# Patient Record
Sex: Male | Born: 1945 | Race: White | Hispanic: No | Marital: Married | Smoking: Former smoker
Health system: Southern US, Community
[De-identification: ages and names within clinical notes are randomized; demographics above are authoritative.]

## PROBLEM LIST (undated history)

## (undated) DIAGNOSIS — K589 Irritable bowel syndrome without diarrhea: Secondary | ICD-10-CM

## (undated) DIAGNOSIS — D649 Anemia, unspecified: Secondary | ICD-10-CM

## (undated) DIAGNOSIS — M9902 Segmental and somatic dysfunction of thoracic region: Secondary | ICD-10-CM

## (undated) DIAGNOSIS — M545 Low back pain, unspecified: Secondary | ICD-10-CM

## (undated) DIAGNOSIS — IMO0001 Reserved for inherently not codable concepts without codable children: Secondary | ICD-10-CM

## (undated) DIAGNOSIS — K219 Gastro-esophageal reflux disease without esophagitis: Secondary | ICD-10-CM

## (undated) DIAGNOSIS — Z531 Procedure and treatment not carried out because of patient's decision for reasons of belief and group pressure: Secondary | ICD-10-CM

## (undated) DIAGNOSIS — C20 Malignant neoplasm of rectum: Secondary | ICD-10-CM

## (undated) DIAGNOSIS — M542 Cervicalgia: Secondary | ICD-10-CM

## (undated) DIAGNOSIS — E785 Hyperlipidemia, unspecified: Secondary | ICD-10-CM

## (undated) DIAGNOSIS — K8020 Calculus of gallbladder without cholecystitis without obstruction: Secondary | ICD-10-CM

## (undated) DIAGNOSIS — R319 Hematuria, unspecified: Secondary | ICD-10-CM

## (undated) DIAGNOSIS — E0590 Thyrotoxicosis, unspecified without thyrotoxic crisis or storm: Secondary | ICD-10-CM

## (undated) DIAGNOSIS — Z9289 Personal history of other medical treatment: Secondary | ICD-10-CM

## (undated) DIAGNOSIS — M9903 Segmental and somatic dysfunction of lumbar region: Secondary | ICD-10-CM

## (undated) DIAGNOSIS — S39013A Strain of muscle, fascia and tendon of pelvis, initial encounter: Secondary | ICD-10-CM

## (undated) DIAGNOSIS — M1990 Unspecified osteoarthritis, unspecified site: Secondary | ICD-10-CM

## (undated) DIAGNOSIS — M47816 Spondylosis without myelopathy or radiculopathy, lumbar region: Secondary | ICD-10-CM

## (undated) DIAGNOSIS — H3530 Unspecified macular degeneration: Secondary | ICD-10-CM

## (undated) DIAGNOSIS — T7840XA Allergy, unspecified, initial encounter: Secondary | ICD-10-CM

## (undated) DIAGNOSIS — F3289 Other specified depressive episodes: Secondary | ICD-10-CM

## (undated) DIAGNOSIS — M9901 Segmental and somatic dysfunction of cervical region: Secondary | ICD-10-CM

## (undated) DIAGNOSIS — T8859XA Other complications of anesthesia, initial encounter: Secondary | ICD-10-CM

## (undated) DIAGNOSIS — E162 Hypoglycemia, unspecified: Secondary | ICD-10-CM

## (undated) DIAGNOSIS — R972 Elevated prostate specific antigen [PSA]: Secondary | ICD-10-CM

## (undated) DIAGNOSIS — T4145XA Adverse effect of unspecified anesthetic, initial encounter: Secondary | ICD-10-CM

## (undated) DIAGNOSIS — I10 Essential (primary) hypertension: Secondary | ICD-10-CM

## (undated) DIAGNOSIS — K5790 Diverticulosis of intestine, part unspecified, without perforation or abscess without bleeding: Secondary | ICD-10-CM

## (undated) DIAGNOSIS — Z8669 Personal history of other diseases of the nervous system and sense organs: Secondary | ICD-10-CM

## (undated) DIAGNOSIS — F329 Major depressive disorder, single episode, unspecified: Secondary | ICD-10-CM

## (undated) DIAGNOSIS — K4090 Unilateral inguinal hernia, without obstruction or gangrene, not specified as recurrent: Secondary | ICD-10-CM

## (undated) DIAGNOSIS — M9904 Segmental and somatic dysfunction of sacral region: Secondary | ICD-10-CM

## (undated) DIAGNOSIS — A159 Respiratory tuberculosis unspecified: Secondary | ICD-10-CM

## (undated) DIAGNOSIS — N281 Cyst of kidney, acquired: Secondary | ICD-10-CM

## (undated) DIAGNOSIS — H269 Unspecified cataract: Secondary | ICD-10-CM

## (undated) DIAGNOSIS — F419 Anxiety disorder, unspecified: Secondary | ICD-10-CM

## (undated) DIAGNOSIS — N419 Inflammatory disease of prostate, unspecified: Secondary | ICD-10-CM

## (undated) DIAGNOSIS — E278 Other specified disorders of adrenal gland: Secondary | ICD-10-CM

## (undated) HISTORY — DX: Segmental and somatic dysfunction of cervical region: M99.01

## (undated) HISTORY — DX: Unilateral inguinal hernia, without obstruction or gangrene, not specified as recurrent: K40.90

## (undated) HISTORY — DX: Low back pain, unspecified: M54.50

## (undated) HISTORY — DX: Other specified depressive episodes: F32.89

## (undated) HISTORY — DX: Strain of muscle, fascia and tendon of pelvis, initial encounter: S39.013A

## (undated) HISTORY — DX: Segmental and somatic dysfunction of thoracic region: M99.02

## (undated) HISTORY — DX: Irritable bowel syndrome, unspecified: K58.9

## (undated) HISTORY — DX: Segmental and somatic dysfunction of lumbar region: M99.03

## (undated) HISTORY — DX: Elevated prostate specific antigen (PSA): R97.20

## (undated) HISTORY — DX: Respiratory tuberculosis unspecified: A15.9

## (undated) HISTORY — DX: Unspecified osteoarthritis, unspecified site: M19.90

## (undated) HISTORY — DX: Anxiety disorder, unspecified: F41.9

## (undated) HISTORY — DX: Major depressive disorder, single episode, unspecified: F32.9

## (undated) HISTORY — DX: Other specified disorders of adrenal gland: E27.8

## (undated) HISTORY — PX: CYSTOSCOPY: SUR368

## (undated) HISTORY — PX: CATARACT EXTRACTION, BILATERAL: SHX1313

## (undated) HISTORY — DX: Allergy, unspecified, initial encounter: T78.40XA

## (undated) HISTORY — DX: Personal history of other diseases of the nervous system and sense organs: Z86.69

## (undated) HISTORY — DX: Essential (primary) hypertension: I10

## (undated) HISTORY — DX: Hyperlipidemia, unspecified: E78.5

## (undated) HISTORY — DX: Inflammatory disease of prostate, unspecified: N41.9

## (undated) HISTORY — DX: Segmental and somatic dysfunction of sacral region: M99.04

## (undated) HISTORY — DX: Unspecified macular degeneration: H35.30

## (undated) HISTORY — DX: Hematuria, unspecified: R31.9

## (undated) HISTORY — DX: Spondylosis without myelopathy or radiculopathy, lumbar region: M47.816

## (undated) HISTORY — DX: Cyst of kidney, acquired: N28.1

## (undated) HISTORY — PX: TONSILLECTOMY: SHX5217

## (undated) HISTORY — DX: Calculus of gallbladder without cholecystitis without obstruction: K80.20

## (undated) HISTORY — DX: Diverticulosis of intestine, part unspecified, without perforation or abscess without bleeding: K57.90

## (undated) HISTORY — DX: Cervicalgia: M54.2

## (undated) HISTORY — DX: Unspecified cataract: H26.9

---
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
## 1992-07-15 HISTORY — PX: KNEE ARTHROSCOPY: SUR90

## 1999-06-05 ENCOUNTER — Encounter: Payer: Self-pay | Admitting: Internal Medicine

## 1999-06-05 ENCOUNTER — Ambulatory Visit (HOSPITAL_COMMUNITY): Admission: RE | Admit: 1999-06-05 | Discharge: 1999-06-05 | Payer: Self-pay | Admitting: Internal Medicine

## 2000-03-26 ENCOUNTER — Inpatient Hospital Stay (HOSPITAL_COMMUNITY): Admission: RE | Admit: 2000-03-26 | Discharge: 2000-03-29 | Payer: Self-pay | Admitting: Urology

## 2000-03-26 ENCOUNTER — Encounter (INDEPENDENT_AMBULATORY_CARE_PROVIDER_SITE_OTHER): Payer: Self-pay

## 2001-07-15 HISTORY — PX: OTHER SURGICAL HISTORY: SHX169

## 2004-08-20 ENCOUNTER — Ambulatory Visit: Payer: Self-pay | Admitting: Internal Medicine

## 2005-01-11 ENCOUNTER — Ambulatory Visit: Payer: Self-pay | Admitting: Internal Medicine

## 2005-01-17 ENCOUNTER — Ambulatory Visit: Payer: Self-pay | Admitting: Internal Medicine

## 2005-01-31 ENCOUNTER — Ambulatory Visit: Payer: Self-pay | Admitting: Internal Medicine

## 2006-07-02 ENCOUNTER — Ambulatory Visit: Payer: Self-pay | Admitting: Internal Medicine

## 2006-07-02 ENCOUNTER — Encounter (INDEPENDENT_AMBULATORY_CARE_PROVIDER_SITE_OTHER): Payer: Self-pay | Admitting: *Deleted

## 2006-07-02 LAB — CONVERTED CEMR LAB
Chol/HDL Ratio, serum: 5.5
Cholesterol: 287 mg/dL (ref 0–200)
HDL: 52.6 mg/dL (ref >39.0)
Hgb A1c MFr Bld: 5.3 % (ref 4.6–6.0)
LDL DIRECT: 231.9 mg/dL
PSA: 4.76 ng/mL
PSA: 4.76 ng/mL — ABNORMAL HIGH (ref 0.10–4.00)
Triglyceride fasting, serum: 75 mg/dL (ref 0–149)
VLDL: 15 mg/dL (ref 0–40)

## 2006-07-15 DIAGNOSIS — R972 Elevated prostate specific antigen [PSA]: Secondary | ICD-10-CM

## 2006-07-15 DIAGNOSIS — N419 Inflammatory disease of prostate, unspecified: Secondary | ICD-10-CM

## 2006-07-15 HISTORY — DX: Inflammatory disease of prostate, unspecified: N41.9

## 2006-07-15 HISTORY — DX: Elevated prostate specific antigen (PSA): R97.20

## 2006-07-16 ENCOUNTER — Ambulatory Visit: Payer: Self-pay

## 2006-07-31 ENCOUNTER — Ambulatory Visit: Payer: Self-pay | Admitting: Internal Medicine

## 2006-10-07 ENCOUNTER — Ambulatory Visit: Payer: Self-pay | Admitting: Internal Medicine

## 2006-10-07 LAB — CONVERTED CEMR LAB
ALT: 18 units/L (ref 0–40)
AST: 20 units/L (ref 0–37)
Cholesterol: 266 mg/dL (ref 0–200)
Direct LDL: 202.7 mg/dL
HDL: 49.8 mg/dL (ref >39.0)
Total CHOL/HDL Ratio: 5.3
Triglycerides: 89 mg/dL (ref 0–149)
VLDL: 18 mg/dL (ref 0–40)

## 2007-06-29 ENCOUNTER — Encounter (INDEPENDENT_AMBULATORY_CARE_PROVIDER_SITE_OTHER): Payer: Self-pay | Admitting: *Deleted

## 2007-06-29 DIAGNOSIS — M1990 Unspecified osteoarthritis, unspecified site: Secondary | ICD-10-CM | POA: Insufficient documentation

## 2007-06-29 DIAGNOSIS — Z87448 Personal history of other diseases of urinary system: Secondary | ICD-10-CM

## 2007-06-29 DIAGNOSIS — H3530 Unspecified macular degeneration: Secondary | ICD-10-CM | POA: Insufficient documentation

## 2007-06-29 DIAGNOSIS — E785 Hyperlipidemia, unspecified: Secondary | ICD-10-CM | POA: Insufficient documentation

## 2007-06-29 DIAGNOSIS — R972 Elevated prostate specific antigen [PSA]: Secondary | ICD-10-CM

## 2007-06-29 DIAGNOSIS — F329 Major depressive disorder, single episode, unspecified: Secondary | ICD-10-CM

## 2007-07-16 HISTORY — PX: TOTAL KNEE ARTHROPLASTY: SHX125

## 2007-10-07 ENCOUNTER — Telehealth (INDEPENDENT_AMBULATORY_CARE_PROVIDER_SITE_OTHER): Payer: Self-pay | Admitting: *Deleted

## 2007-10-15 ENCOUNTER — Ambulatory Visit: Payer: Self-pay | Admitting: Internal Medicine

## 2007-10-15 DIAGNOSIS — F411 Generalized anxiety disorder: Secondary | ICD-10-CM

## 2007-11-24 ENCOUNTER — Ambulatory Visit: Payer: Self-pay | Admitting: Internal Medicine

## 2007-11-24 DIAGNOSIS — J301 Allergic rhinitis due to pollen: Secondary | ICD-10-CM

## 2008-09-21 ENCOUNTER — Encounter: Payer: Self-pay | Admitting: Internal Medicine

## 2009-02-01 ENCOUNTER — Ambulatory Visit: Payer: Self-pay | Admitting: Internal Medicine

## 2009-05-08 ENCOUNTER — Ambulatory Visit: Payer: Self-pay | Admitting: Internal Medicine

## 2009-05-08 DIAGNOSIS — L0390 Cellulitis, unspecified: Secondary | ICD-10-CM

## 2009-05-08 DIAGNOSIS — L0291 Cutaneous abscess, unspecified: Secondary | ICD-10-CM

## 2009-08-17 ENCOUNTER — Ambulatory Visit: Payer: Self-pay | Admitting: Internal Medicine

## 2009-08-17 DIAGNOSIS — J069 Acute upper respiratory infection, unspecified: Secondary | ICD-10-CM | POA: Insufficient documentation

## 2009-08-17 DIAGNOSIS — I10 Essential (primary) hypertension: Secondary | ICD-10-CM | POA: Insufficient documentation

## 2009-09-15 ENCOUNTER — Telehealth: Payer: Self-pay | Admitting: Internal Medicine

## 2009-12-20 ENCOUNTER — Ambulatory Visit: Payer: Self-pay | Admitting: Internal Medicine

## 2009-12-20 DIAGNOSIS — H6120 Impacted cerumen, unspecified ear: Secondary | ICD-10-CM

## 2009-12-20 DIAGNOSIS — H906 Mixed conductive and sensorineural hearing loss, bilateral: Secondary | ICD-10-CM | POA: Insufficient documentation

## 2010-01-10 ENCOUNTER — Telehealth (INDEPENDENT_AMBULATORY_CARE_PROVIDER_SITE_OTHER): Payer: Self-pay | Admitting: *Deleted

## 2010-04-18 ENCOUNTER — Telehealth (INDEPENDENT_AMBULATORY_CARE_PROVIDER_SITE_OTHER): Payer: Self-pay | Admitting: *Deleted

## 2010-04-19 ENCOUNTER — Encounter (INDEPENDENT_AMBULATORY_CARE_PROVIDER_SITE_OTHER): Payer: Self-pay | Admitting: *Deleted

## 2010-04-20 ENCOUNTER — Telehealth (INDEPENDENT_AMBULATORY_CARE_PROVIDER_SITE_OTHER): Payer: Self-pay | Admitting: *Deleted

## 2010-07-15 HISTORY — PX: COLONOSCOPY: SHX174

## 2010-08-14 NOTE — Progress Notes (Signed)
Summary: Refill Request  Phone Note Refill Request Call back at [phone] Message from:  Pharmacy on April 20, 2010 10:34 AM  Refills Requested: Medication #1:  AMLODIPINE BESYLATE 5 MG TABS 1 once daily   Dosage confirmed as above?Dosage Confirmed   Brand Name Necessary? No   Supply Requested: 1 month   Last Refilled: 03/13/2010 [facility] [address] [facility] [place]  Next Appointment Scheduled: none Initial call taken by: Harold Barban,  April 20, 2010 10:35 AM    Prescriptions: AMLODIPINE BESYLATE 5 MG TABS (AMLODIPINE BESYLATE) 1 once daily  #30 x 5   Entered by:   Shonna Chock CMA   Authorized by:   Marga Melnick MD   Signed by:   Shonna Chock CMA on 04/20/2010   Method used:   Electronically to        [facility] [phone]* (retail)       [address] [place], [place]  [address]       Ph: [phone]       Fax: [phone]   RxID:   [id number]

## 2010-08-14 NOTE — Assessment & Plan Note (Signed)
Summary: ACUTE/SINUS/ALR  NS ALR   Vital Signs:  Patient profile:   65 year old male Weight:      204.6 pounds Temp:     98.4 degrees F oral Pulse rate:   76 / minute Resp:     16 per minute BP sitting:   140 / 78  (left arm) Cuff size:   large  Vitals Entered By: Shonna Chock (February 01, 2009 1:15 PM) CC: SEEN SAT AT URGENT CARE(OUT OF TOWN) DX WITH BRONCHITIS. PATIENT TOOK LAST ABX TODAY BUT NOT AT 100% Comments REVIEWED MED LIST, PATIENT AGREED DOSE AND INSTRUCTION CORRECT    CC:  SEEN SAT AT URGENT CARE(OUT OF TOWN) DX WITH BRONCHITIS. PATIENT TOOK LAST ABX TODAY BUT NOT AT 100%.  History of Present Illness: Dry cough & ST 01/24/2009 followed by chest & head  congestion. He was seen 7/17; Zithromax Rxed for sinusitis & bronchitis. Now improved but congestion persists.  Allergies: 1)  ! Zoloft 2)  ! Celexa  Review of Systems General:  Denies chills, fever, and sweats. ENT:  Denies earache, nasal congestion, and sinus pressure; No frontal headache, facial pain or purulence.[name] Resp:  Complains of sputum productive and wheezing; denies chest pain with inspiration, coughing up blood, and shortness of breath; Light green sputum; slight wheezing. No PMH of asthma ; non smoker.  Physical Exam  General:  in no acute distress; alert,appropriate and cooperative throughout examination Ears:  External ear exam shows no significant lesions or deformities.  Otoscopic examination reveals  wax bilaterally. Hearing is grossly normal bilaterally. Nose:  External nasal examination shows no deformity or inflammation. Nasal mucosa are pink and moist without lesions or exudates. Mouth:  Oral mucosa and oropharynx without lesions or exudates.  Teeth in good repair. mild  pharyngeal erythema.   Lungs:  Normal respiratory effort, chest expands symmetrically. Lungs are clear to auscultation, no crackles or wheezes. Cervical Nodes:  No lymphadenopathy noted Axillary Nodes:  No palpable  lymphadenopathy   Impression & Recommendations:  Problem # 1:  BRONCHITIS-ACUTE (ICD-466.0)  The following medications were removed from the medication list:    Tussionex Pennkinetic Er 8-10 Mg/72ml Lqcr (Chlorpheniramine-hydrocodone) .[name]... 1tsp at bedtime as needed for cough His updated medication list for this problem includes:    Levaquin 500 Mg Tabs (Levofloxacin) .[name]... 1 once daily    Promethazine-codeine 6.25-10 Mg/53ml Syrp (Promethazine-codeine) .[name]... 1 tsp q 6 hrs as needed cough  Complete Medication List: 1)  Omega Three  .... Daily 2)  Coq10  .... Daily 3)  B Vits  .... Daily 4)  Garlic  .... Daily 5)  Levaquin 500 Mg Tabs (Levofloxacin) .[name].. 1 once daily 6)  Promethazine-codeine 6.25-10 Mg/20ml Syrp (Promethazine-codeine) .[name].. 1 tsp q 6 hrs as needed cough  Patient Instructions: 1)  Neti pot once daily as needed for any head congestion. 2)  Drink as much fluid as you can tolerate for the next few days. Prescriptions: PROMETHAZINE-CODEINE 6.25-10 MG/5ML SYRP (PROMETHAZINE-CODEINE) 1 tsp q 6 hrs as needed cough  #120cc x 0   Entered and Authorized by:   Marga Melnick MD   Signed by:   Marga Melnick MD on 02/01/2009   Method used:   Print then Give to Patient   RxID:   [id number] LEVAQUIN 500 MG TABS (LEVOFLOXACIN) 1 once daily  #5 x 0   Entered and Authorized by:   Marga Melnick MD   Signed by:   Marga Melnick MD on 02/01/2009   Method used:  Print then Give to Patient   RxID:   [id number]

## 2010-08-14 NOTE — Progress Notes (Signed)
Summary: -Note for Jury Duty  Phone Note Call from Patient   Caller: Patient Reason for Call: Lab or Test Results Summary of Call: Pt requesting a letter for jury duty. Pt states that in the pass he was Dx with bipolar and does not feel that he would be able to make good decision based upon this DX. Pt also states that he has served on jury duty in the pass and has no problem with serving but does not feel that he is capable of serving now.............Marland KitchenFelecia Deloach CMA  April 18, 2010 3:18 PM    Follow-up for Phone Call        left message to call office per dr hopper is pt currently taking any med for dx of bipolar and who dx pt with this not on current problem list...............Marland KitchenFelecia Deloach CMA  April 18, 2010 4:46 PM   Additional Follow-up for Phone Call Additional follow up Details #1::        Patient called back and left message on voicemail stating he is sorry  that he  missed our call but would like a call back.   I called patient and he said it was a few years ago that  Dr.Hopper told him he was Bi-Polar, patient said he sat down and answered about 10 questions and thats when the diagonosis was made. Patient said he has a hard problem connecting the dots and he doesnt feel its a good idea for him to be on a jury. Patient was informed Dr.Hopper is out of the office and will respond on Monday, patient ok'd Additional Follow-up by: Shonna Chock CMA,  April 19, 2010 2:28 PM    Additional Follow-up for Phone Call Additional follow up Details #2::    To Whom It May Concern:  [name]  has a past history of anxiety & depression. Screening has suggested the possibility of a mood disorder. He does not feel he could serve effectively on a  jury. W.F. Alwyn Ren , M.D. Follow-up by: Marga Melnick MD,  April 19, 2010 4:03 PM  Additional Follow-up for Phone Call Additional follow up Details #3:: Details for Additional Follow-up Action Taken: I left message on patient's cell  informing him Dr.Hopper is out of the office but has approved letter and it will be avaliable Monday after 12pm, I need Dr.Hopper to sign when he returns on Monday./[facility] CMA  April 20, 2010 2:38 PM

## 2010-08-14 NOTE — Progress Notes (Signed)
Summary: BLOOD PRESSURE STILL HIGH  Phone Note Call from Patient Call back at CELL = [phone]   Caller: Patient Summary of Call: PATIENT STARTED TAKING AMLODIPINE BESYLATE AROUND 08/17/2009---SAYS HE HAS BEEN SEEING HIS CHIROPRACTOR EVERY WEEK AND HIS BLOOD PRESSURE IS ALWAYS HIGH---TODAY IT WAS 170/90  SINCE HE HAS HIS BLOOD PRESSURE TAKEN AT [facility], HE HAS NOT BEEN TAKING IT AT HOME  WHAT SHOULD HE DO NOW---COME IN FOR ANOTHER APPOPINTMENT? OR NEEDS DIFFERENT MEDICATION?     OR???  PLEASE CALL HIM ON HIS CELL AT [phone]    OK TO LEAVE MESSAGE IF HE DOES NOT ANSWER Initial call taken by: Jerolyn Shin,  September 15, 2009 10:01 AM  Follow-up for Phone Call        pt c/o elevated BP 170/90. pt denies any symptoms as of now. [facility]...................Marland KitchenFelecia Deloach CMA  September 15, 2009 10:27 AM   Additional Follow-up for Phone Call Additional follow up Details #1::        Add Benazepril 20 mg #30, RX2 to Amlodipine ; BP goal =< 140/90     Additional Follow-up for Phone Call Additional follow up Details #2::    pt aware, f/u OV need it BP is still not regulated..............Marland KitchenFelecia Deloach CMA  September 15, 2009 11:37 AM   New/Updated Medications: BENAZEPRIL HCL 20 MG TABS (BENAZEPRIL HCL) Take 1 tab once daily Prescriptions: BENAZEPRIL HCL 20 MG TABS (BENAZEPRIL HCL) Take 1 tab once daily  #30 x 2   Entered by:   Jeremy Johann CMA   Authorized by:   Marga Melnick MD   Signed by:   Jeremy Johann CMA on 09/15/2009   Method used:   Faxed to ...       [facility] [phone]* (retail)       [address] [place], [place]  [address]       Ph: [phone]       Fax: [phone]   RxID:   [id number]

## 2010-08-14 NOTE — Assessment & Plan Note (Signed)
Summary: "crup"//lh   Vital Signs:  Patient profile:   65 year old male Weight:      206.6 pounds Temp:     97.8 degrees F oral Pulse rate:   82 / minute Resp:     15 per minute BP sitting:   184 / 96  (right arm)  Vitals Entered By: Doristine Devoid (August 17, 2009 1:55 PM) CC: sinus congestion and cough x1 week- has taken mucinex w/ little relief , URI symptoms   CC:  sinus congestion and cough x1 week- has taken mucinex w/ little relief  and URI symptoms.  History of Present Illness:  URI Symptoms      This is a 65 year old man who presents with URI symptoms since 08/13/2009 as loose stool that am followed by ST.  The patient reports nasal congestion, purulent nasal discharge, sore throat, productive cough, and exposure  sick contacts, but denies earache.  The patient denies fever, stiff neck, dyspnea, wheezing, rash, vomiting, or  diarrhea.  The patient also reports itchy watery eyes, sneezing, and severe fatigue.  The patient denies headache and muscle aches.  The patient denies the following risk factors for Strep sinusitis: unilateral facial pain, unilateral nasal discharge, tooth pain, Strep exposure, and tender adenopathy.  Rx: Mucinex(denies Mucinex D).HTN @ Chiropracter's appt last month; no PMH of HTN. Recheck BP 160/95.  Allergies: 1)  ! Zoloft 2)  ! Celexa  Past History:  Past Medical History: DEGENERATIVE JOINT DISEASE (ICD-715.90) MACULAR DEGENERATION (ICD-362.50)? PROSTATITIS, HX OF (ICD-V13.09) ELEVATED PROSTATE SPECIFIC ANTIGEN (ICD-790.93), Dr Annabell Howells HYPERLIPIDEMIA (ICD-272.4) DEPRESSION (ICD-311)  Past Surgical History: Tonsillectomy Hydrocodectomy and prostatic nodulectomy, hematuria--neg. cystoscopy, scan  Review of Systems Eyes:  Denies blurring, double vision, and vision loss-both eyes. ENT:  Denies nosebleeds. CV:  Denies chest pain or discomfort, leg cramps with exertion, palpitations, swelling of feet, and swelling of hands. MS:  Complains of  joint pain; denies joint redness and joint swelling; Neck, hip & R shoulder pain treated 3X/week by Dr Mayford Knife. Neuro:  Denies headaches, numbness, and tingling.  Physical Exam  General:  in no acute distress; alert,appropriate and cooperative throughout examination Ears:  External ear exam shows no significant lesions or deformities.  Otoscopic examination reveals clear canals, tympanic membranes are intact bilaterally without bulging, retraction, inflammation or discharge. Hearing is grossly normal bilaterally. Nose:  External nasal examination shows no deformity or inflammation. Nasal mucosa are pink and moist without lesions or exudates. Mouth:  Oral mucosa and oropharynx without lesions or exudates.  Teeth in good repair. Lungs:  Normal respiratory effort, chest expands symmetrically. Lungs are clear to auscultation, no crackles or wheezes. Heart:  Normal rate and regular rhythm. S1 and S2 normal without gallop, murmur, click, rub . S4 Abdomen:  Bowel sounds positive,abdomen soft and non-tender without masses, organomegaly or hernias noted. No AAA or bruits Pulses:  R and L carotid,radial,dorsalis pedis and posterior tibial pulses are full and equal bilaterally Extremities:  No clubbing, cyanosis, edema. Cervical Nodes:  No lymphadenopathy noted Axillary Nodes:  No palpable lymphadenopathy   Impression & Recommendations:  Problem # 1:  BRONCHITIS-ACUTE (ICD-466.0)  The following medications were removed from the medication list:    Amoxicillin-pot Clavulanate 500-125 Mg Tabs (Amoxicillin-pot clavulanate) .[name]... 1 q 12 hrs with food His updated medication list for this problem includes:    Cefuroxime Axetil 500 Mg Tabs (Cefuroxime axetil) .[name]... 1 two times a day  Problem # 2:  URI (ICD-465.9)  Problem # 3:  ELEVATED BLOOD PRESSURE WITHOUT DIAGNOSIS OF HYPERTENSION (ICD-796.2)  His updated medication list for this problem includes:    Amlodipine Besylate 5 Mg Tabs (Amlodipine  besylate) .[name]... 1 once daily  Complete Medication List: 1)  Omega Three  .... Daily 2)  B Vits  .... Daily 3)  Garlic  .... Daily 4)  Vitamin C Plus 500 Mg Tabs (Bioflavonoid products) .[name].. 1 by mouth once daily 5)  Cefuroxime Axetil 500 Mg Tabs (Cefuroxime axetil) .[name].. 1 two times a day 6)  Amlodipine Besylate 5 Mg Tabs (Amlodipine besylate) .[name].. 1 once daily  Patient Instructions: 1)  Check your Blood Pressure regularly. Yor goal = AVERAGE < 135/85.Avoid stimulants ,especially decongestants. 2)  Drink as much fluid as you can tolerate for the next few days. Prescriptions: AMLODIPINE BESYLATE 5 MG TABS (AMLODIPINE BESYLATE) 1 once daily  #30 x 5   Entered and Authorized by:   Marga Melnick MD   Signed by:   Marga Melnick MD on 08/17/2009   Method used:   Faxed to ...       [facility] [phone]* (retail)       [address] [place], [place]  [address]       Ph: [phone]       Fax: [phone]   RxID:   [id number] CEFUROXIME AXETIL 500 MG TABS (CEFUROXIME AXETIL) 1 two times a day  #20 x 0   Entered and Authorized by:   Marga Melnick MD   Signed by:   Marga Melnick MD on 08/17/2009   Method used:   Faxed to ...       [facility] [phone]* (retail)       [address] [place], [place]  [address]       Ph: [phone]       Fax: [phone]   RxID:   [id number]

## 2010-08-14 NOTE — Letter (Signed)
Summary: Generic Letter  [facility]  [address] [place], [place] [address]   Phone: [phone]  Fax: [phone]      04/19/2010  Jaedon Wheeler [address], [place]  [address]    To Whom It May Concern:  Mr. Caio Devera  has a past history of anxiety & depression. Screening has suggested the possibility of a mood disorder. He does not feel he could serve effectively on a  jury.         Sincerely,   Chrae Malloy CMA W.F. Alwyn Ren , M.D.

## 2010-08-14 NOTE — Progress Notes (Signed)
Summary: Refill Request  Phone Note Refill Request Call back at [phone] Message from:  Pharmacy on January 10, 2010 3:10 PM  Refills Requested: Medication #1:  BENAZEPRIL HCL 20 MG TABS Take 1 tab once daily.   Dosage confirmed as above?Dosage Confirmed   Brand Name Necessary? No   Supply Requested: 1 month [facility] [address] [facility] [place]  Next Appointment Scheduled: none Initial call taken by: Harold Barban,  January 10, 2010 3:10 PM    Prescriptions: BENAZEPRIL HCL 20 MG TABS (BENAZEPRIL HCL) Take 1 tab once daily  #30 x 5   Entered by:   Shonna Chock   Authorized by:   Marga Melnick MD   Signed by:   Shonna Chock on 01/10/2010   Method used:   Electronically to        [facility] [phone]* (retail)       [address] [place], [place]  [address]       Ph: [phone]       Fax: [phone]   RxID:   [id number]

## 2010-08-14 NOTE — Assessment & Plan Note (Signed)
Summary: clogged ears//lch   Vital Signs:  Patient profile:   65 year old male Weight:      208 pounds Temp:     98.7 degrees F oral Pulse rate:   64 / minute Resp:     16 per minute BP sitting:   118 / 70  (left arm) Cuff size:   large  Vitals Entered By: Shonna Chock (December 20, 2009 10:44 AM) CC: Clogged Ears X 1.5 week Comments REVIEWED MED LIST, PATIENT AGREED DOSE AND INSTRUCTION CORRECT    CC:  Clogged Ears X 1.5 week.  History of Present Illness: He has chronic  hearing loss for > 30 years , it became worse  after use of Carbamide peroxide . No Audiology evaluation to date.  Allergies: 1)  ! Zoloft 2)  ! Celexa  Review of Systems General:  Denies chills, fever, and sweats. ENT:  Denies ear discharge, earache, nasal congestion, ringing in ears, and sinus pressure; No purulence.  Physical Exam  General:  in no acute distress; alert,appropriate and cooperative throughout examination Ears:  Impactions bilaterally, R > L; TMs normal after softener ,soaking & gavage Nose:  External nasal examination shows no deformity or inflammation. Nasal mucosa are pink and moist without lesions or exudates. Mouth:  Oral mucosa and oropharynx without lesions or exudates.  Teeth in good repair. 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 Daily 2)  B Vits  .... Daily 3)  Garlic  .... Daily 4)  Vitamin C Plus 500 Mg Tabs (Bioflavonoid products) .[name].. 1 by mouth once daily 5)  Amlodipine Besylate 5 Mg Tabs (Amlodipine besylate) .[name].. 1 once daily 6)  Benazepril Hcl 20 Mg Tabs (Benazepril hcl) .... Take 1 tab once daily  Patient Instructions: 1)  No Q Tips ; ear hygiene as discussed: mineral oil 3 drops at  bedtime with cotton ball; hydrogen peroxide into ear X 15 min in am ; then shower . Cleanse ear with thin washrag

## 2010-11-05 ENCOUNTER — Other Ambulatory Visit: Payer: Self-pay | Admitting: *Deleted

## 2010-11-05 MED ORDER — BENAZEPRIL HCL 20 MG PO TABS: 20.0000 mg | ORAL_TABLET | Freq: Every day | ORAL | Status: DC

## 2010-11-30 NOTE — Letter (Signed)
July 10, 2006    Amparo Bristol. Robin Searing, MD  [address]  [place], [place]  [address]   RE:  Nathan [name], Nathan [name]  MRN:  [id number]  /  DOB:  05-23-1946   Dear Dr. Robin Searing,   Thank you for referring Mr. Mayer Camel for preoperative clearance concerning  his left knee replacement which is scheduled for July 21, 2006.   Unfortunately, I do not feel I can clear him from a medical stand point  at this time, because of uncontrolled dyslipidemia with at least a 25%  risk of premature heart attack or stroke.  He has been noncompliant in  medical recommendations to treat this risk.   Unfortunately, he is obviously unable to exercise because of the pain in  the knee with walking.  This is an intermittent phenomenon, but he  states that he has pain 50-60% of days.  The Cortisone shot did provide  partial relief.   Other than the orthopedic issues with severe crepitus, there were no  significant findings on exam.  Specifically, the coronary pulmonary exam  was surprisingly negative.  His EKG showed nonspecific changes in I and  aVL with some loss of P voltage  in the lateral chest leads as well.  A  preoperative nuclear stress test is recommended and apparently that will  be scheduled through your office.   His repeat total cholesterol was 287 and LDL was 232.  Again this will  be associated with phenomenal risk as there has been no intervention.  I  had previously recommended Vytorin 10/40 but the prescription was not  refilled after the samples were employed.   His hemoglobin A1c indicates there is no presence of diabetes.   His PSA is 4.76 up from the value of 4.49 in July 2006. At that time I  had referred him to Dr. Bjorn Pippin, urologist for the elevated PSA which  has continued to climb.  I will encourage him to see Dr. Annabell Howells to  evaluate the elevated PSA.   He had a prostate nodule removed in 2003, which was benign.  He has also  had arthroscopy on the left knee in 1994,  tonsillectomy and  adenoidectomy remotely and hydrocelectomy in 2003 as well.  He has had  no perioperative complications.  He was also evaluated for hematuria,  with negative cystoscopy and scan findings.   He does have depression which he has been clinically responsive to  Wellbutrin SR 200 mg.  By history he has been intolerant or had  suboptimal results to two seratonin deficiency  therapies.   If the surgery is to be pursued then I would encourage a cardiology  preoperative evaluation either in [place] or here in [place] as  he prefers.    Sincerely,      Titus Dubin. Alwyn Ren, MD,FACP,FCCP  Electronically Signed    [facility]  DD: 07/10/2006  DT: 07/10/2006  Job #: [id number]

## 2010-11-30 NOTE — Op Note (Signed)
[place]  Patient:    Nathan [name], Nathan [name]                         MRN: [id number] Proc. Date: 03/26/00 Adm. Date:  [id number] Attending:  Evlyn Clines                           Operative Report  PREOPERATIVE DIAGNOSIS:  Left hydrocele and benign prostatic hypertrophy with retention.  POSTOPERATIVE DIAGNOSIS:  Left hydrocele and benign prostatic hypertrophy with retention.  PROCEDURE:  Left hydrocelectomy and transurethral resection of the prostate.  SURGEON:  Excell Seltzer. Annabell Howells, M.D.  ANESTHESIA:  General.  SPECIMEN:  Prostate tissue.  DRAINS:  A 22-French three-way Foley catheter and 1/4 inch Penrose drain.  COMPLICATIONS:  None.  INDICATIONS:  The patient is a 65 year old white male who has a history of BPH with outlet obstruction.  He had been on Flomax and despite doubling his dose, he had progressively increased difficulty urinating.  He came to my office earlier this week in retention with overflow voiding.  He also has a large symptomatic hydrocele.  After evaluation in the office, it was felt that TURP, and hydrocelectomy was the best option.  The patient was explained the risks and understood well that he is a Air traffic controller Witness, and we understand no blood can be given.  FINDINGS AND DESCRIPTION OF PROCEDURE:  The patient was taken to the operating room after receiving p.o. antibiotics.  The general anesthetic was induced. He was placed in the lithotomy position.  His scrotum was shaved.  The perineum and genitalia was prepped with Betadine solution.  He was draped in the usual sterile fashion.  An oblique left scrotal incision was made with the knife.  The dartos was incised with the Bovie.  The testicle within the hydrocele sac was delivered from the wound.  The hydrocele sac was opened and drained.  Some of the epithelium was stripped from the sac.  Some redundant tissue was excised from the sac and then the sac was imbricated  behind the testicle in a water bottle fashion with a running locked 3-0 chromic with great care being taken to obliterate dead space.  Once the reefing of the hydrocele sac had been performed, the wound was examined for hemostasis.  A few small bleeders were cauterized.  Once that was taken care of, a 1/4 inch Penrose drain was placed through a separate stab wound in the dependent portion of the scrotum and laid in along side the testicle and cord.  The scrotum was then closed in two layers using a running 3-0 chromic on the dartos muscle and interrupted vertical mattress 3-0 chromic on the skin.  The drain was secured with a 3-0 chromic stitch.  I then turned my attention to TURP.  The patients urethra was calibrated to 30-French with a slight _________ at the meatus and some bleeding.  A 28-French continuous flow resectoscope was inserted without difficulty and this was fitted into place using the handle at 24 loupe and a 12 degree lens. The patient had a very prominent middle lobe obstructing the bladder neck.  It was difficult to see the ureteral orifices due to the anatomy of this middle lobe.  The middle lobe was resected from bladder neck back to the verumontanum providing a channel.  The right lobe of the prostate was then resected from bladder neck to apex out to  the capsular fibers.  The left lobe was resected a similar fashion.  Some resection of the proximal anterior tissue was performed, but the apical anterior tissue was preserved.  The apical aspects of the lateral lobe were trimmed, but great care was taken to avoid injury to the sphincter mechanism.  Once the resection had been completed, the bladder was evacuated free of chips, and final hemostasis was achieved.  Of note, the patient did have fairly vascular prostate and there was a moderate amount of bleeding during the procedure, but at the completion of the procedure, inspection of the bladder revealed no retained  chips, intact ureteral orifices, and intact external sphincter, and no active bleeding.  The scope was removed.  Pressure on the bladder produced an excellent stream.  A 22-French three-way Foley catheter was then inserted with the aid of the catheter guide.  The balloon was filled with 30 cc of sterile fluid.  The catheter was placed on traction and hand irrigated until clear.  It was then placed to continuous irrigation and straight drainage.  The patients drapes were removed.  A dressing of 4 x 4s, fluff, Kerlix, and a scrotal support was applied to the scrotal wound.  The Foley was secured to _________ traction.  At this point, the patient was taken down from the lithotomy position, his anesthetic was reversed, and he was moved to the recovery room in stable condition, and there were no complications. DD:  03/26/00 TD:  03/28/00 Job: [id number]

## 2010-11-30 NOTE — Discharge Summary (Signed)
[place]  Patient:    Nathan [name], Nathan [name]                         MRN: [id number] Adm. Date:  [id number] Disc. Date: [id number] Attending:  Evlyn Clines                           Discharge Summary  BRIEF HISTORY:  Mr. Mayotte is a 65 year old white male with history of BPH and outlet obstruction and a symptomatic left hydrocele who is admitted for TURP and hydrocelectomy.  PAST MEDICAL HISTORY:  Pertinent for no allergies.  ADMISSION MEDICATIONS:  Flomax 0.4 mg q.h.s., Uro-Mag, and Ultram p.r.n.  For additional details of the History & Physical, see the health history assessment sheet and also as noted in the chart.  CLINICAL INFORMATION:  EKG: Normal sinus rhythm, normal EKG.  CBC and BMP within normal limits.  HOSPITAL COURSE: On the day of admission, the patient was taken to the operating room where he underwent a left hydrocelectomy and transurethral resection of prostate gland.  He was left with a 22-French through a Foley catheter and a scrotal Penrose drain.  He tolerated the procedure well without complications.  On the first postoperative day, his urine was clear.  His IV was decreased to [facility] for the PCA morphine pump.  His CBI was discontinued.  On the following day, his Foley was removed.  He was unable to void; Foley was replaced.  Drain was discontinued.  His PCA morphine pump and IV were discontinued.  On the third postoperative day, he was given a second voiding trial.  He was able to void and was felt ready for discharge home.  FINAL DIAGNOSIS:  Benign prostatic hypertrophy with outlet obstruction and a hydrocelectomy.  COMPLICATIONS:  His course was complicated by postoperative urinary retention.  DISCHARGE MEDICATIONS: 1. Tylox 1 to 2 p.o. q.4-6h. p.r.n. pain. 2. Bactrim DS 1 p.o. b.i.d.  FOLLOWUP:  He was instructed to follow up with me in two weeks.  DISPOSITION:  To home  CONDITION UPON DISCHARGE:  Improved,  prognosis good. DD:  04/24/00 TD:  04/26/00 Job: [id number]

## 2011-02-07 ENCOUNTER — Other Ambulatory Visit: Payer: Self-pay | Admitting: Internal Medicine

## 2011-02-07 MED ORDER — BENAZEPRIL HCL 20 MG PO TABS: 20.0000 mg | ORAL_TABLET | Freq: Every day | ORAL | Status: DC

## 2011-02-07 MED ORDER — AMLODIPINE BESYLATE 5 MG PO TABS: 5.0000 mg | ORAL_TABLET | Freq: Every day | ORAL | Status: DC

## 2011-02-07 NOTE — Telephone Encounter (Signed)
RX'S sent to pharmacy

## 2011-05-28 ENCOUNTER — Encounter: Payer: Self-pay | Admitting: Internal Medicine

## 2011-05-29 ENCOUNTER — Ambulatory Visit (INDEPENDENT_AMBULATORY_CARE_PROVIDER_SITE_OTHER): Payer: Self-pay | Admitting: Internal Medicine

## 2011-05-29 ENCOUNTER — Encounter: Payer: Self-pay | Admitting: Internal Medicine

## 2011-05-29 VITALS — BP 122/80 | HR 61 | Temp 98.4°F | Resp 12 | Ht 70.0 in | Wt 198.2 lb

## 2011-05-29 DIAGNOSIS — E785 Hyperlipidemia, unspecified: Secondary | ICD-10-CM

## 2011-05-29 DIAGNOSIS — R030 Elevated blood-pressure reading, without diagnosis of hypertension: Secondary | ICD-10-CM

## 2011-05-29 DIAGNOSIS — Z0000 Encounter for general adult medical examination without abnormal findings: Secondary | ICD-10-CM

## 2011-05-29 DIAGNOSIS — Z1211 Encounter for screening for malignant neoplasm of colon: Secondary | ICD-10-CM

## 2011-05-29 NOTE — Progress Notes (Signed)
Subjective:    Patient ID: JACOBB ALEN, male    DOB: 1945/12/25, 65 y.o.   MRN: [id number]  HPI Medicare Wellness Visit:  The following psychosocial & medical history were reviewed as required by Medicare.   Social history: caffeine: 2 cups / day of coffee , alcohol:  no ,  tobacco use : quit 1968  & exercise : see below.   Home & personal  safety / fall risk: no issues, activities of daily living: no limitations , seatbelt use : yes , and smoke alarm employment : yes .  Power of Attorney/Living Will status : in place  Vision ( as recorded per Nurse) & Hearing  evaluation :  Last Ophth exam 2008 (see PMH );hearing decreased to whisper L > R . Wall chart read @ 6 ft with lenses Orientation :oriented X3 , memory & recall :good,  math testing: good,and mood & affect : normal . Depression / anxiety: no issues Travel history : [place] this year , immunization status :multiple shots needed , transfusion history:  no, and preventive health surveillance ( colonoscopies, BMD , etc as per protocol/ SOC): no colonoscopy ; "I never bothered to do it". Standard of care reviewed. Dental care: seen every 12 mos . Chart reviewed &  Updated. Active issues reviewed & addressed.  Significantly he plans to move to  [place] of [place] permanently in mid-December       Review of Systems  HYPERTENSION: Disease Monitoring  Blood pressure range: 130/80 on average  Chest pain: no   Dyspnea: no   Claudication: no  Lightheadedness: no   Urinary frequency: no              Edema: no              Medication compliance: yes  Preventitive Healthcare:  Exercise: yes, stationary bike 3X / week < 30 min   Diet Pattern: heart healthy  Salt Restriction: no  He has a past medical history significant dyslipidemia. Based on advanced cholesterol testing his LDL goal would be less than 100. He has never taken statins, he states he wanted to "try a more natural approach"      Objective:   Physical Exam Gen.: Healthy  and well-nourished in appearance. Alert, appropriate and cooperative throughout exam. Head: Normocephalic without obvious abnormalities;  moustache  Eyes: No corneal or conjunctival inflammation noted. Pupils equal round reactive to light and accommodation. Fundal exam is benign without hemorrhages, exudate, papilledema. Extraocular motion intact. . Ears: External  ear exam reveals no significant lesions or deformities. Canals essentially  Clear; slight wax accumulation .TMs normal.  Nose: External nasal exam reveals no deformity or inflammation. Nasal mucosa are pink and moist. No lesions or exudates noted.  Mouth: Oral mucosa and oropharynx reveal no lesions or exudates. Teeth in good repair. Neck: No deformities, masses, or tenderness noted. Range of motion &. Thyroid normal. Lungs: Normal respiratory effort; chest expands symmetrically. Lungs are clear to auscultation without rales, wheezes, or increased work of breathing. Heart: Normal rate and rhythm. Normal S1 and S2. No gallop, click, or rub. S 4 w/o  murmur. Abdomen: Bowel sounds normal; abdomen soft and nontender. No masses, organomegaly or hernias noted. Genitalia/ DRE: F/U with Dr Annabell Howells recommended  Musculoskeletal/extremities: No deformity or scoliosis noted of  the thoracic or lumbar spine. No clubbing, cyanosis, edema, or deformity noted. Range of motion  normal .Tone & strength  normal.Joints normal. Nail health  good. Vascular: Carotid, radial artery, dorsalis pedis and  posterior tibial pulses are full and equal. No bruits present. Neurologic: Alert and oriented x3. Deep tendon reflexes symmetrical and normal.         Skin: Intact without suspicious lesions or rashes. Lymph: No cervical, axillary lymphadenopathy present. Psych: Mood and affect are normal. Normally interactive                                                                                          Assessment & Plan:  #1 Medicare Wellness Exam; criteria met ; data entered #2 Problem List reviewed ; Assessment/ Recommendations made   #3 urologic reevaluation is recommended as he has had an extensive urologic history.  #4 I've asked him to  consider a screening colonoscopy if  this can be arranged before he leaves for [place] in mid December. Plan: see Orders

## 2011-05-29 NOTE — Patient Instructions (Signed)
Preventive Health Care: Exercise at least 30-45 minutes a day,  3-4 days a week.  Eat a low-fat diet with lots of fruits and vegetables, up to 7-9 servings per day. Consume less than 40 grams of sugar per day from foods & drinks with High Fructose Corn Sugar as #1,2,3 or # 4 on label. Please  schedule fasting Labs : BMET,Lipids, hepatic panel, CBC & dif, TSH.  Please bring these instructions to that Lab appt.  

## 2011-05-31 ENCOUNTER — Encounter: Payer: Self-pay | Admitting: Internal Medicine

## 2011-06-04 ENCOUNTER — Other Ambulatory Visit: Payer: Self-pay | Admitting: Internal Medicine

## 2011-06-04 DIAGNOSIS — Z0000 Encounter for general adult medical examination without abnormal findings: Secondary | ICD-10-CM

## 2011-06-05 ENCOUNTER — Other Ambulatory Visit (INDEPENDENT_AMBULATORY_CARE_PROVIDER_SITE_OTHER): Payer: Medicare Other

## 2011-06-05 DIAGNOSIS — Z0000 Encounter for general adult medical examination without abnormal findings: Secondary | ICD-10-CM

## 2011-06-05 DIAGNOSIS — E785 Hyperlipidemia, unspecified: Secondary | ICD-10-CM

## 2011-06-05 LAB — CBC WITH DIFFERENTIAL/PLATELET
Basophils Relative: 0.5 % (ref 0.0–3.0)
Eosinophils Absolute: 0.1 10*3/uL (ref 0.0–0.7)
Lymphocytes Relative: 32.8 % (ref 12.0–46.0)
MCHC: 33.7 g/dL (ref 30.0–36.0)
MCV: 92.9 fl (ref 78.0–100.0)
Monocytes Absolute: 0.5 10*3/uL (ref 0.1–1.0)
Neutrophils Relative %: 57.8 % (ref 43.0–77.0)
Platelets: 279 10*3/uL (ref 150.0–400.0)
RBC: 4.91 Mil/uL (ref 4.22–5.81)
WBC: 7.5 10*3/uL (ref 4.5–10.5)

## 2011-06-05 LAB — BASIC METABOLIC PANEL
BUN: 13 mg/dL (ref 6–23)
Chloride: 105 mEq/L (ref 96–112)
Creatinine, Ser: 1.1 mg/dL (ref 0.4–1.5)
GFR: 74.42 mL/min (ref >60.00)

## 2011-06-05 LAB — HEPATIC FUNCTION PANEL
ALT: 23 U/L (ref 0–53)
AST: 21 U/L (ref 0–37)
Bilirubin, Direct: 0 mg/dL (ref 0.0–0.3)
Total Protein: 6.8 g/dL (ref 6.0–8.3)

## 2011-06-05 LAB — LIPID PANEL
HDL: 57.5 mg/dL (ref >39.00)
Triglycerides: 74 mg/dL (ref 0.0–149.0)

## 2011-06-05 LAB — LDL CHOLESTEROL, DIRECT: Direct LDL: 202.5 mg/dL

## 2011-06-05 NOTE — Progress Notes (Signed)
12  

## 2011-06-07 LAB — TSH: TSH: 1.74 u[IU]/mL (ref 0.35–5.50)

## 2011-06-18 ENCOUNTER — Ambulatory Visit (AMBULATORY_SURGERY_CENTER): Payer: Medicare Other | Admitting: *Deleted

## 2011-06-18 VITALS — Ht 70.0 in | Wt 200.0 lb

## 2011-06-18 DIAGNOSIS — Z1211 Encounter for screening for malignant neoplasm of colon: Secondary | ICD-10-CM

## 2011-06-18 MED ORDER — PEG-KCL-NACL-NASULF-NA ASC-C 100 G PO SOLR: ORAL | Status: DC

## 2011-06-21 ENCOUNTER — Other Ambulatory Visit: Payer: Self-pay | Admitting: Internal Medicine

## 2011-06-25 ENCOUNTER — Telehealth: Payer: Self-pay | Admitting: Internal Medicine

## 2011-06-25 NOTE — Telephone Encounter (Signed)
I have spoken to the pt- he will go ahead with the colonoscopy and we will then decide about further work up ( ??CT or sono)

## 2011-06-25 NOTE — Telephone Encounter (Signed)
Received a call from patient reporting that he has had a dull ache in his right side for the last 2 weeks. States he has had it before and it always went away but it is not going away this time. States one night last week, he had to sit up for about an hour because of the pain. He is due for a colonoscopy on 06/27/11 and thought he should let us know about this pain. He has never been seen at our office before and is due to move to [place] next week. States having the colonoscopy was his last thing to do prior to the move. He is going to call Dr. Alwyn Ren his PCP about this also. He wants to be sure he should start the prep and do the colonoscopy. Please, advise.

## 2011-06-26 ENCOUNTER — Ambulatory Visit: Payer: Medicare Other | Admitting: Internal Medicine

## 2011-06-27 ENCOUNTER — Ambulatory Visit (AMBULATORY_SURGERY_CENTER): Payer: Medicare Other | Admitting: Internal Medicine

## 2011-06-27 ENCOUNTER — Encounter: Payer: Self-pay | Admitting: Internal Medicine

## 2011-06-27 VITALS — BP 177/94 | HR 73 | Temp 97.5°F | Resp 20 | Ht 70.0 in | Wt 200.0 lb

## 2011-06-27 DIAGNOSIS — Z1211 Encounter for screening for malignant neoplasm of colon: Secondary | ICD-10-CM

## 2011-06-27 MED ORDER — SODIUM CHLORIDE 0.9 % IV SOLN: 500.0000 mL | INTRAVENOUS | Status: DC

## 2011-06-27 MED ORDER — PSYLLIUM 28 % PO PACK: 1.0000 | PACK | Freq: Two times a day (BID) | ORAL | Status: AC

## 2011-06-27 NOTE — Progress Notes (Signed)
Patient did not experience any of the following events: a burn prior to discharge; a fall within the facility; wrong site/side/patient/procedure/implant event; or a hospital transfer or hospital admission upon discharge from the facility. (G8907) Patient did not have preoperative order for IV antibiotic SSI prophylaxis. (G8918)  

## 2011-06-27 NOTE — Patient Instructions (Signed)
Discharge instructions given with verbal understanding. Handouts on diverticulosis and a high fiber diet given. Resume previous medications. 

## 2011-06-27 NOTE — Op Note (Signed)
[facility] [address] [place], [place]  [address]  COLONOSCOPY PROCEDURE REPORT  PATIENT:  Nathan [name], Nathan [name]  MR#:  [id number] BIRTHDATE:  1946/05/31, 65 yrs. old  GENDER:  male ENDOSCOPIST:  Hedwig Morton. Juanda Chance, MD REF. BY:  Marga Melnick, M.D. PROCEDURE DATE:  06/27/2011 PROCEDURE:  Colonoscopy 56213 ASA CLASS:  Class I INDICATIONS:  colorectal cancer screening, average risk MEDICATIONS:   These medications were titrated to patient response per physician's verbal order, Versed 10 mg, Fentanyl 100 mcg  DESCRIPTION OF PROCEDURE:   After the risks and benefits and of the procedure were explained, informed consent was obtained. Digital rectal exam was performed and revealed no rectal masses. The [id number] endoscope was introduced through the anus and advanced to the cecum, which was identified by both the appendix and ileocecal valve.  The quality of the prep was good, using MoviPrep.  The instrument was then slowly withdrawn as the colon was fully examined. <<PROCEDUREIMAGES>>  FINDINGS:  Severe diverticulosis was found in the sigmoid colon (see image1, image2, image3, image9, image10, and image4). marked narrowing of the sigmoid colon, deep diverticuli,  This was otherwise a normal examination of the colon (see image4, image5, image6, image7, image8, and image11).   Retroflexed views in the rectum revealed no abnormalities.    The scope was then withdrawn from the patient and the procedure completed.  COMPLICATIONS:  None ENDOSCOPIC IMPRESSION: 1) Severe diverticulosis in the sigmoid colon 2) Otherwise normal examination RECOMMENDATIONS: 1) High fiber diet. metamucil 1 tsp bid  REPEAT EXAM:  In 10 year(s) for.  ______________________________ Hedwig Morton. Juanda Chance, MD  CC:  n. eSIGNED:   Hedwig Morton. Zarra Geffert at 06/27/2011 10:17 AM  Neva Seat, [phone]

## 2011-06-28 ENCOUNTER — Telehealth: Payer: Self-pay | Admitting: *Deleted

## 2011-06-28 NOTE — Telephone Encounter (Signed)

## 2011-07-02 ENCOUNTER — Other Ambulatory Visit: Payer: Self-pay | Admitting: Internal Medicine

## 2012-03-17 ENCOUNTER — Telehealth: Payer: Self-pay | Admitting: Internal Medicine

## 2012-03-17 NOTE — Telephone Encounter (Signed)
Pt called stated he would be back in states 10.9.13-10.14.13 and would like a CPE- I ADVISED pt he is medicare and it must be 1-yr+1-day for them to pay for and his last was 11.14.2012, pt refused to make an appt for after that date and stated he really just wants blood drawn when he is in  Cb# [phone]

## 2012-03-17 NOTE — Telephone Encounter (Signed)
Labs can be drawn that correspond with medications or diagnosis ONLY. I spoke with patient and informed him of this, patient stated that he will wait, patient stated he may just establish with another doctor in the state that he is living in more than 1/2 the year. I informed patient that if he decides to do so we can provide him with a copy of last labs and last OV on 04-22-12 (when he is in town). Patient can also stop by and sign a Release of records for entire medical record to be forwarded if he finds another MD. Patient verbalized understanding and stated he will call back once he is in town. Patient was in [place] at the time of call.

## 2013-02-22 ENCOUNTER — Ambulatory Visit (INDEPENDENT_AMBULATORY_CARE_PROVIDER_SITE_OTHER): Payer: Medicare Other | Admitting: Internal Medicine

## 2013-02-22 ENCOUNTER — Encounter: Payer: Self-pay | Admitting: Internal Medicine

## 2013-02-22 VITALS — BP 148/90 | HR 65 | Temp 98.1°F | Resp 12 | Ht 69.08 in | Wt 201.0 lb

## 2013-02-22 DIAGNOSIS — I10 Essential (primary) hypertension: Secondary | ICD-10-CM

## 2013-02-22 DIAGNOSIS — E785 Hyperlipidemia, unspecified: Secondary | ICD-10-CM

## 2013-02-22 DIAGNOSIS — Z1331 Encounter for screening for depression: Secondary | ICD-10-CM

## 2013-02-22 DIAGNOSIS — Z0000 Encounter for general adult medical examination without abnormal findings: Secondary | ICD-10-CM

## 2013-02-22 LAB — BASIC METABOLIC PANEL
BUN: 13 mg/dL (ref 6–23)
Chloride: 103 mEq/L (ref 96–112)
GFR: 68.06 mL/min (ref >60.00)
Glucose, Bld: 81 mg/dL (ref 70–99)
Potassium: 3.6 mEq/L (ref 3.5–5.1)
Sodium: 138 mEq/L (ref 135–145)

## 2013-02-22 LAB — LIPID PANEL
Cholesterol: 281 mg/dL — ABNORMAL HIGH (ref 0–200)
Total CHOL/HDL Ratio: 5
VLDL: 23.4 mg/dL (ref 0.0–40.0)

## 2013-02-22 LAB — HEPATIC FUNCTION PANEL
ALT: 22 U/L (ref 0–53)
AST: 27 U/L (ref 0–37)
Albumin: 4 g/dL (ref 3.5–5.2)
Alkaline Phosphatase: 53 U/L (ref 39–117)

## 2013-02-22 LAB — LDL CHOLESTEROL, DIRECT: Direct LDL: 207.3 mg/dL

## 2013-02-22 MED ORDER — ZOSTER VACCINE LIVE 19400 UNT/0.65ML ~~LOC~~ SOLR: 0.6500 mL | Freq: Once | SUBCUTANEOUS | Status: DC

## 2013-02-22 MED ORDER — BENAZEPRIL HCL 20 MG PO TABS: 20.0000 mg | ORAL_TABLET | Freq: Every day | ORAL | Status: DC

## 2013-02-22 NOTE — Progress Notes (Signed)
Subjective:    Patient ID: Nathan [name], male    DOB: 21-May-1946, 67 y.o.   MRN: [id number]  HPI Medicare Wellness Visit:  Psychosocial & medical history were reviewed as required by Medicare (abuse,antisocial behavioral risks,firearm risk).  Social history: caffeine: 1 cup / day , alcohol: 7 wines / week  ,  tobacco use:  Quit 1970  Exercise :  See below Home & personal  safety / fall risk:no Limitations of activities of daily living:no Seatbelt  and smoke alarm use:yes Power of Attorney/Living Will status : in place Ophthalmology exam status :not current Hearing evaluation status:not current Orientation :oriented X 3 Memory & recall : good Math testing:good Active depression / anxiety: Foreign travel history :  [place] until 5/14 Immunization status for Shingles /Flu/ PNA/ tetanus : Shingles needed Transfusion history:  no Preventive health surveillance status of colonoscopy as per protocol/ ION:6295 Dental care: overdue  Chart reviewed &  Updated. Active issues reviewed & addressed.      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 Specifically he denies chest pain, palpitations, dyspnea, or claudication. Family history is negative for premature coronary disease. Advanced cholesterol testing reveals his LDL goal is less than 100. No statin to date.  Significant abdominal symptoms, memory deficit, or myalgias denied.     Objective:   Physical Exam Gen.:  well-nourished in appearance. Alert, appropriate and cooperative throughout exam.  Head: Normocephalic without obvious abnormalities; pattern alopecia . Moustache Eyes: No corneal or conjunctival inflammation noted.  Extraocular motion intact. Vision grossly normal with lenses Ears: External  ear exam reveals no significant lesions or deformities. Canals clear .TMs normal. Hearing is grossly decreased bilaterally. Nose: External nasal exam reveals no  deformity or inflammation. Nasal mucosa are pink and moist. No lesions or exudates noted.   Mouth: Oral mucosa and oropharynx reveal no lesions or exudates. Teeth in good repair. Neck: No deformities, masses, or tenderness noted. Range of motion slightly decreased. Thyroid normal. Lungs: Normal respiratory effort; chest expands symmetrically. Lungs are clear to auscultation without rales, wheezes, or increased work of breathing. Heart: Normal rate and rhythm. Normal S1 and S2. No gallop, click, or rub. S4 w/o murmur. Abdomen: Bowel sounds normal; abdomen soft and nontender. No masses, organomegaly or hernias noted. Genitalia: Genitalia normal except for epididymal granuloma on R; slight L testicular atrophy & small left varices. Prostate is normal without enlargement, asymmetry, nodularity, or induration.                                  Musculoskeletal/extremities: No deformity or scoliosis noted of  the thoracic or lumbar spine.  No clubbing, cyanosis, edema, or significant extremity  deformity noted. Range of motion normal .Tone & strength  Normal. Joints normal . Nail health good. Able to lie down & sit up w/o help. Negative SLR bilaterally Vascular: Carotid, radial artery, dorsalis pedis and  posterior tibial pulses are full and equal. No bruits present. Neurologic: Alert and oriented x3. Deep tendon reflexes symmetrical and normal.          Skin: Intact without suspicious lesions or rashes. Lymph: No cervical, axillary, or inguinal lymphadenopathy present. Psych: Mood and affect are normal. Normally interactive  Assessment & Plan:  #1 Medicare Wellness Exam; criteria met ; data entered #2 Problem List/Diagnoses reviewed #3 see BP ; monitoring recommended. Rxs written for ACE-I  if > goal Plan:  Assessments made/ Orders entered

## 2013-02-22 NOTE — Patient Instructions (Addendum)
Minimal Blood Pressure Goal= AVERAGE < 140/90;  Ideal is an AVERAGE < 135/85. This AVERAGE should be calculated from @ least 5-7 BP readings taken @ different times of day on different days of week. You should not respond to isolated BP readings , but rather the AVERAGE for that week .Please bring your  blood pressure cuff to office visits to verify that it is reliable.It  can also be checked against the blood pressure device at the pharmacy. Finger or wrist cuffs are not dependable; an arm cuff is.  Fill Rx if BP not @ goal If you activate the  My Chart system; lab & Xray results will be released directly  to you as soon as I review & address these through the computer. If you choose not to sign up for My Chart within 36 hours of labs being drawn; results will be reviewed & interpretation added before being copied & mailed, causing a delay in getting the results to you.If you do not receive that report within 7-10 days ,please call. Additionally you can use this system to gain direct  access to your records  if  out of town or @ an office of a  physician who is not in  the My Chart network.  This improves continuity of care & places you in control of your medical record.

## 2013-02-23 ENCOUNTER — Other Ambulatory Visit: Payer: Self-pay | Admitting: Internal Medicine

## 2013-02-23 DIAGNOSIS — E785 Hyperlipidemia, unspecified: Secondary | ICD-10-CM

## 2013-02-23 DIAGNOSIS — N529 Male erectile dysfunction, unspecified: Secondary | ICD-10-CM

## 2013-02-23 MED ORDER — ATORVASTATIN CALCIUM 20 MG PO TABS: 20.0000 mg | ORAL_TABLET | Freq: Every day | ORAL | Status: DC

## 2013-02-23 MED ORDER — SILDENAFIL CITRATE 100 MG PO TABS: 50.0000 mg | ORAL_TABLET | Freq: Every day | ORAL | Status: DC | PRN

## 2013-03-02 ENCOUNTER — Encounter: Payer: Self-pay | Admitting: Internal Medicine

## 2013-03-04 MED ORDER — AMLODIPINE BESYLATE 5 MG PO TABS: ORAL_TABLET | ORAL | Status: DC

## 2013-03-04 NOTE — Telephone Encounter (Signed)
New rx sent to the pharmacy by e-script.//AB/CMA 

## 2013-03-17 ENCOUNTER — Ambulatory Visit (INDEPENDENT_AMBULATORY_CARE_PROVIDER_SITE_OTHER): Payer: Medicare Other | Admitting: Family Medicine

## 2013-03-17 ENCOUNTER — Encounter: Payer: Self-pay | Admitting: Family Medicine

## 2013-03-17 ENCOUNTER — Ambulatory Visit (HOSPITAL_BASED_OUTPATIENT_CLINIC_OR_DEPARTMENT_OTHER)
Admission: RE | Admit: 2013-03-17 | Discharge: 2013-03-17 | Disposition: A | Discharge status: Home / Self Care | Payer: Medicare Other | Source: Ambulatory Visit | Attending: Family Medicine | Admitting: Family Medicine

## 2013-03-17 VITALS — BP 142/82 | HR 72 | Temp 98.7°F | Wt 199.6 lb

## 2013-03-17 DIAGNOSIS — N529 Male erectile dysfunction, unspecified: Secondary | ICD-10-CM

## 2013-03-17 DIAGNOSIS — R05 Cough: Secondary | ICD-10-CM | POA: Insufficient documentation

## 2013-03-17 DIAGNOSIS — R059 Cough, unspecified: Secondary | ICD-10-CM | POA: Insufficient documentation

## 2013-03-17 DIAGNOSIS — J209 Acute bronchitis, unspecified: Secondary | ICD-10-CM

## 2013-03-17 DIAGNOSIS — Z111 Encounter for screening for respiratory tuberculosis: Secondary | ICD-10-CM

## 2013-03-17 MED ORDER — AZITHROMYCIN 250 MG PO TABS: ORAL_TABLET | ORAL | Status: DC

## 2013-03-17 MED ORDER — TADALAFIL 5 MG PO TABS: ORAL_TABLET | ORAL | Status: DC

## 2013-03-17 MED ORDER — GUAIFENESIN-CODEINE 100-10 MG/5ML PO SYRP: ORAL_SOLUTION | ORAL | Status: DC

## 2013-03-17 NOTE — Progress Notes (Signed)
  Subjective:     Nathan [name] is a 67 y.o. male here for evaluation of a cough. Onset of symptoms was 7 days ago. Symptoms have been gradually worsening since that time. The cough is productive and is aggravated by reclining position. Associated symptoms include: sputum production and wheezing. Patient does not have a history of asthma. Patient does have a history of environmental allergens. Patient has traveled recently. Patient does have a history of smoking. Patient has not had a previous chest x-ray. Patient has not had a PPD done.  Pt just moved back from [place]. He was there 1 1/2 years and was getting recurrent cases of bronchitis.  The doctors in [place] kept giving him abx. He had been fine here until recently.  He moved back in May.  The following portions of the patient's history were reviewed and updated as appropriate: allergies, current medications, past family history, past medical history, past social history, past surgical history and problem list.  Review of Systems Pertinent items are noted in HPI.    Objective:    Oxygen saturation 97% on room air BP 142/82  Pulse 72  Temp(Src) 98.7 F (37.1 C) (Oral)  Wt 199 lb 9.6 oz (90.538 kg)  BMI 29.4 kg/m2  SpO2 97% General appearance: alert, cooperative, appears stated age and no distress Ears: normal TM's and external ear canals both ears Nose: Nares normal. Septum midline. Mucosa normal. No drainage or sinus tenderness. Throat: lips, mucosa, and tongue normal; teeth and gums normal Neck: no adenopathy, no carotid bruit, no JVD, supple, symmetrical, trachea midline and thyroid not enlarged, symmetric, no tenderness/mass/nodules Lungs: rhonchi posterior - bilateral and wheezes posterior - bilateral Heart: S1, S2 normal    Assessment:    Acute Bronchitis ------ recurrent    Plan:  PPD placed  Antibiotics per medication orders. Antitussives per medication orders. Avoid exposure to tobacco smoke and fumes. Call if  shortness of breath worsens, blood in sputum, change in character of cough, development of fever or chills, inability to maintain nutrition and hydration. Avoid exposure to tobacco smoke and fumes. Chest x-ray.

## 2013-03-17 NOTE — Patient Instructions (Addendum)

## 2013-03-18 ENCOUNTER — Telehealth: Payer: Self-pay | Admitting: *Deleted

## 2013-03-18 NOTE — Telephone Encounter (Signed)
Prior was requested 03/17/2013 for cialis.  A prior Berkley Harvey was started and now we are waiting on reply.  Ag cma

## 2013-03-18 NOTE — Telephone Encounter (Signed)
A prior auth was started for Nathan [name] 03/18/13 for cialis 5mg .  And it has been denied because this medication is excluded from patient insurance.  Ag cma

## 2013-03-19 ENCOUNTER — Telehealth: Payer: Self-pay

## 2013-03-19 LAB — TB SKIN TEST: TB Skin Test: NEGATIVE

## 2013-03-19 NOTE — Telephone Encounter (Signed)
Patient arrived for TB read. States that he is not feeling a lot better and only has one dose of the medication left. Advised patient that Z-pak can stay in the system post completion for 7-10 days. Advised to get plenty of rest and fluids. If not feeling improvement by the beginning of the week to let us know. Patient agrees with plan.

## 2013-03-22 ENCOUNTER — Telehealth: Payer: Self-pay | Admitting: Internal Medicine

## 2013-03-22 NOTE — Telephone Encounter (Signed)
Pt scheduled for 9/10. Advised he could call tomorrow morning and be scheduled on doc of the day appt.

## 2013-03-22 NOTE — Telephone Encounter (Signed)
Patient Information:  Caller Name: [name]  Phone: [phone]  Patient: Nathan [name], Nathan [name]  Gender: Male  DOB: Mar 28, 1946  Age: 67 Years  PCP: Marga Melnick  Office Follow Up:  Does the office need to follow up with this patient?: Yes  Instructions For The Office: Pt needs a work in appt for Bronchitis cough.  He is leaving on a trip 03/25/13.   Please call him to advise.   Symptoms  Reason For Call & Symptoms: PT calling that he was seen last week by Dr. Laury Axon 03/17/13 and DX with  recurrent acute Bronchitis and he was on an antibiotic, Zithromax,  but does not feel he is much better.  He is still coughing and very tired and he is coughing up phlegm with a green tint.  Afebrile/subjective  Reviewed Health History In EMR: Yes  Reviewed Medications In EMR: Yes  Reviewed Allergies In EMR: Yes  Reviewed Surgeries / Procedures: Yes  Date of Onset of Symptoms: 03/11/2013  Treatments Tried: Zithromax and Guiefessin  Treatments Tried Worked: No  Guideline(s) Used:  Cough  Disposition Per Guideline:   See Today or Tomorrow in Office  Reason For Disposition Reached:   Patient wants to be seen  Advice Given:  N/A  Patient Will Follow Care Advice:  YES

## 2013-03-23 ENCOUNTER — Ambulatory Visit (INDEPENDENT_AMBULATORY_CARE_PROVIDER_SITE_OTHER): Payer: Medicare Other | Admitting: Internal Medicine

## 2013-03-23 ENCOUNTER — Encounter: Payer: Self-pay | Admitting: Internal Medicine

## 2013-03-23 VITALS — BP 145/79 | HR 79 | Temp 98.6°F | Wt 199.0 lb

## 2013-03-23 DIAGNOSIS — J209 Acute bronchitis, unspecified: Secondary | ICD-10-CM

## 2013-03-23 MED ORDER — CEFUROXIME AXETIL 500 MG PO TABS: 500.0000 mg | ORAL_TABLET | Freq: Two times a day (BID) | ORAL | Status: DC

## 2013-03-23 NOTE — Progress Notes (Signed)
  Subjective:    Patient ID: Nathan [name], male    DOB: Jul 25, 1945, 67 y.o.   MRN: [id number]  HPI    Symptoms began 03/11/13 as a nonproductive cough. The cough has progressed and was subsequently productive of green mucus  as of 9 days ago. He's had some nocturnal wheezing.  Wheezes had associated significant malaise and fatigue  He's had some partial response ( 40-50 % improvement) to azithromycin.  He quit smoking in 1970. He has no history of asthma. He is on ACE inhibitor    Review of Systems  He denies extrinsic symptoms of itchy, watery eyes, or sneezing.  He also denies frontal headache, facial pain, nasal purulence, dental pain, sore throat, earache, otic discharge.  He has not had shortness of breath. He also denies fever chills or sweats     Objective:   Physical Exam  General appearance:good health ;well nourished; no acute distress or increased work of breathing is present.  No  lymphadenopathy about the head, neck, or axilla noted.   Eyes: No conjunctival inflammation or lid edema is present.   Ears:  External ear exam shows no significant lesions or deformities.  Otoscopic examination reveals minimal wax.Tympanic membranes are intact bilaterally without bulging, retraction, inflammation or discharge.  Nose:  External nasal examination shows no deformity or inflammation. Nasal mucosa are pink and moist without lesions or exudates. No septal dislocation or deviation.No obstruction to airflow.   Oral exam: Dental hygiene is good; lips and gums are healthy appearing.There is mild  oropharyngeal erythema .No exudate noted.   Neck:  No deformities,  masses, or tenderness noted.     Heart:  Normal rate and regular rhythm. S1 and S2 normal without gallop, murmur, click, rub or other extra sounds.   Lungs:Chest clear to auscultation; no wheezes, rhonchi,rales ,or rubs present.No increased work of breathing.    Extremities:  No cyanosis, edema, or clubbing  noted     Skin: Warm & dry         Assessment & Plan:  #1 acute bronchitis w/o bronchospasm  Plan: See orders and recommendations

## 2013-03-23 NOTE — Patient Instructions (Addendum)
Plain Mucinex for thick secretions ;force NON dairy fluids for next 48 hrs.

## 2013-03-24 ENCOUNTER — Ambulatory Visit: Payer: Medicare Other | Admitting: Internal Medicine

## 2013-03-31 ENCOUNTER — Encounter: Payer: Medicare Other | Admitting: Internal Medicine

## 2013-04-29 ENCOUNTER — Other Ambulatory Visit: Payer: Self-pay | Admitting: Internal Medicine

## 2013-04-29 DIAGNOSIS — I10 Essential (primary) hypertension: Secondary | ICD-10-CM

## 2013-04-29 NOTE — Telephone Encounter (Signed)
Spoke with the pt and informed him that he was given a rx for the Zoster vaccine and Benazepril 20mg  at his OV with Dr. Alwyn Ren on 02-22-13.  And his Amlodipine 5mg  has 2 refills on it.  Pt stated that he was out of town but when he gets home he will look for the rx.//AB/CMA

## 2013-05-17 ENCOUNTER — Ambulatory Visit (INDEPENDENT_AMBULATORY_CARE_PROVIDER_SITE_OTHER): Payer: Medicare Other | Admitting: Internal Medicine

## 2013-05-17 ENCOUNTER — Encounter: Payer: Self-pay | Admitting: Internal Medicine

## 2013-05-17 VITALS — BP 127/68 | HR 66 | Temp 98.4°F

## 2013-05-17 DIAGNOSIS — R05 Cough: Secondary | ICD-10-CM

## 2013-05-17 DIAGNOSIS — I10 Essential (primary) hypertension: Secondary | ICD-10-CM

## 2013-05-17 LAB — CBC WITH DIFFERENTIAL/PLATELET
Basophils Absolute: 0 10*3/uL (ref 0.0–0.1)
Basophils Relative: 0.3 % (ref 0.0–3.0)
Eosinophils Absolute: 0.1 10*3/uL (ref 0.0–0.7)
HCT: 42.1 % (ref 39.0–52.0)
Hemoglobin: 14.1 g/dL (ref 13.0–17.0)
Lymphs Abs: 2.1 10*3/uL (ref 0.7–4.0)
MCHC: 33.6 g/dL (ref 30.0–36.0)
MCV: 91.6 fl (ref 78.0–100.0)
Monocytes Absolute: 0.6 10*3/uL (ref 0.1–1.0)
Neutro Abs: 6.9 10*3/uL (ref 1.4–7.7)
RBC: 4.59 Mil/uL (ref 4.22–5.81)
RDW: 15.3 % — ABNORMAL HIGH (ref 11.5–14.6)

## 2013-05-17 MED ORDER — PREDNISONE 20 MG PO TABS: 20.0000 mg | ORAL_TABLET | Freq: Two times a day (BID) | ORAL | Status: DC

## 2013-05-17 MED ORDER — LOSARTAN POTASSIUM 100 MG PO TABS: 100.0000 mg | ORAL_TABLET | Freq: Every day | ORAL | Status: DC

## 2013-05-17 MED ORDER — AZITHROMYCIN 250 MG PO TABS: ORAL_TABLET | ORAL | Status: DC

## 2013-05-17 NOTE — Patient Instructions (Signed)
Your next office appointment will be determined based upon review of your pending labs &/ or x-rays & response to meds. Those instructions will be transmitted to you through My Chart  . Followup as needed for your this acute issue. Please report any significant change in your symptoms. Additional studies are necessary  to optimally evaluate your symptoms ; please complete these @  [facility] , [address]

## 2013-05-17 NOTE — Progress Notes (Signed)
  Subjective:    Patient ID: Nathan [name], male    DOB: 21-Dec-1945, 67 y.o.   MRN: [id number]  HPI    He continues to have intermittently productive cough since he was treated for bronchitis approximately 2 months ago. The sputum is described as clear. There is no associated hemoptysis. He also has had intermittent wheezing.  The cough is worse supine. Robitussin-DM has been of partial benefit.  Occupational/environmental exposures:no , retired Smoking:quit 1970 ACE inhibitor:yes Treatment/efficacy:Robitussin DM partially beneficial Past medical history/family history pulmonary disease:father had RADS & TB   Review of Systems  He denies fever, chills, or sweats. He has no frontal headache, facial pain, nasal purulence, or postnasal drainage. No GERD symptoms     Objective:   Physical Exam  General appearance:good health ;well nourished; no acute distress or increased work of breathing is present.  No  lymphadenopathy about the head, neck, or axilla noted.   Eyes: No conjunctival inflammation or lid edema is present. There is no scleral icterus.  Ears:  External ear exam shows no significant lesions or deformities.  Otoscopic examination reveals clear canals, tympanic membranes are intact bilaterally without bulging, retraction, inflammation or discharge.  Nose:  External nasal examination shows no deformity or inflammation. Nasal mucosa are pink and moist without lesions or exudates. No septal dislocation or deviation.No obstruction to airflow.   Oral exam: Dental hygiene is good; lips and gums are healthy appearing.There is no oropharyngeal erythema or exudate noted.   Neck:  No deformities,  masses, or tenderness noted.   Supple with full range of motion without pain.   Heart:  Normal rate and regular rhythm. S1 and S2 normal without gallop, murmur, click, rub or other extra sounds.   Lungs:Chest clear to auscultation; no wheezes, rhonchi,rales ,or rubs present.No increased  work of breathing.    Extremities:  No cyanosis, edema, or clubbing  noted    Skin: Warm & dry w/o jaundice or tenting.         Assessment & Plan:  #1 cough in context of ACE-I Rx  See Orders

## 2013-05-18 ENCOUNTER — Ambulatory Visit (HOSPITAL_BASED_OUTPATIENT_CLINIC_OR_DEPARTMENT_OTHER)
Admission: RE | Admit: 2013-05-18 | Discharge: 2013-05-18 | Disposition: A | Discharge status: Home / Self Care | Payer: Medicare Other | Source: Ambulatory Visit | Attending: Internal Medicine | Admitting: Internal Medicine

## 2013-05-18 DIAGNOSIS — R05 Cough: Secondary | ICD-10-CM | POA: Insufficient documentation

## 2013-05-18 DIAGNOSIS — R059 Cough, unspecified: Secondary | ICD-10-CM | POA: Insufficient documentation

## 2013-05-20 ENCOUNTER — Other Ambulatory Visit: Payer: Self-pay

## 2013-07-24 ENCOUNTER — Other Ambulatory Visit: Payer: Self-pay | Admitting: Internal Medicine

## 2013-07-26 NOTE — Telephone Encounter (Signed)
Amlodipine and Losartan refilled per protocol. JG//CMA

## 2014-03-03 ENCOUNTER — Ambulatory Visit (INDEPENDENT_AMBULATORY_CARE_PROVIDER_SITE_OTHER): Payer: Medicare Other | Admitting: Internal Medicine

## 2014-03-03 ENCOUNTER — Other Ambulatory Visit (INDEPENDENT_AMBULATORY_CARE_PROVIDER_SITE_OTHER): Payer: Medicare Other

## 2014-03-03 ENCOUNTER — Encounter: Payer: Self-pay | Admitting: Internal Medicine

## 2014-03-03 VITALS — BP 160/100 | HR 61 | Temp 98.2°F | Ht 70.5 in | Wt 191.0 lb

## 2014-03-03 DIAGNOSIS — R9389 Abnormal findings on diagnostic imaging of other specified body structures: Secondary | ICD-10-CM

## 2014-03-03 DIAGNOSIS — I10 Essential (primary) hypertension: Secondary | ICD-10-CM

## 2014-03-03 DIAGNOSIS — Z0000 Encounter for general adult medical examination without abnormal findings: Secondary | ICD-10-CM

## 2014-03-03 DIAGNOSIS — R3989 Other symptoms and signs involving the genitourinary system: Secondary | ICD-10-CM

## 2014-03-03 DIAGNOSIS — E785 Hyperlipidemia, unspecified: Secondary | ICD-10-CM

## 2014-03-03 DIAGNOSIS — F3289 Other specified depressive episodes: Secondary | ICD-10-CM

## 2014-03-03 DIAGNOSIS — F329 Major depressive disorder, single episode, unspecified: Secondary | ICD-10-CM

## 2014-03-03 LAB — BASIC METABOLIC PANEL
BUN: 12 mg/dL (ref 6–23)
CO2: 27 mEq/L (ref 19–32)
CREATININE: 1.1 mg/dL (ref 0.4–1.5)
Calcium: 9.2 mg/dL (ref 8.4–10.5)
Chloride: 105 mEq/L (ref 96–112)
GFR: 70.71 mL/min (ref >60.00)
Glucose, Bld: 85 mg/dL (ref 70–99)
Potassium: 3.5 mEq/L (ref 3.5–5.1)
Sodium: 140 mEq/L (ref 135–145)

## 2014-03-03 LAB — PSA: PSA: 5.77 ng/mL — ABNORMAL HIGH (ref 0.10–4.00)

## 2014-03-03 LAB — LIPID PANEL
CHOLESTEROL: 311 mg/dL — AB (ref 0–200)
HDL: 82.7 mg/dL (ref >39.00)
LDL CALC: 207 mg/dL — AB (ref 0–99)
NonHDL: 228.3
Total CHOL/HDL Ratio: 4
Triglycerides: 105 mg/dL (ref 0.0–149.0)
VLDL: 21 mg/dL (ref 0.0–40.0)

## 2014-03-03 LAB — HEPATIC FUNCTION PANEL
ALT: 19 U/L (ref 0–53)
AST: 21 U/L (ref 0–37)
Albumin: 4 g/dL (ref 3.5–5.2)
Alkaline Phosphatase: 59 U/L (ref 39–117)
BILIRUBIN TOTAL: 1.3 mg/dL — AB (ref 0.2–1.2)
Bilirubin, Direct: 0.2 mg/dL (ref 0.0–0.3)
Total Protein: 6.5 g/dL (ref 6.0–8.3)

## 2014-03-03 LAB — TSH: TSH: 1.14 u[IU]/mL (ref 0.35–4.50)

## 2014-03-03 NOTE — Progress Notes (Signed)
Pre visit review using our clinic review tool, if applicable. No additional management support is needed unless otherwise documented below in the visit note. 

## 2014-03-03 NOTE — Assessment & Plan Note (Signed)
Blood pressure goals reviewed. BMET 

## 2014-03-03 NOTE — Progress Notes (Signed)
Subjective:    Patient ID: Nathan [name], male    DOB: 05-10-1946, 68 y.o.   MRN: [id number]  HPI UHC/Medicare Wellness Visit: Psychosocial and medical history were reviewed as required by Medicare (history related to abuse, antisocial behavior , firearm risk). He has 2 cups of coffee a day; 1-2 glasses of wine per day; he quit smoking at age 86. Ophthalmologic exam is not up to date nor is hearing evaluation. Math testing was good as was recall. Colonoscopy is up-to-date. He sees his dentist annually. Last foreign travel was [place] in May of 2014. He's never had a blood transfusion. He denies any safety or fall risk. He has no limitations of activities daily living. He does use a seatbelt & smoke alarms. Health care power of attorney and living will in place. He admits to depression. He has tried multiple agents in the past without benefit. He has started a supplement, possibly tryptophan as of last night. He will verify his immunization status and schedule any needed shots.  Chart reviewed and updated. Active issues reviewed and addressed as documented below.  He stopped his blood pressure medicine 6 months ago and has not monitored his blood pressure despite having what he calls a state-of-the-art digital cuff.  He is on a heart healthy diet but does eat salt.  He's been biking 10 miles every other day and using machines at the gym 2 times per week without associated symptoms.  He stated he hoped  exercise and diet would control his blood pressure as well as cholesterol. LDL has been as high as over 200. To date he has declined a statin.  Review of Systems   Significant headaches, epistaxis, chest pain, palpitations, exertional dyspnea, claudication, paroxysmal nocturnal dyspnea, or edema absent.           Objective:   Physical Exam  Pertinent or positive findings include: Pattern alopecia is present He has decreased hearing bilaterally. There is wax in the canals but w/o  impactions. He has crepitus of the knees. The prostate is asymmetrically mildly enlarged with the left lobe greater than the right. The left lobe is firm Although he is oriented x3; his judgment is questionable based on his failure to monitor blood pressure after stopping his blood pressure medicines. Additionally he's had profoundly elevated LDLs and has declined to treat this.   Gen.: Healthy and well-nourished in appearance. Alert, appropriate and cooperative throughout exam. Appears younger than stated age  Head: Normocephalic without obvious abnormalities  Eyes: No corneal or conjunctival inflammation noted. Pupils equal round reactive to light and accommodation. Extraocular motion intact.  Ears: External  ear exam reveals no significant lesions or deformities. Nose: External nasal exam reveals no deformity or inflammation. Nasal mucosa are pink and moist. No lesions or exudates noted.   Mouth: Oral mucosa and oropharynx reveal no lesions or exudates. Teeth in good repair. Neck: No deformities, masses, or tenderness noted. Range of motion & Thyroid normal Lungs: Normal respiratory effort; chest expands symmetrically. Lungs are clear to auscultation without rales, wheezes, or increased work of breathing. Heart: Normal rate and rhythm. Normal S1 and S2. No gallop, click, or rub. No murmur. Abdomen: Bowel sounds normal; abdomen soft and nontender. No masses, organomegaly or hernias noted. Genitalia: Genitalia normal except for left varices. Musculoskeletal/extremities: No deformity or scoliosis noted of  the thoracic or lumbar spine. No clubbing, cyanosis, edema, or significant extremity  deformity noted. Range of motion normal .Tone & strength normal. Hand joints normal.  Fingernail health good. Able to lie down & sit up w/o help. Negative SLR bilaterally Vascular: Carotid, radial artery, dorsalis pedis and  posterior tibial pulses are full and equal. No bruits present. Neurologic: Alert and  oriented x3. Deep tendon reflexes symmetrical and normal.  Gait normal .     Skin: Intact without suspicious lesions or rashes. Lymph: No cervical, axillary, or inguinal lymphadenopathy present. Psych: Mood and affect are normal. Normally interactive                                                                                       Assessment & Plan:  See Current Assessment & Plan in Problem List under specific DiagnosisThe labs will be reviewed and risks and options assessed. Written recommendations will be provided by mail or directly through My Chart.Further evaluation or change in medical therapy will be directed by those results.

## 2014-03-03 NOTE — Patient Instructions (Signed)

## 2014-03-03 NOTE — Assessment & Plan Note (Signed)
Reassess after 6 weeks of supplement

## 2014-03-03 NOTE — Assessment & Plan Note (Signed)
Lipids, LFTs, TSH  

## 2014-03-07 ENCOUNTER — Other Ambulatory Visit: Payer: Self-pay | Admitting: Internal Medicine

## 2014-03-07 ENCOUNTER — Encounter: Payer: Self-pay | Admitting: Internal Medicine

## 2014-03-07 DIAGNOSIS — E785 Hyperlipidemia, unspecified: Secondary | ICD-10-CM

## 2014-03-07 MED ORDER — ATORVASTATIN CALCIUM 10 MG PO TABS: 10.0000 mg | ORAL_TABLET | Freq: Every day | ORAL | Status: DC

## 2014-04-25 ENCOUNTER — Telehealth: Payer: Self-pay

## 2014-04-25 NOTE — Telephone Encounter (Signed)
Attempted to notify patient. No answer. Voicemail not set up.

## 2014-04-25 NOTE — Telephone Encounter (Signed)
Message copied by Shelly Coss on Mon Apr 25, 2014  8:04 AM ------      Message from: Hendricks Limes      Created: Sun Apr 24, 2014  2:43 PM       He needs fasting labs before I can reorder Atorvastatin for 90 days with refills.Orders in ; labs need to be done while he is still on med. Hopp ------

## 2014-07-05 ENCOUNTER — Encounter (HOSPITAL_COMMUNITY): Payer: Self-pay | Admitting: Emergency Medicine

## 2014-07-05 ENCOUNTER — Emergency Department (HOSPITAL_COMMUNITY)
Admission: EM | Admit: 2014-07-05 | Discharge: 2014-07-05 | Disposition: A | Discharge status: Home / Self Care | Payer: Medicare Other | Attending: Emergency Medicine | Admitting: Emergency Medicine

## 2014-07-05 ENCOUNTER — Telehealth: Payer: Self-pay | Admitting: Internal Medicine

## 2014-07-05 DIAGNOSIS — Z79899 Other long term (current) drug therapy: Secondary | ICD-10-CM | POA: Diagnosis not present

## 2014-07-05 DIAGNOSIS — I10 Essential (primary) hypertension: Secondary | ICD-10-CM | POA: Diagnosis not present

## 2014-07-05 DIAGNOSIS — Z8669 Personal history of other diseases of the nervous system and sense organs: Secondary | ICD-10-CM | POA: Diagnosis not present

## 2014-07-05 DIAGNOSIS — Z87891 Personal history of nicotine dependence: Secondary | ICD-10-CM | POA: Diagnosis not present

## 2014-07-05 DIAGNOSIS — Z87448 Personal history of other diseases of urinary system: Secondary | ICD-10-CM | POA: Insufficient documentation

## 2014-07-05 DIAGNOSIS — F329 Major depressive disorder, single episode, unspecified: Secondary | ICD-10-CM | POA: Insufficient documentation

## 2014-07-05 DIAGNOSIS — E785 Hyperlipidemia, unspecified: Secondary | ICD-10-CM | POA: Diagnosis not present

## 2014-07-05 DIAGNOSIS — Z8739 Personal history of other diseases of the musculoskeletal system and connective tissue: Secondary | ICD-10-CM | POA: Insufficient documentation

## 2014-07-05 DIAGNOSIS — F32A Depression, unspecified: Secondary | ICD-10-CM

## 2014-07-05 NOTE — ED Provider Notes (Signed)
CSN: [id number]     Arrival date & time 07/05/14  1212 History  This chart was scribed for Glendell Docker, FNP, working with Debby Freiberg, MD by Marti Sleigh, ED Scribe. This patient was seen in room [facility]3/[facility]3 and the patient's care was started at 12:45 PM.     Chief Complaint  Patient presents with  . Depression    HPI  HPI Comments: Nathan [name] is a 68 y.o. male with a hx of HTN, HLD who presents to the Emergency Department complaining of depression for the last two years. Pt states he has been having family problems for the last two years. Pt states he believes this may be why he is feeling depressed, but states his family problems are unlikely to resolve. Pt states he is having trouble functioning, and does the bare minimum in all activities. Pt states he was on antidepressants (Wellbutrin, Effexor, and Zoloft) in 2003 without relief. Pt saw Dr. Linna Darner at that time. Dr hopper is the Pt's PCP. Pt states that his symptoms spontaneously resolved for four years, but have returned in the last two years. Pt denies sleep disturbance, recent medications changes, SI, HI, blurred vision, CP, or SOB. Pt states he takes melatonin for sleep. Pt exercises regularly.    Past Medical History  Diagnosis Date  . Osteoarthrosis, unspecified whether generalized or localized, unspecified site   . Macular degeneration (senile) of retina, unspecified     2 different opinions from 2 different Ophth  . Prostatitis 2008  . Elevated prostate specific antigen (PSA) 2008    Dr Jeffie Pollock  . Other and unspecified hyperlipidemia   . Depressive disorder, not elsewhere classified   . Allergy     hay fever  . Hypertension    Past Surgical History  Procedure Laterality Date  . Tonsillectomy    . Hydrocoelectomy  2003    Dr Jeffie Pollock  . Prostate nodule resection  2003  . Cystoscopy      for hematuria  . Total knee arthroplasty  2009  . Knee arthroscopy  1994  . Colonoscopy  2012    Dr Olevia Perches   Family History   Problem Relation Age of Onset  . Lung cancer Father     smoker  . Alcohol abuse Father   . Diabetes Other   . Heart disease Neg Hx   . Stroke Neg Hx    History  Substance Use Topics  . Smoking status: Former Smoker    Quit date: 07/15/1968  . Smokeless tobacco: Never Used     Comment: smoked [place], up to 1ppd  . Alcohol Use: 4.2 oz/week    7 Glasses of wine per week     Comment: wine with dinner     Review of Systems  Eyes: Negative for visual disturbance.  Respiratory: Negative for shortness of breath.   Cardiovascular: Negative for chest pain.  Psychiatric/Behavioral: Positive for dysphoric mood. Negative for suicidal ideas.    Allergies  Citalopram hydrobromide; Morphine and related; and Sertraline hcl  Home Medications   Prior to Admission medications   Medication Sig Start Date End Date Taking? Authorizing Provider  amLODipine (NORVASC) 5 MG tablet TAKE ONE TABLET BY MOUTH ONCE DAILY 07/24/13   Hendricks Limes, MD  atorvastatin (LIPITOR) 10 MG tablet Take 1 tablet (10 mg total) by mouth daily. 03/07/14   Hendricks Limes, MD  b complex vitamins tablet Take 1 tablet by mouth daily.      Historical Provider, MD  Bioflavonoid Products (VITAMIN C PLUS) 500 MG TABS Take 1 tablet by mouth daily.      Historical Provider, MD  fish oil-omega-3 fatty acids 1000 MG capsule Take 1 g by mouth daily.      Historical Provider, MD  GARLIC PO Take by mouth.      Historical Provider, MD  losartan (COZAAR) 100 MG tablet TAKE ONE TABLET BY MOUTH ONCE DAILY IN  PLACE  OF  BENAZEPRIL 07/24/13   Hendricks Limes, MD  Multiple Vitamins-Minerals (MULTIVITAMIN WITH MINERALS) tablet Take 1 tablet by mouth daily.      Historical Provider, MD  Probiotic Product (PROBIOTIC DAILY PO) Take by mouth daily.    Historical Provider, MD  Saw Palmetto 450 MG CAPS Take 1 capsule by mouth daily.      Historical Provider, MD  Vitamin Mixture (VITAMIN E COMPLETE PO) Take by mouth daily.    Historical  Provider, MD  zoster vaccine live, PF, (ZOSTAVAX) 63016 UNT/0.65ML injection Inject 19,400 Units into the skin once. 02/22/13   Hendricks Limes, MD   BP 169/77 mmHg  Pulse 79  Temp(Src) 98.2 F (36.8 C) (Oral)  Resp 16  SpO2 100% Physical Exam  Constitutional: He is oriented to person, place, and time. He appears well-developed and well-nourished.  HENT:  Head: Normocephalic and atraumatic.  Eyes: Pupils are equal, round, and reactive to light.  Neck: Neck supple.  Cardiovascular: Normal rate and regular rhythm.   Pulmonary/Chest: Effort normal and breath sounds normal. No respiratory distress.  Neurological: He is alert and oriented to person, place, and time.  Skin: Skin is warm and dry.  Psychiatric: He has a normal mood and affect. His behavior is normal. He expresses no homicidal and no suicidal ideation.  Nursing note and vitals reviewed.   ED Course  Procedures  DIAGNOSTIC STUDIES: Oxygen Saturation is 100% on RA, normal by my interpretation.    COORDINATION OF CARE: 12:51 PM Discussed treatment plan with pt at bedside and pt agreed to plan.  Labs Review Labs Reviewed - No data to display  Imaging Review No results found.   EKG Interpretation None      MDM   Final diagnoses:  Depression    1:05 PM Will discuss possible medications with tts as pt has tried effexor,zoloft and wellbutrin without relief I personally performed the services described in this documentation, which was scribed in my presence. The recorded information has been reviewed and is accurate. 3:01 PM Discussed with the pysch nurse practitioner recommendations and said they don't give antidepressants unless they are going to follow them because or risk of suicidal ideation after starting new medication. They discussed using vistaril short term but pt states that he is going to see Dr. Linna Darner tomorrow and he will just have him start him on something. Pt is very frustrated that we didn't start  him on any medications today   Glendell Docker, NP 07/05/14 1504  Debby Freiberg, MD 07/10/14 2352

## 2014-07-05 NOTE — Telephone Encounter (Signed)
Pt is requesting work in appt today or advice of what to do, he is very depressed. Pls advise [phone] cell#

## 2014-07-05 NOTE — BH Assessment (Signed)
Writer informed Kristein C. of the assessment.

## 2014-07-05 NOTE — BH Assessment (Addendum)
Tele Assessment Note   Nathan [name] is an 68 y.o. male who came into the ER which was suggested by his Primary Care Physician- Dr. Linna Darner due to his increasing depression. Pt was flat and depressed upon assessment and stated that he "does not have motivation or drive anymore" and states that he "is lethargic and never has any energy". He reports that this has been worsening but started around a year ago. He states that he just remarried his wife a year ago and that has been a big stressor because "things have not been going well". He denies SI, HI and A/V hallucinations currently and states that he has never had thoughts to hurt himself or anyone else in the past either. He states that he has had a depressive episode in the past in around 2003 and was treated with medications. He states that the medications were not really working but he was able to come out of the depression on his own after discontinuing medications. Denies history of abuse. He states that he just wants something to help him get "balanced". He states that his symptoms of depression are getting worse and he is not able to handle the stress of daily life the way he used to. 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 Pt does not endorse any indication of SI in the present or past. Recommendations are for pt to go to outpatient therapy and a psychiatrist for medication management- per Catalina Pizza NP.   Axis I: 296.32 Major Depressive Disorder, Recurrent, Moderate Axis II: Deferred Axis III:  Past Medical History  Diagnosis Date  . Osteoarthrosis, unspecified whether generalized or localized, unspecified site   . Macular degeneration (senile) of retina, unspecified     2 different opinions from 2 different Ophth  . Prostatitis 2008  . Elevated prostate specific antigen (PSA) 2008    Dr Jeffie Pollock  . Other and unspecified hyperlipidemia   .  Depressive disorder, not elsewhere classified   . Allergy     hay fever  . Hypertension    Axis IV: problems related to social environment and problems with primary support group Axis V: 51-60 moderate symptoms  Past Medical History:  Past Medical History  Diagnosis Date  . Osteoarthrosis, unspecified whether generalized or localized, unspecified site   . Macular degeneration (senile) of retina, unspecified     2 different opinions from 2 different Ophth  . Prostatitis 2008  . Elevated prostate specific antigen (PSA) 2008    Dr Jeffie Pollock  . Other and unspecified hyperlipidemia   . Depressive disorder, not elsewhere classified   . Allergy     hay fever  . Hypertension     Past Surgical History  Procedure Laterality Date  . Tonsillectomy    . Hydrocoelectomy  2003    Dr Jeffie Pollock  . Prostate nodule resection  2003  . Cystoscopy      for hematuria  . Total knee arthroplasty  2009  . Knee arthroscopy  1994  . Colonoscopy  2012    Dr Olevia Perches    Family History:  Family History  Problem Relation Age of Onset  . Lung cancer Father     smoker  . Alcohol abuse Father   . Diabetes Other   . Heart disease Neg Hx   . Stroke Neg Hx     Social History:  reports that he quit smoking about  46 years ago. He has never used smokeless tobacco. He reports that he drinks about 4.2 oz of alcohol per week. He reports that he does not use illicit drugs.  Additional Social History:  Alcohol / Drug Use History of alcohol / drug use?: No history of alcohol / drug abuse Longest period of sobriety (when/how long): Denies substance abuse history   CIWA: CIWA-Ar BP: 169/77 mmHg Pulse Rate: 79 COWS:    PATIENT STRENGTHS: (choose at least two) Average or above average intelligence Capable of independent living Communication skills Financial means General fund of knowledge  Allergies:  Allergies  Allergen Reactions  . Citalopram Hydrobromide     Just ineffective  . Morphine And Related  Other (See Comments)    Severe drop in blood pressure  . Sertraline Hcl     REACTION: headache    Home Medications:  (Not in a hospital admission)  OB/GYN Status:  No LMP for male patient.  General Assessment Data Location of Assessment: [facility] ACT Assessment: Yes Is this a Tele or Face-to-Face Assessment?: Tele Assessment Is this an Initial Assessment or a Re-assessment for this encounter?: Initial Assessment Living Arrangements: Spouse/significant other Can pt return to current living arrangement?: Yes Admission Status: Voluntary Is patient capable of signing voluntary admission?: Yes Transfer from: Home Referral Source: Self/Family/Friend     [place] Living Arrangements: Spouse/significant other Name of Psychiatrist:  (None PCP- Dr. Linna Darner) Name of Therapist:  (None)     Risk to self with the past 6 months Suicidal Ideation: No Suicidal Intent: No Is patient at risk for suicide?: No Suicidal Plan?: No Access to Means:  (unknown) What has been your use of drugs/alcohol within the last 12 months?:  (None) Previous Attempts/Gestures: No How many times?:  (0) Other Self Harm Risks:  (None) Triggers for Past Attempts: None known Intentional Self Injurious Behavior: None Family Suicide History: No Recent stressful life event(s): Conflict (Comment) (marital issues) Persecutory voices/beliefs?: No Depression: Yes Depression Symptoms: Despondent, Fatigue, Loss of interest in usual pleasures, Feeling worthless/self pity Substance abuse history and/or treatment for substance abuse?: No Suicide prevention information given to non-admitted patients: Not applicable  Risk to Others within the past 6 months Homicidal Ideation: No Thoughts of Harm to Others: No Current Homicidal Intent: No Current Homicidal Plan: No Access to Homicidal Means: No Identified Victim:  (No) History of harm to others?: No Assessment of Violence: None Noted Violent Behavior  Description:  (None) Does patient have access to weapons?:  (unknown) Criminal Charges Pending?: No Does patient have a court date: No  Psychosis Hallucinations: None noted Delusions: None noted  Mental Status Report Appear/Hygiene: Unremarkable Eye Contact: Good Motor Activity: Unremarkable Speech: Unremarkable Level of Consciousness: Alert Mood: Depressed Affect: Blunted, Depressed Anxiety Level: Minimal Thought Processes: Coherent Judgement: Unimpaired Orientation: Person, Place, Time Obsessive Compulsive Thoughts/Behaviors: Minimal  Cognitive Functioning Concentration: Normal Memory: Recent Intact, Remote Intact IQ: Above Average Insight: Fair Impulse Control: Fair Appetite: Good Weight Loss:  (0) Weight Gain:  (0) Sleep: Increased Total Hours of Sleep:  (6-7 with naps) Vegetative Symptoms: Staying in bed  ADLScreening [facility] Assessment Services) Patient's cognitive ability adequate to safely complete daily activities?: Yes Patient able to express need for assistance with ADLs?: Yes Independently performs ADLs?: Yes (appropriate for developmental age)  Prior Inpatient Therapy Prior Inpatient Therapy: No Prior Therapy Dates:  (None) Prior Therapy Facilty/Provider(s):  (None) Reason for Treatment:  (N/A)  Prior Outpatient Therapy Prior Outpatient Therapy: No Prior Therapy Dates:  (  N/A) Prior Therapy Facilty/Provider(s):  (N/A) Reason for Treatment:  (N/A)  ADL Screening (condition at time of admission) Patient's cognitive ability adequate to safely complete daily activities?: Yes Is the patient deaf or have difficulty hearing?: No Does the patient have difficulty seeing, even when wearing glasses/contacts?: No Does the patient have difficulty concentrating, remembering, or making decisions?: No Patient able to express need for assistance with ADLs?: Yes Does the patient have difficulty dressing or bathing?: No Independently performs ADLs?: Yes (appropriate  for developmental age) Weakness of Legs: None Weakness of Arms/Hands: None  Home Assistive Devices/Equipment Home Assistive Devices/Equipment: None    Abuse/Neglect Assessment (Assessment to be complete while patient is alone) Physical Abuse: Denies Verbal Abuse: Denies Sexual Abuse: Denies Exploitation of patient/patient's resources: Denies Self-Neglect: Denies Values / Beliefs Cultural Requests During Hospitalization: None Spiritual Requests During Hospitalization: None Consults Spiritual Care Consult Needed: No Social Work Consult Needed: No Regulatory affairs officer (For Healthcare) Does patient have an advance directive?: Yes Type of Advance Directive: Living will    Additional Information 1:1 In Past 12 Months?: No CIRT Risk: No Elopement Risk: No Does patient have medical clearance?: Yes     Disposition:  Disposition Initial Assessment Completed for this Encounter: Yes Disposition of Patient: Referred to Patient referred to: Outpatient clinic referral Per Catalina Pizza NP  Claxton Levitz 07/05/2014 1:53 PM   Bedelia Person, M.S., LPCA, [facility] Licensed Professional Counselor Associate  Triage Specialist  [facility]  Therapeutic Triage Services Phone: [phone] Fax: [phone]

## 2014-07-05 NOTE — ED Notes (Signed)
Pt did not want discharge vitals.  Ready to go.

## 2014-07-05 NOTE — ED Notes (Signed)
Pt presents with depressive sx.  Denies SI/HI.  Pt states that he tried to get into see his doctor but was unable so doctor sent him to ER.

## 2014-07-05 NOTE — Telephone Encounter (Signed)
Schedule full today(20 patients); appt 12/23 or [facility] for depression

## 2014-07-05 NOTE — Discharge Instructions (Signed)

## 2014-07-05 NOTE — Telephone Encounter (Signed)
Returned pt's call and told him what Dr Linna Darner suggested. He said he would call back if he wanted to set up appt for 12/23.

## 2014-07-05 NOTE — BHH Counselor (Signed)
Faxed out referral information to [facility]. Talked to pt to explain referral options.   Bedelia Person, M.S., LPCA, [facility] Licensed Professional Counselor Associate  Triage Specialist  [facility]  Therapeutic Triage Services Phone: [phone] Fax: [phone]

## 2014-07-06 ENCOUNTER — Encounter: Payer: Self-pay | Admitting: Internal Medicine

## 2014-07-06 ENCOUNTER — Ambulatory Visit (INDEPENDENT_AMBULATORY_CARE_PROVIDER_SITE_OTHER): Payer: Medicare Other | Admitting: Internal Medicine

## 2014-07-06 ENCOUNTER — Other Ambulatory Visit (INDEPENDENT_AMBULATORY_CARE_PROVIDER_SITE_OTHER): Payer: Medicare Other

## 2014-07-06 VITALS — BP 162/90 | HR 62 | Temp 98.3°F | Ht 71.0 in | Wt 199.0 lb

## 2014-07-06 DIAGNOSIS — R7989 Other specified abnormal findings of blood chemistry: Secondary | ICD-10-CM

## 2014-07-06 DIAGNOSIS — F32A Depression, unspecified: Secondary | ICD-10-CM

## 2014-07-06 DIAGNOSIS — F329 Major depressive disorder, single episode, unspecified: Secondary | ICD-10-CM

## 2014-07-06 DIAGNOSIS — F4321 Adjustment disorder with depressed mood: Secondary | ICD-10-CM

## 2014-07-06 DIAGNOSIS — R946 Abnormal results of thyroid function studies: Secondary | ICD-10-CM

## 2014-07-06 MED ORDER — AMITRIPTYLINE HCL 10 MG PO TABS
10.0000 mg | ORAL_TABLET | Freq: Every day | ORAL | Status: DC
Start: 2014-07-06 — End: 2014-08-05

## 2014-07-06 NOTE — Patient Instructions (Addendum)
The amitriptyline Rx is an extremity small dose; it can be titrated based on your response to this.  Minimal Blood Pressure Goal= AVERAGE < 140/90;  Ideal is an AVERAGE < 135/85. This AVERAGE should be calculated from @ least 5-7 BP readings taken @ different times of day on different days of week. You should not respond to isolated BP readings , but rather the AVERAGE for that week .Please bring your  blood pressure cuff to office visits to verify that it is reliable.It  can also be checked against the blood pressure device at the pharmacy. Finger or wrist cuffs are not dependable; an arm cuff is.

## 2014-07-06 NOTE — Progress Notes (Signed)
Pre visit review using our clinic review tool, if applicable. No additional management support is needed unless otherwise documented below in the visit note. 

## 2014-07-06 NOTE — Progress Notes (Signed)
   Subjective:    Patient ID: Nathan [name], male    DOB: Apr 23, 1946, 68 y.o.   MRN: [id number]  HPI  The emergency room and [place] notes 07/05/14 were reviewed.Psychiatric appointment to be scheduled in 2/16  He states that he's had intermittent depression for years and has tried to treat this with therapeutic life change. He does not feel that these interventions have been of benefit  He describes no ambition and frank anhedonia.  There is no suicidal ideation.  His father did abuse alcohol. He knows of no other family history of mental health issues  He has been intolerant to Zoloft and Celexa. Apparently he had suboptimal response to Wellbutrin &  Effexor also.  He does note mood swings but not profound "highs".  His TSH earlier this year was 5.77.  Review of Systems   He sleeps well and does not have anorexia.     Objective:   Physical Exam  He is open and communicative. Oriented 3. He exhibits an honest wish to address these issues which are significantly problematic. Grade 5/8-0 systolic murmur present.  Gen.:  well-nourished; in no acute distress Eyes: Extraocular motion intact; no lid lag or proptosis ,no nystagmus Neck: full ROM; no masses ; thyroid normal  Heart: Normal rhythm and rate without gallop or extra heart sounds Lungs: Chest clear to auscultation without rales,rales, wheezes Neuro:Deep tendon reflexes are equal and within normal limits; no tremor  Skin: Warm and dry without significant lesions or rashes; no onycholysis Lymphatic: no cervical or axillary LA Psych: No abnormal mood or affect clinically.       Assessment & Plan:  #1 depression #2 slightly elevated TSH; R/O hypothyroidism Plan: Elavil titration

## 2014-07-07 ENCOUNTER — Telehealth: Payer: Self-pay

## 2014-07-07 LAB — TSH: TSH: 1.93 u[IU]/mL (ref 0.35–4.50)

## 2014-07-07 LAB — T4, FREE: Free T4: 0.94 ng/dL (ref 0.60–1.60)

## 2014-07-07 NOTE — Telephone Encounter (Signed)
Received a fax from patient's insurance, [employer] stating Amitriptyline has been approved for 12 months through 07/08/2015. Pharmacy has been advised.

## 2014-07-07 NOTE — Telephone Encounter (Signed)
Received a fax from [facility] in [place]. Amitriptyline HCL 10 mg requires prior authorization. This has been completed via cover my meds. Waiting for insurance response.

## 2014-08-05 ENCOUNTER — Other Ambulatory Visit: Payer: Self-pay | Admitting: Internal Medicine

## 2014-08-08 NOTE — Telephone Encounter (Signed)
OK but OV before additional refills

## 2014-09-04 ENCOUNTER — Other Ambulatory Visit: Payer: Self-pay | Admitting: Internal Medicine

## 2014-09-05 NOTE — Telephone Encounter (Signed)
OK X1 

## 2014-09-23 ENCOUNTER — Telehealth: Payer: Self-pay

## 2014-09-23 NOTE — Telephone Encounter (Signed)
Pt has a voicemail box that has not been set up.  RE: vaccine for flu 2015/2016 season.

## 2014-10-01 ENCOUNTER — Other Ambulatory Visit: Payer: Self-pay | Admitting: Internal Medicine

## 2014-10-03 NOTE — Telephone Encounter (Signed)
OK X1 

## 2014-11-04 ENCOUNTER — Other Ambulatory Visit: Payer: Self-pay | Admitting: Internal Medicine

## 2014-11-22 DIAGNOSIS — M546 Pain in thoracic spine: Secondary | ICD-10-CM | POA: Diagnosis not present

## 2014-11-22 DIAGNOSIS — M9903 Segmental and somatic dysfunction of lumbar region: Secondary | ICD-10-CM | POA: Diagnosis not present

## 2014-11-22 DIAGNOSIS — M545 Low back pain: Secondary | ICD-10-CM | POA: Diagnosis not present

## 2014-11-22 DIAGNOSIS — M9902 Segmental and somatic dysfunction of thoracic region: Secondary | ICD-10-CM | POA: Diagnosis not present

## 2014-11-25 DIAGNOSIS — M9903 Segmental and somatic dysfunction of lumbar region: Secondary | ICD-10-CM | POA: Diagnosis not present

## 2014-11-28 DIAGNOSIS — M9903 Segmental and somatic dysfunction of lumbar region: Secondary | ICD-10-CM | POA: Diagnosis not present

## 2014-11-30 DIAGNOSIS — M9903 Segmental and somatic dysfunction of lumbar region: Secondary | ICD-10-CM | POA: Diagnosis not present

## 2014-12-13 DIAGNOSIS — M9903 Segmental and somatic dysfunction of lumbar region: Secondary | ICD-10-CM | POA: Diagnosis not present

## 2014-12-16 DIAGNOSIS — M9903 Segmental and somatic dysfunction of lumbar region: Secondary | ICD-10-CM | POA: Diagnosis not present

## 2014-12-19 DIAGNOSIS — M9903 Segmental and somatic dysfunction of lumbar region: Secondary | ICD-10-CM | POA: Diagnosis not present

## 2014-12-23 DIAGNOSIS — M9903 Segmental and somatic dysfunction of lumbar region: Secondary | ICD-10-CM | POA: Diagnosis not present

## 2014-12-30 DIAGNOSIS — M9903 Segmental and somatic dysfunction of lumbar region: Secondary | ICD-10-CM | POA: Diagnosis not present

## 2015-01-09 ENCOUNTER — Other Ambulatory Visit: Payer: Self-pay

## 2015-01-17 ENCOUNTER — Ambulatory Visit (INDEPENDENT_AMBULATORY_CARE_PROVIDER_SITE_OTHER): Payer: Medicare Other | Admitting: Internal Medicine

## 2015-01-17 ENCOUNTER — Encounter: Payer: Self-pay | Admitting: Internal Medicine

## 2015-01-17 VITALS — BP 158/90 | HR 80 | Temp 98.7°F | Resp 18 | Wt 198.0 lb

## 2015-01-17 DIAGNOSIS — K5730 Diverticulosis of large intestine without perforation or abscess without bleeding: Secondary | ICD-10-CM

## 2015-01-17 DIAGNOSIS — R935 Abnormal findings on diagnostic imaging of other abdominal regions, including retroperitoneum: Secondary | ICD-10-CM

## 2015-01-17 NOTE — Progress Notes (Signed)
   Subjective:    Patient ID: Nathan [name], male    DOB: [birth year], 69 y.o.   MRN: [id number]  HPI His Chiropractor, Dr. Jimmye Norman performed films of the spine in late April. There is a questionable "spot" in the intestine. In mid June the x-ray was repeated and the abnormality persisted.  He has no active GI or genitourinary symptoms.  It was his understanding that colonoscopy in December 2012 had revealed no findings. Actually he was found to have severe diverticulosis at that study.   Review of Systems Unexplained weight loss, abdominal pain, significant dyspepsia, dysphagia, melena, rectal bleeding, or persistently small caliber stools are denied. Dysuria, pyuria, hematuria, frequency, nocturia or polyuria are denied.     Objective:   Physical Exam  General appearance is one of good health and nourishment w/o distress.  Eyes: No conjunctival inflammation or scleral icterus is present.  Oral exam:Uvula not visualized. Dental hygiene is good; lips and gums are healthy appearing.There is no oropharyngeal erythema or exudate noted.   Heart:  Normal rate and regular rhythm. S1 and S2 normal without gallop, murmur, click, rub or other extra sounds     Lungs:Chest clear to auscultation; no wheezes, rhonchi,rales ,or rubs present.No increased work of breathing.   Abdomen: bowel sounds normal, soft and non-tender without masses, organomegaly or hernias noted.  No guarding or rebound . No tenderness over the flanks to percussion  Musculoskeletal: Able to lie flat and sit up without help. Negative straight leg raising bilaterally. Gait normal  Skin:Warm & dry.  Intact without suspicious lesions or rashes ; no jaundice or tenting  Lymphatic: No lymphadenopathy is noted about the head, neck, axilla, or inguinal areas.              Assessment & Plan:  #1 abnormal finding of abdomen on spine x-ray performed by Dr. Jimmye Norman, Chiropractry  #2 severe diverticulosis at colonoscopy  2012; asymptomatic  Plan: See lab orders. We may need actual Xray written report

## 2015-01-17 NOTE — Progress Notes (Signed)
Pre visit review using our clinic review tool, if applicable. No additional management support is needed unless otherwise documented below in the visit note. 

## 2015-01-17 NOTE — Patient Instructions (Signed)
  Your next office appointment will be determined based upon review of your pending labs  and  xrays  Those written interpretation of the lab results and instructions will be transmitted to you by mail for your records.  Critical results will be called.   Followup as needed for any active or acute issue. Please report any significant change in your symptoms. 

## 2015-01-19 ENCOUNTER — Other Ambulatory Visit (INDEPENDENT_AMBULATORY_CARE_PROVIDER_SITE_OTHER): Payer: Medicare Other

## 2015-01-19 DIAGNOSIS — R935 Abnormal findings on diagnostic imaging of other abdominal regions, including retroperitoneum: Secondary | ICD-10-CM

## 2015-01-19 DIAGNOSIS — K5730 Diverticulosis of large intestine without perforation or abscess without bleeding: Secondary | ICD-10-CM | POA: Diagnosis not present

## 2015-01-19 LAB — BASIC METABOLIC PANEL
BUN: 17 mg/dL (ref 6–23)
CALCIUM: 9 mg/dL (ref 8.4–10.5)
CO2: 27 meq/L (ref 19–32)
Chloride: 103 mEq/L (ref 96–112)
Creatinine, Ser: 1.21 mg/dL (ref 0.40–1.50)
GFR: 63.18 mL/min (ref >60.00)
Glucose, Bld: 120 mg/dL — ABNORMAL HIGH (ref 70–99)
Potassium: 4 mEq/L (ref 3.5–5.1)
SODIUM: 139 meq/L (ref 135–145)

## 2015-01-19 LAB — CBC WITH DIFFERENTIAL/PLATELET
Basophils Absolute: 0 10*3/uL (ref 0.0–0.1)
Basophils Relative: 0.4 % (ref 0.0–3.0)
EOS ABS: 0.1 10*3/uL (ref 0.0–0.7)
Eosinophils Relative: 1.6 % (ref 0.0–5.0)
HCT: 45.7 % (ref 39.0–52.0)
Hemoglobin: 15.4 g/dL (ref 13.0–17.0)
LYMPHS PCT: 26.2 % (ref 12.0–46.0)
Lymphs Abs: 1.9 10*3/uL (ref 0.7–4.0)
MCHC: 33.6 g/dL (ref 30.0–36.0)
MCV: 89.2 fl (ref 78.0–100.0)
MONO ABS: 0.7 10*3/uL (ref 0.1–1.0)
Monocytes Relative: 9.9 % (ref 3.0–12.0)
NEUTROS PCT: 61.9 % (ref 43.0–77.0)
Neutro Abs: 4.5 10*3/uL (ref 1.4–7.7)
Platelets: 240 10*3/uL (ref 150.0–400.0)
RBC: 5.13 Mil/uL (ref 4.22–5.81)
RDW: 14.7 % (ref 11.5–15.5)
WBC: 7.2 10*3/uL (ref 4.0–10.5)

## 2015-01-19 LAB — HEPATIC FUNCTION PANEL
ALBUMIN: 3.8 g/dL (ref 3.5–5.2)
ALK PHOS: 54 U/L (ref 39–117)
ALT: 15 U/L (ref 0–53)
AST: 18 U/L (ref 0–37)
BILIRUBIN TOTAL: 0.6 mg/dL (ref 0.2–1.2)
Bilirubin, Direct: 0.1 mg/dL (ref 0.0–0.3)
TOTAL PROTEIN: 6.5 g/dL (ref 6.0–8.3)

## 2015-01-20 ENCOUNTER — Other Ambulatory Visit (INDEPENDENT_AMBULATORY_CARE_PROVIDER_SITE_OTHER): Payer: Medicare Other

## 2015-01-20 DIAGNOSIS — M9902 Segmental and somatic dysfunction of thoracic region: Secondary | ICD-10-CM | POA: Diagnosis not present

## 2015-01-20 DIAGNOSIS — M545 Low back pain: Secondary | ICD-10-CM | POA: Diagnosis not present

## 2015-01-20 DIAGNOSIS — M546 Pain in thoracic spine: Secondary | ICD-10-CM | POA: Diagnosis not present

## 2015-01-20 DIAGNOSIS — R739 Hyperglycemia, unspecified: Secondary | ICD-10-CM

## 2015-01-20 DIAGNOSIS — M9903 Segmental and somatic dysfunction of lumbar region: Secondary | ICD-10-CM | POA: Diagnosis not present

## 2015-01-20 LAB — HEMOGLOBIN A1C: HEMOGLOBIN A1C: 5.4 % (ref 4.6–6.5)

## 2015-01-26 ENCOUNTER — Encounter: Payer: Self-pay | Admitting: Internal Medicine

## 2015-02-03 ENCOUNTER — Encounter: Payer: Self-pay | Admitting: Internal Medicine

## 2015-02-06 ENCOUNTER — Ambulatory Visit (INDEPENDENT_AMBULATORY_CARE_PROVIDER_SITE_OTHER): Payer: Medicare Other | Admitting: Internal Medicine

## 2015-02-06 ENCOUNTER — Encounter: Payer: Self-pay | Admitting: Internal Medicine

## 2015-02-06 ENCOUNTER — Other Ambulatory Visit (INDEPENDENT_AMBULATORY_CARE_PROVIDER_SITE_OTHER): Payer: Self-pay

## 2015-02-06 VITALS — BP 182/68 | HR 71 | Temp 98.2°F | Resp 16 | Wt 198.0 lb

## 2015-02-06 DIAGNOSIS — R1011 Right upper quadrant pain: Secondary | ICD-10-CM

## 2015-02-06 DIAGNOSIS — Z23 Encounter for immunization: Secondary | ICD-10-CM | POA: Diagnosis not present

## 2015-02-06 DIAGNOSIS — I10 Essential (primary) hypertension: Secondary | ICD-10-CM

## 2015-02-06 LAB — CBC WITH DIFFERENTIAL/PLATELET
Basophils Absolute: 0 10*3/uL (ref 0.0–0.1)
Basophils Relative: 0.5 % (ref 0.0–3.0)
Eosinophils Absolute: 0.1 10*3/uL (ref 0.0–0.7)
Eosinophils Relative: 1.7 % (ref 0.0–5.0)
HCT: 45.8 % (ref 39.0–52.0)
Hemoglobin: 15.4 g/dL (ref 13.0–17.0)
Lymphocytes Relative: 28.9 % (ref 12.0–46.0)
Lymphs Abs: 2.3 10*3/uL (ref 0.7–4.0)
MCHC: 33.7 g/dL (ref 30.0–36.0)
MCV: 88.9 fl (ref 78.0–100.0)
MONOS PCT: 7 % (ref 3.0–12.0)
Monocytes Absolute: 0.6 10*3/uL (ref 0.1–1.0)
NEUTROS ABS: 4.9 10*3/uL (ref 1.4–7.7)
Neutrophils Relative %: 61.9 % (ref 43.0–77.0)
PLATELETS: 273 10*3/uL (ref 150.0–400.0)
RBC: 5.15 Mil/uL (ref 4.22–5.81)
RDW: 14.4 % (ref 11.5–15.5)
WBC: 7.9 10*3/uL (ref 4.0–10.5)

## 2015-02-06 LAB — BASIC METABOLIC PANEL
BUN: 15 mg/dL (ref 6–23)
CALCIUM: 9.2 mg/dL (ref 8.4–10.5)
CO2: 29 mEq/L (ref 19–32)
CREATININE: 1.15 mg/dL (ref 0.40–1.50)
Chloride: 105 mEq/L (ref 96–112)
GFR: 66.99 mL/min (ref >60.00)
Glucose, Bld: 89 mg/dL (ref 70–99)
POTASSIUM: 3.9 meq/L (ref 3.5–5.1)
Sodium: 142 mEq/L (ref 135–145)

## 2015-02-06 LAB — URINALYSIS
BILIRUBIN URINE: NEGATIVE
Hgb urine dipstick: NEGATIVE
Ketones, ur: NEGATIVE
Leukocytes, UA: NEGATIVE
Nitrite: NEGATIVE
Total Protein, Urine: NEGATIVE
UROBILINOGEN UA: 0.2 (ref 0.0–1.0)
Urine Glucose: NEGATIVE
pH: 7 (ref 5.0–8.0)

## 2015-02-06 LAB — HEPATIC FUNCTION PANEL
ALT: 14 U/L (ref 0–53)
AST: 17 U/L (ref 0–37)
Albumin: 3.9 g/dL (ref 3.5–5.2)
Alkaline Phosphatase: 60 U/L (ref 39–117)
BILIRUBIN TOTAL: 0.5 mg/dL (ref 0.2–1.2)
Bilirubin, Direct: 0.1 mg/dL (ref 0.0–0.3)
Total Protein: 6.3 g/dL (ref 6.0–8.3)

## 2015-02-06 MED ORDER — AMLODIPINE BESYLATE 5 MG PO TABS: 5.0000 mg | ORAL_TABLET | Freq: Every day | ORAL | Status: DC

## 2015-02-06 NOTE — Progress Notes (Signed)
   Subjective:    Patient ID: Nathan [name], male    DOB: January 01, 1946, 69 y.o.   MRN: [id number]  HPI  When last seen he was not having GI symptoms. Last Tuesday 01/31/15 he had pain which lasted all day in the right upper quadrant up to level IX. He was unable to drive because of the pain. As of 7/20 it had improved. He now has a residual dull discomfort in this area.  He has no other GI or GU symptoms.  He questions constipation ;but he does have a bowel movement every day.  His previous right flank/hip pain improved with chiropractory intervention. Chiropractic films had suggested some possible GI process. I been unable to play the CD here in the office or on my home computer.   He "received bad news " today so he "thought (his) blood pressure would be up".He has been OFF CCB for "months". He has a blood pressure cuff at home but is not checking his blood pressure. He denies any cardiovascular symptoms .  Review of Systems  Unexplained weight loss, abdominal pain, significant dyspepsia, dysphagia, melena, rectal bleeding, or persistently small caliber stools are denied.   Dysuria, pyuria, hematuria, frequency, nocturia or polyuria are denied.   Chest pain, palpitations, tachycardia, exertional dyspnea, paroxysmal nocturnal dyspnea, claudication or edema are absent.      Objective:   Physical Exam  Pertinent or positive findings include: Alopecia is present. He has intermittent grade 1/2 systolic murmur. There is an irregular epidermoid cyst over the right back. He has crepitus of the knees.  General appearance :adequately nourished; in no distress.  Eyes: No conjunctival inflammation or scleral icterus is present.  Oral exam:  Lips and gums are healthy appearing.There is no oropharyngeal erythema or exudate noted. Dental hygiene is good.  Heart:  Normal rate and regular rhythm. S1 and S2 normal without gallop, click, rub or other extra sounds    Lungs:Chest clear to  auscultation; no wheezes, rhonchi,rales ,or rubs present.No increased work of breathing.   Abdomen: bowel sounds normal, soft and non-tender without masses, organomegaly or hernias noted.  No guarding or rebound. No flank tenderness to percussion.  Vascular : all pulses equal ; no bruits present.  Skin:Warm & dry.  Intact without suspicious lesions or rashes ; no tenting or jaundice   Lymphatic: No lymphadenopathy is noted about the head, neck, axilla.   Neuro: Strength, tone & DTRs normal.        Assessment & Plan:  #1RUQ abdominal pain #2 hypertension See orders

## 2015-02-06 NOTE — Progress Notes (Signed)
Pre visit review using our clinic review tool, if applicable. No additional management support is needed unless otherwise documented below in the visit note. 

## 2015-02-06 NOTE — Patient Instructions (Signed)

## 2015-02-07 LAB — URINE CULTURE
Colony Count: NO GROWTH
ORGANISM ID, BACTERIA: NO GROWTH

## 2015-02-13 ENCOUNTER — Ambulatory Visit
Admission: RE | Admit: 2015-02-13 | Discharge: 2015-02-13 | Disposition: A | Discharge status: Home / Self Care | Payer: Medicare Other | Source: Ambulatory Visit | Attending: Internal Medicine | Admitting: Internal Medicine

## 2015-02-13 ENCOUNTER — Other Ambulatory Visit: Payer: Self-pay | Admitting: Internal Medicine

## 2015-02-13 ENCOUNTER — Encounter: Payer: Self-pay | Admitting: Internal Medicine

## 2015-02-13 DIAGNOSIS — K8020 Calculus of gallbladder without cholecystitis without obstruction: Secondary | ICD-10-CM | POA: Diagnosis not present

## 2015-02-13 DIAGNOSIS — R1011 Right upper quadrant pain: Secondary | ICD-10-CM

## 2015-02-14 ENCOUNTER — Encounter: Payer: Self-pay | Admitting: Internal Medicine

## 2015-02-17 DIAGNOSIS — M545 Low back pain: Secondary | ICD-10-CM | POA: Diagnosis not present

## 2015-02-17 DIAGNOSIS — M546 Pain in thoracic spine: Secondary | ICD-10-CM | POA: Diagnosis not present

## 2015-02-17 DIAGNOSIS — M9903 Segmental and somatic dysfunction of lumbar region: Secondary | ICD-10-CM | POA: Diagnosis not present

## 2015-02-17 DIAGNOSIS — M9902 Segmental and somatic dysfunction of thoracic region: Secondary | ICD-10-CM | POA: Diagnosis not present

## 2015-02-24 DIAGNOSIS — M9902 Segmental and somatic dysfunction of thoracic region: Secondary | ICD-10-CM | POA: Diagnosis not present

## 2015-02-24 DIAGNOSIS — M545 Low back pain: Secondary | ICD-10-CM | POA: Diagnosis not present

## 2015-02-24 DIAGNOSIS — M546 Pain in thoracic spine: Secondary | ICD-10-CM | POA: Diagnosis not present

## 2015-02-24 DIAGNOSIS — M9903 Segmental and somatic dysfunction of lumbar region: Secondary | ICD-10-CM | POA: Diagnosis not present

## 2015-02-27 ENCOUNTER — Encounter: Payer: Self-pay | Admitting: Internal Medicine

## 2015-02-28 ENCOUNTER — Other Ambulatory Visit: Payer: Self-pay | Admitting: Internal Medicine

## 2015-02-28 DIAGNOSIS — R937 Abnormal findings on diagnostic imaging of other parts of musculoskeletal system: Secondary | ICD-10-CM

## 2015-03-02 ENCOUNTER — Other Ambulatory Visit: Payer: Self-pay | Admitting: Surgery

## 2015-03-02 DIAGNOSIS — R1011 Right upper quadrant pain: Secondary | ICD-10-CM | POA: Diagnosis not present

## 2015-03-02 DIAGNOSIS — R109 Unspecified abdominal pain: Secondary | ICD-10-CM | POA: Diagnosis not present

## 2015-03-10 DIAGNOSIS — M545 Low back pain: Secondary | ICD-10-CM | POA: Diagnosis not present

## 2015-03-10 DIAGNOSIS — M546 Pain in thoracic spine: Secondary | ICD-10-CM | POA: Diagnosis not present

## 2015-03-10 DIAGNOSIS — M9903 Segmental and somatic dysfunction of lumbar region: Secondary | ICD-10-CM | POA: Diagnosis not present

## 2015-03-10 DIAGNOSIS — M9902 Segmental and somatic dysfunction of thoracic region: Secondary | ICD-10-CM | POA: Diagnosis not present

## 2015-03-22 ENCOUNTER — Ambulatory Visit (INDEPENDENT_AMBULATORY_CARE_PROVIDER_SITE_OTHER)
Admission: RE | Admit: 2015-03-22 | Discharge: 2015-03-22 | Disposition: A | Discharge status: Home / Self Care | Payer: Medicare Other | Source: Ambulatory Visit | Attending: Internal Medicine | Admitting: Internal Medicine

## 2015-03-22 DIAGNOSIS — R937 Abnormal findings on diagnostic imaging of other parts of musculoskeletal system: Secondary | ICD-10-CM

## 2015-03-22 DIAGNOSIS — M47816 Spondylosis without myelopathy or radiculopathy, lumbar region: Secondary | ICD-10-CM | POA: Diagnosis not present

## 2015-04-28 ENCOUNTER — Encounter: Payer: Self-pay | Admitting: Internal Medicine

## 2015-04-28 ENCOUNTER — Other Ambulatory Visit: Payer: Self-pay | Admitting: Internal Medicine

## 2015-04-28 DIAGNOSIS — R109 Unspecified abdominal pain: Principal | ICD-10-CM

## 2015-04-28 DIAGNOSIS — R935 Abnormal findings on diagnostic imaging of other abdominal regions, including retroperitoneum: Secondary | ICD-10-CM

## 2015-04-28 DIAGNOSIS — K5730 Diverticulosis of large intestine without perforation or abscess without bleeding: Secondary | ICD-10-CM

## 2015-04-28 DIAGNOSIS — K8020 Calculus of gallbladder without cholecystitis without obstruction: Secondary | ICD-10-CM

## 2015-04-28 DIAGNOSIS — G8929 Other chronic pain: Secondary | ICD-10-CM

## 2015-05-16 ENCOUNTER — Encounter: Payer: Self-pay | Admitting: Physician Assistant

## 2015-05-18 ENCOUNTER — Other Ambulatory Visit: Payer: Self-pay | Admitting: Emergency Medicine

## 2015-05-18 DIAGNOSIS — I10 Essential (primary) hypertension: Secondary | ICD-10-CM

## 2015-05-18 MED ORDER — AMLODIPINE BESYLATE 5 MG PO TABS: 5.0000 mg | ORAL_TABLET | Freq: Every day | ORAL | Status: DC

## 2015-05-31 DIAGNOSIS — H25041 Posterior subcapsular polar age-related cataract, right eye: Secondary | ICD-10-CM | POA: Diagnosis not present

## 2015-05-31 DIAGNOSIS — H25011 Cortical age-related cataract, right eye: Secondary | ICD-10-CM | POA: Diagnosis not present

## 2015-05-31 DIAGNOSIS — H2511 Age-related nuclear cataract, right eye: Secondary | ICD-10-CM | POA: Diagnosis not present

## 2015-05-31 DIAGNOSIS — H2512 Age-related nuclear cataract, left eye: Secondary | ICD-10-CM | POA: Diagnosis not present

## 2015-06-06 DIAGNOSIS — M1711 Unilateral primary osteoarthritis, right knee: Secondary | ICD-10-CM | POA: Diagnosis not present

## 2015-06-06 DIAGNOSIS — M25561 Pain in right knee: Secondary | ICD-10-CM | POA: Diagnosis not present

## 2015-06-16 ENCOUNTER — Encounter: Payer: Self-pay | Admitting: Physician Assistant

## 2015-06-16 ENCOUNTER — Ambulatory Visit (INDEPENDENT_AMBULATORY_CARE_PROVIDER_SITE_OTHER): Payer: Medicare Other | Admitting: Physician Assistant

## 2015-06-16 VITALS — BP 146/88 | HR 64 | Ht 70.0 in | Wt 196.0 lb

## 2015-06-16 DIAGNOSIS — K5730 Diverticulosis of large intestine without perforation or abscess without bleeding: Secondary | ICD-10-CM

## 2015-06-16 DIAGNOSIS — R140 Abdominal distension (gaseous): Secondary | ICD-10-CM | POA: Diagnosis not present

## 2015-06-16 DIAGNOSIS — K5900 Constipation, unspecified: Secondary | ICD-10-CM

## 2015-06-16 DIAGNOSIS — K589 Irritable bowel syndrome without diarrhea: Secondary | ICD-10-CM | POA: Diagnosis not present

## 2015-06-16 MED ORDER — HYOSCYAMINE SULFATE 0.125 MG SL SUBL: 0.1250 mg | SUBLINGUAL_TABLET | Freq: Four times a day (QID) | SUBLINGUAL | Status: DC | PRN

## 2015-06-16 NOTE — Patient Instructions (Addendum)
We have given you information on a high fiber diet to read and review.  Please start Benefiber in 6 oz of water twice a day.  FiberOne cereal.  P FRUITS  Peaches  Plums  Pears  Prunes  Pineapple  Pear juice  Your physician has requested that you go to the basement for the following lab work before leaving today: H.Pylori stool  We have sent the following medications to your pharmacy for you to pick up at your convenience: Levsin SL 1 by mouth every 6 hours as needed   Please call the office to make a 6 week follow up. [phone]

## 2015-06-16 NOTE — Progress Notes (Signed)
Agree with assessment and plan. He should follow up if pain persists, may consider cross sectional imaging in this setting.

## 2015-06-16 NOTE — Progress Notes (Signed)
Patient ID: [name], male   DOB: [birth year], 69 y.o.   MRN: [id number]    HPI:  Nathan [name] is a 69 y.o.   male  referred by Hendricks Limes, MD for evaluation of abdominal pain. [name] was previously known to Dr. Olevia Perches. He had a colonoscopy on 06/27/2011 that revealed severe diverticulosis in the sigmoid colon but was otherwise a normal exam.  [name] reports that he has been having intermittent right-sided abdominal pain since June. His pain seems to be more noticeable in the morning when he gets up and it dissipates if he can pass gas or move his bowels. His pain is not associated with nausea or vomiting. He has no associated fever, chills, or night sweats. Occasionally he will have pain across the low back as well. He has no urinary symptoms. He was seen by his primary care provider and had an ultrasound of the gallbladder that revealed cholelithiasis with no cholecystitis. Bile ducts were normal. Labs were normal. He was evaluated by Dr. Johney Maine of [facility] surgery and was told his discomfort was not biliary in nature. He has no associated heartburn, belching, burping, dysphagia, or early satiety.  He does report that his bowel movements are not as regular as they used to be. He used to have a bowel movement every day but for the past 6 months he skips one or 2 days between bowel movements. When he does go he has to strain and passes a hard bowel movement and shortly thereafter will have a regular formed bowel movement. Week he felt so constipated that he had to use a suppository. This week he increased fiber and water in his diet and states he is going to the bathroom much better and has not had any pain since he is moving his bowels more regularly. He has not had any abnormal weight loss. He does report that he had an intestinal infection in [place] several years ago. He questions if he may have H. pylori.   Past Medical History  Diagnosis Date  . Osteoarthrosis, unspecified whether  generalized or localized, unspecified site   . Macular degeneration (senile) of retina, unspecified     2 different opinions from 2 different Ophth  . Prostatitis 2008  . Elevated prostate specific antigen (PSA) 2008    Dr Jeffie Pollock  . Other and unspecified hyperlipidemia   . Depressive disorder, not elsewhere classified   . Allergy     hay fever  . Hypertension     Past Surgical History  Procedure Laterality Date  . Tonsillectomy    . Hydrocoelectomy  2003    Dr Jeffie Pollock  . Prostate nodule resection  2003  . Cystoscopy      for hematuria  . Total knee arthroplasty  2009  . Knee arthroscopy  1994  . Colonoscopy  2012    Dr Olevia Perches   Family History  Problem Relation Age of Onset  . Lung cancer Father     smoker  . Alcohol abuse Father   . Diabetes Other   . Heart disease Neg Hx   . Stroke Neg Hx    Social History  Substance Use Topics  . Smoking status: Former Smoker    Quit date: 07/15/1968  . Smokeless tobacco: Never Used     Comment: smoked [place], up to 1ppd  . Alcohol Use: 4.2 oz/week    7 Glasses of wine per week     Comment: wine with dinner  Current Outpatient Prescriptions  Medication Sig Dispense Refill  . amitriptyline (ELAVIL) 10 MG tablet TAKE 1 TABLET BY MOUTH AT BEDTIME 30 tablet 5  . hyoscyamine (LEVSIN/SL) 0.125 MG SL tablet Place 1 tablet (0.125 mg total) under the tongue every 6 (six) hours as needed. 60 tablet 1   No current facility-administered medications for this visit.   Allergies  Allergen Reactions  . Citalopram Hydrobromide     Just ineffective  . Morphine And Related Other (See Comments)    Severe drop in blood pressure  . Sertraline Hcl     REACTION: headache     Review of Systems: Gen: Denies any fever, chills, sweats, anorexia, fatigue, weakness, malaise, weight loss, and sleep disorder CV: Denies chest pain, angina, palpitations, syncope, orthopnea, PND, peripheral edema, and claudication. Resp: Denies dyspnea at rest,  dyspnea with exercise, cough, sputum, wheezing, coughing up blood, and pleurisy. GI: Denies vomiting blood, jaundice, and fecal incontinence.   Denies dysphagia or odynophagia. Admits to right sided abdominal pain. GU : Denies urinary burning, blood in urine, urinary frequency, urinary hesitancy, nocturnal urination, and urinary incontinence. MS: Denies joint pain, limitation of movement, and swelling, stiffness, low back pain, extremity pain. Denies muscle weakness, cramps, atrophy.  Derm: Denies rash, itching, dry skin, hives, moles, warts, or unhealing ulcers.  Psych: Denies depression, anxiety, memory loss, suicidal ideation, hallucinations, paranoia, and confusion. Heme: Denies bruising, bleeding, and enlarged lymph nodes. Neuro:  Denies any headaches, dizziness, paresthesias. Endo:  Denies any problems with DM, thyroid, adrenal function  Studies: US Abdomen Complete     Study Result     CLINICAL DATA: Right upper quadrant pain  EXAM: ULTRASOUND ABDOMEN COMPLETE  COMPARISON: CT scan 02/21/2005  FINDINGS: Gallbladder: Mobile gallstone within gallbladder measures 7 mm. No thickening of gallbladder wall. No sonographic Murphy's sign.  Common bile duct: Diameter: 3.5 mm in diameter within normal limits.  Liver: No focal lesion identified. Within normal limits in parenchymal echogenicity.  IVC: No abnormality visualized.  Pancreas: Obscured by bowel gas.  Spleen: Size and appearance within normal limits. Measures 6.6 cm in length.  Right Kidney: Length: 11 cm. Echogenicity within normal limits. No mass or hydronephrosis visualized.  Left Kidney: Length: 11.5 cm. Echogenicity within normal limits. No mass or hydronephrosis visualized.  Abdominal aorta: No aneurysm visualized. Measures up to 2.6 cm in diameter.  Other findings: None.  IMPRESSION: 1. Mobile gallstone within gallbladder measures 7 mm. No thickening of gallbladder wall. No sonographic  Murphy's sign. 2. Normal CBD. 3. No hydronephrosis. 4. No aortic aneurysm.   Electronically Signed  By: Lahoma Crocker M.D.  On: 02/13/2015 08:37     LAB RESULTS: Hepatic function panel 02/06/2015 total bili 0.5, direct bili 0.1, alkaline phosphatase 60, AST 17, ALT 14.  Prior Endoscopies:   See history of present illness  Physical Exam: BP 146/88 mmHg  Pulse 64  Ht 5\' 10"  (1.778 m)  Wt 196 lb (88.905 kg)  BMI 28.12 kg/m2 Constitutional: Pleasant,well-developed, Caucasian male in no acute distress. HEENT: Normocephalic and atraumatic. Conjunctivae are normal. No scleral icterus. Neck supple. No JVD Cardiovascular: Normal rate, regular rhythm.  Pulmonary/chest: Effort normal and breath sounds normal. No wheezing, rales or rhonchi. Abdominal: Soft, nondistended, nontender. Bowel sounds active throughout. There are no masses palpable. No hepatomegaly. Extremities: no edema Lymphadenopathy: No cervical adenopathy noted. Neurological: Alert and oriented to person place and time. Skin: Skin is warm and dry. No rashes noted. Psychiatric: Normal mood and affect. Behavior is normal.  ASSESSMENT AND  PLAN: 69 year old male with a history of diverticular disease presenting with a 6 month history of right-sided abdominal pain. Pain seems to be alleviated with passage of gas or defecation. This may be functional in nature and due to some colon spasm. It seems to have been worse since he was constipated. He has been advised to adhere to a high-fiber diet. He will add "P fruits" to his diet. He will use Benefiber 1 heaping tablespoon in 6 ounces of water daily. He will also be given a trial of Levsin sublingual 0.125 mg one sublingually every 6 hours when necessary spasm. An H. pylori stool antigen will be obtained. He will follow up in 6 weeks to assess response, sooner if needed. If he has had no relief of his pain he may be considered for a HIDA scan, though his pain seems to be more  functional in nature.    Kaoir Loree, Deloris Ping 06/16/2015, 1:07 PM  CC: Hendricks Limes, MD

## 2015-06-19 ENCOUNTER — Other Ambulatory Visit: Payer: Medicare Other

## 2015-06-19 DIAGNOSIS — K589 Irritable bowel syndrome without diarrhea: Secondary | ICD-10-CM | POA: Diagnosis not present

## 2015-06-19 DIAGNOSIS — R140 Abdominal distension (gaseous): Secondary | ICD-10-CM | POA: Diagnosis not present

## 2015-06-19 DIAGNOSIS — K5730 Diverticulosis of large intestine without perforation or abscess without bleeding: Secondary | ICD-10-CM

## 2015-06-19 DIAGNOSIS — K5900 Constipation, unspecified: Secondary | ICD-10-CM | POA: Diagnosis not present

## 2015-06-20 DIAGNOSIS — H2511 Age-related nuclear cataract, right eye: Secondary | ICD-10-CM | POA: Diagnosis not present

## 2015-06-20 DIAGNOSIS — H25011 Cortical age-related cataract, right eye: Secondary | ICD-10-CM | POA: Diagnosis not present

## 2015-06-20 LAB — HELICOBACTER PYLORI  SPECIAL ANTIGEN: H. PYLORI Antigen: NOT DETECTED

## 2015-06-22 ENCOUNTER — Encounter: Payer: Self-pay | Admitting: *Deleted

## 2015-06-27 DIAGNOSIS — H2512 Age-related nuclear cataract, left eye: Secondary | ICD-10-CM | POA: Diagnosis not present

## 2015-06-27 DIAGNOSIS — H25012 Cortical age-related cataract, left eye: Secondary | ICD-10-CM | POA: Diagnosis not present

## 2015-08-24 ENCOUNTER — Encounter: Payer: Self-pay | Admitting: Family Medicine

## 2015-08-24 ENCOUNTER — Ambulatory Visit (INDEPENDENT_AMBULATORY_CARE_PROVIDER_SITE_OTHER): Payer: Medicare Other | Admitting: Family Medicine

## 2015-08-24 VITALS — BP 138/80 | HR 70 | Temp 98.3°F | Resp 20 | Ht 70.0 in | Wt 196.5 lb

## 2015-08-24 DIAGNOSIS — F329 Major depressive disorder, single episode, unspecified: Secondary | ICD-10-CM

## 2015-08-24 DIAGNOSIS — Z7189 Other specified counseling: Secondary | ICD-10-CM | POA: Diagnosis not present

## 2015-08-24 DIAGNOSIS — Z7689 Persons encountering health services in other specified circumstances: Secondary | ICD-10-CM

## 2015-08-24 DIAGNOSIS — F4321 Adjustment disorder with depressed mood: Secondary | ICD-10-CM | POA: Diagnosis not present

## 2015-08-24 DIAGNOSIS — I10 Essential (primary) hypertension: Secondary | ICD-10-CM

## 2015-08-24 DIAGNOSIS — Z974 Presence of external hearing-aid: Secondary | ICD-10-CM | POA: Diagnosis not present

## 2015-08-24 DIAGNOSIS — F411 Generalized anxiety disorder: Secondary | ICD-10-CM

## 2015-08-24 DIAGNOSIS — R1011 Right upper quadrant pain: Secondary | ICD-10-CM

## 2015-08-24 DIAGNOSIS — G8929 Other chronic pain: Secondary | ICD-10-CM

## 2015-08-24 DIAGNOSIS — R1010 Upper abdominal pain, unspecified: Secondary | ICD-10-CM

## 2015-08-24 MED ORDER — AMLODIPINE BESYLATE 5 MG PO TABS: ORAL_TABLET | ORAL | Status: DC

## 2015-08-24 NOTE — Progress Notes (Signed)
Patient ID: Nathan [name], male   DOB: Jan 02, 1946, 70 y.o.   MRN: [id number]      Patient ID: Nathan [name], male  DOB: Sep 01, 1945, 70 y.o.   MRN: [id number]  Subjective:  Nathan [name] is a 70 y.o. male present for establishment care with new provider, within the [facility] system with complaints. All past medical history, surgical history, allergies, family history, immunizations, medications and social history were updated in the electronic medical record today. All recent labs, ED visits and hospitalizations within the last year were reviewed.  RUQ pain: Pt states he is still having right upper quadrant discomfort. This is chronic in nature and has occurs since July 2016. He feels that his symptoms are worse in the morning upon first awakening, and started to subside has a upright for some time. This has been ongoing for greater than 6 months. He has been evaluated by prior PCP, surgery and GI. His colonoscopy was completed in 2012 by Dr. Olevia Perches, with noted diverticulosis, otherwise normal. Reviewed all patient's lab work, per PCP notes, and GI notes. It appears patient did have a gallstone on imaging. He was seen by [facility] gastroenterology in December 2016. It appears patient has been tried on IBS medications by them, which he doesn't have currently on his active list. He was supposed to follow-up with them in 6 weeks to discuss in the were considering a HIDA scan at that point. H. Pylori stool antigen negative at that time. Patient states he did not realize he was supposed to follow-up, he has continued to have unchanged right upper quadrant discomfort and does want further investigation.  Depression:  He recalls the first time he and his prior PCP addressed his depression was 2004 he was tried on many different times of medicine. He states he never felt any of them were effective more than a couple weeks. He would feel initial benefit, and then over the next couple weeks of using he would taper back  off because he says they were working. He states he does not like to take medications. He does not believe pharmacological assistance should be used for any disease process unless holistic approaches do not prove beneficial. Per EMR record, it appears Elavil  was the last considered medication, he states he doesn't recall starting that medication. Patient's pH Q 14, with very difficult to carry out his daily work/home environment. Generalized anxiety assessment 11, with somewhat difficult to carry out his daily work/home life environment. Patient states he is making 2017 his year of health. He reports he is exercising more frequently, watching his diet more closely how wants to have a complete physical and become more healthy. He states he wants to know if he has cancer and wants to have his stomach issues resolved because he is going to travel outside the country again and wants to be perfectly healthy when he does. Patient states he has never seeked counseling for his depression/anxiety. He states he feels although does present is not taking over his life. He declines any pharmacological advice/initiation of treatment today. He has had citalopram and Zoloft listed his allergy list.  Hypertension: Patient has a history of hypertension, reports compliance with amlodipine 5 mg daily. Patient has a history of hyperlipidemia, not on a statin medication. Does not appear to take a daily aspirin. Last GFR greater than 60 July 2016. Heart disease in his father.  Health maintenance:  Colonoscopy: 2012, no polyps, diverticula noted. 10 year follow up. Immunizations: tdap 2014,  Declines Flu vac. PNA vaccinations indicated >65 Infectious disease screening: Hep C indicated PSA: elevated in the past 5.77 ??? Follow up? Eye exam: 07/2015   Past Medical History  Diagnosis Date  . Osteoarthrosis, unspecified whether generalized or localized, unspecified site   . Macular degeneration (senile) of retina, unspecified      2 different opinions from 2 different Ophth  . Prostatitis 2008  . Elevated prostate specific antigen (PSA) 2008    Dr Jeffie Pollock  . Other and unspecified hyperlipidemia   . Depressive disorder, not elsewhere classified   . Allergy     hay fever  . Hypertension   . Anxiety   . Cataract   . Tuberculosis     had exposure as child tests positive on skin test.  . Diverticulosis   . Hx of migraines    Allergies  Allergen Reactions  . Citalopram Hydrobromide     Just ineffective  . Morphine And Related Other (See Comments)    Severe drop in blood pressure  . Sertraline Hcl     REACTION: headache   Past Surgical History  Procedure Laterality Date  . Tonsillectomy    . Hydrocoelectomy  2003    Dr Jeffie Pollock  . Prostate nodule resection  2003  . Cystoscopy      for hematuria  . Total knee arthroplasty  2009  . Knee arthroscopy  1994  . Colonoscopy  2012    Dr Olevia Perches  . Cataract extraction, bilateral     Family History  Problem Relation Age of Onset  . Lung cancer Father     smoker  . Alcohol abuse Father   . Tuberculosis Father   . Heart disease Father   . Early death Father   . Diabetes Other   . Stroke Neg Hx   . Heart disease Mother    Social History   Social History  . Marital Status: Divorced    Spouse Name: N/A  . Number of Children: N/A  . Years of Education: N/A   Occupational History  . Not on file.   Social History Main Topics  . Smoking status: Former Smoker    Quit date: 07/15/1968  . Smokeless tobacco: Never Used     Comment: smoked [place], up to 1ppd  . Alcohol Use: No  . Drug Use: No  . Sexual Activity: Yes    Birth Control/ Protection: None   Other Topics Concern  . Not on file   Social History Narrative    ROS: Negative, with the exception of above mentioned in HPI  Objective: BP 138/80 mmHg  Pulse 70  Temp(Src) 98.3 F (36.8 C)  Resp 20  Ht 5\' 10"  (1.778 m)  Wt 196 lb 8 oz (89.132 kg)  BMI 28.19 kg/m2  SpO2 95% Gen:  Afebrile. No acute distress. Nontoxic in appearance, well-developed, well-nourished, talkative, Caucasian male. HOH. HENT: AT. . MMM, no oral lesions, no Cough on exam, no hoarseness on exam. Eyes:Pupils Equal Round Reactive to light, Extraocular movements intact,  Conjunctiva without redness, discharge or icterus. Neck/lymp/endocrine: Supple, no lymphadenopathy CV: RRR, no edema, +2/4 P posterior tibialis pulses.  Chest: CTAB, no wheeze, rhonchi or crackles.  Abd: Soft. Round. NTND. BS present.  Skin: No rashes, purpura or petechiae. Warm and well-perfused. Skin intact. Neuro/Msk:Normal gait. PERLA. EOMi. Alert. Oriented x3.   Psych: Odd affect, normal dress and demeanor. Normal speech. Normal judgment.  Assessment/plan: DAGIM TOUSIGNANT is a 70 y.o. male present for establishment of  care with complaints of right upper quadrant pain and depression.  Reactive depression (situational) vs Major depressive d/o/anxiety - patient declines pharmacological assistance with his depression. Depression screening 14 and anxiety screening 11 completed today. - Discussed psychological/counseling/cognitive behavioral therapy with patient today. He states he will think about this. If he decides we can place referral without appointment. - Patient to continue exercising, he seems to have great benefit from increasing his exercise. - AVS on depression provided to patient today.  Essential hypertension, benign - Stable - Continue amlodipine 5 mg. Refills provided for 6 months. Continue exercise greater than 150 minutes week, low-salt diet, dietary modifications. - Follow-up every 6 months.  Right upper quadrant pain/chronic: - encouraged to follow-up with GI - They are currently in the middle of a work up on his pain, and he was recommended to follow-up with him, which he is overdue.   Pt needs/wants to schedule CPE/ medicare wellness. Will need to review all records in order to see when this is  indicated.   Howard Pouch, DO [place] than 45 minutes was spent with patient face to face, greater than 50% of that time was spent face-to-face with patient counseling and coordinating care discussing anxiety/depression and treatment options.

## 2015-08-24 NOTE — Patient Instructions (Signed)
It was a pleasure meeting you today. We will investigate in your chart the timing of your last well ness complete physical and call you to schedule Please investigate Cognitive Behavioral Therapy and if you are interested we can refer you.  Major Depressive Disorder Major depressive disorder is a mental illness. It also may be called clinical depression or unipolar depression. Major depressive disorder usually causes feelings of sadness, hopelessness, or helplessness. Some people with this disorder do not feel particularly sad but lose interest in doing things they used to enjoy (anhedonia). Major depressive disorder also can cause physical symptoms. It can interfere with work, school, relationships, and other normal everyday activities. The disorder varies in severity but is longer lasting and more serious than the sadness we all feel from time to time in our lives. Major depressive disorder often is triggered by stressful life events or major life changes. Examples of these triggers include divorce, loss of your job or home, a move, and the death of a family member or close friend. Sometimes this disorder occurs for no obvious reason at all. People who have family members with major depressive disorder or bipolar disorder are at higher risk for developing this disorder, with or without life stressors. Major depressive disorder can occur at any age. It may occur just once in your life (single episode major depressive disorder). It may occur multiple times (recurrent major depressive disorder). SYMPTOMS People with major depressive disorder have either anhedonia or depressed mood on nearly a daily basis for at least 2 weeks or longer. Symptoms of depressed mood include:  Feelings of sadness (blue or down in the dumps) or emptiness.  Feelings of hopelessness or helplessness.  Tearfulness or episodes of crying (may be observed by others).  Irritability (children and adolescents). In addition to  depressed mood or anhedonia or both, people with this disorder have at least four of the following symptoms:  Difficulty sleeping or sleeping too much.   Significant change (increase or decrease) in appetite or weight.   Lack of energy or motivation.  Feelings of guilt and worthlessness.   Difficulty concentrating, remembering, or making decisions.  Unusually slow movement (psychomotor retardation) or restlessness (as observed by others).   Recurrent wishes for death, recurrent thoughts of self-harm (suicide), or a suicide attempt. People with major depressive disorder commonly have persistent negative thoughts about themselves, other people, and the world. People with severe major depressive disorder may experiencedistorted beliefs or perceptions about the world (psychotic delusions). They also may see or hear things that are not real (psychotic hallucinations). DIAGNOSIS Major depressive disorder is diagnosed through an assessment by your health care provider. Your health care provider will ask aboutaspects of your daily life, such as mood,sleep, and appetite, to see if you have the diagnostic symptoms of major depressive disorder. Your health care provider may ask about your medical history and use of alcohol or drugs, including prescription medicines. Your health care provider also may do a physical exam and blood work. This is because certain medical conditions and the use of certain substances can cause major depressive disorder-like symptoms (secondary depression). Your health care provider also may refer you to a mental health specialist for further evaluation and treatment. TREATMENT It is important to recognize the symptoms of major depressive disorder and seek treatment. The following treatments can be prescribed for this disorder:   Medicine. Antidepressant medicines usually are prescribed. Antidepressant medicines are thought to correct chemical imbalances in the brain that  are commonly associated with  major depressive disorder. Other types of medicine may be added if the symptoms do not respond to antidepressant medicines alone or if psychotic delusions or hallucinations occur.  Talk therapy. Talk therapy can be helpful in treating major depressive disorder by providing support, education, and guidance. Certain types of talk therapy also can help with negative thinking (cognitive behavioral therapy) and with relationship issues that trigger this disorder (interpersonal therapy). A mental health specialist can help determine which treatment is best for you. Most people with major depressive disorder do well with a combination of medicine and talk therapy. Treatments involving electrical stimulation of the brain can be used in situations with extremely severe symptoms or when medicine and talk therapy do not work over time. These treatments include electroconvulsive therapy, transcranial magnetic stimulation, and vagal nerve stimulation.   This information is not intended to replace advice given to you by your health care provider. Make sure you discuss any questions you have with your health care provider.   Document Released: 10/26/2012 Document Revised: 07/22/2014 Document Reviewed: 10/26/2012 Elsevier Interactive Patient Education [employer].

## 2015-10-12 DIAGNOSIS — M1711 Unilateral primary osteoarthritis, right knee: Secondary | ICD-10-CM | POA: Diagnosis not present

## 2015-12-07 ENCOUNTER — Encounter: Payer: Self-pay | Admitting: Gastroenterology

## 2015-12-07 ENCOUNTER — Other Ambulatory Visit (INDEPENDENT_AMBULATORY_CARE_PROVIDER_SITE_OTHER): Payer: Medicare Other

## 2015-12-07 ENCOUNTER — Ambulatory Visit (INDEPENDENT_AMBULATORY_CARE_PROVIDER_SITE_OTHER): Payer: Medicare Other | Admitting: Gastroenterology

## 2015-12-07 VITALS — BP 150/80 | HR 62 | Ht 70.0 in | Wt 198.0 lb

## 2015-12-07 DIAGNOSIS — R5383 Other fatigue: Secondary | ICD-10-CM | POA: Diagnosis not present

## 2015-12-07 DIAGNOSIS — R238 Other skin changes: Secondary | ICD-10-CM

## 2015-12-07 DIAGNOSIS — R233 Spontaneous ecchymoses: Secondary | ICD-10-CM

## 2015-12-07 DIAGNOSIS — R1011 Right upper quadrant pain: Secondary | ICD-10-CM

## 2015-12-07 LAB — COMPREHENSIVE METABOLIC PANEL
ALBUMIN: 4.3 g/dL (ref 3.5–5.2)
ALK PHOS: 57 U/L (ref 39–117)
ALT: 17 U/L (ref 0–53)
AST: 18 U/L (ref 0–37)
BILIRUBIN TOTAL: 0.7 mg/dL (ref 0.2–1.2)
BUN: 18 mg/dL (ref 6–23)
CALCIUM: 9.5 mg/dL (ref 8.4–10.5)
CO2: 30 mEq/L (ref 19–32)
Chloride: 105 mEq/L (ref 96–112)
Creatinine, Ser: 1.11 mg/dL (ref 0.40–1.50)
GFR: 69.62 mL/min (ref >60.00)
Glucose, Bld: 87 mg/dL (ref 70–99)
Potassium: 4.2 mEq/L (ref 3.5–5.1)
Sodium: 140 mEq/L (ref 135–145)
Total Protein: 6.8 g/dL (ref 6.0–8.3)

## 2015-12-07 LAB — APTT: APTT: 31.2 s (ref 23.4–32.7)

## 2015-12-07 LAB — PROTIME-INR
INR: 1.1 ratio — AB (ref 0.8–1.0)
Prothrombin Time: 11 s (ref 9.6–13.1)

## 2015-12-07 LAB — CBC WITH DIFFERENTIAL/PLATELET
Basophils Absolute: 0 10*3/uL (ref 0.0–0.1)
Basophils Relative: 0.3 % (ref 0.0–3.0)
EOS PCT: 1.2 % (ref 0.0–5.0)
Eosinophils Absolute: 0.1 10*3/uL (ref 0.0–0.7)
HEMATOCRIT: 46 % (ref 39.0–52.0)
HEMOGLOBIN: 15.6 g/dL (ref 13.0–17.0)
LYMPHS ABS: 3 10*3/uL (ref 0.7–4.0)
Lymphocytes Relative: 31.3 % (ref 12.0–46.0)
MCHC: 33.9 g/dL (ref 30.0–36.0)
MCV: 88.6 fl (ref 78.0–100.0)
Monocytes Absolute: 0.7 10*3/uL (ref 0.1–1.0)
Monocytes Relative: 7.8 % (ref 3.0–12.0)
Neutro Abs: 5.6 10*3/uL (ref 1.4–7.7)
Neutrophils Relative %: 59.4 % (ref 43.0–77.0)
Platelets: 271 10*3/uL (ref 150.0–400.0)
RBC: 5.2 Mil/uL (ref 4.22–5.81)
RDW: 15.1 % (ref 11.5–15.5)
WBC: 9.5 10*3/uL (ref 4.0–10.5)

## 2015-12-07 NOTE — Patient Instructions (Addendum)
Your physician has requested that you go to the basement for the lab work before leaving today.    You have been scheduled for a CT scan of the abdomen and pelvis at [facility] ([address]---this is in the same building as [facility]).   You are scheduled on 12/14/15 at 9:30AM. You should arrive 15 minutes prior to your appointment time for registration. Please follow the written instructions below on the day of your exam:  WARNING: IF YOU ARE ALLERGIC TO IODINE/X-RAY DYE, PLEASE NOTIFY RADIOLOGY IMMEDIATELY AT [phone]! YOU WILL BE GIVEN A 13 HOUR PREMEDICATION PREP.  1) Do not eat or drink anything after 5:30AM (4 hours prior to your test) 2) You have been given 2 bottles of oral contrast to drink. The solution may taste  better if refrigerated, but do NOT add ice or any other liquid to this solution. Shake  well before drinking.    Drink 1 bottle of contrast @ 7:30AM (2 hours prior to your exam)  Drink 1 bottle of contrast @ 8:30AM (1 hour prior to your exam)  You may take any medications as prescribed with a small amount of water except for the following: Metformin, Glucophage, Glucovance, Avandamet, Riomet, Fortamet, Actoplus Met, Janumet, Glumetza or Metaglip. The above medications must be held the day of the exam AND 48 hours after the exam.  The purpose of you drinking the oral contrast is to aid in the visualization of your intestinal tract. The contrast solution may cause some diarrhea. Before your exam is started, you will be given a small amount of fluid to drink. Depending on your individual set of symptoms, you may also receive an intravenous injection of x-ray contrast/dye. Plan on being at [facility] for 30 minutes or longer, depending on the type of exam you are having performed.  This test typically takes 30-45 minutes to complete.   If you have any questions regarding your exam or if you need to reschedule, you may call the CT  department at [phone] between the hours of 8:00 am and 5:00 pm, Monday-Friday.  _______________________________________________________________________    Today your blood pressure was elevated. Please follow up with your Primary Care Provider for blood pressure management.   I appreciate the opportunity to care for you.   

## 2015-12-07 NOTE — Progress Notes (Signed)
HPI :  70 y/o male, here for a follow up visit and to establish care with me. PMH history as outlined below. He has been seen previously for abdominal pain.   He has been having intermittent abdominal pain for the past year or so. Pain located in the RUQ at present time, and sometimes radiates to low back.  He reports the pain usually will bother him at night when he sleeps, and wakes him up on most nights. He sleeps on both sides, typically bothers him at 3-4 AM and bothers him until he gets up. Once up and moving around the pain goes away. During the day it does not bother him at all. He denies any postprandial component. No nausea or vomiting. He reports it routinely wakes him up at night. He sleeps on both sides, unclear if changing positions makes it better. He does not think it is tender to the touch. No weight loss. Bowel habits stable, high fiber diet. He has a daily BM. He thinks sometimes having a BM can relieve it when he wakes up but he the pain generally goes away every AM once he wakes, and he has a BM every AM. No FH of colon cancer. No FH of pancreatic or liver cancer. He has been told he had gallstones in the past based on [place]. He was seen by Dr. Johney Maine of general surgery, who thought it was less likely to be the cause of his pain. He was given a trial of Levsin but has not yet tried it. He tested negative for H pylori. No prior upper endoscopy.   He incidentally notes easy bruising lately without any clear precipitants. He also endorses increased fatigue and low energy.   Colonoscopy 06/2011 - severe sigmoid diverticulosis, otherwise normal   Past Medical History  Diagnosis Date  . Osteoarthrosis, unspecified whether generalized or localized, unspecified site   . Macular degeneration (senile) of retina, unspecified     2 different opinions from 2 different Ophth  . Prostatitis 2008  . Elevated prostate specific antigen (PSA) 2008    Dr Jeffie Pollock  . Other and unspecified  hyperlipidemia   . Depressive disorder, not elsewhere classified   . Allergy     hay fever  . Hypertension   . Anxiety   . Cataract   . Diverticulosis   . Hx of migraines   . Tuberculosis     had exposure as child tests positive on skin test.  . IBS (irritable bowel syndrome)      Past Surgical History  Procedure Laterality Date  . Tonsillectomy    . Hydrocoelectomy  2003    Dr Jeffie Pollock  . Prostate nodule resection  2003  . Cystoscopy      for hematuria  . Total knee arthroplasty  2009  . Knee arthroscopy  1994  . Colonoscopy  2012    Dr Olevia Perches  . Cataract extraction, bilateral     Family History  Problem Relation Age of Onset  . Lung cancer Father     smoker  . Alcohol abuse Father   . Tuberculosis Father   . Heart disease Father   . Early death Father   . Diabetes Other   . Stroke Neg Hx   . Heart disease Mother    Social History  Substance Use Topics  . Smoking status: Former Smoker    Quit date: 07/15/1968  . Smokeless tobacco: Never Used     Comment: smoked [place], up to  1ppd  . Alcohol Use: No   Current Outpatient Prescriptions  Medication Sig Dispense Refill  . amLODipine (NORVASC) 5 MG tablet TAKE 1 TABLET (5 MG TOTAL) BY MOUTH DAILY. (Patient not taking: Reported on 12/07/2015) 90 tablet 1   No current facility-administered medications for this visit.   Allergies  Allergen Reactions  . Citalopram Hydrobromide     Just ineffective  . Morphine And Related Other (See Comments)    Severe drop in blood pressure  . Sertraline Hcl     REACTION: headache     Review of Systems: All systems reviewed and negative except where noted in HPI.   Lab Results  Component Value Date   ALT 17 12/07/2015   AST 18 12/07/2015   ALKPHOS 57 12/07/2015   BILITOT 0.7 12/07/2015    Lab Results  Component Value Date   CREATININE 1.11 12/07/2015   BUN 18 12/07/2015   NA 140 12/07/2015   K 4.2 12/07/2015   CL 105 12/07/2015   CO2 30 12/07/2015    Lab  Results  Component Value Date   WBC 9.5 12/07/2015   HGB 15.6 12/07/2015   HCT 46.0 12/07/2015   MCV 88.6 12/07/2015   PLT 271.0 12/07/2015     Physical Exam: BP 166/90 mmHg  Pulse 62  Ht 5\' 10"  (1.778 m)  Wt 198 lb (89.812 kg)  BMI 28.41 kg/m2 Constitutional: Pleasant,well-developed, male in no acute distress. HEENT: Normocephalic and atraumatic. Conjunctivae are normal. No scleral icterus. Neck supple.  Cardiovascular: Normal rate, regular rhythm.  Pulmonary/chest: Effort normal and breath sounds normal. No wheezing, rales or rhonchi. Abdominal: Soft, nondistended, nontender. Negative carnett. Bowel sounds active throughout. There are no masses palpable. No hepatomegaly. Extremities: no edema Lymphadenopathy: No cervical adenopathy noted. Neurological: Alert and oriented to person place and time. Skin: Skin is warm and dry. Ecchymosis noted on arms.  Psychiatric: Normal mood and affect. Behavior is normal.   ASSESSMENT AND PLAN: 70 y/o male presenting to address issues as outlined below:  RUQ pain - > 1 year, ongoing, nocturnal symptoms, not much pain during the day. Gallstones noted on [place] but his pain is not at all biliary colic. I think unlikely related to gallstones, and I don't think it's IBS. No prandial component, I think the yield of EGD is low. H pylori negative. Perhaps this is musculoskeletal but I could not elicit it on exam. I sent basic CBC and LFTs today which are normal. Given his nocturnal symptoms recommend cross sectional imaging to ensure normal pancreas and no intra-abdominal process. If it's normal we can consider HIDA given gallstones are noted and the location of his pain, but his symptoms do not sound biliary colic. He agreed with the plan  Cholelithiasis - as above, symptoms not c/w biliary colic, will await imaging  Easy bruising / low energy - noted on ROS. Will check coags, platelets are normal. No anemia. If this persists he should follow up with  primary care. Will await CT results.   Lanier Cellar, MD [facility] Pager [phone]

## 2015-12-14 ENCOUNTER — Ambulatory Visit (INDEPENDENT_AMBULATORY_CARE_PROVIDER_SITE_OTHER)
Admission: RE | Admit: 2015-12-14 | Discharge: 2015-12-14 | Disposition: A | Discharge status: Home / Self Care | Payer: Medicare Other | Source: Ambulatory Visit | Attending: Gastroenterology | Admitting: Gastroenterology

## 2015-12-14 ENCOUNTER — Other Ambulatory Visit: Payer: Self-pay | Admitting: *Deleted

## 2015-12-14 DIAGNOSIS — K5730 Diverticulosis of large intestine without perforation or abscess without bleeding: Secondary | ICD-10-CM | POA: Diagnosis not present

## 2015-12-14 DIAGNOSIS — R9389 Abnormal findings on diagnostic imaging of other specified body structures: Secondary | ICD-10-CM

## 2015-12-14 DIAGNOSIS — K4090 Unilateral inguinal hernia, without obstruction or gangrene, not specified as recurrent: Secondary | ICD-10-CM

## 2015-12-14 DIAGNOSIS — R1011 Right upper quadrant pain: Secondary | ICD-10-CM

## 2015-12-14 HISTORY — DX: Unilateral inguinal hernia, without obstruction or gangrene, not specified as recurrent: K40.90

## 2015-12-14 MED ORDER — IOPAMIDOL (ISOVUE-300) INJECTION 61%
100.0000 mL | Freq: Once | INTRAVENOUS | Status: AC | PRN
  Administered 2015-12-14: 100 mL via INTRAVENOUS

## 2015-12-15 ENCOUNTER — Other Ambulatory Visit (INDEPENDENT_AMBULATORY_CARE_PROVIDER_SITE_OTHER): Payer: Medicare Other

## 2015-12-15 DIAGNOSIS — R938 Abnormal findings on diagnostic imaging of other specified body structures: Secondary | ICD-10-CM | POA: Diagnosis not present

## 2015-12-15 DIAGNOSIS — R9389 Abnormal findings on diagnostic imaging of other specified body structures: Secondary | ICD-10-CM

## 2015-12-15 LAB — PSA: PSA: 4.61 ng/mL — ABNORMAL HIGH (ref 0.10–4.00)

## 2015-12-18 ENCOUNTER — Encounter: Payer: Self-pay | Admitting: Family Medicine

## 2015-12-18 ENCOUNTER — Ambulatory Visit (INDEPENDENT_AMBULATORY_CARE_PROVIDER_SITE_OTHER): Payer: Medicare Other | Admitting: Family Medicine

## 2015-12-18 VITALS — BP 162/78 | HR 74 | Temp 98.1°F | Resp 20 | Ht 70.0 in | Wt 196.8 lb

## 2015-12-18 DIAGNOSIS — E279 Disorder of adrenal gland, unspecified: Secondary | ICD-10-CM

## 2015-12-18 DIAGNOSIS — M47816 Spondylosis without myelopathy or radiculopathy, lumbar region: Secondary | ICD-10-CM

## 2015-12-18 DIAGNOSIS — E278 Other specified disorders of adrenal gland: Secondary | ICD-10-CM | POA: Insufficient documentation

## 2015-12-18 DIAGNOSIS — K5730 Diverticulosis of large intestine without perforation or abscess without bleeding: Secondary | ICD-10-CM | POA: Diagnosis not present

## 2015-12-18 DIAGNOSIS — I700 Atherosclerosis of aorta: Secondary | ICD-10-CM | POA: Insufficient documentation

## 2015-12-18 DIAGNOSIS — M898X5 Other specified disorders of bone, thigh: Secondary | ICD-10-CM

## 2015-12-18 DIAGNOSIS — N400 Enlarged prostate without lower urinary tract symptoms: Secondary | ICD-10-CM

## 2015-12-18 DIAGNOSIS — M899 Disorder of bone, unspecified: Secondary | ICD-10-CM

## 2015-12-18 DIAGNOSIS — K4090 Unilateral inguinal hernia, without obstruction or gangrene, not specified as recurrent: Secondary | ICD-10-CM

## 2015-12-18 DIAGNOSIS — I10 Essential (primary) hypertension: Secondary | ICD-10-CM

## 2015-12-18 DIAGNOSIS — N281 Cyst of kidney, acquired: Secondary | ICD-10-CM

## 2015-12-18 DIAGNOSIS — M4806 Spinal stenosis, lumbar region: Secondary | ICD-10-CM

## 2015-12-18 DIAGNOSIS — M48061 Spinal stenosis, lumbar region without neurogenic claudication: Secondary | ICD-10-CM

## 2015-12-18 DIAGNOSIS — R1011 Right upper quadrant pain: Secondary | ICD-10-CM

## 2015-12-18 DIAGNOSIS — G8929 Other chronic pain: Secondary | ICD-10-CM

## 2015-12-18 DIAGNOSIS — R1010 Upper abdominal pain, unspecified: Secondary | ICD-10-CM

## 2015-12-18 MED ORDER — AMLODIPINE BESYLATE 5 MG PO TABS: ORAL_TABLET | ORAL | Status: DC

## 2015-12-18 NOTE — Patient Instructions (Signed)
<  140/90 goal for Blood pressure. Restart amlodipine 5 mg and if BP not at goal followup in 1 week.  Low salt. Exercise > 150 minutes week. We will refer you to endocrine (adrenal adenoma) and Urology ( prostate enlargement and bone lesion) for the findings on CT.  We will watch your back and if any increase in symptoms will get MRI and send you to neurology for opinion.

## 2015-12-18 NOTE — Progress Notes (Signed)
Patient ID: Nathan [name], male   DOB: 1946/06/10, 70 y.o.   MRN: [id number]    Nathan [name] , 1946/05/16, 70 y.o., male MRN: [id number]  CC: Review CT results (ordered by GI) Subjective:   Hypertension: patient admits to not taking his blood pressure medication. He is prescribed amlodipine 5 mg daily. He states he noticed his BP was lower with taking the medication, but not much so he stopped taking it a few months ago. He denies headache, edema, chest pain or shortness of breath. He has not been exercising or watching his diet. He has being a  "BEAMER" therapy, that has helped with circulation.    Enlarged prostate: On CT completed for RUQ abdominal pain, an enlarged prostate and enlarging sclerotic bone lesion (left hip) 0.4--> 1.4 cm was noted. Patient states he has very mild hesitancy in urination in the middle of night (x1), but otherwise no changes in pattern or discomfort. He had seen Dr. Jeffie Pollock in the past (2001) for a prostate nodule biopsy, that was benign. His PSA this week 4.61, prior readings 5.77 and 4.76. Patient denies unintentional weight loss, bone pain, noght sweats or family hx of prostate cancer.   Adrenal mass: On CT completed for RUQ pain by GI, pt was found to have an right  adrenal mass, mildly enlarging from prior studies (1.7 --> 2.0 cm) with 46% washout. Pt was unaware of prior findings. CT also noted enlarging left trochanter bone lesion 0.4--> 1.4 cm. He denies bone pain, weight loss, night sweats. He has had increase anxiety, abdominal pain and decreased energy.   Low back Pain: On CT completed for RUQ pain by GI, Patient had significant findings for lower lumbar disease. Pt reports mild low back discomfort (right >left) and has seen a chiropractor for treatment over the last year.he denies any radiation of pain to lower extremities, bladder or bowel changes. He endorses mild to moderate daily discomfort in his lower back, than is worse in the morning and after sitting.  His discomfort improves with movement. He does not need or take daily medication for pain. He denies numbness or tingling in his lower extremities.    12/14/2015   CT  abdomen and pelvis with contrast   FINDINGS: Lower chest:  Unremarkable Hepatobiliary: Possible small gallstone on image 25/2. Pancreas: Unremarkable Spleen: Unremarkable Adrenals/Urinary Tract: 1.4 by 2.0 cm medial limb right adrenal mass, previously 1.0 by 1.7 cm, relative washout 46%, compatible with adenoma. Fluid density 1 cm lesion of the right kidney lower pole compatible with cyst, previously 5 mm in diameter. Stomach/Bowel: Sigmoid colon diverticulosis. Scattered descending colon diverticula. Appendix normal. No active diverticulitis identified. Vascular/Lymphatic: Aortoiliac atherosclerotic vascular disease. No pathologic adenopathy identified. Reproductive: Prominent prostate gland, 4.7 by 5.9 by 6.4 cm (volume = 92.3 cc). Other: No supplemental non-categorized findings. Musculoskeletal: 1.4 cm sclerotic lesion posteriorly in the left greater trochanter, previously 0.4 cm. No cortical destruction or extraosseous component. Mild degenerative findings in the sacroiliac joints. Multilevel subluxations with degenerative grade 1 retrolisthesis at L1- 2, L2- 3, and L3-4 ; and grade 1 anterolisthesis at L4-5. Spondylosis and degenerative disc disease in addition to the subluxations contribute to significant impingement at the L2-3, L3-4, and L4-5 levels, and potentially mild foraminal stenosis at L5-S1 as well. No pars defects. Multilevel Schmorl's nodes. A small umbilical hernia contains adipose tissue. Bilateral small direct inguinal hernias contain adipose tissue, larger on the left than the right.   IMPRESSION: 1. There is potentially a small gallstone  on image 25/2. No gallbladder wall thickening or additional significant hepatobiliary abnormality identified. 2. Small right adrenal adenoma. 3. Enlarged prostate gland, volume 92 cc. There  is a sclerotic lesion posteriorly in the left greater trochanter which is probably an enlarging bone island but which is significantly enlarged compared to 2006. I find this much less likely to be a solitary osseous metastatic lesion from prostate cancer, correlate with PSA level in determining need for further workup of the prostate gland. 4. Multilevel impingement in the lumbar spine due to spondylosis, degenerative subluxations, and degenerative disc disease. 5.  Aortoiliac atherosclerotic vascular disease. 6. Sigmoid colon and descending colon diverticulosis, without active diverticulitis identified. 7. Small direct bilateral inguinal hernias containing adipose tissue. Small umbilical hernia contains adipose tissue. Electronically Signed   By: Van Clines M.D.   On: 12/14/2015 09:58     Allergies  Allergen Reactions  . Citalopram Hydrobromide     Just ineffective  . Morphine And Related Other (See Comments)    Severe drop in blood pressure  . Sertraline Hcl     REACTION: headache   Social History  Substance Use Topics  . Smoking status: Former Smoker    Quit date: 07/15/1968  . Smokeless tobacco: Never Used     Comment: smoked [place], up to 1ppd  . Alcohol Use: No   Past Medical History  Diagnosis Date  . Osteoarthrosis, unspecified whether generalized or localized, unspecified site   . Macular degeneration (senile) of retina, unspecified     2 different opinions from 2 different Ophth  . Prostatitis 2008  . Elevated prostate specific antigen (PSA) 2008    Dr Jeffie Pollock  . Other and unspecified hyperlipidemia   . Depressive disorder, not elsewhere classified   . Allergy     hay fever  . Hypertension   . Anxiety   . Cataract   . Diverticulosis   . Hx of migraines   . Tuberculosis     had exposure as child tests positive on skin test.  . IBS (irritable bowel syndrome)    Past Surgical History  Procedure Laterality Date  . Tonsillectomy    . Hydrocoelectomy  2003      Dr Jeffie Pollock  . Prostate nodule resection  2003  . Cystoscopy      for hematuria  . Total knee arthroplasty  2009  . Knee arthroscopy  1994  . Colonoscopy  2012    Dr Olevia Perches  . Cataract extraction, bilateral     Family History  Problem Relation Age of Onset  . Lung cancer Father     smoker  . Alcohol abuse Father   . Tuberculosis Father   . Heart disease Father   . Early death Father   . Diabetes Other   . Stroke Neg Hx   . Heart disease Mother      Medication List    Notice  As of 12/18/2015 11:19 AM   You have not been prescribed any medications.     ROS: Negative, with the exception of above mentioned in HPI  Objective:  BP 162/78 mmHg  Pulse 74  Temp(Src) 98.1 F (36.7 C)  Resp 20  Ht 5\' 10"  (1.778 m)  Wt 196 lb 12 oz (89.245 kg)  BMI 28.23 kg/m2  SpO2 97% Body mass index is 28.23 kg/(m^2). Gen: Afebrile. No acute distress. Nontoxic in appearance, well developed, well nourished male. Pleasant.  HENT: AT. [place]. MMM, no oral lesions.  Eyes:Pupils Equal Round Reactive to  light, Extraocular movements intact,  Conjunctiva without redness, discharge or icterus. Neck/lymp/endocrine: Supple, No lymphadenopathy. CV: RRR, No edema Chest: CTAB, no wheeze or crackles.  Abd: Soft.  NTND. BS present Skin: No rashes, purpura or petechiae.  Neuro: Normal gait. PERLA. EOMi. Alert. Oriented x3 DTRs equal bilaterally. Psych: Normal affect, dress and demeanor. Normal speech. Normal thought content and judgment.  Assessment/Plan: Nathan [name] is a 70 y.o. male present for acute OV for  1. Enlarged prostate - Prostate has been enlarged, no frank symptoms. However PSA mildly above normal, prior h/o nodule bx, and enlarging sclerotic bone lesion. - Discussed with pt obtaining urology opinion considering bone lesion with PSA history. He is amenable to referral today.  - Ambulatory referral to Urology  2. Lytic bone lesion of left femur - Possibly benign finding, however it is  enlarging. Discussed sometimes this is an incidental finding, but should rule out other causes with the other findings he had on his CT.  - Ambulatory referral to Urology - Ambulatory referral to Endocrinology  3. Adrenal mass ([place]) - Discussed with pt this meets the criteria for a benign adrenal adenoma by size and wash out is just barely abnormal. However, with bone lesion enlarging, undiagnosed abd pain will send for endocrine evaluation.  - Ambulatory referral to Endocrinology  4. Diverticulosis of colon without hemorrhage - discussed finding on report and location.  - not symptomatic.   5. Abdominal pain, chronic, right upper quadrant - Still no answer for his RUQ daily abd pain. GI following.   6. Cyst of kidney, acquired, right - incidental find, appears cystic.   7. 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 Pain is mild and more arthritic in nature, not needing daily medications. Pt to make an appt if his pain changes or becomes more frequent. Would then proceed to MRI and send to Neurology. Pt agreeable with plan.   8. Direct inguinal hernia - Incidental findings, asymptomatic.  - Continue to monitor.  9. HTN/aortoiliac atherosclerotic disease - Education on effects of uncontrolled BP, low salt, exercise, diet, and goal BP.  -  restart 5 mg Amlodipine. And follow up in 1-2 weeks, if above goal  <140/90. - would increase amlodipine if needed, no edema on exam today.   Greater than 40 minutes was spent with patient, greater than 50% of that time was spent face-to-face with patient counseling and coordinating care.  electronically signed by:  Howard Pouch, DO  [place]

## 2015-12-25 ENCOUNTER — Encounter: Payer: Self-pay | Admitting: Family Medicine

## 2015-12-25 NOTE — Progress Notes (Signed)
Patient ID: Nathan [name], male   DOB: Nov 02, 1945, 70 y.o.   MRN: [id number] Documentation: Pt last appt to discuss abnormal findings on CT. He was agreeable to urology and endocrinology during OV to follow up on findings. Referral had been placed for patient. Upon calling to schedule referral he declined both.  Electronically Signed by: Howard Pouch, DO [place] [facility] [place]

## 2016-01-01 ENCOUNTER — Ambulatory Visit (INDEPENDENT_AMBULATORY_CARE_PROVIDER_SITE_OTHER): Payer: Medicare Other | Admitting: Family Medicine

## 2016-01-01 ENCOUNTER — Encounter: Payer: Self-pay | Admitting: Family Medicine

## 2016-01-01 VITALS — BP 159/82 | HR 69 | Temp 98.1°F | Resp 20 | Wt 193.5 lb

## 2016-01-01 DIAGNOSIS — I10 Essential (primary) hypertension: Secondary | ICD-10-CM

## 2016-01-01 DIAGNOSIS — T148XXA Other injury of unspecified body region, initial encounter: Secondary | ICD-10-CM

## 2016-01-01 DIAGNOSIS — E278 Other specified disorders of adrenal gland: Secondary | ICD-10-CM

## 2016-01-01 DIAGNOSIS — E279 Disorder of adrenal gland, unspecified: Secondary | ICD-10-CM | POA: Diagnosis not present

## 2016-01-01 DIAGNOSIS — T148 Other injury of unspecified body region: Secondary | ICD-10-CM

## 2016-01-01 NOTE — Progress Notes (Signed)
Patient ID: Nathan [name], male   DOB: 25-Aug-1945, 70 y.o.   MRN: [id number]    Nathan [name] , Jan 31, 1946, 70 y.o., male MRN: [id number]  CC: HTN Subjective:   Hypertension: patient presents for f/u on hypertension. He has restarted the amlodipine 5 mg and has only seen a mild change/improvement in his BP. He denies headache, edema, chest pain or shortness of breath. He has not been exercising or watching his diet. He does complain of easy bruising, but this has been going on for a few years. Last PCP felt it was from aging/thin skin. Pt is on tumeric supplementation and takes NSAIDS a few times a week PRN after golf games.   Allergies  Allergen Reactions  . Citalopram Hydrobromide     Just ineffective  . Morphine And Related Other (See Comments)    Severe drop in blood pressure  . Sertraline Hcl     REACTION: headache   Social History  Substance Use Topics  . Smoking status: Former Smoker    Quit date: 07/15/1968  . Smokeless tobacco: Never Used     Comment: smoked [place], up to 1ppd  . Alcohol Use: No   Past Medical History  Diagnosis Date  . Osteoarthrosis, unspecified whether generalized or localized, unspecified site   . Macular degeneration (senile) of retina, unspecified     2 different opinions from 2 different Ophth  . Prostatitis 2008  . Elevated prostate specific antigen (PSA) 2008    Dr Jeffie Pollock  . Other and unspecified hyperlipidemia   . Depressive disorder, not elsewhere classified   . Allergy     hay fever  . Hypertension   . Anxiety   . Cataract   . Diverticulosis   . Hx of migraines   . Tuberculosis     had exposure as child tests positive on skin test.  . IBS (irritable bowel syndrome)    Past Surgical History  Procedure Laterality Date  . Tonsillectomy    . Hydrocoelectomy  2003    Dr Jeffie Pollock  . Prostate nodule resection  2003  . Cystoscopy      for hematuria  . Total knee arthroplasty  2009  . Knee arthroscopy  1994  . Colonoscopy  2012   Dr Olevia Perches  . Cataract extraction, bilateral     Family History  Problem Relation Age of Onset  . Lung cancer Father     smoker  . Alcohol abuse Father   . Tuberculosis Father   . Heart disease Father   . Early death Father   . Diabetes Other   . Stroke Neg Hx   . Heart disease Mother      Medication List       This list is accurate as of: 01/01/16  2:46 PM.  Always use your most recent med list.               amLODipine 5 MG tablet  Commonly known as:  NORVASC  TAKE 1 TABLET (5 MG TOTAL) BY MOUTH DAILY.       ROS: Negative, with the exception of above mentioned in HPI  Objective:  BP 159/82 mmHg  Pulse 69  Temp(Src) 98.1 F (36.7 C)  Resp 20  Wt 193 lb 8 oz (87.771 kg)  SpO2 97% Body mass index is 27.76 kg/(m^2). Gen: Afebrile. No acute distress. Nontoxic in appearance, well developed, well nourished male. Pleasant.  HENT: AT. . MMM, no oral lesions.  Eyes:Pupils Equal Round Reactive  to light, Extraocular movements intact,  Conjunctiva without redness, discharge or icterus. CV: RRR, No edema Chest: CTAB, no wheeze or crackles.  Abd: Soft.  NTND. BS present Skin: No rashes, purpura or petechiae. Bruising right hand x2.  Neuro: Normal gait. PERLA. EOMi. Alert. Oriented x3 DTRs equal bilaterally. Psych: Normal affect, dress and demeanor. Normal speech. Normal thought content and judgment.  Assessment/Plan: Nathan [name] is a 70 y.o. male present for acute OV for  HTN/aortoiliac atherosclerotic disease - Education on effects of uncontrolled BP, low salt, exercise, diet, and goal BP.  -  Increase to  10 mg Amlodipine. - call in Friday, to disucss readings,  And follow up in 1-2 weeks, if above goal  <140/90. He will likely need lotensin 20 mg. He quit all meds bc did not see an improvement. He does have adrenal mass, referral to endocrine. Suspect noncompliance for cause of uncontrolled BP, but can not rule out without time since he is new to this  office.  Adrenal mass [facility]) - Ambulatory referral to Endocrinology - placed referral again per pt request  Bruising Stop tumeric. Labs over the last two years reassuring. If bruising does not resolve will consider further evaluation if he desires.   F/U 3 mos  electronically signed by:  Howard Pouch, DO  [place]

## 2016-01-01 NOTE — Patient Instructions (Addendum)
Increase amlodipine to 10 mg daily, call the Office Friday with recordings.  If above 140/90 routinely, I will want to see you and add another BP medication. I have also placed the endocrine referral again for you.

## 2016-01-03 DIAGNOSIS — T148XXA Other injury of unspecified body region, initial encounter: Secondary | ICD-10-CM | POA: Insufficient documentation

## 2016-01-03 MED ORDER — AMLODIPINE BESYLATE 10 MG PO TABS: ORAL_TABLET | ORAL | Status: DC

## 2016-01-09 DIAGNOSIS — M1711 Unilateral primary osteoarthritis, right knee: Secondary | ICD-10-CM | POA: Diagnosis not present

## 2016-01-22 ENCOUNTER — Other Ambulatory Visit (HOSPITAL_COMMUNITY): Payer: Self-pay

## 2016-01-22 DIAGNOSIS — M1711 Unilateral primary osteoarthritis, right knee: Secondary | ICD-10-CM | POA: Diagnosis not present

## 2016-02-02 ENCOUNTER — Inpatient Hospital Stay (HOSPITAL_COMMUNITY): Admission: RE | Admit: 2016-02-02 | Payer: Medicare Other | Source: Ambulatory Visit | Admitting: Orthopaedic Surgery

## 2016-02-02 ENCOUNTER — Encounter (HOSPITAL_COMMUNITY): Admission: RE | Payer: Self-pay | Source: Ambulatory Visit

## 2016-02-02 SURGERY — ARTHROPLASTY, KNEE, TOTAL
Anesthesia: Spinal | Site: Knee | Laterality: Right

## 2016-03-04 ENCOUNTER — Ambulatory Visit: Payer: Self-pay | Admitting: Endocrinology

## 2016-03-25 ENCOUNTER — Telehealth: Payer: Self-pay

## 2016-03-25 NOTE — Telephone Encounter (Signed)
Spoke with patient regarding [place], declined scheduling at this time.

## 2016-05-06 ENCOUNTER — Ambulatory Visit (INDEPENDENT_AMBULATORY_CARE_PROVIDER_SITE_OTHER): Payer: Medicare Other | Admitting: Orthopaedic Surgery

## 2016-05-06 DIAGNOSIS — M1711 Unilateral primary osteoarthritis, right knee: Secondary | ICD-10-CM | POA: Insufficient documentation

## 2016-05-06 MED ORDER — BUPIVACAINE HCL 0.5 % IJ SOLN
2.0000 mL | INTRAMUSCULAR | Status: AC | PRN
  Administered 2016-05-06: 2 mL via INTRA_ARTICULAR

## 2016-05-06 MED ORDER — BUPIVACAINE HCL 0.5 % IJ SOLN
2.0000 mL | Freq: Once | INTRAMUSCULAR | Status: AC
  Administered 2016-05-06: 2 mL

## 2016-05-06 MED ORDER — METHYLPREDNISOLONE ACETATE 40 MG/ML IJ SUSP
40.0000 mg | INTRAMUSCULAR | Status: AC | PRN
  Administered 2016-05-06: 40 mg via INTRA_ARTICULAR

## 2016-05-06 MED ORDER — LIDOCAINE HCL 1 % IJ SOLN
2.0000 mL | Freq: Once | INTRAMUSCULAR | Status: AC
Start: 2016-05-06 — End: 2016-05-06
  Administered 2016-05-06: 2 mL

## 2016-05-06 MED ORDER — LIDOCAINE HCL 1 % IJ SOLN
2.0000 mL | INTRAMUSCULAR | Status: AC | PRN
  Administered 2016-05-06: 2 mL

## 2016-05-06 MED ORDER — METHYLPREDNISOLONE ACETATE 40 MG/ML IJ SUSP
1.2000 mg | Freq: Once | INTRAMUSCULAR | Status: AC
  Administered 2016-05-06: 1.2 mg via INTRA_ARTICULAR

## 2016-05-06 NOTE — Progress Notes (Deleted)
   Procedure Note  Patient: Nathan [name]             Date of Birth: Jan 25, 1946           MRN: [id number]             Visit Date: 05/06/2016  Procedures: Visit Diagnoses: Unilateral primary osteoarthritis, right knee  Large Joint Inj Date/Time: 05/06/2016 10:02 AM Performed by: Leandrew Koyanagi Authorized by: Leandrew Koyanagi   Consent Given by:  Patient Timeout: prior to procedure the correct patient, procedure, and site was verified   Indications:  Pain Location:  Knee Site:  R knee Prep: patient was prepped and draped in usual sterile fashion   Needle Size:  22 G Approach:  Anterolateral Ultrasound Guidance: No   Fluoroscopic Guidance: No

## 2016-05-06 NOTE — Progress Notes (Signed)
Office Visit Note   Patient: Nathan [name]           Date of Birth: 1945/08/09           MRN: [id number] Visit Date: 05/06/2016              Requested by: Ma Hillock, DO [address] [place], [place] [address] PCP: Howard Pouch, DO   Assessment & Plan: Visit Diagnoses:  1. Unilateral primary osteoarthritis, right knee     Plan:  - right knee injection given - patient will call back to schedule TKA after [place] trip  Procedure note right knee injection verbal consent was obtained to inject knee joint  Timeout was completed to confirm the site of injection  The medications used were 40 mg of Depo-Medrol and 2 cc 1% lidocaine and 2 cc 0.5% bupivicaine  Anesthesia was provided by ethyl chloride and the skin was prepped with alcohol and betadine.  After cleaning the skin with alcohol a 22-gauge needle was used to inject the knee joint. There were no complications. A sterile bandage was applied.     Follow-Up Instructions: Return if symptoms worsen or fail to improve.   Orders:  Orders Placed This Encounter  Procedures  . Large Joint Injection/Arthrocentesis   Meds ordered this encounter  Medications  . lidocaine (XYLOCAINE) 1 % (with pres) injection 2 mL  . bupivacaine (MARCAINE) 0.5 % (with pres) injection 2 mL  . methylPREDNISolone acetate (DEPO-MEDROL) injection 1.2 mg      Procedures: Large Joint Inj Date/Time: 05/06/2016 12:11 PM Performed by: Leandrew Koyanagi Authorized by: Leandrew Koyanagi   Consent Given by:  Patient Timeout: prior to procedure the correct patient, procedure, and site was verified   Indications:  Pain Location:  Knee Site:  R knee Prep: patient was prepped and draped in usual sterile fashion   Needle Size:  22 G Approach:  Anterolateral Ultrasound Guidance: No   Fluoroscopic Guidance: No   Medications:  2 mL lidocaine 1 %; 2 mL bupivacaine 0.5 %; 40 mg methylPREDNISolone acetate 40 MG/ML      Clinical Data: No additional  findings.   Subjective: Chief Complaint  Patient presents with  . Right Knee - Follow-up    Comes in today for cortisone injection before his trip to [place].  He wants to do TKA in a few months.  synvisc helped partially.  Electromagnetic laser therapy is helping.      Review of Systems   Objective: Vital Signs: There were no vitals taken for this visit.  Physical Exam  Musculoskeletal:       Right knee: He exhibits no effusion.    Right Knee Exam   Other  Swelling: none Other tests: no effusion present  Comments:  Exam stable      Specialty Comments:  No specialty comments available.  Imaging: No results found.   PMFS History: Patient Active Problem List   Diagnosis Date Noted  . Unilateral primary osteoarthritis, right knee 05/06/2016  . Bruising 01/03/2016  . Enlarged prostate 12/18/2015  . lesion of left femur 12/18/2015  . Adrenal mass ([place]) 12/18/2015  . Cyst of kidney, acquired, right 12/18/2015  . Spondylosis of lumbar region without myelopathy or radiculopathy 12/18/2015  . Lumbar stenosis 12/18/2015  . Direct inguinal hernia 12/18/2015  . Atherosclerosis of aorta ([place]) 12/18/2015  . Hearing aid worn 08/24/2015  . Essential hypertension, benign 08/24/2015  . Abdominal pain, chronic, right upper quadrant 08/24/2015  . Gall  stone 02/13/2015  . Diverticulosis of colon without hemorrhage 01/17/2015  . Abnormal abdominal x-ray 01/17/2015  . Anxiety state 10/15/2007  . HYPERLIPIDEMIA 06/29/2007  . Reactive depression (situational) 06/29/2007  . MACULAR DEGENERATION 06/29/2007   Past Medical History:  Diagnosis Date  . Allergy    hay fever  . Anxiety   . Cataract   . Depressive disorder, not elsewhere classified   . Diverticulosis   . Elevated prostate specific antigen (PSA) 2008   Dr Jeffie Pollock  . Hx of migraines   . Hypertension   . IBS (irritable bowel syndrome)   . Macular degeneration (senile) of retina, unspecified    2 different  opinions from 2 different Ophth  . Osteoarthrosis, unspecified whether generalized or localized, unspecified site   . Other and unspecified hyperlipidemia   . Prostatitis 2008  . 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    Family History  Problem Relation Age of Onset  . Lung cancer Father     smoker  . Alcohol abuse Father   . Tuberculosis Father   . Heart disease Father   . Early death Father   . Diabetes Other   . Stroke Neg Hx   . Heart disease Mother     Past Surgical History:  Procedure Laterality Date  . CATARACT EXTRACTION, BILATERAL    . COLONOSCOPY  2012   Dr Olevia Perches  . CYSTOSCOPY     for hematuria  . hydrocoelectomy  2003   Dr Jeffie Pollock  . KNEE ARTHROSCOPY  1994  . prostate nodule resection  2003  . TONSILLECTOMY    . TOTAL KNEE ARTHROPLASTY  2009   Social History   Occupational History  . Not on file.   Social History Main Topics  . Smoking status: Former Smoker    Quit date: 07/15/1968  . Smokeless tobacco: Never Used     Comment: smoked [place], up to 1ppd  . Alcohol use No  . Drug use: No  . Sexual activity: Yes    Birth control/ protection: None

## 2016-06-05 ENCOUNTER — Telehealth (INDEPENDENT_AMBULATORY_CARE_PROVIDER_SITE_OTHER): Payer: Self-pay | Admitting: Orthopaedic Surgery

## 2016-06-06 DIAGNOSIS — R5381 Other malaise: Secondary | ICD-10-CM | POA: Diagnosis not present

## 2016-06-06 DIAGNOSIS — R509 Fever, unspecified: Secondary | ICD-10-CM | POA: Diagnosis not present

## 2016-06-10 ENCOUNTER — Ambulatory Visit (INDEPENDENT_AMBULATORY_CARE_PROVIDER_SITE_OTHER): Payer: Medicare Other | Admitting: Family Medicine

## 2016-06-10 ENCOUNTER — Encounter: Payer: Self-pay | Admitting: Family Medicine

## 2016-06-10 VITALS — BP 158/88 | HR 68 | Temp 98.8°F | Resp 20 | Wt 191.2 lb

## 2016-06-10 DIAGNOSIS — J0100 Acute maxillary sinusitis, unspecified: Secondary | ICD-10-CM

## 2016-06-10 DIAGNOSIS — R6889 Other general symptoms and signs: Secondary | ICD-10-CM

## 2016-06-10 DIAGNOSIS — I10 Essential (primary) hypertension: Secondary | ICD-10-CM | POA: Diagnosis not present

## 2016-06-10 LAB — POCT INFLUENZA A/B
INFLUENZA B, POC: NEGATIVE
Influenza A, POC: NEGATIVE

## 2016-06-10 MED ORDER — DOXYCYCLINE HYCLATE 100 MG PO TABS: 100.0000 mg | ORAL_TABLET | Freq: Two times a day (BID) | ORAL | 0 refills | Status: DC

## 2016-06-10 NOTE — Progress Notes (Signed)
Nathan [name] , 02-Oct-1945, 70 y.o., male MRN: [id number] Patient Care Team    Relationship Specialty Notifications Start End  Ma Hillock, DO PCP - General Family Medicine  12/07/15     CC: Body aches Subjective: Pt presents for an acute OV with complaints of body aches and nasal congestion of 6 days duration.  Associated symptoms include body aches, fever, congestion and productive cough. He reports a low grade fever at the start. He is better now than he was last week, but he is not feeling better. Tolerating PO, but decreased appetite. Pt has tried aleve to ease their symptoms.  Pt declined his flu shot this year. Pt traveled home from [place] last week and symptoms started after returning Tuesday night and he started feeling ill late Wednesday afternoon. His wife is also ill with similar symptoms.    Allergies  Allergen Reactions  . Citalopram Hydrobromide     Just ineffective  . Morphine And Related Other (See Comments)    Severe drop in blood pressure  . Sertraline Hcl     REACTION: headache   Social History  Substance Use Topics  . Smoking status: Former Smoker    Quit date: 07/15/1968  . Smokeless tobacco: Never Used     Comment: smoked [place], up to 1ppd  . Alcohol use No   Past Medical History:  Diagnosis Date  . Allergy    hay fever  . Anxiety   . Cataract   . Depressive disorder, not elsewhere classified   . Diverticulosis   . Elevated prostate specific antigen (PSA) 2008   Dr Jeffie Pollock  . Hx of migraines   . Hypertension   . IBS (irritable bowel syndrome)   . Macular degeneration (senile) of retina, unspecified    2 different opinions from 2 different Ophth  . Osteoarthrosis, unspecified whether generalized or localized, unspecified site   . Other and unspecified hyperlipidemia   . Prostatitis 2008  . 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   Past Surgical History:  Procedure Laterality Date  . CATARACT EXTRACTION,  BILATERAL    . COLONOSCOPY  2012   Dr Olevia Perches  . CYSTOSCOPY     for hematuria  . hydrocoelectomy  2003   Dr Jeffie Pollock  . KNEE ARTHROSCOPY  1994  . prostate nodule resection  2003  . TONSILLECTOMY    . TOTAL KNEE ARTHROPLASTY  2009   Family History  Problem Relation Age of Onset  . Lung cancer Father     smoker  . Alcohol abuse Father   . Tuberculosis Father   . Heart disease Father   . Early death Father   . Diabetes Other   . Heart disease Mother   . Stroke Neg Hx      Medication List       Accurate as of 06/10/16  8:53 AM. Always use your most recent med list.          amLODipine 10 MG tablet Commonly known as:  NORVASC TAKE 1 TABLET (5 MG TOTAL) BY MOUTH DAILY.       No results found for this or any previous visit (from the past 24 hour(s)). No results found.   ROS: Negative, with the exception of above mentioned in HPI   Objective:  BP (!) 158/88 (BP Location: Right Arm, Patient Position: Sitting, Cuff Size: Normal)   Pulse 68   Temp 98.8 F (37.1 C)  Resp 20   Wt 191 lb 4 oz (86.8 kg)   SpO2 97%   BMI 27.44 kg/m  Body mass index is 27.44 kg/m. Gen: Afebrile. No acute distress. Nontoxic in appearance, well developed, well nourished. Caucasian male.  HENT: AT. [place]. Bilateral TM visualized WNL. MMM, no oral lesions. Bilateral nares with erythema, tick green drainage. Throat with mild erythema, no exudates. Mild cough and hoarseness on exam. Eyes:Pupils Equal Round Reactive to light, Extraocular movements intact,  Conjunctiva without redness, discharge or icterus. Neck/lymp/endocrine: Supple,mild ant cervical  lymphadenopathy CV: RRR, no edema Chest: CTAB, no wheeze or crackles. Good air movement, normal resp effort.  Abd: Soft. NTND. BS present. no Masses palpated.  Skin: no rashes, purpura or petechiae.  Neuro:  Normal gait. PERLA. EOMi. Alert. Oriented x3   Results for orders placed or performed in visit on 06/10/16 (from the past 24 hour(s))  POCT  Influenza A/B     Status: Normal   Collection Time: 06/10/16  9:05 AM  Result Value Ref Range   Influenza A, POC Negative Negative   Influenza B, POC Negative Negative    Assessment/Plan: [name] is a 70 y.o. male present for acute OV for  Flu-like symptoms - POCT Influenza A/B--> negative Acute maxillary sinusitis, recurrence not specified Doxycyline every 12 hours for 10 days.  Plain mucinex, nasal saline Rest and hydrate.  F/U 1-2 weeks if not improving.   Essential hypertension, benign Pt encouraged to restart his BP medication. He stopped on his own and never tapered to amlodipine 10 mg.  F/U 3 months after restarting, as long as in normal parameters.    electronically signed by:  Howard Pouch, DO  [place]

## 2016-06-10 NOTE — Patient Instructions (Addendum)
Doxycyline every 12 hours for 10 days.  Plain mucinex, nasal saline Rest and hydrate.  Follow up 1-2 weeks if not improving.     Sinusitis, Adult Sinusitis is soreness and inflammation of your sinuses. Sinuses are hollow spaces in the bones around your face. Your sinuses are located:  Around your eyes.  In the middle of your forehead.  Behind your nose.  In your cheekbones. Your sinuses and nasal passages are lined with a stringy fluid (mucus). Mucus normally drains out of your sinuses. When your nasal tissues become inflamed or swollen, the mucus can become trapped or blocked so air cannot flow through your sinuses. This allows bacteria, viruses, and funguses to grow, which leads to infection. Sinusitis can develop quickly and last for 7?10 days (acute) or for more than 12 weeks (chronic). Sinusitis often develops after a cold. What are the causes? This condition is caused by anything that creates swelling in the sinuses or stops mucus from draining, including:  Allergies.  Asthma.  Bacterial or viral infection.  Abnormally shaped bones between the nasal passages.  Nasal growths that contain mucus (nasal polyps).  Narrow sinus openings.  Pollutants, such as chemicals or irritants in the air.  A foreign object stuck in the nose.  A fungal infection. This is rare. What increases the risk? The following factors may make you more likely to develop this condition:  Having allergies or asthma.  Having had a recent cold or respiratory tract infection.  Having structural deformities or blockages in your nose or sinuses.  Having a weak immune system.  Doing a lot of swimming or diving.  Overusing nasal sprays.  Smoking. What are the signs or symptoms? The main symptoms of this condition are pain and a feeling of pressure around the affected sinuses. Other symptoms include:  Upper toothache.  Earache.  Headache.  Bad breath.  Decreased sense of smell and  taste.  A cough that may get worse at night.  Fatigue.  Fever.  Thick drainage from your nose. The drainage is often green and it may contain pus (purulent).  Stuffy nose or congestion.  Postnasal drip. This is when extra mucus collects in the throat or back of the nose.  Swelling and warmth over the affected sinuses.  Sore throat.  Sensitivity to light. How is this diagnosed? This condition is diagnosed based on symptoms, a medical history, and a physical exam. To find out if your condition is acute or chronic, your health care provider may:  Look in your nose for signs of nasal polyps.  Tap over the affected sinus to check for signs of infection.  View the inside of your sinuses using an imaging device that has a light attached (endoscope). If your health care provider suspects that you have chronic sinusitis, you may also:  Be tested for allergies.  Have a sample of mucus taken from your nose (nasal culture) and checked for bacteria.  Have a mucus sample examined to see if your sinusitis is related to an allergy. If your sinusitis does not respond to treatment and it lasts longer than 8 weeks, you may have an MRI or CT scan to check your sinuses. These scans also help to determine how severe your infection is. In rare cases, a bone biopsy may be done to rule out more serious types of fungal sinus disease. How is this treated? Treatment for sinusitis depends on the cause and whether your condition is chronic or acute. If a virus is causing your  sinusitis, your symptoms will go away on their own within 10 days. You may be given medicines to relieve your symptoms, including:  Topical nasal decongestants. They shrink swollen nasal passages and let mucus drain from your sinuses.  Antihistamines. These drugs block inflammation that is triggered by allergies. This can help to ease swelling in your nose and sinuses.  Topical nasal corticosteroids. These are nasal sprays that  ease inflammation and swelling in your nose and sinuses.  Nasal saline washes. These rinses can help to get rid of thick mucus in your nose. If your condition is caused by bacteria, you will be given an antibiotic medicine. If your condition is caused by a fungus, you will be given an antifungal medicine. Surgery may be needed to correct underlying conditions, such as narrow nasal passages. Surgery may also be needed to remove polyps. Follow these instructions at home: Medicines  Take, use, or apply over-the-counter and prescription medicines only as told by your health care provider. These may include nasal sprays.  If you were prescribed an antibiotic medicine, take it as told by your health care provider. Do not stop taking the antibiotic even if you start to feel better. Hydrate and Humidify  Drink enough water to keep your urine clear or pale yellow. Staying hydrated will help to thin your mucus.  Use a cool mist humidifier to keep the humidity level in your home above 50%.  Inhale steam for 10-15 minutes, 3-4 times a day or as told by your health care provider. You can do this in the bathroom while a hot shower is running.  Limit your exposure to cool or dry air. Rest  Rest as much as possible.  Sleep with your head raised (elevated).  Make sure to get enough sleep each night. General instructions  Apply a warm, moist washcloth to your face 3-4 times a day or as told by your health care provider. This will help with discomfort.  Wash your hands often with soap and water to reduce your exposure to viruses and other germs. If soap and water are not available, use hand sanitizer.  Do not smoke. Avoid being around people who are smoking (secondhand smoke).  Keep all follow-up visits as told by your health care provider. This is important. Contact a health care provider if:  You have a fever.  Your symptoms get worse.  Your symptoms do not improve within 10 days. Get help  right away if:  You have a severe headache.  You have persistent vomiting.  You have pain or swelling around your face or eyes.  You have vision problems.  You develop confusion.  Your neck is stiff.  You have trouble breathing. This information is not intended to replace advice given to you by your health care provider. Make sure you discuss any questions you have with your health care provider. Document Released: 07/01/2005 Document Revised: 02/25/2016 Document Reviewed: 04/26/2015 Elsevier Interactive Patient Education  2017 [employer].

## 2016-06-11 ENCOUNTER — Ambulatory Visit: Payer: Self-pay | Admitting: Family Medicine

## 2016-06-11 NOTE — Telephone Encounter (Signed)
[name] scheduled with patient.

## 2016-06-14 ENCOUNTER — Other Ambulatory Visit (INDEPENDENT_AMBULATORY_CARE_PROVIDER_SITE_OTHER): Payer: Self-pay | Admitting: Orthopaedic Surgery

## 2016-06-14 DIAGNOSIS — M1711 Unilateral primary osteoarthritis, right knee: Secondary | ICD-10-CM

## 2016-06-18 NOTE — Pre-Procedure Instructions (Signed)
Nathan [name]  06/18/2016      [id number] - [place], [place] - [address] [place] [address] Phone: [phone] Fax: [phone]    Your procedure is scheduled on Thurs, Dec 14 @ 12:15 PM  Report to [facility] Admitting at 10:15 AM  Call this number if you have problems the morning of surgery:  [phone]   Remember:  Do not eat food or drink liquids after midnight.  Take these medicines the morning of surgery with A SIP OF WATER Amlodipine(Norvasc) and [place] Nasal Spray(if needed)             Stop taking your Naproxen along with any Vitamins or Herbal Medications a week prior to surgery. No Goody's,BC's,Aspirin,Ibuprofen,Motrin,Advil,or Fish Oil.   Do not wear jewelry.  Do not wear lotions, powders,colognes, or deoderant.             Men may shave face and neck.  Do not bring valuables to the hospital.  [facility] is not responsible for any belongings or valuables.  Contacts, dentures or bridgework may not be worn into surgery.  Leave your suitcase in the car.  After surgery it may be brought to your room.  For patients admitted to the hospital, discharge time will be determined by your treatment team.  Patients discharged the day of surgery will not be allowed to drive home.    [facility]  Before surgery, you can play an important role.  Because skin is not sterile, your skin needs to be as free of germs as possible.  You can reduce the number of germs on you skin by washing with CHG (chlorahexidine gluconate) soap before surgery.  CHG is an antiseptic cleaner which kills germs and bonds with the skin to continue killing germs even after washing.  Please DO NOT use if you have an allergy to CHG or antibacterial soaps.  If your skin becomes reddened/irritated stop using the CHG and inform your nurse when you arrive at Short Stay.  Do not shave (including legs  and underarms) for at least 48 hours prior to the first CHG shower.  You may shave your face.  Please follow these instructions carefully:   1.  Shower with CHG Soap the night before surgery and the                                morning of Surgery.  2.  If you choose to wash your hair, wash your hair first as usual with your       normal shampoo.  3.  After you shampoo, rinse your hair and body thoroughly to remove the                      Shampoo.  4.  Use CHG as you would any other liquid soap.  You can apply chg directly       to the skin and wash gently with scrungie or a clean washcloth.  5.  Apply the CHG Soap to your body ONLY FROM THE NECK DOWN.        Do not use on open wounds or open sores.  Avoid contact with your eyes,       ears, mouth and genitals (private parts).  Wash genitals (private parts)  with your normal soap.  6.  Wash thoroughly, paying special attention to the area where your surgery        will be performed.  7.  Thoroughly rinse your body with warm water from the neck down.  8.  DO NOT shower/wash with your normal soap after using and rinsing off       the CHG Soap.  9.  Pat yourself dry with a clean towel.            10.  Wear clean pajamas.            11.  Place clean sheets on your bed the night of your first shower and do not        sleep with pets.  Day of Surgery  Do not apply any lotions/deoderants the morning of surgery.  Please wear clean clothes to the hospital/surgery center.    Please read over the following fact sheets that you were given. Pain Booklet, Coughing and Deep Breathing, MRSA Information and Surgical Site Infection Prevention

## 2016-06-19 ENCOUNTER — Other Ambulatory Visit: Payer: Self-pay

## 2016-06-19 ENCOUNTER — Encounter (HOSPITAL_COMMUNITY)
Admission: RE | Admit: 2016-06-19 | Discharge: 2016-06-19 | Disposition: A | Discharge status: Home / Self Care | Payer: Medicare Other | Source: Ambulatory Visit | Attending: Orthopaedic Surgery | Admitting: Orthopaedic Surgery

## 2016-06-19 ENCOUNTER — Encounter (HOSPITAL_COMMUNITY): Payer: Self-pay

## 2016-06-19 DIAGNOSIS — R9431 Abnormal electrocardiogram [ECG] [EKG]: Secondary | ICD-10-CM | POA: Insufficient documentation

## 2016-06-19 DIAGNOSIS — M1711 Unilateral primary osteoarthritis, right knee: Secondary | ICD-10-CM | POA: Diagnosis not present

## 2016-06-19 DIAGNOSIS — Z01812 Encounter for preprocedural laboratory examination: Secondary | ICD-10-CM | POA: Diagnosis not present

## 2016-06-19 HISTORY — DX: Hypoglycemia, unspecified: E16.2

## 2016-06-19 HISTORY — DX: Adverse effect of unspecified anesthetic, initial encounter: T41.45XA

## 2016-06-19 HISTORY — DX: Other complications of anesthesia, initial encounter: T88.59XA

## 2016-06-19 HISTORY — DX: Reserved for inherently not codable concepts without codable children: IMO0001

## 2016-06-19 HISTORY — DX: Personal history of other medical treatment: Z92.89

## 2016-06-19 HISTORY — DX: Procedure and treatment not carried out because of patient's decision for reasons of belief and group pressure: Z53.1

## 2016-06-19 LAB — URINALYSIS, ROUTINE W REFLEX MICROSCOPIC
Bilirubin Urine: NEGATIVE
Glucose, UA: NEGATIVE mg/dL
Hgb urine dipstick: NEGATIVE
Ketones, ur: NEGATIVE mg/dL
LEUKOCYTES UA: NEGATIVE
NITRITE: NEGATIVE
PH: 7 (ref 5.0–8.0)
Protein, ur: NEGATIVE mg/dL
SPECIFIC GRAVITY, URINE: 1.005 (ref 1.005–1.030)

## 2016-06-19 LAB — NO BLOOD PRODUCTS

## 2016-06-19 LAB — CBC WITH DIFFERENTIAL/PLATELET
BASOS ABS: 0 10*3/uL (ref 0.0–0.1)
BASOS PCT: 0 %
Eosinophils Absolute: 0.1 10*3/uL (ref 0.0–0.7)
Eosinophils Relative: 1 %
HEMATOCRIT: 43.7 % (ref 39.0–52.0)
HEMOGLOBIN: 14.8 g/dL (ref 13.0–17.0)
Lymphocytes Relative: 28 %
Lymphs Abs: 2.8 10*3/uL (ref 0.7–4.0)
MCH: 29.5 pg (ref 26.0–34.0)
MCHC: 33.9 g/dL (ref 30.0–36.0)
MCV: 87.2 fL (ref 78.0–100.0)
MONO ABS: 0.7 10*3/uL (ref 0.1–1.0)
Monocytes Relative: 7 %
NEUTROS ABS: 6.4 10*3/uL (ref 1.7–7.7)
NEUTROS PCT: 64 %
Platelets: 414 10*3/uL — ABNORMAL HIGH (ref 150–400)
RBC: 5.01 MIL/uL (ref 4.22–5.81)
RDW: 14.2 % (ref 11.5–15.5)
WBC: 10 10*3/uL (ref 4.0–10.5)

## 2016-06-19 LAB — COMPREHENSIVE METABOLIC PANEL
ALBUMIN: 3.4 g/dL — AB (ref 3.5–5.0)
ALT: 19 U/L (ref 17–63)
AST: 20 U/L (ref 15–41)
Alkaline Phosphatase: 64 U/L (ref 38–126)
Anion gap: 8 (ref 5–15)
BILIRUBIN TOTAL: 0.6 mg/dL (ref 0.3–1.2)
BUN: 13 mg/dL (ref 6–20)
CO2: 28 mmol/L (ref 22–32)
Calcium: 9.1 mg/dL (ref 8.9–10.3)
Chloride: 104 mmol/L (ref 101–111)
Creatinine, Ser: 1.03 mg/dL (ref 0.61–1.24)
GFR calc Af Amer: >60 mL/min (ref >60)
GFR calc non Af Amer: >60 mL/min (ref >60)
GLUCOSE: 99 mg/dL (ref 65–99)
POTASSIUM: 3.6 mmol/L (ref 3.5–5.1)
Sodium: 140 mmol/L (ref 135–145)
TOTAL PROTEIN: 6.6 g/dL (ref 6.5–8.1)

## 2016-06-19 LAB — C-REACTIVE PROTEIN: CRP: 1 mg/dL — ABNORMAL HIGH (ref <1.0)

## 2016-06-19 LAB — SEDIMENTATION RATE: SED RATE: 17 mm/h — AB (ref 0–16)

## 2016-06-19 LAB — PROTIME-INR
INR: 1.05
Prothrombin Time: 13.7 seconds (ref 11.4–15.2)

## 2016-06-19 LAB — SURGICAL PCR SCREEN
MRSA, PCR: NEGATIVE
Staphylococcus aureus: NEGATIVE

## 2016-06-19 LAB — APTT: APTT: 29 s (ref 24–36)

## 2016-06-19 NOTE — Progress Notes (Signed)
Pt. Has document to refuse blood transfusion.  Pt. Signed refusal & it is faxed to blood bank & to Dr. Phoebe Sharps attention as well. Pt. Is followed by Dr. Shelba Flake at [facility] at [facility] for PCP. Pt. Reports that he had a stress test > 10 yrs. Ago & he was told after the results were furnished of being negative that he should reduce intake of  coffee. He reports that as soon as he decreased daily coffee his "flutters" that he had been feeling resolved. Pt. Reports that just 2 weeks ago started with a resp. Flu after a trip return from [place]. Pt. Was seen on Thanksgiving day at [facility] & a 2V CXR was done & he was told that it was wnl. Flu check proved negative as well.  Still feeling bad on the days following & had cough, fever etc., he was seen by PCP & started on Doxycycline.  Pt. Denies all chest, resp. distress today, has two days left of Doxycycline.  Requested via fax to send [place] the CXR from [place].  Pt. Reports significant hypoglycemia history, please see note in history relative to his concerns.  Pt. counselled to eat ^ protein leading 48 hrs.  up to surgery & to concentrate on less carbs & to keep well hydrated.

## 2016-06-20 NOTE — Progress Notes (Signed)
Anesthesia Chart Review:  Pt is a 70 year old male scheduled for R total knee arthroplasty on 06/27/2016 with Frankey Shown, MD.   Pt refuses all blood products  - PCP is Howard Pouch, DO  PMH includes:  HTN, hyperlipidemia. Former smoker. BMI 27  Medications include: amlodipine  Preoperative labs reviewed.   EKG 06/19/16: NSR. Possible Inferior infarct, age undetermined. No significant change since 03/26/00.  If no changes, I anticipate pt can proceed with surgery as scheduled.   Willeen Cass, FNP-BC [facility] Short Stay Surgical Center/Anesthesiology Phone: [phone] 06/20/2016 2:17 PM

## 2016-06-25 ENCOUNTER — Other Ambulatory Visit (HOSPITAL_COMMUNITY): Payer: Self-pay

## 2016-06-26 MED ORDER — SODIUM CHLORIDE 0.9 % IV SOLN
1000.0000 mg | INTRAVENOUS | Status: AC
  Administered 2016-06-27: 1000 mg via INTRAVENOUS
  Filled 2016-06-26: qty 10

## 2016-06-26 MED ORDER — BUPIVACAINE LIPOSOME 1.3 % IJ SUSP
20.0000 mL | INTRAMUSCULAR | Status: AC
  Administered 2016-06-27: 20 mL
  Filled 2016-06-26: qty 20

## 2016-06-27 ENCOUNTER — Encounter (HOSPITAL_COMMUNITY): Payer: Self-pay | Admitting: *Deleted

## 2016-06-27 ENCOUNTER — Encounter (HOSPITAL_COMMUNITY)
Admission: RE | Disposition: A | Discharge status: Home Health | Payer: Self-pay | Source: Ambulatory Visit | Attending: Orthopaedic Surgery

## 2016-06-27 ENCOUNTER — Inpatient Hospital Stay (HOSPITAL_COMMUNITY): Payer: Medicare Other | Admitting: Emergency Medicine

## 2016-06-27 ENCOUNTER — Inpatient Hospital Stay (HOSPITAL_COMMUNITY): Payer: Medicare Other

## 2016-06-27 ENCOUNTER — Inpatient Hospital Stay (HOSPITAL_COMMUNITY)
Admission: RE | Admit: 2016-06-27 | Discharge: 2016-06-29 | DRG: 470 | Disposition: A | Discharge status: Home Health | Payer: Medicare Other | Source: Ambulatory Visit | Attending: Orthopaedic Surgery | Admitting: Orthopaedic Surgery

## 2016-06-27 ENCOUNTER — Inpatient Hospital Stay (HOSPITAL_COMMUNITY): Payer: Medicare Other | Admitting: Anesthesiology

## 2016-06-27 DIAGNOSIS — M21379 Foot drop, unspecified foot: Secondary | ICD-10-CM | POA: Diagnosis present

## 2016-06-27 DIAGNOSIS — Z79899 Other long term (current) drug therapy: Secondary | ICD-10-CM

## 2016-06-27 DIAGNOSIS — R262 Difficulty in walking, not elsewhere classified: Secondary | ICD-10-CM

## 2016-06-27 DIAGNOSIS — G8918 Other acute postprocedural pain: Secondary | ICD-10-CM | POA: Diagnosis not present

## 2016-06-27 DIAGNOSIS — N358 Other urethral stricture: Secondary | ICD-10-CM | POA: Diagnosis not present

## 2016-06-27 DIAGNOSIS — Z96651 Presence of right artificial knee joint: Secondary | ICD-10-CM | POA: Diagnosis not present

## 2016-06-27 DIAGNOSIS — R338 Other retention of urine: Secondary | ICD-10-CM | POA: Diagnosis not present

## 2016-06-27 DIAGNOSIS — Z466 Encounter for fitting and adjustment of urinary device: Secondary | ICD-10-CM | POA: Diagnosis not present

## 2016-06-27 DIAGNOSIS — Z888 Allergy status to other drugs, medicaments and biological substances status: Secondary | ICD-10-CM

## 2016-06-27 DIAGNOSIS — I10 Essential (primary) hypertension: Secondary | ICD-10-CM | POA: Diagnosis present

## 2016-06-27 DIAGNOSIS — M25661 Stiffness of right knee, not elsewhere classified: Secondary | ICD-10-CM

## 2016-06-27 DIAGNOSIS — Z885 Allergy status to narcotic agent status: Secondary | ICD-10-CM

## 2016-06-27 DIAGNOSIS — Z791 Long term (current) use of non-steroidal anti-inflammatories (NSAID): Secondary | ICD-10-CM | POA: Diagnosis not present

## 2016-06-27 DIAGNOSIS — M1711 Unilateral primary osteoarthritis, right knee: Principal | ICD-10-CM | POA: Diagnosis present

## 2016-06-27 DIAGNOSIS — N359 Urethral stricture, unspecified: Secondary | ICD-10-CM | POA: Diagnosis not present

## 2016-06-27 DIAGNOSIS — Z471 Aftercare following joint replacement surgery: Secondary | ICD-10-CM | POA: Diagnosis not present

## 2016-06-27 DIAGNOSIS — Z9079 Acquired absence of other genital organ(s): Secondary | ICD-10-CM

## 2016-06-27 DIAGNOSIS — R7611 Nonspecific reaction to tuberculin skin test without active tuberculosis: Secondary | ICD-10-CM | POA: Diagnosis present

## 2016-06-27 DIAGNOSIS — D62 Acute posthemorrhagic anemia: Secondary | ICD-10-CM | POA: Diagnosis not present

## 2016-06-27 DIAGNOSIS — Z87891 Personal history of nicotine dependence: Secondary | ICD-10-CM | POA: Diagnosis not present

## 2016-06-27 DIAGNOSIS — N401 Enlarged prostate with lower urinary tract symptoms: Secondary | ICD-10-CM | POA: Diagnosis not present

## 2016-06-27 DIAGNOSIS — M25561 Pain in right knee: Secondary | ICD-10-CM

## 2016-06-27 DIAGNOSIS — Z96659 Presence of unspecified artificial knee joint: Secondary | ICD-10-CM | POA: Diagnosis not present

## 2016-06-27 HISTORY — PX: TOTAL KNEE ARTHROPLASTY: SHX125

## 2016-06-27 LAB — GLUCOSE, CAPILLARY
GLUCOSE-CAPILLARY: 91 mg/dL (ref 65–99)
Glucose-Capillary: 89 mg/dL (ref 65–99)
Glucose-Capillary: 79 mg/dL (ref 65–99)

## 2016-06-27 SURGERY — ARTHROPLASTY, KNEE, TOTAL
Anesthesia: Spinal | Site: Knee | Laterality: Right

## 2016-06-27 MED ORDER — DEXTROSE 5 % IV SOLN
500.0000 mg | Freq: Four times a day (QID) | INTRAVENOUS | Status: DC | PRN
  Filled 2016-06-27: qty 5

## 2016-06-27 MED ORDER — FENTANYL CITRATE (PF) 100 MCG/2ML IJ SOLN
INTRAMUSCULAR | Status: AC
  Administered 2016-06-27: 50 ug via INTRAVENOUS
  Filled 2016-06-27: qty 2

## 2016-06-27 MED ORDER — TRANEXAMIC ACID 1000 MG/10ML IV SOLN
INTRAVENOUS | Status: DC | PRN
  Administered 2016-06-27: 2000 mg via TOPICAL

## 2016-06-27 MED ORDER — OXYCODONE HCL ER 10 MG PO T12A
10.0000 mg | EXTENDED_RELEASE_TABLET | Freq: Two times a day (BID) | ORAL | Status: DC
  Administered 2016-06-27 – 2016-06-29 (×4): 10 mg via ORAL
  Filled 2016-06-27 (×4): qty 1

## 2016-06-27 MED ORDER — PROMETHAZINE HCL 25 MG PO TABS: 25.0000 mg | ORAL_TABLET | Freq: Four times a day (QID) | ORAL | 1 refills | Status: DC | PRN

## 2016-06-27 MED ORDER — DIPHENHYDRAMINE HCL 12.5 MG/5ML PO ELIX: 25.0000 mg | ORAL_SOLUTION | ORAL | Status: DC | PRN

## 2016-06-27 MED ORDER — CEFAZOLIN SODIUM-DEXTROSE 2-4 GM/100ML-% IV SOLN
2.0000 g | INTRAVENOUS | Status: AC
  Administered 2016-06-27: 2 g via INTRAVENOUS
  Filled 2016-06-27: qty 100

## 2016-06-27 MED ORDER — POVIDONE-IODINE 10 % EX SOLN
CUTANEOUS | Status: DC | PRN
  Administered 2016-06-27: 1 via TOPICAL

## 2016-06-27 MED ORDER — ACETAMINOPHEN 500 MG PO TABS
1000.0000 mg | ORAL_TABLET | Freq: Four times a day (QID) | ORAL | Status: AC
  Administered 2016-06-27 – 2016-06-28 (×4): 1000 mg via ORAL
  Filled 2016-06-27 (×4): qty 2

## 2016-06-27 MED ORDER — OXYCODONE HCL 5 MG PO TABS
5.0000 mg | ORAL_TABLET | ORAL | Status: DC | PRN
  Administered 2016-06-27 (×2): 10 mg via ORAL
  Administered 2016-06-28 (×4): 15 mg via ORAL
  Administered 2016-06-29: 10 mg via ORAL
  Administered 2016-06-29: 15 mg via ORAL
  Filled 2016-06-27 (×3): qty 2
  Filled 2016-06-27 (×4): qty 3
  Filled 2016-06-27: qty 1

## 2016-06-27 MED ORDER — METOCLOPRAMIDE HCL 5 MG PO TABS: 5.0000 mg | ORAL_TABLET | Freq: Three times a day (TID) | ORAL | Status: DC | PRN

## 2016-06-27 MED ORDER — OXYCODONE HCL 5 MG PO TABS
ORAL_TABLET | ORAL | Status: AC
  Administered 2016-06-27: 10 mg via ORAL
  Filled 2016-06-27: qty 2

## 2016-06-27 MED ORDER — PHENOL 1.4 % MT LIQD: 1.0000 | OROMUCOSAL | Status: DC | PRN

## 2016-06-27 MED ORDER — 0.9 % SODIUM CHLORIDE (POUR BTL) OPTIME
TOPICAL | Status: DC | PRN
  Administered 2016-06-27: 1000 mL

## 2016-06-27 MED ORDER — MAGNESIUM CITRATE PO SOLN: 1.0000 | Freq: Once | ORAL | Status: DC | PRN

## 2016-06-27 MED ORDER — POLYETHYLENE GLYCOL 3350 17 G PO PACK: 17.0000 g | PACK | Freq: Every day | ORAL | Status: DC | PRN

## 2016-06-27 MED ORDER — ROPIVACAINE HCL 7.5 MG/ML IJ SOLN
INTRAMUSCULAR | Status: DC | PRN
  Administered 2016-06-27: 20 mL via PERINEURAL

## 2016-06-27 MED ORDER — FENTANYL CITRATE (PF) 100 MCG/2ML IJ SOLN
INTRAMUSCULAR | Status: DC | PRN
  Administered 2016-06-27 (×2): 50 ug via INTRAVENOUS

## 2016-06-27 MED ORDER — KETOROLAC TROMETHAMINE 15 MG/ML IJ SOLN
15.0000 mg | Freq: Four times a day (QID) | INTRAMUSCULAR | Status: DC
  Administered 2016-06-28 – 2016-06-29 (×6): 15 mg via INTRAVENOUS
  Filled 2016-06-27 (×7): qty 1

## 2016-06-27 MED ORDER — ACETAMINOPHEN 325 MG PO TABS: 650.0000 mg | ORAL_TABLET | Freq: Four times a day (QID) | ORAL | Status: DC | PRN

## 2016-06-27 MED ORDER — PROPOFOL 10 MG/ML IV BOLUS
INTRAVENOUS | Status: AC
  Filled 2016-06-27: qty 40

## 2016-06-27 MED ORDER — MIDAZOLAM HCL 2 MG/2ML IJ SOLN
INTRAMUSCULAR | Status: AC
  Administered 2016-06-27: 2 mg
  Filled 2016-06-27: qty 2

## 2016-06-27 MED ORDER — METHOCARBAMOL 500 MG PO TABS
ORAL_TABLET | ORAL | Status: AC
  Administered 2016-06-27: 500 mg via ORAL
  Filled 2016-06-27: qty 1

## 2016-06-27 MED ORDER — CEFAZOLIN SODIUM-DEXTROSE 2-4 GM/100ML-% IV SOLN
2.0000 g | Freq: Four times a day (QID) | INTRAVENOUS | Status: AC
  Administered 2016-06-27 – 2016-06-28 (×2): 2 g via INTRAVENOUS
  Filled 2016-06-27 (×2): qty 100

## 2016-06-27 MED ORDER — FENTANYL CITRATE (PF) 100 MCG/2ML IJ SOLN
INTRAMUSCULAR | Status: AC
  Filled 2016-06-27: qty 2

## 2016-06-27 MED ORDER — LACTATED RINGERS IV SOLN
INTRAVENOUS | Status: DC
  Administered 2016-06-27 (×3): via INTRAVENOUS

## 2016-06-27 MED ORDER — SULFAMETHOXAZOLE-TRIMETHOPRIM 800-160 MG PO TABS
1.0000 | ORAL_TABLET | Freq: Two times a day (BID) | ORAL | Status: DC
  Administered 2016-06-27 – 2016-06-29 (×4): 1 via ORAL
  Filled 2016-06-27 (×4): qty 1

## 2016-06-27 MED ORDER — OXYCODONE HCL ER 10 MG PO T12A: 10.0000 mg | EXTENDED_RELEASE_TABLET | Freq: Two times a day (BID) | ORAL | 0 refills | Status: DC

## 2016-06-27 MED ORDER — SODIUM CHLORIDE 0.9 % IV SOLN: INTRAVENOUS | Status: DC

## 2016-06-27 MED ORDER — ASPIRIN EC 325 MG PO TBEC: 325.0000 mg | DELAYED_RELEASE_TABLET | Freq: Two times a day (BID) | ORAL | 0 refills | Status: DC

## 2016-06-27 MED ORDER — SODIUM CHLORIDE 0.9 % IJ SOLN
INTRAMUSCULAR | Status: DC | PRN
  Administered 2016-06-27: 40 mL

## 2016-06-27 MED ORDER — FENTANYL CITRATE (PF) 100 MCG/2ML IJ SOLN
25.0000 ug | INTRAMUSCULAR | Status: DC | PRN
Start: 2016-06-27 — End: 2016-06-27
  Administered 2016-06-27 (×2): 50 ug via INTRAVENOUS

## 2016-06-27 MED ORDER — MIDAZOLAM HCL 2 MG/2ML IJ SOLN
INTRAMUSCULAR | Status: AC
  Filled 2016-06-27: qty 2

## 2016-06-27 MED ORDER — ONDANSETRON HCL 4 MG PO TABS: 4.0000 mg | ORAL_TABLET | Freq: Three times a day (TID) | ORAL | 0 refills | Status: DC | PRN

## 2016-06-27 MED ORDER — SORBITOL 70 % SOLN: 30.0000 mL | Freq: Every day | Status: DC | PRN

## 2016-06-27 MED ORDER — PROPOFOL 500 MG/50ML IV EMUL
INTRAVENOUS | Status: DC | PRN
  Administered 2016-06-27: 50 ug/kg/min via INTRAVENOUS

## 2016-06-27 MED ORDER — SODIUM CHLORIDE 0.9 % IR SOLN
Status: DC | PRN
  Administered 2016-06-27: 3000 mL
  Administered 2016-06-27: 1000 mL

## 2016-06-27 MED ORDER — ALUM & MAG HYDROXIDE-SIMETH 200-200-20 MG/5ML PO SUSP: 30.0000 mL | ORAL | Status: DC | PRN

## 2016-06-27 MED ORDER — TRANEXAMIC ACID 1000 MG/10ML IV SOLN
2000.0000 mg | INTRAVENOUS | Status: DC
  Filled 2016-06-27: qty 20

## 2016-06-27 MED ORDER — CHLORHEXIDINE GLUCONATE 4 % EX LIQD: 60.0000 mL | Freq: Once | CUTANEOUS | Status: DC

## 2016-06-27 MED ORDER — ONDANSETRON HCL 4 MG/2ML IJ SOLN
4.0000 mg | Freq: Four times a day (QID) | INTRAMUSCULAR | Status: DC | PRN
  Administered 2016-06-27: 4 mg via INTRAVENOUS
  Filled 2016-06-27: qty 2

## 2016-06-27 MED ORDER — METOCLOPRAMIDE HCL 5 MG/ML IJ SOLN: 5.0000 mg | Freq: Three times a day (TID) | INTRAMUSCULAR | Status: DC | PRN

## 2016-06-27 MED ORDER — AMLODIPINE BESYLATE 10 MG PO TABS
10.0000 mg | ORAL_TABLET | Freq: Every day | ORAL | Status: DC
  Administered 2016-06-28 – 2016-06-29 (×2): 10 mg via ORAL
  Filled 2016-06-27 (×2): qty 1

## 2016-06-27 MED ORDER — PROPOFOL 10 MG/ML IV BOLUS
INTRAVENOUS | Status: DC | PRN
  Administered 2016-06-27: 10 mg via INTRAVENOUS

## 2016-06-27 MED ORDER — FENTANYL CITRATE (PF) 100 MCG/2ML IJ SOLN
INTRAMUSCULAR | Status: AC
  Administered 2016-06-27: 100 ug
  Filled 2016-06-27: qty 2

## 2016-06-27 MED ORDER — FENTANYL CITRATE (PF) 100 MCG/2ML IJ SOLN
100.0000 ug | Freq: Once | INTRAMUSCULAR | Status: DC
  Filled 2016-06-27: qty 2

## 2016-06-27 MED ORDER — ACETAMINOPHEN 650 MG RE SUPP: 650.0000 mg | Freq: Four times a day (QID) | RECTAL | Status: DC | PRN

## 2016-06-27 MED ORDER — SODIUM CHLORIDE 0.9 % IV SOLN
1000.0000 mg | Freq: Once | INTRAVENOUS | Status: AC
  Administered 2016-06-27: 1000 mg via INTRAVENOUS
  Filled 2016-06-27: qty 10

## 2016-06-27 MED ORDER — SENNOSIDES-DOCUSATE SODIUM 8.6-50 MG PO TABS: 1.0000 | ORAL_TABLET | Freq: Every evening | ORAL | 1 refills | Status: DC | PRN

## 2016-06-27 MED ORDER — DEXAMETHASONE SODIUM PHOSPHATE 10 MG/ML IJ SOLN
10.0000 mg | Freq: Once | INTRAMUSCULAR | Status: AC
Start: 2016-06-28 — End: 2016-06-28
  Administered 2016-06-28: 10 mg via INTRAVENOUS
  Filled 2016-06-27: qty 1

## 2016-06-27 MED ORDER — METHOCARBAMOL 500 MG PO TABS
500.0000 mg | ORAL_TABLET | Freq: Four times a day (QID) | ORAL | Status: DC | PRN
  Administered 2016-06-27 – 2016-06-28 (×2): 500 mg via ORAL
  Filled 2016-06-27: qty 1

## 2016-06-27 MED ORDER — MENTHOL 3 MG MT LOZG: 1.0000 | LOZENGE | OROMUCOSAL | Status: DC | PRN

## 2016-06-27 MED ORDER — FENTANYL CITRATE (PF) 100 MCG/2ML IJ SOLN
INTRAMUSCULAR | Status: AC
  Filled 2016-06-27: qty 4

## 2016-06-27 MED ORDER — ONDANSETRON HCL 4 MG PO TABS: 4.0000 mg | ORAL_TABLET | Freq: Four times a day (QID) | ORAL | Status: DC | PRN

## 2016-06-27 MED ORDER — MIDAZOLAM HCL 2 MG/2ML IJ SOLN
2.0000 mg | Freq: Once | INTRAMUSCULAR | Status: DC
  Filled 2016-06-27: qty 2

## 2016-06-27 MED ORDER — OXYCODONE HCL 5 MG PO TABS
5.0000 mg | ORAL_TABLET | ORAL | 0 refills | Status: DC | PRN
Start: 2016-06-27 — End: 2016-10-09

## 2016-06-27 MED ORDER — BUPIVACAINE IN DEXTROSE 0.75-8.25 % IT SOLN
INTRATHECAL | Status: DC | PRN
  Administered 2016-06-27: 15 mg via INTRATHECAL

## 2016-06-27 MED ORDER — METHOCARBAMOL 750 MG PO TABS: 750.0000 mg | ORAL_TABLET | Freq: Two times a day (BID) | ORAL | 0 refills | Status: DC | PRN

## 2016-06-27 MED ORDER — ASPIRIN EC 325 MG PO TBEC
325.0000 mg | DELAYED_RELEASE_TABLET | Freq: Two times a day (BID) | ORAL | Status: DC
  Administered 2016-06-28 – 2016-06-29 (×3): 325 mg via ORAL
  Filled 2016-06-27 (×3): qty 1

## 2016-06-27 SURGICAL SUPPLY — 77 items
ALCOHOL ISOPROPYL (RUBBING) (MISCELLANEOUS) ×3 IMPLANT
APL SKNCLS STERI-STRIP NONHPOA (GAUZE/BANDAGES/DRESSINGS) ×1
BAG DECANTER FOR FLEXI CONT (MISCELLANEOUS) ×5 IMPLANT
BANDAGE ACE 6X5 VEL STRL LF (GAUZE/BANDAGES/DRESSINGS) ×6 IMPLANT
BANDAGE ELASTIC 6 VELCRO ST LF (GAUZE/BANDAGES/DRESSINGS) ×4 IMPLANT
BANDAGE ESMARK 6X9 LF (GAUZE/BANDAGES/DRESSINGS) ×1 IMPLANT
BENZOIN TINCTURE PRP APPL 2/3 (GAUZE/BANDAGES/DRESSINGS) ×3 IMPLANT
BLADE SAW SGTL 13.0X1.19X90.0M (BLADE) ×3 IMPLANT
BNDG CMPR 9X6 STRL LF SNTH (GAUZE/BANDAGES/DRESSINGS) ×1
BNDG CMPR MED 15X6 ELC VLCR LF (GAUZE/BANDAGES/DRESSINGS) ×1
BNDG ELASTIC 6X15 VLCR STRL LF (GAUZE/BANDAGES/DRESSINGS) ×2 IMPLANT
BNDG ESMARK 6X9 LF (GAUZE/BANDAGES/DRESSINGS) ×3
BONE CEMENT PALACOS R-G (Orthopedic Implant) ×6 IMPLANT
BOWL SMART MIX CTS (DISPOSABLE) ×3 IMPLANT
CAP KNEE TOTAL 3 ×3 IMPLANT
CEMENT BONE PALACOS R-G (Orthopedic Implant) ×2 IMPLANT
CLOSURE STERI-STRIP 1/2X4 (GAUZE/BANDAGES/DRESSINGS) ×2
CLSR STERI-STRIP ANTIMIC 1/2X4 (GAUZE/BANDAGES/DRESSINGS) ×4 IMPLANT
COVER SURGICAL LIGHT HANDLE (MISCELLANEOUS) ×3 IMPLANT
CUFF TOURNIQUET SINGLE 34IN LL (TOURNIQUET CUFF) ×3 IMPLANT
CUFF TOURNIQUET SINGLE 44IN (TOURNIQUET CUFF) IMPLANT
DRAPE HALF SHEET 40X57 (DRAPES) ×3 IMPLANT
DRAPE INCISE IOBAN 66X45 STRL (DRAPES) IMPLANT
DRAPE ORTHO SPLIT 77X108 STRL (DRAPES) ×6
DRAPE PROXIMA HALF (DRAPES) ×3 IMPLANT
DRAPE SURG 17X11 SM STRL (DRAPES) ×6 IMPLANT
DRAPE SURG ORHT 6 SPLT 77X108 (DRAPES) ×2 IMPLANT
DRSG AQUACEL AG ADV 3.5X14 (GAUZE/BANDAGES/DRESSINGS) ×3 IMPLANT
DRSG PAD ABDOMINAL 8X10 ST (GAUZE/BANDAGES/DRESSINGS) ×4 IMPLANT
DURAPREP 26ML APPLICATOR (WOUND CARE) ×15 IMPLANT
ELECT CAUTERY BLADE 6.4 (BLADE) ×3 IMPLANT
ELECT REM PT RETURN 9FT ADLT (ELECTROSURGICAL) ×3
ELECTRODE REM PT RTRN 9FT ADLT (ELECTROSURGICAL) ×1 IMPLANT
GLOVE SKINSENSE NS SZ7.5 (GLOVE) ×2
GLOVE SKINSENSE STRL SZ7.5 (GLOVE) ×1 IMPLANT
GLOVE SURG SYN 7.5  E (GLOVE) ×8
GLOVE SURG SYN 7.5 E (GLOVE) ×4 IMPLANT
GLOVE SURG SYN 7.5 PF PI (GLOVE) ×4 IMPLANT
GOWN STRL REIN XL XLG (GOWN DISPOSABLE) ×3 IMPLANT
GOWN STRL REUS W/ TWL LRG LVL3 (GOWN DISPOSABLE) ×1 IMPLANT
GOWN STRL REUS W/TWL LRG LVL3 (GOWN DISPOSABLE) ×3
HANDPIECE INTERPULSE COAX TIP (DISPOSABLE) ×3
HOOD PEEL AWAY FLYTE STAYCOOL (MISCELLANEOUS) ×6 IMPLANT
KIT BASIN OR (CUSTOM PROCEDURE TRAY) ×3 IMPLANT
KIT ROOM TURNOVER OR (KITS) ×3 IMPLANT
MANIFOLD NEPTUNE II (INSTRUMENTS) ×3 IMPLANT
NDL SPNL 18GX3.5 QUINCKE PK (NEEDLE) ×1 IMPLANT
NEEDLE SPNL 18GX3.5 QUINCKE PK (NEEDLE) ×3 IMPLANT
NS IRRIG 1000ML POUR BTL (IV SOLUTION) ×3 IMPLANT
PACK TOTAL JOINT (CUSTOM PROCEDURE TRAY) ×3 IMPLANT
PAD ARMBOARD 7.5X6 YLW CONV (MISCELLANEOUS) ×6 IMPLANT
PAD CAST 4YDX4 CTTN HI CHSV (CAST SUPPLIES) IMPLANT
PADDING CAST COTTON 4X4 STRL (CAST SUPPLIES) ×3
PADDING CAST COTTON 6X4 STRL (CAST SUPPLIES) ×2 IMPLANT
PEN SKIN MARKING BROAD (MISCELLANEOUS) ×5 IMPLANT
SAW OSC TIP CART 19.5X105X1.3 (SAW) ×3 IMPLANT
SEALER BIPOLAR AQUA 6.0 (INSTRUMENTS) ×3 IMPLANT
SET HNDPC FAN SPRY TIP SCT (DISPOSABLE) ×1 IMPLANT
SPONGE GAUZE 4X4 12PLY STER LF (GAUZE/BANDAGES/DRESSINGS) ×2 IMPLANT
STAPLER VISISTAT 35W (STAPLE) IMPLANT
SUCTION FRAZIER HANDLE 10FR (MISCELLANEOUS) ×2
SUCTION TUBE FRAZIER 10FR DISP (MISCELLANEOUS) ×1 IMPLANT
SUT ETHILON 2 0 FS 18 (SUTURE) IMPLANT
SUT MNCRL AB 4-0 PS2 18 (SUTURE) IMPLANT
SUT VIC AB 0 CT1 27 (SUTURE) ×6
SUT VIC AB 0 CT1 27XBRD ANBCTR (SUTURE) ×2 IMPLANT
SUT VIC AB 1 CTX 27 (SUTURE) ×9 IMPLANT
SUT VIC AB 1 CTX 36 (SUTURE) ×3
SUT VIC AB 1 CTX36XBRD ANBCTR (SUTURE) IMPLANT
SUT VIC AB 2-0 CT1 27 (SUTURE) ×9
SUT VIC AB 2-0 CT1 TAPERPNT 27 (SUTURE) ×3 IMPLANT
SYR 20CC LL (SYRINGE) ×3 IMPLANT
SYR 50ML LL SCALE MARK (SYRINGE) ×3 IMPLANT
TOWEL OR 17X24 6PK STRL BLUE (TOWEL DISPOSABLE) ×3 IMPLANT
TOWEL OR 17X26 10 PK STRL BLUE (TOWEL DISPOSABLE) ×3 IMPLANT
TRAY CATH 16FR W/PLASTIC CATH (SET/KITS/TRAYS/PACK) ×3 IMPLANT
WRAP KNEE MAXI GEL POST OP (GAUZE/BANDAGES/DRESSINGS) ×3 IMPLANT

## 2016-06-27 NOTE — Anesthesia Procedure Notes (Signed)
Spinal  Patient location during procedure: OR Start time: 06/27/2016 1:54 PM End time: 06/27/2016 1:59 PM Staffing Anesthesiologist: Roderic Palau Performed: anesthesiologist  Preanesthetic Checklist Completed: patient identified, surgical consent, pre-op evaluation, timeout performed, IV checked, risks and benefits discussed and monitors and equipment checked Spinal Block Patient position: sitting Prep: DuraPrep Patient monitoring: cardiac monitor, continuous pulse ox and blood pressure Approach: midline Location: L3-4 Injection technique: single-shot Needle Needle type: Pencan  Needle gauge: 24 G Needle length: 9 cm Assessment Sensory level: T8 Additional Notes Functioning IV was confirmed and monitors were applied. Sterile prep and drape, including hand hygiene and sterile gloves were used. The patient was positioned and the spine was prepped. The skin was anesthetized with lidocaine.  Free flow of clear CSF was obtained prior to injecting local anesthetic into the CSF.  The spinal needle aspirated freely following injection.  The needle was carefully withdrawn.  The patient tolerated the procedure well.

## 2016-06-27 NOTE — Anesthesia Procedure Notes (Addendum)
Anesthesia Regional Block:  Adductor canal block  Pre-Anesthetic Checklist: ,, timeout performed, Correct Patient, Correct Site, Correct Laterality, Correct Procedure, Correct Position, site marked, Risks and benefits discussed, pre-op evaluation,  At surgeon's request and post-op pain management  Laterality: Right  Prep: Maximum Sterile Barrier Precautions used, chloraprep       Needles:  Injection technique: Single-shot  Needle Type: Echogenic Stimulator Needle     Needle Length: 9cm 9 cm Needle Gauge: 21 and 21 G    Additional Needles:  Procedures: ultrasound guided (picture in chart) Adductor canal block Narrative:  Start time: 06/27/2016 11:25 AM End time: 06/27/2016 11:35 AM Injection made incrementally with aspirations every 5 mL. Anesthesiologist: Roderic Palau  Additional Notes: 2% Lidocaine skin wheel.

## 2016-06-27 NOTE — Discharge Instructions (Signed)

## 2016-06-27 NOTE — Progress Notes (Signed)
Coude cath placement unsuccessful; Dr. Erlinda Hong at bedside to call urologist; B/P 124/64. HR sinus brady in the low 50's; CBG 91; Urologist en route. Will continue to monitor.

## 2016-06-27 NOTE — Consult Note (Signed)
Consult: Difficult Foley, urinary retention Requested by: Dr. Erlinda Hong  History of Present Illness: 70 year old white [name] status post right knee replacement today. Foley could not be placed in the operating room for in the PACU by coude team. Urology consult requested. Patient underwent spinal anesthesia. He's become increasingly uncomfortable with urge to void and suprapubic pain and a distended bladder on exam.  He was seen by Dr. Jeffie Pollock in our office. He has a history of TURP in 2001 and a cystoscopy in 2006 showed some stricturing at the membranous urethra and regrowth within the prostatic urethra. I reviewed the patient's hospital and office charts.   Past Medical History:  Diagnosis Date  . Allergy    hay fever  . Anxiety   . Cataract   . Complication of anesthesia    hypoglycemia, low blood pressure, pt. reports it dropped to zero- post op MORPHINE , then taken ICU due to either the morphine or hypoglycemia . In addition post op from cataract surgery- 07/2015, experienced an episode of hypoglycemia    . Depressive disorder, not elsewhere classified   . Diverticulosis   . Elevated prostate specific antigen (PSA) 2008   Dr Jeffie Pollock  . H/O cardiovascular stress test >10 yrs.   told that its wnl. Told to drink less coffee  . Hx of migraines   . Hypertension   . Hypoglycemia   . IBS (irritable bowel syndrome)   . Macular degeneration (senile) of retina, unspecified    2 different opinions from 2 different Ophth  . Osteoarthrosis, unspecified whether generalized or localized, unspecified site   . Other and unspecified hyperlipidemia   . Prostatitis 2008  . Refusal of blood transfusions as patient is Jehovah's Witness   . 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   Past Surgical History:  Procedure Laterality Date  . CATARACT EXTRACTION, BILATERAL    . COLONOSCOPY  2012   Dr Olevia Perches  . CYSTOSCOPY     for hematuria  . hydrocoelectomy  2003   Dr Jeffie Pollock  . KNEE  ARTHROSCOPY Left 1994  . prostate nodule resection  2003  . TONSILLECTOMY    . TOTAL KNEE ARTHROPLASTY  2009    Home Medications:  Prescriptions Prior to Admission  Medication Sig Dispense Refill Last Dose  . amLODipine (NORVASC) 10 MG tablet TAKE 1 TABLET (5 MG TOTAL) BY MOUTH DAILY. (Patient taking differently: Take 10 mg by mouth daily. TAKE 1 TABLET (5 MG TOTAL) BY MOUTH DAILY.) 90 tablet 2 06/26/2016 at Unknown time  . b complex vitamins tablet Take 1 tablet by mouth every other day.   06/19/16  . doxycycline (VIBRA-TABS) 100 MG tablet Take 1 tablet (100 mg total) by mouth 2 (two) times daily. 20 tablet 0 Past Week at Unknown time  . IODINE, KELP, PO Take 1 tablet by mouth daily.   06/19/16  . Misc Natural Products (NF FORMULAS TESTOSTERONE) CAPS Take 1 tablet by mouth daily.   06/19/16  . naproxen sodium (ANAPROX) 220 MG tablet Take 220 mg by mouth 2 (two) times daily as needed.   06/19/16  . OVER THE COUNTER MEDICATION Take 1 tablet by mouth daily. [place]  Immune System Supplement   06/19/16  . sodium chloride (OCEAN) 0.65 % SOLN nasal spray Place 1 spray into both nostrils 2 (two) times daily as needed for congestion.   06/19/16  . vitamin C (ASCORBIC ACID) 500 MG tablet Take 500 mg by mouth 3 (three) times  daily.   06/19/16  . VITAMIN K PO Take 1 tablet by mouth daily.   06/19/16   Allergies:  Allergies  Allergen Reactions  . Morphine And Related Other (See Comments)    Severe drop in blood pressure  . Citalopram Hydrobromide Other (See Comments)    PATIENT PREFERENCE "Just ineffective"  . Sertraline Hcl Other (See Comments)    REACTION: headache    Family History  Problem Relation Age of Onset  . Lung cancer Father     smoker  . Alcohol abuse Father   . Tuberculosis Father   . Heart disease Father   . Early death Father   . Diabetes Other   . Heart disease Mother   . Stroke Neg Hx    Social History:  reports that he quit smoking about 47 years ago. He has never used  smokeless tobacco. He reports that he drinks about 1.2 - 1.8 oz of alcohol per week . He reports that he does not use drugs.  ROS: A complete review of systems was performed.  All systems are negative except for pertinent findings as noted. ROS   Physical Exam:  Vital signs in last 24 hours: Temp:  [97.3 F (36.3 C)-98.4 F (36.9 C)] 97.3 F (36.3 C) (12/14 1625) Pulse Rate:  [38-61] 52 (12/14 1840) Resp:  [10-21] 12 (12/14 1840) BP: (85-158)/(48-89) 118/65 (12/14 1840) SpO2:  [97 %-100 %] 99 % (12/14 1840) Weight:  [87 kg (191 lb 12.8 oz)] 87 kg (191 lb 12.8 oz) (12/14 1036) General:  Alert and oriented, No acute distress HEENT: Normocephalic, atraumatic Neck: No JVD or lymphadenopathy Cardiovascular: Regular rate and rhythm Lungs: Regular rate and effort Abdomen: distended SP area which turned soft after foley placement and drainage of ~1200 ml clear urine.  Extremities: left scd, right leg wrapped  Neurologic: Grossly intact  Procedure: The penis was prepped in the routine fashion. A 16 French coud catheter was advanced and there was some resistance at the membranous/prostatic urethra. With some pressure the catheter began to work its way up through the prostate normally and into the bladder. Clear urine drained, the balloon was filled and seated at the bladder neck. About 1200 mL drained.  Laboratory Data:  Results for orders placed or performed during the hospital encounter of 06/27/16 (from the past 24 hour(s))  Glucose, capillary     Status: None   Collection Time: 06/27/16 10:38 AM  Result Value Ref Range   Glucose-Capillary 89 65 - 99 mg/dL  Glucose, capillary     Status: None   Collection Time: 06/27/16  1:15 PM  Result Value Ref Range   Glucose-Capillary 79 65 - 99 mg/dL  Glucose, capillary     Status: None   Collection Time: 06/27/16  6:12 PM  Result Value Ref Range   Glucose-Capillary 91 65 - 99 mg/dL   Recent Results (from the past 240 hour(s))  Surgical  pcr screen     Status: None   Collection Time: 06/19/16 11:18 AM  Result Value Ref Range Status   MRSA, PCR NEGATIVE NEGATIVE Final   Staphylococcus aureus NEGATIVE NEGATIVE Final    Comment:        The Xpert SA Assay (FDA approved for NASAL specimens in patients over 32 years of age), is one component of a comprehensive surveillance program.  Test performance has been validated by [facility] for patients greater than or equal to 42 year old. It is not intended to diagnose infection nor to guide  or monitor treatment.    Creatinine: No results for input(s): CREATININE in the last 168 hours.  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   Nastassia Bazaldua 06/27/2016, 7:05 PM

## 2016-06-27 NOTE — Progress Notes (Signed)
Patient C/o increasing bladder discomfort and overall "not feeling well"; Patient's appearance pale and dusky' HR high 30's-low 40's; BP 85/48; Patient placed in trendelenburg; IV fluid rate increased; Dr Erlinda Hong in unit and Dr Conrad [place] called to bedside; Coude nurse at bedside to attempt catheter placement.

## 2016-06-27 NOTE — Anesthesia Preprocedure Evaluation (Addendum)
Anesthesia Evaluation  Patient identified by MRN, date of birth, ID band Patient awake    Reviewed: Allergy & Precautions, H&P , NPO status , Patient's Chart, lab work & pertinent test results  Airway Mallampati: I  TM Distance: >3 FB Neck ROM: Full    Dental no notable dental hx. (+) Teeth Intact, Dental Advisory Given   Pulmonary neg pulmonary ROS, former smoker,    Pulmonary exam normal breath sounds clear to auscultation       Cardiovascular hypertension, Pt. on medications  Rhythm:Regular Rate:Normal     Neuro/Psych Anxiety Depression negative neurological ROS     GI/Hepatic negative GI ROS, Neg liver ROS,   Endo/Other  negative endocrine ROS  Renal/GU negative Renal ROS  negative genitourinary   Musculoskeletal  (+) Arthritis , Osteoarthritis,    Abdominal   Peds  Hematology  (+) JEHOVAH'S WITNESS  Anesthesia Other Findings   Reproductive/Obstetrics negative OB ROS                           Anesthesia Physical Anesthesia Plan  ASA: II  Anesthesia Plan: Spinal   Post-op Pain Management:  Regional for Post-op pain   Induction: Intravenous  Airway Management Planned: Simple Face Mask  Additional Equipment:   Intra-op Plan:   Post-operative Plan:   Informed Consent: I have reviewed the patients History and Physical, chart, labs and discussed the procedure including the risks, benefits and alternatives for the proposed anesthesia with the patient or authorized representative who has indicated his/her understanding and acceptance.   Dental advisory given  Plan Discussed with: CRNA  Anesthesia Plan Comments:        Anesthesia Quick Evaluation

## 2016-06-27 NOTE — Transfer of Care (Signed)
Immediate Anesthesia Transfer of Care Note  Patient: [name]  Procedure(s) Performed: Procedure(s): TOTAL KNEE ARTHROPLASTY (Right)  Patient Location: PACU  Anesthesia Type:Spinal  Level of Consciousness: awake, alert  and oriented  Airway & Oxygen Therapy: Patient Spontanous Breathing and Patient connected to nasal cannula oxygen  Post-op Assessment: Report given to RN and Post -op Vital signs reviewed and stable  Post vital signs: Reviewed and stable  Last Vitals:  Vitals:   06/27/16 1036  BP: (!) 144/73  Pulse: 60  Resp: 20  Temp: 36.9 C    Last Pain:  Vitals:   06/27/16 1036  TempSrc: Oral         Complications: No apparent anesthesia complications

## 2016-06-27 NOTE — Progress Notes (Signed)
Orthopedic Tech Progress Note Patient Details:  Nathan [name] [birth year] [id number]  CPM Right Knee CPM Right Knee: On Right Knee Flexion (Degrees): 90 Right Knee Extension (Degrees): 0 Additional Comments: applied CPM 0-90 as orderd.  Provided Bone foam zero degree as ordered.   Kristopher Oppenheim 06/27/2016, 4:59 PM

## 2016-06-27 NOTE — H&P (Signed)
PREOPERATIVE H&P  Chief Complaint: Right Knee Degenerative Joint Disease  HPI: Nathan [name] is a 70 y.o. male who presents for surgical treatment of Right Knee Degenerative Joint Disease.  He denies any changes in medical history.  Past Medical History:  Diagnosis Date  . Allergy    hay fever  . Anxiety   . Cataract   . Complication of anesthesia    hypoglycemia, low blood pressure, pt. reports it dropped to zero- post op MORPHINE , then taken ICU due to either the morphine or hypoglycemia . In addition post op from cataract surgery- 07/2015, experienced an episode of hypoglycemia    . Depressive disorder, not elsewhere classified   . Diverticulosis   . Elevated prostate specific antigen (PSA) 2008   Dr Jeffie Pollock  . H/O cardiovascular stress test >10 yrs.   told that its wnl. Told to drink less coffee  . Hx of migraines   . Hypertension   . Hypoglycemia   . IBS (irritable bowel syndrome)   . Macular degeneration (senile) of retina, unspecified    2 different opinions from 2 different Ophth  . Osteoarthrosis, unspecified whether generalized or localized, unspecified site   . Other and unspecified hyperlipidemia   . Prostatitis 2008  . Refusal of blood transfusions as patient is Jehovah's Witness   . 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   Past Surgical History:  Procedure Laterality Date  . CATARACT EXTRACTION, BILATERAL    . COLONOSCOPY  2012   Dr Olevia Perches  . CYSTOSCOPY     for hematuria  . hydrocoelectomy  2003   Dr Jeffie Pollock  . KNEE ARTHROSCOPY Left 1994  . prostate nodule resection  2003  . TONSILLECTOMY    . TOTAL KNEE ARTHROPLASTY  2009   Social History   Social History  . Marital status: Married    Spouse name: N/A  . Number of children: N/A  . Years of education: N/A   Social History Main Topics  . Smoking status: Former Smoker    Quit date: 07/15/1968  . Smokeless tobacco: Never Used     Comment: smoked [place], up to  1ppd  . Alcohol use 1.2 - 1.8 oz/week    2 - 3 Shots of liquor per week  . Drug use: No  . Sexual activity: Yes    Birth control/ protection: None   Other Topics Concern  . Not on file   Social History Narrative   Married. Wife's name is [name]. Full time employed, Pensions consultant.   Drinks caffeinated beverages. Take a multivitamin. Wears his seatbelt.   Exercises at least 3 times a week. Is not a strict vegetarian, but only eats meat 1-2 times a week   Wears a hearing aid. Does not wear dentures or use an assistive walking device.   There is a smoke detector in his home.   He feels safe in his relationships.   Family History  Problem Relation Age of Onset  . Lung cancer Father     smoker  . Alcohol abuse Father   . Tuberculosis Father   . Heart disease Father   . Early death Father   . Diabetes Other   . Heart disease Mother   . Stroke Neg Hx    Allergies  Allergen Reactions  . Morphine And Related Other (See Comments)    Severe drop in blood pressure  . Citalopram Hydrobromide Other (See Comments)  PATIENT PREFERENCE "Just ineffective"  . Sertraline Hcl Other (See Comments)    REACTION: headache   Prior to Admission medications   Medication Sig Start Date End Date Taking? Authorizing Provider  amLODipine (NORVASC) 10 MG tablet TAKE 1 TABLET (5 MG TOTAL) BY MOUTH DAILY. Patient taking differently: Take 10 mg by mouth daily. TAKE 1 TABLET (5 MG TOTAL) BY MOUTH DAILY. 01/03/16  Yes Renee A Kuneff, DO  b complex vitamins tablet Take 1 tablet by mouth every other day.   Yes Historical Provider, MD  doxycycline (VIBRA-TABS) 100 MG tablet Take 1 tablet (100 mg total) by mouth 2 (two) times daily. 06/10/16  Yes Renee A Kuneff, DO  IODINE, KELP, PO Take 1 tablet by mouth daily.   Yes Historical Provider, MD  Misc Natural Products (NF FORMULAS TESTOSTERONE) CAPS Take 1 tablet by mouth daily.   Yes Historical Provider, MD  naproxen sodium (ANAPROX) 220 MG tablet  Take 220 mg by mouth 2 (two) times daily as needed.   Yes Historical Provider, MD  OVER THE COUNTER MEDICATION Take 1 tablet by mouth daily. [place]  Immune System Supplement   Yes Historical Provider, MD  sodium chloride (OCEAN) 0.65 % SOLN nasal spray Place 1 spray into both nostrils 2 (two) times daily as needed for congestion.   Yes Historical Provider, MD  vitamin C (ASCORBIC ACID) 500 MG tablet Take 500 mg by mouth 3 (three) times daily.   Yes Historical Provider, MD  VITAMIN K PO Take 1 tablet by mouth daily.   Yes Historical Provider, MD     Positive ROS: All other systems have been reviewed and were otherwise negative with the exception of those mentioned in the HPI and as above.  Physical Exam: General: Alert, no acute distress Cardiovascular: No pedal edema Respiratory: No cyanosis, no use of accessory musculature GI: abdomen soft Skin: No lesions in the area of chief complaint Neurologic: Sensation intact distally Psychiatric: Patient is competent for consent with normal mood and affect Lymphatic: no lymphedema  MUSCULOSKELETAL: exam stable  Assessment: Right Knee Degenerative Joint Disease  Plan: Plan for Procedure(s): TOTAL KNEE ARTHROPLASTY  The risks benefits and alternatives were discussed with the patient including but not limited to the risks of nonoperative treatment, versus surgical intervention including infection, bleeding, nerve injury,  blood clots, cardiopulmonary complications, morbidity, mortality, among others, and they were willing to proceed.   Eduard Roux, MD   06/27/2016 10:04 AM

## 2016-06-27 NOTE — Op Note (Signed)
Total Knee Arthroplasty Procedure Note Nathan [name] [id number] 06/27/2016   Preoperative diagnosis: Right knee osteoarthritis  Postoperative diagnosis:same  Operative procedure: Right total knee arthroplasty. CPT [phone]  Surgeon: N. Eduard Roux, MD  Assistants: Laure Kidney, RNFA  Anesthesia: Spinal, regional  Tourniquet time: less than 90 mins  Implants used: [employer] Journey II BCS Femur: 6 Tibia: 7 Patella: 35 mm, 7.5 Polyethylene: 9 mm  Indication: Nathan [name] is a 70 y.o. year old male with a history of knee pain. Having failed conservative management, the patient elected to proceed with a total knee arthroplasty.  We have reviewed the risk and benefits of the surgery and they elected to proceed after voicing understanding.  Procedure:  After informed consent was obtained and understanding of the risk were voiced including but not limited to bleeding, infection, damage to surrounding structures including nerves and vessels, blood clots, leg length inequality and the failure to achieve desired results, the operative extremity was marked with verbal confirmation of the patient in the holding area.   The patient was then brought to the operating room and transported to the operating room table in the supine position.  A tourniquet was applied to the operative extremity around the upper thigh. The operative limb was then prepped and draped in the usual sterile fashion and preoperative antibiotics were administered.  A time out was performed prior to the start of surgery confirming the correct extremity, preoperative antibiotic administration, as well as team members, implants and instruments available for the case. Correct surgical site was also confirmed with preoperative radiographs. The limb was then elevated for exsanguination and the tourniquet was inflated. A midline incision was made and a standard medial parapatellar approach was performed.  The patella was  prepared and sized to a 35 mm.  A cover was placed on the patella for protection from retractors.  We then turned our attention to the femur. Posterior cruciate ligament was sacrificed. Start site was drilled in the femur and the intramedullary distal femoral cutting guide was placed, set at 5 degrees valgus, taking 9 mm of distal resection. The distal cut was made. Osteophytes were then removed. Next, the proximal tibial cutting guide was placed with appropriate slope, varus/valgus alignment and depth of resection. The proximal tibial cut was made. Gap blocks were then used to assess the extension gap and alignment, and appropriate soft tissue releases were performed. Attention was turned back to the femur, which was sized using the sizing guide to a size 6. Appropriate rotation of the femoral component was determined using epicondylar axis, Whiteside's line, and assessing the flexion gap under ligament tension. The appropriate size 4-in-1 cutting block was placed and cuts were made. Posterior femoral osteophytes and uncapped bone were then removed with the curved osteotome. The tibia was sized for a size 7 component. The femoral box-cutting guide was placed and prepared for a PS femoral component. Trial components were placed, and stability was checked in full extension, mid-flexion, and deep flexion. Proper tibial rotation was determined and marked.  The patella tracked well without a lateral release. Trial components were then removed and tibial preparation performed. A posterior capsular injection comprising of 20 cc of 1.3% exparel and 40 cc of normal saline was performed for postoperative pain control. The bony surfaces were irrigated with a pulse lavage and then dried. Bone cement was vacuum mixed on the back table, and the final components sized above were cemented into place. After cement had finished curing, excess  cement was removed. The stability of the construct was re-evaluated throughout a range  of motion and found to be acceptable. The trial liner was removed, the knee was copiously irrigated, and the knee was re-evaluated for any excess bone debris. The real polyethylene liner, 9 mm thick, was inserted and checked to ensure the locking mechanism had engaged appropriately. The tourniquet was deflated and hemostasis was achieved. The wound was irrigated with dilute betadine in normal saline, and then again with normal saline. A drain was not placed. Capsular closure was performed with a #1 vicryl, subcutaneous fat closed with a 2.0 vicryl suture, then subcutaneous tissue closed with interrupted 2.0 vicryl suture. The skin was then closed with a 3.0 monocryl. A sterile dressing was applied.   The patient was awakened in the operating room and taken to recovery in stable condition. All sponge, needle, and instrument counts were correct at the end of the case.  Position: supine  Complications: none.  Time Out: performed   Drains/Packing: none  Estimated blood loss: minimal  Returned to Recovery Room: in good condition.   Antibiotics: yes   Mechanical VTE (DVT) Prophylaxis: sequential compression devices, TED thigh-high  Chemical VTE (DVT) Prophylaxis: aspirin  Fluid Replacement  Crystalloid: see anesthesia record Blood: none  FFP: none   Specimens Removed: 1 to pathology   Sponge and Instrument Count Correct? yes   PACU: portable radiograph - knee AP and Lateral   Admission: inpatient status  Plan/RTC: Return in 2 weeks for wound check.   Weight Bearing/Load Lower Extremity: full   N. Eduard Roux, MD [place] 3:52 PM

## 2016-06-28 ENCOUNTER — Encounter (HOSPITAL_COMMUNITY): Payer: Self-pay | Admitting: Orthopaedic Surgery

## 2016-06-28 LAB — BASIC METABOLIC PANEL
Anion gap: 5 (ref 5–15)
BUN: 13 mg/dL (ref 6–20)
CHLORIDE: 103 mmol/L (ref 101–111)
CO2: 25 mmol/L (ref 22–32)
Calcium: 8 mg/dL — ABNORMAL LOW (ref 8.9–10.3)
Creatinine, Ser: 0.96 mg/dL (ref 0.61–1.24)
GFR calc Af Amer: >60 mL/min (ref >60)
GFR calc non Af Amer: >60 mL/min (ref >60)
GLUCOSE: 126 mg/dL — AB (ref 65–99)
POTASSIUM: 3.9 mmol/L (ref 3.5–5.1)
Sodium: 133 mmol/L — ABNORMAL LOW (ref 135–145)

## 2016-06-28 LAB — CBC
HEMATOCRIT: 33.7 % — AB (ref 39.0–52.0)
HEMOGLOBIN: 11.6 g/dL — AB (ref 13.0–17.0)
MCH: 30.1 pg (ref 26.0–34.0)
MCHC: 34.4 g/dL (ref 30.0–36.0)
MCV: 87.5 fL (ref 78.0–100.0)
PLATELETS: 239 10*3/uL (ref 150–400)
RBC: 3.85 MIL/uL — ABNORMAL LOW (ref 4.22–5.81)
RDW: 14.7 % (ref 11.5–15.5)
WBC: 10.1 10*3/uL (ref 4.0–10.5)

## 2016-06-28 MED ORDER — MORPHINE SULFATE (PF) 2 MG/ML IV SOLN: 1.0000 mg | INTRAVENOUS | Status: DC | PRN

## 2016-06-28 NOTE — Progress Notes (Signed)
Physical Therapy Treatment Patient Details Name: Nathan [name] MRN: [id number] DOB: Mar 21, 1946 Today's Date: 06/28/2016    History of Present Illness Pt is a 70 y.o. male s/p R total knee arthroplasty. He has a past medical history significant for allergy (hay fever), anxiety, cataract, complication of anesthesia, depressive disorder, diverticulosis, migraines, hypertension, hypoglycemia, irritable bowel syndrome, macular degeneration (senile) of retina, osteoarthritis, hyperlipidemia, prostatitis, and tuberculosis. Surgical history includes L total knee arthroplasty.    PT Comments    Pt is POD # 1 and this is his second session.  He continues to progress his mobility and gait distance, but unfortunately does not have any increased return in his right ankle DF strength.  MD updated and headed into room to check on pt now.  Pt continues to progress well enough to d/c home with wife's assist and HHPT f/u at discharge.    Follow Up Recommendations  Home health PT;Supervision for mobility/OOB     Equipment Recommendations  Rolling walker with 5" wheels    Recommendations for Other Services   NA     Precautions / Restrictions Precautions Precautions: Knee Precaution Booklet Issued: Yes (comment) Precaution Comments: knee exercise handout given to pt.   Restrictions RLE Weight Bearing: Weight bearing as tolerated    Mobility  Bed Mobility               General bed mobility comments: Pt OOB in recliner chair  Transfers Overall transfer level: Needs assistance Equipment used: Rolling walker (2 wheeled) Transfers: Sit to/from Stand Sit to Stand: Supervision         General transfer comment: supervision for safety.   Ambulation/Gait Ambulation/Gait assistance: Min guard Ambulation Distance (Feet): 130 Feet Assistive device: Rolling walker (2 wheeled) Gait Pattern/deviations: Step-through pattern;Decreased weight shift to right;Steppage;Decreased dorsiflexion -  right Gait velocity: decreased Gait velocity interpretation: Below normal speed for age/gender General Gait Details: Continued steppage gait pattern due to lack of DF strenght.  Despite this, pt reports feeling more confident in his leg this PM.         Balance Overall balance assessment: Needs assistance Sitting-balance support: No upper extremity supported Sitting balance-Leahy Scale: Good     Standing balance support: Bilateral upper extremity supported;No upper extremity supported;Single extremity supported Standing balance-Leahy Scale: Good                      Cognition Arousal/Alertness: Awake/alert Behavior During Therapy: WFL for tasks assessed/performed Overall Cognitive Status: Within Functional Limits for tasks assessed                      Exercises Total Joint Exercises Long Arc Quad: AROM;Right;10 reps Knee Flexion: AROM;AAROM;Right;10 reps;Seated Goniometric ROM: 0-105    General Comments General comments (skin integrity, edema, etc.): Pt still with 0/5 DF strength, 5/5 PF strength and intact sensation to LT in bil legs.  MD updated.       Pertinent Vitals/Pain Pain Assessment: Faces Faces Pain Scale: Hurts a little bit Pain Location: right knee Pain Descriptors / Indicators: Aching;Burning;Grimacing Pain Intervention(s): Limited activity within patient's tolerance;Monitored during session;Repositioned;Ice applied           PT Goals (current goals can now be found in the care plan section) Acute Rehab PT Goals Patient Stated Goal: go home Progress towards PT goals: Progressing toward goals    Frequency    7X/week      PT Plan Current plan remains appropriate  End of Session   Activity Tolerance: Patient tolerated treatment well Patient left: in chair;with call bell/phone within reach;with family/visitor present     Time: [id number] PT Time Calculation (min) (ACUTE ONLY): 40 min  Charges:  $Gait Training: 8-22  mins $Therapeutic Exercise: 23-37 mins                      Mansur Patti B. Qasim Diveley, PT, DPT [phone]   06/28/2016, 5:12 PM

## 2016-06-28 NOTE — Care Management Note (Signed)
Case Management Note  Patient Details  Name: Nathan [name] MRN: [id number] Date of Birth: 06/23/1946  Subjective/Objective:  70 yr old gentleman s/p right total knee arthroplasty.                  Action/Plan: Case manager spoke with patient and wife concerning Home health and DME needs. Patient was preoperatively setup with [facility], no changes. CM will order rolling walker and 3in1. Patient will have family support at discharge.    Expected Discharge Date:   06/29/16               Expected Discharge Plan:  [place]  In-House Referral:  NA  Discharge planning Services  CM Consult  Post Acute Care Choice:  Home Health, Durable Medical Equipment Choice offered to:  Patient  DME Arranged:  3-N-1, Walker rolling DME Agency:  [place]:  PT [place]:  [facility] (formerly [facility])  Status of Service:  Completed, signed off  If discussed at [employer], dates discussed:    Additional Comments:  Ninfa Meeker, RN 06/28/2016, 2:58 PM

## 2016-06-28 NOTE — Evaluation (Signed)
Occupational Therapy Evaluation Patient Details Name: Nathan [name] MRN: [id number] DOB: 01/03/1946 Today's Date: 06/28/2016    History of Present Illness Pt is a 69 y.o. male s/p R total knee arthroplasty. He has a past medical history significant for allergy (hay fever), anxiety, cataract, complication of anesthesia, depressive disorder, diverticulosis, migraines, hypertension, hypoglycemia, irritable bowel syndrome, macular degeneration (senile) of retina, osteoarthritis, hyperlipidemia, prostatitis, and tuberculosis. Surgical history includes L total knee arthroplasty.   Clinical Impression   PTA, pt was independent with ADL and functional mobility. Pt currently requires min guard assist for functional mobility and LB ADL. Pt lives with wife who will be able to provide 24 hour assistance post-acute D/C. Pt would benefit from continued OT services to improve independence with ADL and functional mobility to ensure a safe D/C home. No OT follow-up recommended post-acute D/C. Recommend 3-in-1 B[facility] for DME needs. OT will continue to follow acutely.    Follow Up Recommendations  No OT follow up;Supervision/Assistance - 24 hour    Equipment Recommendations  3 in 1 bedside commode    Recommendations for Other Services       Precautions / Restrictions Precautions Precautions: Knee Precaution Comments: reviewed knee precautions Restrictions Weight Bearing Restrictions: Yes RLE Weight Bearing: Weight bearing as tolerated      Mobility Bed Mobility Overal bed mobility: Needs Assistance Bed Mobility: Supine to Sit     Supine to sit: Supervision     General bed mobility comments: Supervision for safety.  Transfers Overall transfer level: Needs assistance Equipment used: Rolling walker (2 wheeled) Transfers: Sit to/from Stand Sit to Stand: Min guard         General transfer comment: Min guard for safety.    Balance Overall balance assessment: Needs  assistance Sitting-balance support: No upper extremity supported;Feet supported Sitting balance-Leahy Scale: Good     Standing balance support: Bilateral upper extremity supported;During functional activity Standing balance-Leahy Scale: Poor                              ADL Overall ADL's : Needs assistance/impaired     Grooming: Set up;Sitting   Upper Body Bathing: Set up;Sitting   Lower Body Bathing: Min guard;Sit to/from stand   Upper Body Dressing : Set up;Sitting   Lower Body Dressing: Min guard;Sit to/from stand   Toilet Transfer: Min guard;Ambulation;RW   Toileting- Water quality scientist and Hygiene: Min guard;Sit to/from stand       Functional mobility during ADLs: Min guard;Rolling walker General ADL Comments: Pt educated on ADL techniques post-operatively and safe use of DME.     Vision Vision Assessment?: No apparent visual deficits   Perception     Praxis      Pertinent Vitals/Pain Pain Assessment: Faces Faces Pain Scale: Hurts little more Pain Location: R knee Pain Descriptors / Indicators: Aching;Grimacing;Operative site guarding Pain Intervention(s): Limited activity within patient's tolerance;Monitored during session;Repositioned;Ice applied     Hand Dominance Right   Extremity/Trunk Assessment Upper Extremity Assessment Upper Extremity Assessment: Overall WFL for tasks assessed   Lower Extremity Assessment Lower Extremity Assessment: RLE deficits/detail RLE Deficits / Details: Decreased strength and ROM as expected post-operatively.       Communication Communication Communication: No difficulties   Cognition Arousal/Alertness: Awake/alert Behavior During Therapy: WFL for tasks assessed/performed Overall Cognitive Status: Within Functional Limits for tasks assessed  General Comments       Exercises       Shoulder Instructions      Home Living Family/patient expects to be discharged to::  Private residence Living Arrangements: Spouse/significant other Available Help at Discharge: Family;Available 24 hours/day Type of Home: House Home Access: Stairs to enter [employer] of Steps: 12-15 Entrance Stairs-Rails: Right Home Layout: Two level;Able to live on main level with bedroom/bathroom Alternate Level Stairs-Number of Steps: flight   Bathroom Shower/Tub: Tub/shower unit Shower/tub characteristics: Architectural technologist: Standard Bathroom Accessibility: Yes How Accessible: Accessible via walker Home Equipment: Cane - single point          Prior Functioning/Environment Level of Independence: Independent        Comments: Works from home mostly        OT Problem List: Decreased strength;Decreased range of motion;Decreased activity tolerance;Impaired balance (sitting and/or standing);Decreased safety awareness;Decreased knowledge of use of DME or AE;Decreased knowledge of precautions;Pain   OT Treatment/Interventions: Self-care/ADL training;Therapeutic exercise;Energy conservation;Therapeutic activities;Balance training;Patient/family education    OT Goals(Current goals can be found in the care plan section) Acute Rehab OT Goals Patient Stated Goal: go home OT Goal Formulation: With patient Time For Goal Achievement: 07/12/16 Potential to Achieve Goals: Good ADL Goals Pt Will Perform Lower Body Bathing: with modified independence;sit to/from stand Pt Will Perform Lower Body Dressing: with modified independence;sit to/from stand Pt Will Transfer to Toilet: with modified independence;ambulating;bedside commode Pt Will Perform Toileting - Clothing Manipulation and hygiene: sit to/from stand;with modified independence Pt Will Perform Tub/Shower Transfer: with supervision;3 in 1;ambulating;rolling walker;Tub transfer  OT Frequency: Min 2X/week   Barriers to D/C:            Co-evaluation              End of Session Equipment Utilized During  Treatment: Gait belt;Rolling walker CPM Right Knee CPM Right Knee: Off  Activity Tolerance: Patient tolerated treatment well Patient left: in chair;with call bell/phone within reach   Time: 1043-1110 OT Time Calculation (min): 27 min Charges:  OT General Charges $OT Visit: 1 Procedure OT Evaluation $OT Eval Moderate Complexity: 1 Procedure OT Treatments $Self Care/Home Management : 8-22 mins  Norman Herrlich, OTR/L [phone] 06/28/2016, 11:59 AM

## 2016-06-28 NOTE — Consult Note (Signed)
[facility] CM Primary Care Navigator  06/28/2016  Nathan [name] 10/17/1945 [id number]  Met with patient and wife Nathan [name]) at the bedside to identify possible discharge needs. Patient reports increased, worsening pain to right knee that limits walking as well as going up and down the stairs which had led to this admission/surgery.  Patient endorses Dr. Howard Pouch with [facility] [place] as the primary care provider.    Patient shared using [facility] in [place] to obtain medications without any problem.   Patient manages medications at home straight out of the bottle containers.   Wife will be providing transportation to his doctors' appointments.  Patient's wife will be his primary caregiver at home as stated.   Discharge plan is home with home health services per PT recommendation.  Patient and wife voiced understanding to call primary care provider's office once he gets home, for a post discharge follow-up appointment within a week or sooner if needs arise. Patient letter provided as a reminder.  Both patient and wife denied any other needs or concerns at this time.  For additional questions please contact:  Edwena Felty A. Bryn Saline, BSN, RN-BC [facility] PRIMARY CARE Navigator Cell: [phone]

## 2016-06-28 NOTE — Evaluation (Signed)
Physical Therapy Evaluation Patient Details Name: Nathan [name] MRN: [id number] DOB: [birth year] Today's Date: 06/28/2016   History of Present Illness  Pt is a 70 y.o. male s/p R total knee arthroplasty. He has a past medical history significant for allergy (hay fever), anxiety, cataract, complication of anesthesia, depressive disorder, diverticulosis, migraines, hypertension, hypoglycemia, irritable bowel syndrome, macular degeneration (senile) of retina, osteoarthritis, hyperlipidemia, prostatitis, and tuberculosis. Surgical history includes L total knee arthroplasty.  Clinical Impression  Pt is POD #1 and is moving well, min guard assist for gait into the hallway.  He does have some significant R foot drop which I have notified both the RN and Dr. Erlinda Hong about.  Pt will likely progress well enough to d/c home with family and HHPT at discharge.   PT to follow acutely for deficits listed below.       Follow Up Recommendations Home health PT;Supervision for mobility/OOB    Equipment Recommendations  Rolling walker with 5" wheels    Recommendations for Other Services   NA    Precautions / Restrictions Precautions Precautions: Knee Precaution Comments: reviewed knee precautions, however, after learning of R foot drop Dr. Erlinda Hong contacted and actually wanted me to put a little bend behind his right knee for now.  Restrictions Weight Bearing Restrictions: Yes RLE Weight Bearing: Weight bearing as tolerated      Mobility  Bed Mobility Overal bed mobility: Needs Assistance Bed Mobility: Supine to Sit     Supine to sit: Supervision     General bed mobility comments: Pt OOB in recliner chair  Transfers Overall transfer level: Needs assistance Equipment used: Rolling walker (2 wheeled) Transfers: Sit to/from Stand Sit to Stand: Supervision         General transfer comment: Supervision for safety, correct hand placement and safe transitions as taught earlier by  OT  Ambulation/Gait Ambulation/Gait assistance: Min guard Ambulation Distance (Feet): 85 Feet Assistive device: Rolling walker (2 wheeled) Gait Pattern/deviations: Step-through pattern;Decreased weight shift to right;Decreased dorsiflexion - right;Steppage Gait velocity: decreased Gait velocity interpretation: Below normal speed for age/gender General Gait Details: Pt needs min guard assist for safety and has a steppage gait pattern to clear right foot due to no DF strength.  No buckling noted at right knee and safe use of RW with minimal cues.          Balance Overall balance assessment: Needs assistance Sitting-balance support: No upper extremity supported;Feet supported Sitting balance-Leahy Scale: Good     Standing balance support: Bilateral upper extremity supported;During functional activity Standing balance-Leahy Scale: Poor                               Pertinent Vitals/Pain Pain Assessment: Faces Faces Pain Scale: Hurts little more Pain Location: R knee Pain Descriptors / Indicators: Aching;Grimacing;Operative site guarding Pain Intervention(s): Limited activity within patient's tolerance;Monitored during session;Repositioned;Ice applied    Home Living Family/patient expects to be discharged to:: Private residence Living Arrangements: Spouse/significant other Available Help at Discharge: Family;Available 24 hours/day Type of Home: House Home Access: Stairs to enter Entrance Stairs-Rails: Right Entrance Stairs-Number of Steps: 12-15 Home Layout: Two level;Able to live on main level with bedroom/bathroom Home Equipment: Kasandra Knudsen - single point      Prior Function Level of Independence: Independent         Comments: Works from home mostly     [place]: Right    Extremity/Trunk Assessment  Upper Extremity Assessment Upper Extremity Assessment: Defer to OT evaluation    Lower Extremity Assessment Lower Extremity  Assessment: RLE deficits/detail RLE Deficits / Details: Pt with significant DF weakness (0/4) that is new post operatively without matching LE decreased sensation. PF strength is at least 3+/5.  Pt does have a hyper extension moment at his knee and was in the bone foam for extension this AM.  MD notified and asked PT to make sure pt was not currently in bone foam and to flex his knee with a pillow a bit to rest the nerves on the posterior aspect of his knee.  knee and hip strength are normal for post op state and are 2+/5 at knee and 3-/5 for hip flexion.  RLE Sensation:  (appears intact to LT as tested through sock and ace wrap)    Cervical / Trunk Assessment Cervical / Trunk Assessment: Normal  Communication   Communication: No difficulties  Cognition Arousal/Alertness: Awake/alert Behavior During Therapy: WFL for tasks assessed/performed Overall Cognitive Status: Within Functional Limits for tasks assessed                         Exercises Total Joint Exercises Ankle Circles/Pumps: AROM;AAROM;Left;Right;20 reps;Other (comment) (with blanket to help assist right foot) Quad Sets: AROM;Left;10 reps Towel Squeeze: AROM;Both;10 reps Heel Slides: AAROM;Right;10 reps;Other (comment) (with blanket to help with increased ROM)   Assessment/Plan    PT Assessment Patient needs continued PT services  PT Problem List Decreased strength;Decreased range of motion;Decreased activity tolerance;Decreased balance;Decreased mobility;Decreased knowledge of use of DME;Decreased knowledge of precautions;Pain          PT Treatment Interventions DME instruction;Gait training;Stair training;Functional mobility training;Therapeutic activities;Therapeutic exercise;Balance training;Patient/family education;Manual techniques;Modalities    PT Goals (Current goals can be found in the Care Plan section)  Acute Rehab PT Goals Patient Stated Goal: go home PT Goal Formulation: With patient Time For Goal  Achievement: 07/05/16 Potential to Achieve Goals: Good    Frequency 7X/week           End of Session   Activity Tolerance: Patient limited by pain;Patient limited by fatigue Patient left: in chair;with call bell/phone within reach Nurse Communication: Mobility status;Other (comment) (R DF weakness that is abnormal, contacted MD)         Time: [id number] PT Time Calculation (min) (ACUTE ONLY): 28 min   Charges:   PT Evaluation $PT Eval Moderate Complexity: 1 Procedure PT Treatments $Gait Training: 8-22 mins        Dariella Gillihan B. Rohin Krejci, PT, DPT [phone]   06/28/2016, 12:39 PM

## 2016-06-28 NOTE — Anesthesia Postprocedure Evaluation (Signed)
Anesthesia Post Note  Patient: Nathan [name]  Procedure(s) Performed: Procedure(s) (LRB): TOTAL KNEE ARTHROPLASTY (Right)  Patient location during evaluation: PACU Anesthesia Type: Spinal Level of consciousness: oriented and awake and alert Pain management: pain level controlled Vital Signs Assessment: post-procedure vital signs reviewed and stable Respiratory status: spontaneous breathing, respiratory function stable and patient connected to nasal cannula oxygen Cardiovascular status: blood pressure returned to baseline and stable Postop Assessment: no headache and no backache Anesthetic complications: no    Last Vitals:  Vitals:   06/27/16 2359 06/28/16 0439  BP: 127/64 114/60  Pulse: (!) 56 (!) 54  Resp: 16 16  Temp: 36.4 C 36.4 C    Last Pain:  Vitals:   06/28/16 0845  TempSrc:   PainSc: [place]

## 2016-06-28 NOTE — Progress Notes (Signed)
Patient has new foot drop since approximately 9 am this morning.  Dressings have been removed and the knee flexed to 35 degrees.  D/c zero degree foam at this time.  Will continue to monitor.

## 2016-06-29 NOTE — Progress Notes (Signed)
Physical Therapy Treatment Patient Details Name: YASIIN JOLLIFF MRN: [id number] DOB: 04/23/1946 Today's Date: 07-25-16    History of Present Illness      PT Comments    Goofd progress made. Able to increase gait distance. Able to go up and down steps with minimal cuing. Patient continues to have foot drop. He would benefit from further home health therapy. He will be fit for an AFO before discharge.   Follow Up Recommendations   HH therapy      Equipment Recommendations       Recommendations for Other Services       Precautions / Restrictions      Mobility  Bed Mobility                  Transfers                    Ambulation/Gait                 Stairs            Wheelchair Mobility    Modified Rankin (Stroke Patients Only)       Balance                                    Cognition                            Exercises      General Comments        Pertinent Vitals/Pain      Home Living                      Prior Function            PT Goals (current goals can now be found in the care plan section)      Frequency           PT Plan      Co-evaluation             End of Session           Time:  -     Charges:                       G Codes:      [place] [facility] 07-25-16, 4:26 PM

## 2016-06-29 NOTE — Progress Notes (Signed)
Patient discharged to home with belongings, IVs removed. Prescriptions and AVS given. Foley education given, Teach back performed, patient stable at time of discharge.

## 2016-06-29 NOTE — Plan of Care (Signed)
Physical Therapy Treatment Patient Details Name: Nathan [name] MRN: [id number] DOB: [birth year] Today's Date: 06/29/2016    History of Present Illness Pt is a 70 y.o. male s/p R total knee arthroplasty. He has a past medical history significant for allergy (hay fever), anxiety, cataract, complication of anesthesia, depressive disorder, diverticulosis, migraines, hypertension, hypoglycemia, irritable bowel syndrome, macular degeneration (senile) of retina, osteoarthritis, hyperlipidemia, prostatitis, and tuberculosis. Surgical history includes L total knee arthroplasty.    PT Comments    Patient is making good progress. He demonstrated a good understanding of the use of the AFO. He was able to complete stairs earlier in the day. He has no need for further skilled acute therapy. He would benefit from fur ter home health PT.   Follow Up Recommendations  Home health PT;Supervision for mobility/OOB     Equipment Recommendations  Rolling walker with 5" wheels    Recommendations for Other Services       Precautions / Restrictions Precautions Precautions: Knee Restrictions Weight Bearing Restrictions: Yes RLE Weight Bearing: Weight bearing as tolerated    Mobility  Bed Mobility                  Transfers Overall transfer level: Needs assistance Equipment used: Rolling walker (2 wheeled) Transfers: Sit to/from Stand;Stand Pivot Transfers Sit to Stand: Supervision Stand pivot transfers: Supervision          Ambulation/Gait Ambulation/Gait assistance: Supervision Ambulation Distance (Feet): 50 Feet Assistive device: Rolling walker (2 wheeled) Gait Pattern/deviations: Step-to pattern Gait velocity: decreased   General Gait Details: Continued steppage gait pattern due to lack of DF strenght.  Patient continues to have limited dorsi flexion.      Stairs     Stair Management: Two rails   General stair comments: Patient used his AFO but had no sneaker. His pain is  under control with gait. He reported a good understanding of don/doffing the AFO.    Wheelchair Mobility    Modified Rankin (Stroke Patients Only)       Balance Overall balance assessment: Modified Independent Sitting-balance support: No upper extremity supported Sitting balance-Leahy Scale: Good     Standing balance support: Bilateral upper extremity supported Standing balance-Leahy Scale: Poor                      Cognition Arousal/Alertness: Awake/alert Behavior During Therapy: WFL for tasks assessed/performed Overall Cognitive Status: Within Functional Limits for tasks assessed                      Exercises      General Comments        Pertinent Vitals/Pain Pain Assessment: 0-10 Pain Score: 5  Pain Location: R knee Pain Descriptors / Indicators: Aching Pain Intervention(s): Monitored during session;Premedicated before session;Repositioned;RN gave pain meds during session;Relaxation;Utilized relaxation techniques    Home Living                      Prior Function            PT Goals (current goals can now be found in the care plan section) Acute Rehab PT Goals PT Goal Formulation: With patient Time For Goal Achievement: 07/05/16 Potential to Achieve Goals: Good    Frequency    7X/week      PT Plan Current plan remains appropriate    Co-evaluation             End of Session Equipment Utilized During  Treatment: Gait belt         Time: 1400-1420 PT Time Calculation (min) (ACUTE ONLY): 20 min  Charges:  $Gait Training: 8-22 mins                    G Codes:      Carney Living PT DPT  06/29/2016, 4:32 PM

## 2016-06-29 NOTE — Progress Notes (Signed)
   Subjective:  Patient reports pain as mild.    Objective:   VITALS:   Vitals:   06/28/16 0439 06/28/16 1500 06/28/16 2110 06/29/16 0600  BP: 114/60 129/61 127/62 134/63  Pulse: (!) 54 70 60 65  Resp: 16 17  17   Temp: 97.6 F (36.4 C) 97.7 F (36.5 C) 98 F (36.7 C) 98.6 F (37 C)  TempSrc: Oral Oral Oral Oral  SpO2: 97% 98% 94% 97%  Weight:      Height:        Neurologically intact Sensation intact distally Intact pulses distally Incision: dressing C/D/I and no drainage No cellulitis present Compartment soft  Able to dorsiflex his toes and weakly dorsiflex his ankle which is improved from profound foot drop.   Lab Results  Component Value Date   WBC 10.1 06/28/2016   HGB 11.6 (L) 06/28/2016   HCT 33.7 (L) 06/28/2016   MCV 87.5 06/28/2016   PLT 239 06/28/2016     Assessment/Plan:  2 Days Post-Op   - Expected postop acute blood loss anemia - will monitor for symptoms - Up with PT/OT - DVT ppx - SCDs, ambulation, aspirin - WBAT operative extremity - Pain control - [employer] to deliver AFO today - Discharge planning - home today  Nathan [name] 06/29/2016, 10:54 AM [phone]

## 2016-06-29 NOTE — Progress Notes (Signed)
Occupational Therapy Treatment Patient Details Name: Nathan [name] MRN: [id number] DOB: 10/16/1945 Today's Date: 06/29/2016    History of present illness Pt is a 70 y.o. male s/p R total knee arthroplasty. He has a past medical history significant for allergy (hay fever), anxiety, cataract, complication of anesthesia, depressive disorder, diverticulosis, migraines, hypertension, hypoglycemia, irritable bowel syndrome, macular degeneration (senile) of retina, osteoarthritis, hyperlipidemia, prostatitis, and tuberculosis. Surgical history includes L total knee arthroplasty.   OT comments  Pt. Making excellent gains with skilled OT.  Able to complete bed mobility mod I, and demonstrate safe tub transfer min guard A.  Reviewed uses of 3n1. No other acute OT needs.  Clear for d/c from OT.  Will alert OTR/L to sign off.   Follow Up Recommendations  No OT follow up;Supervision/Assistance - 24 hour    Equipment Recommendations  3 in 1 bedside commode    Recommendations for Other Services      Precautions / Restrictions Precautions Precautions: Knee Restrictions RLE Weight Bearing: Weight bearing as tolerated       Mobility Bed Mobility Overal bed mobility: Modified Independent             General bed mobility comments: HOB flat, no rails, exits from L side at home. (able to come into long sitting and manage b les oob  Transfers Overall transfer level: Needs assistance Equipment used: Rolling walker (2 wheeled) Transfers: Sit to/from Stand;Stand Pivot Transfers Sit to Stand: Supervision Stand pivot transfers: Supervision            Balance                                   ADL Overall ADL's : Needs assistance/impaired     Grooming: Standing;Supervision/safety Grooming Details (indicate cue type and reason): simulated while in the b.room                 Toilet Transfer: Supervision/safety;RW;Ambulation;BSC;Regular Toilet Toilet Transfer Details  (indicate cue type and reason): educated on how to place 3n1 over the commode Toileting- Clothing Manipulation and Hygiene: Supervision/safety;Sit to/from stand   Tub/ Shower Transfer: Tub transfer;Min guard;Rolling walker;3 in 1;Cueing for sequencing   Functional mobility during ADLs: Supervision/safety;Rolling walker General ADL Comments: pt. able to return demo of tub transfer with R faucet and will sit on 3n1 during bathing.  able to clear tub ledge in/out of tub with no lob or knee buckling with RLE      Vision                     Perception     Praxis      Cognition   Behavior During Therapy: WFL for tasks assessed/performed Overall Cognitive Status: Within Functional Limits for tasks assessed                       Extremity/Trunk Assessment               Exercises     Shoulder Instructions       General Comments      Pertinent Vitals/ Pain       Pain Assessment: 0-10 Pain Score: 6  Pain Location: R knee Pain Descriptors / Indicators: Aching Pain Intervention(s): Monitored during session;Premedicated before session  Home Living                                            Prior Functioning/Environment              Frequency  Min 2X/week        Progress Toward Goals  OT Goals(current goals can now be found in the care plan section)  Progress towards OT goals: Goals met/education completed, patient discharged from OT     Plan Discharge plan remains appropriate    Co-evaluation                 End of Session Equipment Utilized During Treatment: Gait belt;Rolling walker   Activity Tolerance Patient tolerated treatment well   Patient Left in chair;with call bell/phone within reach   Nurse Communication          Time: 0807-0829 OT Time Calculation (min): 22 min  Charges: OT General Charges $OT Visit: 1 Procedure OT Treatments $Self Care/Home Management : 8-22 mins  Morris, Jennifer  Lorraine, COTA/L 06/29/2016, 8:36 AM    

## 2016-06-30 DIAGNOSIS — I10 Essential (primary) hypertension: Secondary | ICD-10-CM | POA: Diagnosis not present

## 2016-06-30 DIAGNOSIS — G43909 Migraine, unspecified, not intractable, without status migrainosus: Secondary | ICD-10-CM | POA: Diagnosis not present

## 2016-06-30 DIAGNOSIS — Z471 Aftercare following joint replacement surgery: Secondary | ICD-10-CM | POA: Diagnosis not present

## 2016-06-30 DIAGNOSIS — M1991 Primary osteoarthritis, unspecified site: Secondary | ICD-10-CM | POA: Diagnosis not present

## 2016-06-30 DIAGNOSIS — E784 Other hyperlipidemia: Secondary | ICD-10-CM | POA: Diagnosis not present

## 2016-06-30 DIAGNOSIS — K5790 Diverticulosis of intestine, part unspecified, without perforation or abscess without bleeding: Secondary | ICD-10-CM | POA: Diagnosis not present

## 2016-06-30 DIAGNOSIS — M47816 Spondylosis without myelopathy or radiculopathy, lumbar region: Secondary | ICD-10-CM | POA: Diagnosis not present

## 2016-06-30 DIAGNOSIS — K589 Irritable bowel syndrome without diarrhea: Secondary | ICD-10-CM | POA: Diagnosis not present

## 2016-06-30 DIAGNOSIS — H3530 Unspecified macular degeneration: Secondary | ICD-10-CM | POA: Diagnosis not present

## 2016-06-30 DIAGNOSIS — M48061 Spinal stenosis, lumbar region without neurogenic claudication: Secondary | ICD-10-CM | POA: Diagnosis not present

## 2016-06-30 DIAGNOSIS — Z96651 Presence of right artificial knee joint: Secondary | ICD-10-CM | POA: Diagnosis not present

## 2016-07-01 NOTE — Discharge Summary (Signed)
Physician Discharge Summary      Patient ID: Nathan [name] MRN: [id number] DOB/AGE: Oct 23, 1945 70 y.o.  Admit date: 06/27/2016 Discharge date: 07/01/2016  Admission Diagnoses:  <principal problem not specified>  Discharge Diagnoses:  Active Problems:   Total knee replacement status   Past Medical History:  Diagnosis Date  . Allergy    hay fever  . Anxiety   . Cataract   . Complication of anesthesia    hypoglycemia, low blood pressure, pt. reports it dropped to zero- post op Nathan [name] , then taken ICU due to either the Nathan [name] or hypoglycemia . In addition post op from cataract surgery- 07/2015, experienced an episode of hypoglycemia    . Depressive disorder, not elsewhere classified   . Diverticulosis   . Elevated prostate specific antigen (PSA) 2008   Dr Jeffie Pollock  . H/O cardiovascular stress test >10 yrs.   told that its wnl. Told to drink less coffee  . Hx of migraines   . Hypertension   . Hypoglycemia   . IBS (irritable bowel syndrome)   . Macular degeneration (senile) of retina, unspecified    2 different opinions from 2 different Ophth  . Osteoarthrosis, unspecified whether generalized or localized, unspecified site   . Other and unspecified hyperlipidemia   . Prostatitis 2008  . Refusal of blood transfusions as patient is Jehovah's Witness   . 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    Surgeries: Procedure(s): TOTAL KNEE ARTHROPLASTY on 06/27/2016   Consultants (if any): Treatment Team:  Irine Seal, MD  Discharged Condition: Improved  Hospital Course: Nathan [name] is an 70 y.o. male who was admitted 06/27/2016 with a diagnosis of <principal problem not specified> and went to the operating room on 06/27/2016 and underwent the above named procedures.    He was given perioperative antibiotics:  Anti-infectives    Start     Dose/Rate Route Frequency Ordered Stop   06/27/16 2200  sulfamethoxazole-trimethoprim (BACTRIM DS,SEPTRA DS)  800-160 MG per tablet 1 tablet  Status:  Discontinued     1 tablet Oral Every 12 hours 06/27/16 2040 06/29/16 1808   06/27/16 2030  ceFAZolin (ANCEF) IVPB 2g/100 mL premix     2 g 200 mL/hr over 30 Minutes Intravenous Every 6 hours 06/27/16 2012 06/28/16 0458   06/27/16 1027  ceFAZolin (ANCEF) IVPB 2g/100 mL premix     2 g 200 mL/hr over 30 Minutes Intravenous On call to O.R. 06/27/16 1027 06/27/16 1407    .  He was given sequential compression devices, early ambulation, and aspirin for DVT prophylaxis.  He benefited maximally from the hospital stay.  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  He did have partial recovery of the deep peroneal nerve after a night of knee flexion.  An AFO was made for him.    Recent vital signs:  Vitals:   06/29/16 0600 06/29/16 1300  BP: 134/63 (!) 131/52  Pulse: 65 69  Resp: 17 16  Temp: 98.6 F (37 C) 98.2 F (36.8 C)    Recent laboratory studies:  Lab Results  Component Value Date   HGB 11.6 (L) 06/28/2016   HGB 14.8 06/19/2016   HGB 15.6 12/07/2015   Lab Results  Component Value Date   WBC 10.1 06/28/2016   PLT 239 06/28/2016   Lab Results  Component Value Date   INR 1.05 06/19/2016   Lab Results  Component Value Date  NA 133 (L) 06/28/2016   K 3.9 06/28/2016   CL 103 06/28/2016   CO2 25 06/28/2016   BUN 13 06/28/2016   CREATININE 0.96 06/28/2016   GLUCOSE 126 (H) 06/28/2016    Discharge Medications:   Allergies as of 06/29/2016      Reactions   Nathan [name] And Related Other (See Comments)   Severe drop in blood pressure   Citalopram Hydrobromide Other (See Comments)   PATIENT PREFERENCE "Just ineffective"   Sertraline Hcl Other (See Comments)   REACTION: headache      Medication List    TAKE these medications   amLODipine 10 MG tablet Commonly known as:  NORVASC TAKE 1 TABLET (5 MG TOTAL) BY MOUTH DAILY. What changed:  how much to take  how to take  this  when to take this  additional instructions   aspirin EC 325 MG tablet Take 1 tablet (325 mg total) by mouth 2 (two) times daily.   b complex vitamins tablet Take 1 tablet by mouth every other day.   doxycycline 100 MG tablet Commonly known as:  VIBRA-TABS Take 1 tablet (100 mg total) by mouth 2 (two) times daily.   IODINE (KELP) PO Take 1 tablet by mouth daily.   methocarbamol 750 MG tablet Commonly known as:  ROBAXIN Take 1 tablet (750 mg total) by mouth 2 (two) times daily as needed for muscle spasms.   naproxen sodium 220 MG tablet Commonly known as:  ANAPROX Take 220 mg by mouth 2 (two) times daily as needed.   NF FORMULAS TESTOSTERONE Caps Take 1 tablet by mouth daily.   ondansetron 4 MG tablet Commonly known as:  ZOFRAN Take 1-2 tablets (4-8 mg total) by mouth every 8 (eight) hours as needed for nausea or vomiting.   OVER THE COUNTER MEDICATION Take 1 tablet by mouth daily. [place]  Immune System Supplement   oxyCODONE 5 MG immediate release tablet Commonly known as:  Oxy IR/ROXICODONE Take 1-3 tablets (5-15 mg total) by mouth every 4 (four) hours as needed.   oxyCODONE 10 mg 12 hr tablet Commonly known as:  OXYCONTIN Take 1 tablet (10 mg total) by mouth every 12 (twelve) hours.   promethazine 25 MG tablet Commonly known as:  PHENERGAN Take 1 tablet (25 mg total) by mouth every 6 (six) hours as needed for nausea.   senna-docusate 8.6-50 MG tablet Commonly known as:  SENOKOT S Take 1 tablet by mouth at bedtime as needed.   sodium chloride 0.65 % Soln nasal spray Commonly known as:  OCEAN Place 1 spray into both nostrils 2 (two) times daily as needed for congestion.   vitamin C 500 MG tablet Commonly known as:  ASCORBIC ACID Take 500 mg by mouth 3 (three) times daily.   VITAMIN K PO Take 1 tablet by mouth daily.       Diagnostic Studies: Dg Knee Right Port  Result Date: 06/27/2016 CLINICAL DATA:  Post total RIGHT knee replacement EXAM:  PORTABLE RIGHT KNEE - 1-2 VIEW COMPARISON:  Portable exam 1720 hours without priors for comparison FINDINGS: Osseous demineralization. Components of a RIGHT knee prosthesis are identified. No acute fracture, dislocation, or bone destruction. No periprosthetic lucency. Scattered soft tissue gas and soft tissue swelling related to preceding surgery. IMPRESSION: RIGHT knee prosthesis without acute complication. Electronically Signed   By: Lavonia Dana M.D.   On: 06/27/2016 17:49    Disposition: 01-Home or Self Care  Discharge Instructions    Call MD / Call 911  Complete by:  As directed    If you experience chest pain or shortness of breath, CALL 911 and be transported to the hospital emergency room.  If you develope a fever above 101.5 F, pus (white drainage) or increased drainage or redness at the wound, or calf pain, call your surgeon's office.   Constipation Prevention    Complete by:  As directed    Drink plenty of fluids.  Prune juice may be helpful.  You may use a stool softener, such as Colace (over the counter) 100 mg twice a day.  Use MiraLax (over the counter) for constipation as needed.   Driving restrictions    Complete by:  As directed    No driving while taking narcotic pain meds.   Increase activity slowly as tolerated    Complete by:  As directed       Follow-up Information    Eduard Roux, MD Follow up in 2 week(s).   Specialty:  Orthopedic Surgery Why:  For suture removal, For wound re-check Contact information: [place] [address] [phone]        Festus Aloe, MD Follow up on 06/27/2016.   Specialty:  Urology Contact information: [place] [address] [phone]        [facility] Follow up.   Specialty:  [place] Why:  Someone from [facility] will contact you to arrange start date and time for therapy.  Contact information: [address] [place]  [address] [phone]            Signed: Eduard Roux 07/01/2016, 8:53 AM

## 2016-07-02 DIAGNOSIS — M1991 Primary osteoarthritis, unspecified site: Secondary | ICD-10-CM | POA: Diagnosis not present

## 2016-07-02 DIAGNOSIS — Z96651 Presence of right artificial knee joint: Secondary | ICD-10-CM | POA: Diagnosis not present

## 2016-07-02 DIAGNOSIS — M48061 Spinal stenosis, lumbar region without neurogenic claudication: Secondary | ICD-10-CM | POA: Diagnosis not present

## 2016-07-02 DIAGNOSIS — Z471 Aftercare following joint replacement surgery: Secondary | ICD-10-CM | POA: Diagnosis not present

## 2016-07-02 DIAGNOSIS — H3530 Unspecified macular degeneration: Secondary | ICD-10-CM | POA: Diagnosis not present

## 2016-07-02 DIAGNOSIS — G43909 Migraine, unspecified, not intractable, without status migrainosus: Secondary | ICD-10-CM | POA: Diagnosis not present

## 2016-07-02 DIAGNOSIS — K5790 Diverticulosis of intestine, part unspecified, without perforation or abscess without bleeding: Secondary | ICD-10-CM | POA: Diagnosis not present

## 2016-07-02 DIAGNOSIS — M47816 Spondylosis without myelopathy or radiculopathy, lumbar region: Secondary | ICD-10-CM | POA: Diagnosis not present

## 2016-07-02 DIAGNOSIS — R338 Other retention of urine: Secondary | ICD-10-CM | POA: Diagnosis not present

## 2016-07-02 DIAGNOSIS — E784 Other hyperlipidemia: Secondary | ICD-10-CM | POA: Diagnosis not present

## 2016-07-02 DIAGNOSIS — I10 Essential (primary) hypertension: Secondary | ICD-10-CM | POA: Diagnosis not present

## 2016-07-02 DIAGNOSIS — K589 Irritable bowel syndrome without diarrhea: Secondary | ICD-10-CM | POA: Diagnosis not present

## 2016-07-04 DIAGNOSIS — M1991 Primary osteoarthritis, unspecified site: Secondary | ICD-10-CM | POA: Diagnosis not present

## 2016-07-04 DIAGNOSIS — Z471 Aftercare following joint replacement surgery: Secondary | ICD-10-CM | POA: Diagnosis not present

## 2016-07-04 DIAGNOSIS — H3530 Unspecified macular degeneration: Secondary | ICD-10-CM | POA: Diagnosis not present

## 2016-07-04 DIAGNOSIS — M47816 Spondylosis without myelopathy or radiculopathy, lumbar region: Secondary | ICD-10-CM | POA: Diagnosis not present

## 2016-07-04 DIAGNOSIS — M48061 Spinal stenosis, lumbar region without neurogenic claudication: Secondary | ICD-10-CM | POA: Diagnosis not present

## 2016-07-04 DIAGNOSIS — E784 Other hyperlipidemia: Secondary | ICD-10-CM | POA: Diagnosis not present

## 2016-07-04 DIAGNOSIS — G43909 Migraine, unspecified, not intractable, without status migrainosus: Secondary | ICD-10-CM | POA: Diagnosis not present

## 2016-07-04 DIAGNOSIS — I10 Essential (primary) hypertension: Secondary | ICD-10-CM | POA: Diagnosis not present

## 2016-07-04 DIAGNOSIS — K589 Irritable bowel syndrome without diarrhea: Secondary | ICD-10-CM | POA: Diagnosis not present

## 2016-07-04 DIAGNOSIS — K5790 Diverticulosis of intestine, part unspecified, without perforation or abscess without bleeding: Secondary | ICD-10-CM | POA: Diagnosis not present

## 2016-07-04 DIAGNOSIS — Z96651 Presence of right artificial knee joint: Secondary | ICD-10-CM | POA: Diagnosis not present

## 2016-07-09 DIAGNOSIS — M47816 Spondylosis without myelopathy or radiculopathy, lumbar region: Secondary | ICD-10-CM | POA: Diagnosis not present

## 2016-07-09 DIAGNOSIS — G43909 Migraine, unspecified, not intractable, without status migrainosus: Secondary | ICD-10-CM | POA: Diagnosis not present

## 2016-07-09 DIAGNOSIS — E784 Other hyperlipidemia: Secondary | ICD-10-CM | POA: Diagnosis not present

## 2016-07-09 DIAGNOSIS — Z96651 Presence of right artificial knee joint: Secondary | ICD-10-CM | POA: Diagnosis not present

## 2016-07-09 DIAGNOSIS — M48061 Spinal stenosis, lumbar region without neurogenic claudication: Secondary | ICD-10-CM | POA: Diagnosis not present

## 2016-07-09 DIAGNOSIS — K5790 Diverticulosis of intestine, part unspecified, without perforation or abscess without bleeding: Secondary | ICD-10-CM | POA: Diagnosis not present

## 2016-07-09 DIAGNOSIS — H3530 Unspecified macular degeneration: Secondary | ICD-10-CM | POA: Diagnosis not present

## 2016-07-09 DIAGNOSIS — K589 Irritable bowel syndrome without diarrhea: Secondary | ICD-10-CM | POA: Diagnosis not present

## 2016-07-09 DIAGNOSIS — Z471 Aftercare following joint replacement surgery: Secondary | ICD-10-CM | POA: Diagnosis not present

## 2016-07-09 DIAGNOSIS — M1991 Primary osteoarthritis, unspecified site: Secondary | ICD-10-CM | POA: Diagnosis not present

## 2016-07-09 DIAGNOSIS — I10 Essential (primary) hypertension: Secondary | ICD-10-CM | POA: Diagnosis not present

## 2016-07-10 DIAGNOSIS — K589 Irritable bowel syndrome without diarrhea: Secondary | ICD-10-CM | POA: Diagnosis not present

## 2016-07-10 DIAGNOSIS — E784 Other hyperlipidemia: Secondary | ICD-10-CM | POA: Diagnosis not present

## 2016-07-10 DIAGNOSIS — K5790 Diverticulosis of intestine, part unspecified, without perforation or abscess without bleeding: Secondary | ICD-10-CM | POA: Diagnosis not present

## 2016-07-10 DIAGNOSIS — M47816 Spondylosis without myelopathy or radiculopathy, lumbar region: Secondary | ICD-10-CM | POA: Diagnosis not present

## 2016-07-10 DIAGNOSIS — Z471 Aftercare following joint replacement surgery: Secondary | ICD-10-CM | POA: Diagnosis not present

## 2016-07-10 DIAGNOSIS — Z96651 Presence of right artificial knee joint: Secondary | ICD-10-CM | POA: Diagnosis not present

## 2016-07-10 DIAGNOSIS — M1991 Primary osteoarthritis, unspecified site: Secondary | ICD-10-CM | POA: Diagnosis not present

## 2016-07-10 DIAGNOSIS — H3530 Unspecified macular degeneration: Secondary | ICD-10-CM | POA: Diagnosis not present

## 2016-07-10 DIAGNOSIS — I10 Essential (primary) hypertension: Secondary | ICD-10-CM | POA: Diagnosis not present

## 2016-07-10 DIAGNOSIS — G43909 Migraine, unspecified, not intractable, without status migrainosus: Secondary | ICD-10-CM | POA: Diagnosis not present

## 2016-07-10 DIAGNOSIS — M48061 Spinal stenosis, lumbar region without neurogenic claudication: Secondary | ICD-10-CM | POA: Diagnosis not present

## 2016-07-11 ENCOUNTER — Ambulatory Visit (INDEPENDENT_AMBULATORY_CARE_PROVIDER_SITE_OTHER): Payer: Medicare Other | Admitting: Orthopaedic Surgery

## 2016-07-11 ENCOUNTER — Encounter (INDEPENDENT_AMBULATORY_CARE_PROVIDER_SITE_OTHER): Payer: Self-pay | Admitting: Orthopaedic Surgery

## 2016-07-11 DIAGNOSIS — M1711 Unilateral primary osteoarthritis, right knee: Secondary | ICD-10-CM

## 2016-07-11 MED ORDER — TRAZODONE HCL 50 MG PO TABS: 50.0000 mg | ORAL_TABLET | Freq: Every evening | ORAL | 0 refills | Status: DC | PRN

## 2016-07-11 MED ORDER — TRAMADOL HCL 50 MG PO TABS: 50.0000 mg | ORAL_TABLET | Freq: Four times a day (QID) | ORAL | 0 refills | Status: DC | PRN

## 2016-07-11 NOTE — Progress Notes (Signed)
The patient is 2 weeks status post right total knee replacement. He is overall doing well. He has more pain at night. He is doing home health physical therapy has 3 more sessions. The incision is clean dry and intact with expected postoperative swelling. He has no calf tenderness. He did stop taking the aspirin because he was on meloxicam and ibuprofen. He is not taking narcotic medicines because of severe constipation. He requests something milder. At this point want him to wear TED hose his tramadol and trazodone were both prescribed. I'll see him back in 4 weeks with 2 view x-rays of the right knee.

## 2016-07-12 DIAGNOSIS — K5790 Diverticulosis of intestine, part unspecified, without perforation or abscess without bleeding: Secondary | ICD-10-CM | POA: Diagnosis not present

## 2016-07-12 DIAGNOSIS — H3530 Unspecified macular degeneration: Secondary | ICD-10-CM | POA: Diagnosis not present

## 2016-07-12 DIAGNOSIS — M1991 Primary osteoarthritis, unspecified site: Secondary | ICD-10-CM | POA: Diagnosis not present

## 2016-07-12 DIAGNOSIS — Z96651 Presence of right artificial knee joint: Secondary | ICD-10-CM | POA: Diagnosis not present

## 2016-07-12 DIAGNOSIS — Z471 Aftercare following joint replacement surgery: Secondary | ICD-10-CM | POA: Diagnosis not present

## 2016-07-12 DIAGNOSIS — I10 Essential (primary) hypertension: Secondary | ICD-10-CM | POA: Diagnosis not present

## 2016-07-12 DIAGNOSIS — E784 Other hyperlipidemia: Secondary | ICD-10-CM | POA: Diagnosis not present

## 2016-07-12 DIAGNOSIS — M47816 Spondylosis without myelopathy or radiculopathy, lumbar region: Secondary | ICD-10-CM | POA: Diagnosis not present

## 2016-07-12 DIAGNOSIS — G43909 Migraine, unspecified, not intractable, without status migrainosus: Secondary | ICD-10-CM | POA: Diagnosis not present

## 2016-07-12 DIAGNOSIS — M48061 Spinal stenosis, lumbar region without neurogenic claudication: Secondary | ICD-10-CM | POA: Diagnosis not present

## 2016-07-12 DIAGNOSIS — K589 Irritable bowel syndrome without diarrhea: Secondary | ICD-10-CM | POA: Diagnosis not present

## 2016-07-15 DIAGNOSIS — N281 Cyst of kidney, acquired: Secondary | ICD-10-CM

## 2016-07-15 DIAGNOSIS — E278 Other specified disorders of adrenal gland: Secondary | ICD-10-CM

## 2016-07-15 DIAGNOSIS — R319 Hematuria, unspecified: Secondary | ICD-10-CM

## 2016-07-15 HISTORY — DX: Hematuria, unspecified: R31.9

## 2016-07-15 HISTORY — DX: Other specified disorders of adrenal gland: E27.8

## 2016-07-15 HISTORY — DX: Cyst of kidney, acquired: N28.1

## 2016-07-16 DIAGNOSIS — K589 Irritable bowel syndrome without diarrhea: Secondary | ICD-10-CM | POA: Diagnosis not present

## 2016-07-16 DIAGNOSIS — Z471 Aftercare following joint replacement surgery: Secondary | ICD-10-CM | POA: Diagnosis not present

## 2016-07-16 DIAGNOSIS — H3530 Unspecified macular degeneration: Secondary | ICD-10-CM | POA: Diagnosis not present

## 2016-07-16 DIAGNOSIS — E784 Other hyperlipidemia: Secondary | ICD-10-CM | POA: Diagnosis not present

## 2016-07-16 DIAGNOSIS — M48061 Spinal stenosis, lumbar region without neurogenic claudication: Secondary | ICD-10-CM | POA: Diagnosis not present

## 2016-07-16 DIAGNOSIS — Z96651 Presence of right artificial knee joint: Secondary | ICD-10-CM | POA: Diagnosis not present

## 2016-07-16 DIAGNOSIS — G43909 Migraine, unspecified, not intractable, without status migrainosus: Secondary | ICD-10-CM | POA: Diagnosis not present

## 2016-07-16 DIAGNOSIS — M1991 Primary osteoarthritis, unspecified site: Secondary | ICD-10-CM | POA: Diagnosis not present

## 2016-07-16 DIAGNOSIS — M47816 Spondylosis without myelopathy or radiculopathy, lumbar region: Secondary | ICD-10-CM | POA: Diagnosis not present

## 2016-07-16 DIAGNOSIS — I10 Essential (primary) hypertension: Secondary | ICD-10-CM | POA: Diagnosis not present

## 2016-07-16 DIAGNOSIS — K5790 Diverticulosis of intestine, part unspecified, without perforation or abscess without bleeding: Secondary | ICD-10-CM | POA: Diagnosis not present

## 2016-07-17 DIAGNOSIS — Z471 Aftercare following joint replacement surgery: Secondary | ICD-10-CM | POA: Diagnosis not present

## 2016-07-17 DIAGNOSIS — E784 Other hyperlipidemia: Secondary | ICD-10-CM | POA: Diagnosis not present

## 2016-07-17 DIAGNOSIS — H3530 Unspecified macular degeneration: Secondary | ICD-10-CM | POA: Diagnosis not present

## 2016-07-17 DIAGNOSIS — G43909 Migraine, unspecified, not intractable, without status migrainosus: Secondary | ICD-10-CM | POA: Diagnosis not present

## 2016-07-17 DIAGNOSIS — I10 Essential (primary) hypertension: Secondary | ICD-10-CM | POA: Diagnosis not present

## 2016-07-17 DIAGNOSIS — K5790 Diverticulosis of intestine, part unspecified, without perforation or abscess without bleeding: Secondary | ICD-10-CM | POA: Diagnosis not present

## 2016-07-17 DIAGNOSIS — K589 Irritable bowel syndrome without diarrhea: Secondary | ICD-10-CM | POA: Diagnosis not present

## 2016-07-17 DIAGNOSIS — M47816 Spondylosis without myelopathy or radiculopathy, lumbar region: Secondary | ICD-10-CM | POA: Diagnosis not present

## 2016-07-17 DIAGNOSIS — M48061 Spinal stenosis, lumbar region without neurogenic claudication: Secondary | ICD-10-CM | POA: Diagnosis not present

## 2016-07-17 DIAGNOSIS — Z96651 Presence of right artificial knee joint: Secondary | ICD-10-CM | POA: Diagnosis not present

## 2016-07-17 DIAGNOSIS — M1991 Primary osteoarthritis, unspecified site: Secondary | ICD-10-CM | POA: Diagnosis not present

## 2016-07-19 DIAGNOSIS — K589 Irritable bowel syndrome without diarrhea: Secondary | ICD-10-CM | POA: Diagnosis not present

## 2016-07-19 DIAGNOSIS — M48061 Spinal stenosis, lumbar region without neurogenic claudication: Secondary | ICD-10-CM | POA: Diagnosis not present

## 2016-07-19 DIAGNOSIS — H3530 Unspecified macular degeneration: Secondary | ICD-10-CM | POA: Diagnosis not present

## 2016-07-19 DIAGNOSIS — M47816 Spondylosis without myelopathy or radiculopathy, lumbar region: Secondary | ICD-10-CM | POA: Diagnosis not present

## 2016-07-19 DIAGNOSIS — K5790 Diverticulosis of intestine, part unspecified, without perforation or abscess without bleeding: Secondary | ICD-10-CM | POA: Diagnosis not present

## 2016-07-19 DIAGNOSIS — I10 Essential (primary) hypertension: Secondary | ICD-10-CM | POA: Diagnosis not present

## 2016-07-19 DIAGNOSIS — G43909 Migraine, unspecified, not intractable, without status migrainosus: Secondary | ICD-10-CM | POA: Diagnosis not present

## 2016-07-19 DIAGNOSIS — E784 Other hyperlipidemia: Secondary | ICD-10-CM | POA: Diagnosis not present

## 2016-07-19 DIAGNOSIS — Z96651 Presence of right artificial knee joint: Secondary | ICD-10-CM | POA: Diagnosis not present

## 2016-07-19 DIAGNOSIS — Z471 Aftercare following joint replacement surgery: Secondary | ICD-10-CM | POA: Diagnosis not present

## 2016-07-19 DIAGNOSIS — M1991 Primary osteoarthritis, unspecified site: Secondary | ICD-10-CM | POA: Diagnosis not present

## 2016-08-08 ENCOUNTER — Ambulatory Visit (INDEPENDENT_AMBULATORY_CARE_PROVIDER_SITE_OTHER): Payer: Medicare Other

## 2016-08-08 ENCOUNTER — Ambulatory Visit (INDEPENDENT_AMBULATORY_CARE_PROVIDER_SITE_OTHER): Payer: Medicare Other | Admitting: Orthopaedic Surgery

## 2016-08-08 ENCOUNTER — Encounter (INDEPENDENT_AMBULATORY_CARE_PROVIDER_SITE_OTHER): Payer: Self-pay | Admitting: Orthopaedic Surgery

## 2016-08-08 DIAGNOSIS — M1711 Unilateral primary osteoarthritis, right knee: Secondary | ICD-10-CM

## 2016-08-08 DIAGNOSIS — M25551 Pain in right hip: Secondary | ICD-10-CM

## 2016-08-08 NOTE — Progress Notes (Signed)
Patient is 6 weeks status post right total knee replacement. He is doing well. 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 He is happy with his progress. He's got excellent range of motion of his right knee. The surgical scar is well-healed. X-ray show stable alignment of the total knee. Patient is progressing appropriately. I'll like to see him back in 6 weeks for another recheck. No x-rays needed.

## 2016-09-19 ENCOUNTER — Encounter (INDEPENDENT_AMBULATORY_CARE_PROVIDER_SITE_OTHER): Payer: Self-pay | Admitting: Orthopaedic Surgery

## 2016-09-19 ENCOUNTER — Ambulatory Visit (INDEPENDENT_AMBULATORY_CARE_PROVIDER_SITE_OTHER): Payer: Medicare Other | Admitting: Orthopaedic Surgery

## 2016-09-19 DIAGNOSIS — M1711 Unilateral primary osteoarthritis, right knee: Secondary | ICD-10-CM

## 2016-09-19 NOTE — Progress Notes (Signed)
She is almost 3 months status post right total knee replacement. He is doing well. He complains of some restless leg at night. His range of motion is excellent. He is passed physical therapy. He is very happy with his progress. His pain is greatly improved. His range of motion is 0-120. Surgical scar is well-healed. Mild swelling. I reminded him of his antibiotic prophylaxis. I will see him back in 9 months two-view x-rays of the right knee.

## 2016-10-09 ENCOUNTER — Ambulatory Visit: Payer: Self-pay | Admitting: Physician Assistant

## 2016-10-09 ENCOUNTER — Ambulatory Visit (INDEPENDENT_AMBULATORY_CARE_PROVIDER_SITE_OTHER): Payer: Medicare Other | Admitting: Physician Assistant

## 2016-10-09 ENCOUNTER — Encounter: Payer: Self-pay | Admitting: Physician Assistant

## 2016-10-09 VITALS — BP 140/90 | HR 64 | Temp 98.7°F | Ht 70.5 in | Wt 198.5 lb

## 2016-10-09 DIAGNOSIS — J069 Acute upper respiratory infection, unspecified: Secondary | ICD-10-CM

## 2016-10-09 MED ORDER — BENZONATATE 200 MG PO CAPS: 200.0000 mg | ORAL_CAPSULE | Freq: Two times a day (BID) | ORAL | 0 refills | Status: DC | PRN

## 2016-10-09 MED ORDER — AMOXICILLIN 875 MG PO TABS: 875.0000 mg | ORAL_TABLET | Freq: Two times a day (BID) | ORAL | 0 refills | Status: DC

## 2016-10-09 NOTE — Progress Notes (Addendum)
Nathan [name] is a 71 y.o. male here for cough, nasal congestion and sore throat.   History of Present Illness:   Chief Complaint  Patient presents with  . Cough    x 3 days, non-productive  . Sore Throat  . Nasal Congestion    HPI   Patient reports that he has had a cough since Monday that has been nonproductive. He also endorses sore throat and fatigue. He denies fever, chills, poor appetite, shortness of breath. He does not have any known sick contacts. Did not receive the flu shot this year. Not taking anything for his symptoms. Was on antibiotics back in the fall for sinusitis. No recent travel. Not bringing up any mucous. No SOB. Appetite is good. He states that a few years ago he was in [place] and got very sick and "almost died" and since that time he is more susceptible to getting sick.  PMHx, SurgHx, SocialHx, Medications, and Allergies were reviewed in the Visit Navigator and updated as appropriate.  Current Medications:   Current Outpatient Prescriptions:  .  amLODipine (NORVASC) 10 MG tablet, TAKE 1 TABLET (5 MG TOTAL) BY MOUTH DAILY. (Patient taking differently: Take 10 mg by mouth daily. TAKE 1 TABLET (5 MG TOTAL) BY MOUTH DAILY.), Disp: 90 tablet, Rfl: 2 .  b complex vitamins tablet, Take 1 tablet by mouth every other day., Disp: , Rfl:  .  IODINE, KELP, PO, Take 1 tablet by mouth daily., Disp: , Rfl:  .  Misc Natural Products (NF FORMULAS TESTOSTERONE) CAPS, Take 1 tablet by mouth daily., Disp: , Rfl:  .  naproxen sodium (ANAPROX) 220 MG tablet, Take 220 mg by mouth 2 (two) times daily as needed., Disp: , Rfl:  .  OVER THE COUNTER MEDICATION, Take 1 tablet by mouth daily. [place]  Immune System Supplement, Disp: , Rfl:  .  sodium chloride (OCEAN) 0.65 % SOLN nasal spray, Place 1 spray into both nostrils 2 (two) times daily as needed for congestion., Disp: , Rfl:  .  amoxicillin (AMOXIL) 875 MG tablet, Take 1 tablet (875 mg total) by mouth 2 (two) times daily., Disp: 20  tablet, Rfl: 0 .  benzonatate (TESSALON) 200 MG capsule, Take 1 capsule (200 mg total) by mouth 2 (two) times daily as needed for cough., Disp: 20 capsule, Rfl: 0 .  vitamin C (ASCORBIC ACID) 500 MG tablet, Take 500 mg by mouth 3 (three) times daily., Disp: , Rfl:  .  VITAMIN K PO, Take 1 tablet by mouth daily., Disp: , Rfl:    Review of Systems:   Review of Systems  Constitutional: Positive for malaise/fatigue.  HENT: Positive for congestion and sore throat.   Eyes: Negative.   Respiratory: Positive for cough.   Cardiovascular: Negative.   Gastrointestinal: Negative.   Genitourinary: Negative.   Musculoskeletal: Negative.   Skin: Negative.   Neurological: Negative.   Endo/Heme/Allergies: Negative.     Vitals:   Vitals:   10/09/16 1006  BP: 140/90  Pulse: 64  Temp: 98.7 F (37.1 C)  TempSrc: Oral  SpO2: 97%  Weight: 198 lb 8 oz (90 kg)  Height: 5' 10.5" (1.791 m)     Body mass index is 28.08 kg/m.  Physical Exam:   Physical Exam  Constitutional: He appears well-developed. He is cooperative.  Non-toxic appearance. He does not have a sickly appearance. He does not appear ill. No distress.  HENT:  Head: Normocephalic and atraumatic.  Right Ear: Tympanic membrane, external ear and ear  canal normal. Tympanic membrane is not erythematous, not retracted and not bulging.  Left Ear: Tympanic membrane, external ear and ear canal normal. Tympanic membrane is not erythematous, not retracted and not bulging.  Nose: Nose normal. Right sinus exhibits no maxillary sinus tenderness and no frontal sinus tenderness. Left sinus exhibits no maxillary sinus tenderness and no frontal sinus tenderness.  Mouth/Throat: Mucous membranes are normal. Posterior oropharyngeal erythema present. No oropharyngeal exudate or posterior oropharyngeal edema.  Tonsils absent  Eyes: Conjunctivae and lids are normal.  Neck: Trachea normal.  Cardiovascular: Normal rate, regular rhythm, S1 normal, S2 normal  and normal heart sounds.   Pulmonary/Chest: Effort normal and breath sounds normal. He has no decreased breath sounds. He has no wheezes. He has no rhonchi. He has no rales.  Lymphadenopathy:    He has cervical adenopathy.  Neurological: He is alert.  Skin: Skin is warm, dry and intact.  Psychiatric: He has a normal mood and affect. His speech is normal and behavior is normal.  Nursing note and vitals reviewed.     Assessment and Plan:    Nathan [name] was seen today for cough, sore throat and nasal congestion.  Diagnoses and all orders for this visit:  Upper respiratory tract infection, unspecified type  Other orders -     benzonatate (TESSALON) 200 MG capsule; Take 1 capsule (200 mg total) by mouth 2 (two) times daily as needed for cough. -     amoxicillin (AMOXIL) 875 MG tablet; Take 1 tablet (875 mg total) by mouth 2 (two) times daily.   Patient has an important event this weekend. I advised him that I think that this is viral however I will give him a safety net Rx of amoxicillin that he may take if his symptoms persist. I discussed with patient that if he develops any worsening symptoms such as fever or shortness of breath he should be re-evaluated. Tessalon as needed for cough. Continue hand hygiene and hydration. Patient is agreeable to plan. I also recommended he take ibuprofen to help with his throat irritation.  . Reviewed expectations re: course of current medical issues. . Discussed self-management of symptoms. . Outlined signs and symptoms indicating need for more acute intervention. . Patient verbalized understanding and all questions were answered. . See orders for this visit as documented in the electronic medical record. . Patient received an After-Visit Summary.   Inda Coke, PA-C

## 2016-10-09 NOTE — Patient Instructions (Signed)
It was great meeting you!  I have sent in the antibiotic and cough medication for you to take.  If your symptoms change or worsen, please seek medical attention, especially if you develop fever or shortness of breath.   Upper Respiratory Infection, Adult Most upper respiratory infections (URIs) are a viral infection of the air passages leading to the lungs. A URI affects the nose, throat, and upper air passages. The most common type of URI is nasopharyngitis and is typically referred to as "the common cold." URIs run their course and usually go away on their own. Most of the time, a URI does not require medical attention, but sometimes a bacterial infection in the upper airways can follow a viral infection. This is called a secondary infection. Sinus and middle ear infections are common types of secondary upper respiratory infections. Bacterial pneumonia can also complicate a URI. A URI can worsen asthma and chronic obstructive pulmonary disease (COPD). Sometimes, these complications can require emergency medical care and may be life threatening. What are the causes? Almost all URIs are caused by viruses. A virus is a type of germ and can spread from one person to another. What increases the risk? You may be at risk for a URI if:  You smoke.  You have chronic heart or lung disease.  You have a weakened defense (immune) system.  You are very young or very old.  You have nasal allergies or asthma.  You work in crowded or poorly ventilated areas.  You work in health care facilities or schools. What are the signs or symptoms? Symptoms typically develop 2-3 days after you come in contact with a cold virus. Most viral URIs last 7-10 days. However, viral URIs from the influenza virus (flu virus) can last 14-18 days and are typically more severe. Symptoms may include:  Runny or stuffy (congested) nose.  Sneezing.  Cough.  Sore throat.  Headache.  Fatigue.  Fever.  Loss of  appetite.  Pain in your forehead, behind your eyes, and over your cheekbones (sinus pain).  Muscle aches. How is this diagnosed? Your health care provider may diagnose a URI by:  Physical exam.  Tests to check that your symptoms are not due to another condition such as:  Strep throat.  Sinusitis.  Pneumonia.  Asthma. How is this treated? A URI goes away on its own with time. It cannot be cured with medicines, but medicines may be prescribed or recommended to relieve symptoms. Medicines may help:  Reduce your fever.  Reduce your cough.  Relieve nasal congestion. Follow these instructions at home:  Take medicines only as directed by your health care provider.  Gargle warm saltwater or take cough drops to comfort your throat as directed by your health care provider.  Use a warm mist humidifier or inhale steam from a shower to increase air moisture. This may make it easier to breathe.  Drink enough fluid to keep your urine clear or pale yellow.  Eat soups and other clear broths and maintain good nutrition.  Rest as needed.  Return to work when your temperature has returned to normal or as your health care provider advises. You may need to stay home longer to avoid infecting others. You can also use a face mask and careful hand washing to prevent spread of the virus.  Increase the usage of your inhaler if you have asthma.  Do not use any tobacco products, including cigarettes, chewing tobacco, or electronic cigarettes. If you need help quitting, ask  your health care provider. How is this prevented? The best way to protect yourself from getting a cold is to practice good hygiene.  Avoid oral or hand contact with people with cold symptoms.  Wash your hands often if contact occurs. There is no clear evidence that vitamin C, vitamin E, echinacea, or exercise reduces the chance of developing a cold. However, it is always recommended to get plenty of rest, exercise, and  practice good nutrition. Contact a health care provider if:  You are getting worse rather than better.  Your symptoms are not controlled by medicine.  You have chills.  You have worsening shortness of breath.  You have brown or red mucus.  You have yellow or brown nasal discharge.  You have pain in your face, especially when you bend forward.  You have a fever.  You have swollen neck glands.  You have pain while swallowing.  You have white areas in the back of your throat. Get help right away if:  You have severe or persistent:  Headache.  Ear pain.  Sinus pain.  Chest pain.  You have chronic lung disease and any of the following:  Wheezing.  Prolonged cough.  Coughing up blood.  A change in your usual mucus.  You have a stiff neck.  You have changes in your:  Vision.  Hearing.  Thinking.  Mood. This information is not intended to replace advice given to you by your health care provider. Make sure you discuss any questions you have with your health care provider. Document Released: 12/25/2000 Document Revised: 03/03/2016 Document Reviewed: 10/06/2013 Elsevier Interactive Patient Education  2017 [employer].

## 2016-10-09 NOTE — Progress Notes (Signed)
Pre visit review using our clinic review tool, if applicable. No additional management support is needed unless otherwise documented below in the visit note. 

## 2017-01-29 ENCOUNTER — Telehealth: Payer: Self-pay | Admitting: *Deleted

## 2017-01-29 ENCOUNTER — Encounter: Payer: Self-pay | Admitting: *Deleted

## 2017-01-29 NOTE — Telephone Encounter (Signed)
Sent message in My Chart to call and schedule Medicare Wellness Visit.

## 2017-05-12 ENCOUNTER — Ambulatory Visit (INDEPENDENT_AMBULATORY_CARE_PROVIDER_SITE_OTHER): Payer: Medicare Other | Admitting: Family Medicine

## 2017-05-12 ENCOUNTER — Encounter: Payer: Self-pay | Admitting: Family Medicine

## 2017-05-12 VITALS — BP 160/87 | HR 65 | Temp 98.4°F | Resp 20 | Wt 200.8 lb

## 2017-05-12 DIAGNOSIS — E278 Other specified disorders of adrenal gland: Secondary | ICD-10-CM

## 2017-05-12 DIAGNOSIS — I700 Atherosclerosis of aorta: Secondary | ICD-10-CM

## 2017-05-12 DIAGNOSIS — R319 Hematuria, unspecified: Secondary | ICD-10-CM | POA: Diagnosis not present

## 2017-05-12 DIAGNOSIS — E782 Mixed hyperlipidemia: Secondary | ICD-10-CM

## 2017-05-12 DIAGNOSIS — E279 Disorder of adrenal gland, unspecified: Secondary | ICD-10-CM | POA: Diagnosis not present

## 2017-05-12 DIAGNOSIS — N400 Enlarged prostate without lower urinary tract symptoms: Secondary | ICD-10-CM

## 2017-05-12 LAB — BASIC METABOLIC PANEL
BUN: 16 mg/dL (ref 6–23)
CHLORIDE: 104 meq/L (ref 96–112)
CO2: 28 meq/L (ref 19–32)
CREATININE: 1.06 mg/dL (ref 0.40–1.50)
Calcium: 9.4 mg/dL (ref 8.4–10.5)
GFR: 73.12 mL/min (ref >60.00)
GLUCOSE: 85 mg/dL (ref 70–99)
Potassium: 4.1 mEq/L (ref 3.5–5.1)
Sodium: 140 mEq/L (ref 135–145)

## 2017-05-12 MED ORDER — AMLODIPINE BESYLATE 10 MG PO TABS: ORAL_TABLET | ORAL | 2 refills | Status: DC

## 2017-05-12 NOTE — Progress Notes (Signed)
Nathan [name] , 1945-10-18, 71 y.o., male MRN: [id number] Patient Care Team    Relationship Specialty Notifications Start End  Ma Hillock, DO PCP - General Family Medicine  12/07/15     Chief Complaint  Patient presents with  . Hematuria     Subjective:  Hematuria: Patient presents today for an acute office visit secondary to onset of frank hematuria that started yesterday. Patient reports after urinating yesterday he noticed his urine was completely red. Patient reports he did start an OTC supplement for inflammation (Curcumin)  The 1-2 day prior to his onset of symptoms. He denies any fever, chills, nausea or vomit. He denies any low back pain, more so than his chronic. He endorses mild bladder pressure. He reports urinary output is his normal. He reports his urinary stream is his normal. Patient had a history of mildly elevated PSA (4.61-5.77 since 2007), refer to urology and did not follow through. Patient has had an adrenal mass, probable adenoma with mild growth June 2017, was referred for the second time to endocrinology, and did not follow through. Patient has had a  prostate nodule with biopsy many years ago, which he reports was normal. He has had a history of hematuria. He does not have history of kidney stones. CT showed mild prominent prostate June 2017, and patient was having mild urinary symptoms at that time.   Hypertension: Pt reports NON-compliance with amlodipine 10 mg. Blood pressures ranges at home not checked. Patient denies chest pain, shortness of breath or lower extremity edema. Patient reports he has not taken amlodipine in quite a few months. He has refused statin medication. He does not take a daily baby aspirin. Patient states he feels that the amlodipine 10 mg never really control his blood pressure, therefore he did not see any recent and taking it. He believes that medications for blood pressure will cause kidney damage. BMP: 06/28/2016, sodium 133, glucose  126, calcium 8, GFR >60, creatinine 0.96 CBC: 06/28/2012 hemoglobin 11.6, hematocrit 33.7. Diet: Avoid adding sodium Exercise: Does not exercise routinely. RF: Noncompliance, hypertension, former smoker, fhx heart disease, BMI>25  Depression screen [facility] 2/9 05/12/2017 08/24/2015 08/24/2015 02/22/2013  Decreased Interest 0 2 2 0  Down, Depressed, Hopeless 0 2 2 0  PHQ - 2 Score 0 4 4 0  Altered sleeping - 0 0 -  Tired, decreased energy - 3 2 -  Change in appetite - 2 1 -  Feeling bad or failure about yourself  - 2 2 -  Trouble concentrating - 2 2 -  Moving slowly or fidgety/restless - 0 0 -  Suicidal thoughts - 1 0 -  PHQ-9 Score - 14 11 -  Difficult doing work/chores - Very difficult Somewhat difficult -    Allergies  Allergen Reactions  . Morphine And Related Other (See Comments)    Severe drop in blood pressure  . Citalopram Hydrobromide Other (See Comments)    PATIENT PREFERENCE "Just ineffective"  . Sertraline Hcl Other (See Comments)    REACTION: headache   Social History  Substance Use Topics  . Smoking status: Former Smoker    Quit date: 07/15/1968  . Smokeless tobacco: Never Used     Comment: smoked [place], up to 1ppd  . Alcohol use 1.2 - 1.8 oz/week    2 - 3 Shots of liquor per week   Past Medical History:  Diagnosis Date  . Allergy    hay fever  . Anxiety   .  Cataract   . Complication of anesthesia    hypoglycemia, low blood pressure, pt. reports it dropped to zero- post op MORPHINE , then taken ICU due to either the morphine or hypoglycemia . In addition post op from cataract surgery- 07/2015, experienced an episode of hypoglycemia    . Depressive disorder, not elsewhere classified   . Diverticulosis   . Elevated prostate specific antigen (PSA) 2008   Dr Jeffie Pollock  . H/O cardiovascular stress test >10 yrs.   told that its wnl. Told to drink less coffee  . Hx of migraines   . Hypertension   . Hypoglycemia   . IBS (irritable bowel syndrome)   . Inguinal  hernia 12/2015   Bilateral small indirect hernias on CT, umbilical hernia also present.  . Macular degeneration (senile) of retina, unspecified    2 different opinions from 2 different Ophth  . Osteoarthrosis, unspecified whether generalized or localized, unspecified site   . Other and unspecified hyperlipidemia   . Prostatitis 2008  . Refusal of blood transfusions as patient is Jehovah's Witness   . 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   Past Surgical History:  Procedure Laterality Date  . CATARACT EXTRACTION, BILATERAL    . COLONOSCOPY  2012   Dr Olevia Perches  . CYSTOSCOPY     for hematuria  . hydrocoelectomy  2003   Dr Jeffie Pollock  . KNEE ARTHROSCOPY Left 1994  . prostate nodule resection  2003  . TONSILLECTOMY    . TOTAL KNEE ARTHROPLASTY  2009  . TOTAL KNEE ARTHROPLASTY Right 06/27/2016   Procedure: TOTAL KNEE ARTHROPLASTY;  Surgeon: Leandrew Koyanagi, MD;  Location: [facility];  Service: Orthopedics;  Laterality: Right;   Family History  Problem Relation Age of Onset  . Lung cancer Father        smoker  . Alcohol abuse Father   . Tuberculosis Father   . Heart disease Father   . Early death Father   . Diabetes Other   . Heart disease Mother   . Stroke Neg Hx    Allergies as of 05/12/2017      Reactions   Morphine And Related Other (See Comments)   Severe drop in blood pressure   Citalopram Hydrobromide Other (See Comments)   PATIENT PREFERENCE "Just ineffective"   Sertraline Hcl Other (See Comments)   REACTION: headache      Medication List       Accurate as of 05/12/17 11:59 PM. Always use your most recent med list.          amLODipine 10 MG tablet Commonly known as:  NORVASC TAKE 1 TABLET (5 MG TOTAL) BY MOUTH DAILY.   b complex vitamins tablet Take 1 tablet by mouth every other day.   IODINE (KELP) PO Take 1 tablet by mouth daily.   NF FORMULAS TESTOSTERONE Caps Take 1 tablet by mouth daily.   OVER THE COUNTER MEDICATION Take 1  tablet by mouth daily. [place]  Immune System Supplement   sodium chloride 0.65 % Soln nasal spray Commonly known as:  OCEAN Place 1 spray into both nostrils 2 (two) times daily as needed for congestion.   vitamin C 500 MG tablet Commonly known as:  ASCORBIC ACID Take 500 mg by mouth 3 (three) times daily.       All past medical history, surgical history, allergies, family history, immunizations andmedications were updated in the EMR today and reviewed under the history and medication portions  of their EMR.     ROS: Negative, with the exception of above mentioned in HPI   Objective:  BP (!) 160/87 (BP Location: Right Arm, Patient Position: Sitting, Cuff Size: Large)   Pulse 65   Temp 98.4 F (36.9 C)   Resp 20   Wt 200 lb 12 oz (91.1 kg)   SpO2 97%   BMI 28.40 kg/m  Body mass index is 28.4 kg/m. Gen: Afebrile. No acute distress. Nontoxic in appearance, well developed, well nourished. Caucasian male, mildly overweight. HENT: AT. Nathan [name]. MMM, no oral lesions.  Eyes:Pupils Equal Round Reactive to light, Extraocular movements intact,  Conjunctiva without redness, discharge or icterus. CV: RRR no murmur, no edema Chest: CTAB, no wheeze or crackles. Good air movement, normal resp effort.  Abd: Soft. Flat. NTND. BS present. No Masses palpated. No rebound or guarding. MSK: No CVA tenderness bilaterally. Neuro:  Normal gait. PERLA. EOMi. Alert. Oriented x3   No exam data present No results found. No results found for this or any previous visit (from the past 24 hour(s)).  Assessment/Plan: ZENON LEAF is a 71 y.o. male present for OV for  Adrenal mass (HCC)/right kidney mass/elevated PSA/enlarged prostate Hematuria, unspecified type - Recurrent hematuria. Frank hematuria on collection, sent for urinalysis and urine culture. BMP to check kidney function. - Discontinue curcumin supplements. - CT completed 12/14/2015 right adrenal mass previously 1.0 cm had grown to 1.7 cm,  compatible with adenoma. Right kidney mass lower pole compatible with cyst previously 5 mm increased to 1 cm. enlarged prostate gland with sclerotic lesion left greater trochanter. - Patient did not follow-up with urology or endocrine on the above matters after referred. - Urinalysis, Routine w reflex microscopic - Urine Culture - Basic Metabolic Panel (BMET) - Currently asymptomatic with the exception of mild bladder discomfort. Possibly bladder irritation secondary to curcumin use. Will await urinalysis and culture results before initiating antibiotic treatment. - Retest urine in 2 weeks at follow-up.   Mixed hyperlipidemia/Atherosclerosis of aorta (HCC)/hypertension - Long discussion today surrounding hypertension, medications, mechanism of action, untreated hypertension and anatomy and physiology. Patient advised his noncompliance is is likely causing damage to his eyes, heart and kidneys. Patient has concerns medications causes damage resulted in blood pressure. Attempted to appropriately educate patient and provide guidance on medications. Also advised him there is a potential his adenoma in renal mass could be causing difficulty to control blood pressure and his recurrent hematuria. - He has refused statin. - Restart amlodipine 10 mg daily, follow-up in 2 weeks with provider appointment. Discussed with patient if his blood pressure isn't adequately controlled at that visit, would likely need to start an additional blood pressure medication. - Low-sodium diet. Exercise encouraged greater than 150 minutes a week.  Reviewed expectations re: course of current medical issues.  Discussed self-management of symptoms.  Outlined signs and symptoms indicating need for more acute intervention.  Patient verbalized understanding and all questions were answered.  Patient received an After-Visit Summary.    Orders Placed This Encounter  Procedures  . Urine Culture  . MICROSCOPIC MESSAGE  .  Urinalysis, Routine w reflex microscopic  . Basic Metabolic Panel (BMET)     Note is dictated utilizing voice recognition software. Although note has been proof read prior to signing, occasional typographical errors still can be missed. If any questions arise, please do not hesitate to call for verification.   electronically signed by:  Howard Pouch, DO  [place]

## 2017-05-12 NOTE — Patient Instructions (Addendum)
I have sent your urine for culture. If it is negative and you continue to have blood in your urine then we will ned to obtain imaging and urology consult.    Start amlodipine 10 mg a day. Low sodium.   Follow up in 2 weeks on BP and recollect to urine.       DASH Eating Plan DASH stands for "Dietary Approaches to Stop Hypertension." The DASH eating plan is a healthy eating plan that has been shown to reduce high blood pressure (hypertension). It may also reduce your risk for type 2 diabetes, heart disease, and stroke. The DASH eating plan may also help with weight loss. What are tips for following this plan? General guidelines  Avoid eating more than 2,300 mg (milligrams) of salt (sodium) a day. If you have hypertension, you may need to reduce your sodium intake to 1,500 mg a day.  Limit alcohol intake to no more than 1 drink a day for nonpregnant women and 2 drinks a day for men. One drink equals 12 oz of beer, 5 oz of wine, or 1 oz of hard liquor.  Work with your health care provider to maintain a healthy body weight or to lose weight. Ask what an ideal weight is for you.  Get at least 30 minutes of exercise that causes your heart to beat faster (aerobic exercise) most days of the week. Activities may include walking, swimming, or biking.  Work with your health care provider or diet and nutrition specialist (dietitian) to adjust your eating plan to your individual calorie needs. Reading food labels  Check food labels for the amount of sodium per serving. Choose foods with less than 5 percent of the Daily Value of sodium. Generally, foods with less than 300 mg of sodium per serving fit into this eating plan.  To find whole grains, look for the word "whole" as the first word in the ingredient list. Shopping  Buy products labeled as "low-sodium" or "no salt added."  Buy fresh foods. Avoid canned foods and premade or frozen meals. Cooking  Avoid adding salt when cooking. Use  salt-free seasonings or herbs instead of table salt or sea salt. Check with your health care provider or pharmacist before using salt substitutes.  Do not fry foods. Cook foods using healthy methods such as baking, boiling, grilling, and broiling instead.  Cook with heart-healthy oils, such as olive, canola, soybean, or sunflower oil. Meal planning   Eat a balanced diet that includes: ? 5 or more servings of fruits and vegetables each day. At each meal, try to fill half of your plate with fruits and vegetables. ? Up to 6-8 servings of whole grains each day. ? Less than 6 oz of lean meat, poultry, or fish each day. A 3-oz serving of meat is about the same size as a deck of cards. One egg equals 1 oz. ? 2 servings of low-fat dairy each day. ? A serving of nuts, seeds, or beans 5 times each week. ? Heart-healthy fats. Healthy fats called Omega-3 fatty acids are found in foods such as flaxseeds and coldwater fish, like sardines, salmon, and mackerel.  Limit how much you eat of the following: ? Canned or prepackaged foods. ? Food that is high in trans fat, such as fried foods. ? Food that is high in saturated fat, such as fatty meat. ? Sweets, desserts, sugary drinks, and other foods with added sugar. ? Full-fat dairy products.  Do not salt foods before eating.  Try  to eat at least 2 vegetarian meals each week.  Eat more home-cooked food and less restaurant, buffet, and fast food.  When eating at a restaurant, ask that your food be prepared with less salt or no salt, if possible. What foods are recommended? The items listed may not be a complete list. Talk with your dietitian about what dietary choices are best for you. Grains Whole-grain or whole-wheat bread. Whole-grain or whole-wheat pasta. Brown rice. [place]. Bulgur. Whole-grain and low-sodium cereals. Pita bread. Low-fat, low-sodium crackers. Whole-wheat flour tortillas. Vegetables Fresh or frozen vegetables (raw, steamed,  roasted, or grilled). Low-sodium or reduced-sodium tomato and vegetable juice. Low-sodium or reduced-sodium tomato sauce and tomato paste. Low-sodium or reduced-sodium canned vegetables. Fruits All fresh, dried, or frozen fruit. Canned fruit in natural juice (without added sugar). Meat and other protein foods Skinless chicken or [place]. Ground chicken or [place]. Pork with fat trimmed off. Fish and seafood. Egg whites. Dried beans, peas, or lentils. Unsalted nuts, nut butters, and seeds. Unsalted canned beans. Lean cuts of beef with fat trimmed off. Low-sodium, lean deli meat. Dairy Low-fat (1%) or fat-free (skim) milk. Fat-free, low-fat, or reduced-fat cheeses. Nonfat, low-sodium ricotta or cottage cheese. Low-fat or nonfat yogurt. Low-fat, low-sodium cheese. Fats and oils Soft margarine without trans fats. Vegetable oil. Low-fat, reduced-fat, or light mayonnaise and salad dressings (reduced-sodium). Canola, safflower, olive, soybean, and sunflower oils. Avocado. Seasoning and other foods Herbs. Spices. Seasoning mixes without salt. Unsalted popcorn and pretzels. Fat-free sweets. What foods are not recommended? The items listed may not be a complete list. Talk with your dietitian about what dietary choices are best for you. Grains Baked goods made with fat, such as croissants, muffins, or some breads. Dry pasta or rice meal packs. Vegetables Creamed or fried vegetables. Vegetables in a cheese sauce. Regular canned vegetables (not low-sodium or reduced-sodium). Regular canned tomato sauce and paste (not low-sodium or reduced-sodium). Regular tomato and vegetable juice (not low-sodium or reduced-sodium). Angie Fava. Olives. Fruits Canned fruit in a light or heavy syrup. Fried fruit. Fruit in cream or butter sauce. Meat and other protein foods Fatty cuts of meat. Ribs. Fried meat. Berniece Salines. Sausage. Bologna and other processed lunch meats. Salami. Fatback. Hotdogs. Bratwurst. Salted nuts and seeds. Canned  beans with added salt. Canned or smoked fish. Whole eggs or egg yolks. Chicken or [place] with skin. Dairy Whole or 2% milk, cream, and half-and-half. Whole or full-fat cream cheese. Whole-fat or sweetened yogurt. Full-fat cheese. Nondairy creamers. Whipped toppings. Processed cheese and cheese spreads. Fats and oils Butter. Stick margarine. Lard. Shortening. Ghee. Bacon fat. Tropical oils, such as coconut, palm kernel, or palm oil. Seasoning and other foods Salted popcorn and pretzels. Onion salt, garlic salt, seasoned salt, table salt, and sea salt. Worcestershire sauce. Tartar sauce. Barbecue sauce. Teriyaki sauce. Soy sauce, including reduced-sodium. Steak sauce. Canned and packaged gravies. Fish sauce. Oyster sauce. Cocktail sauce. Horseradish that you find on the shelf. Ketchup. Mustard. Meat flavorings and tenderizers. Bouillon cubes. Hot sauce and Tabasco sauce. Premade or packaged marinades. Premade or packaged taco seasonings. Relishes. Regular salad dressings. Where to find more information:  National Heart, Lung, and [place]: https://wilson-eaton.com/  American Heart Association: www.heart.org Summary  The DASH eating plan is a healthy eating plan that has been shown to reduce high blood pressure (hypertension). It may also reduce your risk for type 2 diabetes, heart disease, and stroke.  With the DASH eating plan, you should limit salt (sodium) intake to 2,300 mg a day. If you  have hypertension, you may need to reduce your sodium intake to 1,500 mg a day.  When on the DASH eating plan, aim to eat more fresh fruits and vegetables, whole grains, lean proteins, low-fat dairy, and heart-healthy fats.  Work with your health care provider or diet and nutrition specialist (dietitian) to adjust your eating plan to your individual calorie needs. This information is not intended to replace advice given to you by your health care provider. Make sure you discuss any questions you have with your  health care provider. Document Released: 06/20/2011 Document Revised: 06/24/2016 Document Reviewed: 06/24/2016 Elsevier Interactive Patient Education  2017 [employer].

## 2017-05-13 ENCOUNTER — Encounter: Payer: Self-pay | Admitting: Family Medicine

## 2017-05-13 LAB — URINALYSIS, ROUTINE W REFLEX MICROSCOPIC
Bilirubin Urine: NEGATIVE
Glucose, UA: NEGATIVE
HYALINE CAST: NONE SEEN /LPF
Ketones, ur: NEGATIVE
Nitrite: POSITIVE — AB
PH: 7.5 (ref 5.0–8.0)
SPECIFIC GRAVITY, URINE: 1.015 (ref 1.001–1.03)
Squamous Epithelial / LPF: NONE SEEN /HPF (ref <=5)

## 2017-05-14 ENCOUNTER — Telehealth: Payer: Self-pay | Admitting: Family Medicine

## 2017-05-14 DIAGNOSIS — N281 Cyst of kidney, acquired: Secondary | ICD-10-CM

## 2017-05-14 DIAGNOSIS — R319 Hematuria, unspecified: Secondary | ICD-10-CM | POA: Insufficient documentation

## 2017-05-14 DIAGNOSIS — R310 Gross hematuria: Secondary | ICD-10-CM

## 2017-05-14 DIAGNOSIS — N400 Enlarged prostate without lower urinary tract symptoms: Secondary | ICD-10-CM

## 2017-05-14 DIAGNOSIS — E278 Other specified disorders of adrenal gland: Secondary | ICD-10-CM

## 2017-05-14 LAB — URINE CULTURE
MICRO NUMBER:: 81212524
RESULT: NO GROWTH
SPECIMEN QUALITY:: ADEQUATE

## 2017-05-14 NOTE — Telephone Encounter (Signed)
Please inform pt his labs are all good. Kidney function is good. Urine culture did not grow any bacteria.  Please ask if he is still seeing blood in his urine? If he is I do want to send him to urology and get him in urgently.  Otherwise we ill follow up as discussed and repeat urine on his appt he was asked to schedule in 2 week. Please make sure he is in a 30 min slot and is marked as a 30 minute pt moving forward.

## 2017-05-14 NOTE — Telephone Encounter (Signed)
Spoke with patient reviewed results. Patient states he is still having blood in urine but its not as dark . Let patient know we would be putting in a referral for urology and someone would call to get him scheduled.

## 2017-05-14 NOTE — Telephone Encounter (Signed)
Urology referral placed, please make sure patient still aware to follow-up here on his blood pressure as we discussed in 2 weeks.

## 2017-05-14 NOTE — Telephone Encounter (Signed)
Left message for patient to return call.

## 2017-05-15 NOTE — Telephone Encounter (Signed)
Left message for patient to follow up on BP in 2 weeks

## 2017-05-16 DIAGNOSIS — N400 Enlarged prostate without lower urinary tract symptoms: Secondary | ICD-10-CM | POA: Diagnosis not present

## 2017-05-16 DIAGNOSIS — R310 Gross hematuria: Secondary | ICD-10-CM | POA: Diagnosis not present

## 2017-05-20 ENCOUNTER — Telehealth: Payer: Self-pay | Admitting: *Deleted

## 2017-05-20 NOTE — Telephone Encounter (Signed)
Patient called and states he has seen urology and they recommend several tests/procedures he states he is no longer having bleeding and is asking if he should still get these done . He states it will cost him about 400.00 out of pocket. Explained to patient it is abnormal to have blood in urine and we would recommend he follow recommendations form Urology since they are the specialist we referred him to. Patient verbalized understanding.

## 2017-05-21 DIAGNOSIS — R310 Gross hematuria: Secondary | ICD-10-CM | POA: Diagnosis not present

## 2017-05-30 ENCOUNTER — Telehealth: Payer: Self-pay

## 2017-05-30 NOTE — Telephone Encounter (Signed)
Copied from [place] [phone]. Topic: Medicare AWV >> May 30, 2017  2:18 PM Gerilyn Nestle, RN wrote: Left message requesting him to complete AWV with health coach on Monday, 06/02/17 after appointment with PCP.

## 2017-06-02 ENCOUNTER — Telehealth: Payer: Self-pay | Admitting: Family Medicine

## 2017-06-02 ENCOUNTER — Ambulatory Visit: Payer: Medicare Other | Admitting: Family Medicine

## 2017-06-02 ENCOUNTER — Encounter: Payer: Self-pay | Admitting: Family Medicine

## 2017-06-02 VITALS — BP 151/86 | HR 79 | Temp 97.9°F | Resp 20 | Ht 71.0 in | Wt 194.0 lb

## 2017-06-02 DIAGNOSIS — I700 Atherosclerosis of aorta: Secondary | ICD-10-CM

## 2017-06-02 DIAGNOSIS — I10 Essential (primary) hypertension: Secondary | ICD-10-CM | POA: Diagnosis not present

## 2017-06-02 DIAGNOSIS — Z0000 Encounter for general adult medical examination without abnormal findings: Secondary | ICD-10-CM

## 2017-06-02 MED ORDER — AMLODIPINE BESYLATE 10 MG PO TABS
10.0000 mg | ORAL_TABLET | Freq: Every day | ORAL | 1 refills | Status: DC
Start: 2017-06-02 — End: 2017-07-01

## 2017-06-02 MED ORDER — LISINOPRIL 20 MG PO TABS: 20.0000 mg | ORAL_TABLET | Freq: Every day | ORAL | 0 refills | Status: DC

## 2017-06-02 NOTE — Progress Notes (Signed)
Nathan [name] , 08/31/1945, 71 y.o., male MRN: [id number] Patient Care Team    Relationship Specialty Notifications Start End  Ma Hillock, DO PCP - General Family Medicine  12/07/15   [facility], [facility]    06/02/17   Leandrew Koyanagi, MD Attending Physician Orthopedic Surgery  06/02/17     Chief Complaint  Patient presents with  . Hypertension  . Medicare Wellness     Subjective:   Hypertension/atheroscolerosis of aorta: Pt reports compliance with amlodipine 1 tab daily, he is now uncertain if the tab is 5 mg or 10 mg. Blood pressures at home are averaging 174Y systolic.Nathan [name] Patient denies chest pain, shortness of breath, dizziness or lower extremity edema. He has refused statin medication. He does not take a daily baby aspirin.Barriers to controlled BP: He believes that medications for blood pressure will cause kidney damage. BMP: 05/12/2017, GFR >60, creatinine 1.06 CBC: 06/28/2016 hemoglobin 11.6, hematocrit 33.7. Lipid: 02/2014- chol 311, HDL 82, ldl 207, tg 105 Diet: Avoid adding sodium Exercise: Does not exercise routinely. RF: Noncompliance, hypertension, former smoker, fhx heart disease, BMI>25  Depression screen [facility] 2/9 06/02/2017 05/12/2017 08/24/2015 08/24/2015 02/22/2013  Decreased Interest 0 0 2 2 0  Down, Depressed, Hopeless 0 0 2 2 0  PHQ - 2 Score 0 0 4 4 0  Altered sleeping - - 0 0 -  Tired, decreased energy - - 3 2 -  Change in appetite - - 2 1 -  Feeling bad or failure about yourself  - - 2 2 -  Trouble concentrating - - 2 2 -  Moving slowly or fidgety/restless - - 0 0 -  Suicidal thoughts - - 1 0 -  PHQ-9 Score - - 14 11 -  Difficult doing work/chores - - Very difficult Somewhat difficult -    Allergies  Allergen Reactions  . Morphine And Related Other (See Comments)    Severe drop in blood pressure  . Citalopram Hydrobromide Other (See Comments)    PATIENT PREFERENCE "Just ineffective"  . Sertraline Hcl Other (See Comments)    REACTION:  headache   Social History   Tobacco Use  . Smoking status: Former Smoker    Last attempt to quit: 07/15/1968    Years since quitting: 48.9  . Smokeless tobacco: Never Used  . Tobacco comment: smoked [place], up to 1ppd  Substance Use Topics  . Alcohol use: Yes    Alcohol/week: 1.2 - 1.8 oz    Types: 2 - 3 Shots of liquor per week   Past Medical History:  Diagnosis Date  . Allergy    hay fever  . Anxiety   . Cataract   . Complication of anesthesia    hypoglycemia, low blood pressure, pt. reports it dropped to zero- post op MORPHINE , then taken ICU due to either the morphine or hypoglycemia . In addition post op from cataract surgery- 07/2015, experienced an episode of hypoglycemia    . Depressive disorder, not elsewhere classified   . Diverticulosis   . Elevated prostate specific antigen (PSA) 2008   Dr Jeffie Pollock  . H/O cardiovascular stress test >10 yrs.   told that its wnl. Told to drink less coffee  . Hx of migraines   . Hypertension   . Hypoglycemia   . IBS (irritable bowel syndrome)   . Inguinal hernia 12/2015   Bilateral small indirect hernias on CT, umbilical hernia also present.  . Macular degeneration (senile) of retina, unspecified  2 different opinions from 2 different Ophth  . Osteoarthrosis, unspecified whether generalized or localized, unspecified site   . Other and unspecified hyperlipidemia   . Prostatitis 2008  . Refusal of blood transfusions as patient is Jehov[employer]'s Witness   . 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   Past Surgical History:  Procedure Laterality Date  . CATARACT EXTRACTION, BILATERAL    . COLONOSCOPY  2012   Dr Olevia Perches  . CYSTOSCOPY     for hematuria  . hydrocoelectomy  2003   Dr Jeffie Pollock  . KNEE ARTHROSCOPY Left 1994  . prostate nodule resection  2003  . TONSILLECTOMY    . TOTAL KNEE ARTHROPLASTY  2009  . TOTAL KNEE ARTHROPLASTY Right 06/27/2016   Performed by Leandrew Koyanagi, MD at [facility] OR   Family  History  Problem Relation Age of Onset  . Lung cancer Father        smoker  . Alcohol abuse Father   . Tuberculosis Father   . Heart disease Father   . Early death Father   . Diabetes Other   . Heart disease Mother   . Stroke Neg Hx    Allergies as of 06/02/2017      Reactions   Morphine And Related Other (See Comments)   Severe drop in blood pressure   Citalopram Hydrobromide Other (See Comments)   PATIENT PREFERENCE "Just ineffective"   Sertraline Hcl Other (See Comments)   REACTION: headache      Medication List        Accurate as of 06/02/17  8:54 AM. Always use your most recent med list.          amLODipine 10 MG tablet Commonly known as:  NORVASC TAKE 1 TABLET (5 MG TOTAL) BY MOUTH DAILY.   b complex vitamins tablet Take 1 tablet by mouth every other day.   IODINE (KELP) PO Take 1 tablet by mouth daily.   OVER THE COUNTER MEDICATION Take 1 tablet by mouth daily. [place]  Immune System Supplement   sodium chloride 0.65 % Soln nasal spray Commonly known as:  OCEAN Place 1 spray into both nostrils 2 (two) times daily as needed for congestion.   vitamin C 500 MG tablet Commonly known as:  ASCORBIC ACID Take 500 mg by mouth 3 (three) times daily.       All past medical history, surgical history, allergies, family history, immunizations andmedications were updated in the EMR today and reviewed under the history and medication portions of their EMR.     ROS: Negative, with the exception of above mentioned in HPI   Objective:  BP (!) 151/86 (BP Location: Right Arm, Patient Position: Sitting, Cuff Size: Large)   Pulse 79   Temp 97.9 F (36.6 C)   Resp 20   Ht 5\' 11"  (1.803 m)   Wt 194 lb (88 kg)   SpO2 98%   BMI 27.06 kg/m  Body mass index is 27.06 kg/m. Gen: Afebrile. No acute distress. Nontoxic in appearance, well-developed, well-nourished, Caucasian male. HENT: AT. [place].  MMM.  Eyes:Pupils Equal Round Reactive to light, Extraocular movements  intact,  Conjunctiva without redness, discharge or icterus. CV: RRR no murmur, no edema, +2/4 P posterior tibialis pulses Chest: CTAB, no wheeze or crackles Abd: Soft. NTND. BS present.  Skin: no rashes, purpura or petechiae.  Neuro: Normal gait. PERLA. EOMi. Alert. Oriented 3  Psych: Normal affect, dress and demeanor. Normal speech. Normal thought content  and judgment.Nathan [name]    Hearing Screening Comments: Hearing aids bilaterally.  Vision Screening Comments: Last exam 05/16/2015, only with changes. [employer]. H/O Cataract surgery.  No results found. No results found for this or any previous visit (from the past 24 hour(s)).  Assessment/Plan: GUMARO BRIGHTBILL is a 71 y.o. male present for OV for  Adrenal mass (HCC)/right kidney mass/elevated PSA/enlarged prostate Hematuria, unspecified type - Now following with urology. Has had repeat CT, and he is make an appointment to review results with urologist and have cystoscopy.  Mixed hyperlipidemia/Atherosclerosis of aorta (HCC)/hypertension - Above goal. Patients pharmacy will be called to make sure he is taking amlodipine 10 mg daily. If he is taking amlodipine 10 mg daily, will add lisinopril 20 mg daily. He is only taking amlodipine 5 mg daily, with increase to 10 mg QD - He has refused statin. Encourage fish oil supplementation. Encourage daily baby aspirin. - Low-sodium diet. Exercise encouraged greater than 150 minutes a week. - Follow-up 2-4 weeks after change of therapy.   Reviewed expectations re: course of current medical issues.  Discussed self-management of symptoms.  Outlined signs and symptoms indicating need for more acute intervention.  Patient verbalized understanding and all questions were answered.  Patient received an After-Visit Summary.    No orders of the defined types were placed in this encounter.    Note is dictated utilizing voice recognition software. Although note has been proof read prior to signing,  occasional typographical errors still can be missed. If any questions arise, please do not hesitate to call for verification.   electronically signed by:  Howard Pouch, DO  [place]

## 2017-06-02 NOTE — Patient Instructions (Addendum)
It was great to see you today.  We will call you with the medication changes after we get a hold of your pharmacy and confirm current doses   Bring a copy of your living will and/or healthcare power of attorney to your next office visit.  Continue doing brain stimulating activities (puzzles, reading, adult coloring books, staying active) to keep memory sharp.    Health Maintenance, Male A healthy lifestyle and preventive care is important for your health and wellness. Ask your health care provider about what schedule of regular examinations is right for you. What should I know about weight and diet? Eat a Healthy Diet  Eat plenty of vegetables, fruits, whole grains, low-fat dairy products, and lean protein.  Do not eat a lot of foods high in solid fats, added sugars, or salt.  Maintain a Healthy Weight Regular exercise can help you achieve or maintain a healthy weight. You should:  Do at least 150 minutes of exercise each week. The exercise should increase your heart rate and make you sweat (moderate-intensity exercise).  Do strength-training exercises at least twice a week.  Watch Your Levels of Cholesterol and Blood Lipids  Have your blood tested for lipids and cholesterol every 5 years starting at 71 years of age. If you are at high risk for heart disease, you should start having your blood tested when you are 71 years old. You may need to have your cholesterol levels checked more often if: ? Your lipid or cholesterol levels are high. ? You are older than 71 years of age. ? You are at high risk for heart disease.  What should I know about cancer screening? Many types of cancers can be detected early and may often be prevented. Lung Cancer  You should be screened every year for lung cancer if: ? You are a current smoker who has smoked for at least 30 years. ? You are a former smoker who has quit within the past 15 years.  Talk to your health care provider about your screening  options, when you should start screening, and how often you should be screened.  Colorectal Cancer  Routine colorectal cancer screening usually begins at 71 years of age and should be repeated every 5-10 years until you are 71 years old. You may need to be screened more often if early forms of precancerous polyps or small growths are found. Your health care provider may recommend screening at an earlier age if you have risk factors for colon cancer.  Your health care provider may recommend using home test kits to check for hidden blood in the stool.  A small camera at the end of a tube can be used to examine your colon (sigmoidoscopy or colonoscopy). This checks for the earliest forms of colorectal cancer.  Prostate and Testicular Cancer  Depending on your age and overall health, your health care provider may do certain tests to screen for prostate and testicular cancer.  Talk to your health care provider about any symptoms or concerns you have about testicular or prostate cancer.  Skin Cancer  Check your skin from head to toe regularly.  Tell your health care provider about any new moles or changes in moles, especially if: ? There is a change in a mole's size, shape, or color. ? You have a mole that is larger than a pencil eraser.  Always use sunscreen. Apply sunscreen liberally and repeat throughout the day.  Protect yourself by wearing long sleeves, pants, a wide-brimmed hat, and  sunglasses when outside.  What should I know about heart disease, diabetes, and high blood pressure?  If you are 61-54 years of age, have your blood pressure checked every 3-5 years. If you are 88 years of age or older, have your blood pressure checked every year. You should have your blood pressure measured twice-once when you are at a hospital or clinic, and once when you are not at a hospital or clinic. Record the average of the two measurements. To check your blood pressure when you are not at a hospital  or clinic, you can use: ? An automated blood pressure machine at a pharmacy. ? A home blood pressure monitor.  Talk to your health care provider about your target blood pressure.  If you are between 74-51 years old, ask your health care provider if you should take aspirin to prevent heart disease.  Have regular diabetes screenings by checking your fasting blood sugar level. ? If you are at a normal weight and have a low risk for diabetes, have this test once every three years after the age of 30. ? If you are overweight and have a high risk for diabetes, consider being tested at a younger age or more often.  A one-time screening for abdominal aortic aneurysm (AAA) by ultrasound is recommended for men aged 83-75 years who are current or former smokers. What should I know about preventing infection? Hepatitis B If you have a higher risk for hepatitis B, you should be screened for this virus. Talk with your health care provider to find out if you are at risk for hepatitis B infection. Hepatitis C Blood testing is recommended for:  Everyone born from [birth year] through 1965.  Anyone with known risk factors for hepatitis C.  Sexually Transmitted Diseases (STDs)  You should be screened each year for STDs including gonorrhea and chlamydia if: ? You are sexually active and are younger than 71 years of age. ? You are older than 71 years of age and your health care provider tells you that you are at risk for this type of infection. ? Your sexual activity has changed since you were last screened and you are at an increased risk for chlamydia or gonorrhea. Ask your health care provider if you are at risk.  Talk with your health care provider about whether you are at high risk of being infected with HIV. Your health care provider may recommend a prescription medicine to help prevent HIV infection.  What else can I do?  Schedule regular health, dental, and eye exams.  Stay current with your vaccines  (immunizations).  Do not use any tobacco products, such as cigarettes, chewing tobacco, and e-cigarettes. If you need help quitting, ask your health care provider.  Limit alcohol intake to no more than 2 drinks per day. One drink equals 12 ounces of beer, 5 ounces of wine, or 1 ounces of hard liquor.  Do not use street drugs.  Do not share needles.  Ask your health care provider for help if you need support or information about quitting drugs.  Tell your health care provider if you often feel depressed.  Tell your health care provider if you have ever been abused or do not feel safe at home. This information is not intended to replace advice given to you by your health care provider. Make sure you discuss any questions you have with your health care provider. Document Released: 12/28/2007 Document Revised: 02/28/2016 Document Reviewed: 04/04/2015 Elsevier Interactive Patient Education  2018  [employer].

## 2017-06-02 NOTE — Telephone Encounter (Signed)
Please call pt: - He has been taking amlodipine 10 mg a day according to the pharmacy.  - I have added the lisinopril tab daily to his regimen. This has been called in for him.  - F/U appt in 4 weeks with provider and labs for kidney function will be collected also that day.

## 2017-06-02 NOTE — Telephone Encounter (Signed)
Please call pt pharmacy and confirm he is receiving the amlodipine 10 mg tab.  - her reports taking 1 tab daily.

## 2017-06-02 NOTE — Telephone Encounter (Signed)
Spoke with patient reviewed information and instructions. Scheduled patient for follow up appt.

## 2017-06-02 NOTE — Progress Notes (Addendum)
Subjective:   Nathan [name] is a 71 y.o. male who presents for Medicare Annual/Subsequent preventive examination.  Review of Systems:  No ROS.  Medicare Wellness Visit. Additional risk factors are reflected in the social history.   Cardiac Risk Factors include: hypertension;male gender;advanced age (>76men, >53 women)   Sleep patterns: Sleeps 8 hours. Uses "SnoreRx" for snoring.  Home Safety/Smoke Alarms: Feels safe in home. Smoke alarms in place.  Living environment; residence and Firearm Safety: Lives with wife in 2 story home.  Seat Belt Safety/Bike Helmet: Wears seat belt.   Male:   CCS-colonoscopy 06/27/2011, normal. Recall 10 years.      PSA-Followed by Urology   Lab Results  Component Value Date   PSA 4.61 (H) 12/15/2015   PSA 5.77 (H) 03/03/2014   PSA 4.76 (H) 07/02/2006       Objective:    Vitals: BP (!) 151/86 (BP Location: Right Arm, Patient Position: Sitting, Cuff Size: Large)   Pulse 79   Temp 97.9 F (36.6 C)   Resp 20   Ht 5\' 11"  (1.517 m)   Wt 194 lb (88 kg)   SpO2 98%   BMI 27.06 kg/m   Body mass index is 27.06 kg/m.  Tobacco Social History   Tobacco Use  Smoking Status Former Smoker  . Last attempt to quit: 07/15/1968  . Years since quitting: 48.9  Smokeless Tobacco Never Used  Tobacco Comment   smoked [place], up to 1ppd     Counseling given: Yes Comment: smoked [place], up to 1ppd   Past Medical History:  Diagnosis Date  . Allergy    hay fever  . Anxiety   . Cataract   . Complication of anesthesia    hypoglycemia, low blood pressure, pt. reports it dropped to zero- post op MORPHINE , then taken ICU due to either the morphine or hypoglycemia . In addition post op from cataract surgery- 07/2015, experienced an episode of hypoglycemia    . Depressive disorder, not elsewhere classified   . Diverticulosis   . Elevated prostate specific antigen (PSA) 2008   Dr Jeffie Pollock  . H/O cardiovascular stress test >10 yrs.   told that its  wnl. Told to drink less coffee  . Hx of migraines   . Hypertension   . Hypoglycemia   . IBS (irritable bowel syndrome)   . Inguinal hernia 12/2015   Bilateral small indirect hernias on CT, umbilical hernia also present.  . Macular degeneration (senile) of retina, unspecified    2 different opinions from 2 different Ophth  . Osteoarthrosis, unspecified whether generalized or localized, unspecified site   . Other and unspecified hyperlipidemia   . Prostatitis 2008  . Refusal of blood transfusions as patient is Jehovah's Witness   . 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   Past Surgical History:  Procedure Laterality Date  . CATARACT EXTRACTION, BILATERAL    . COLONOSCOPY  2012   Dr Olevia Perches  . CYSTOSCOPY     for hematuria  . hydrocoelectomy  2003   Dr Jeffie Pollock  . KNEE ARTHROSCOPY Left 1994  . prostate nodule resection  2003  . TONSILLECTOMY    . TOTAL KNEE ARTHROPLASTY  2009  . TOTAL KNEE ARTHROPLASTY Right 06/27/2016   Performed by Leandrew Koyanagi, MD at [facility] OR   Family History  Problem Relation Age of Onset  . Lung cancer Father        smoker  .  Alcohol abuse Father   . Tuberculosis Father   . Heart disease Father   . Early death Father   . Diabetes Other   . Heart disease Mother   . Stroke Neg Hx    Social History   Substance and Sexual Activity  Sexual Activity Yes  . Partners: Female  . Birth control/protection: None    Outpatient Encounter Medications as of 06/02/2017  Medication Sig  . amLODipine (NORVASC) 10 MG tablet TAKE 1 TABLET (5 MG TOTAL) BY MOUTH DAILY. (Patient taking differently: Take 10 mg daily by mouth. )  . b complex vitamins tablet Take 1 tablet by mouth every other day.  . IODINE, KELP, PO Take 1 tablet by mouth daily.  [name] OVER THE COUNTER MEDICATION Take 1 tablet by mouth daily. [place]  Immune System Supplement  . sodium chloride (OCEAN) 0.65 % SOLN nasal spray Place 1 spray into both nostrils 2 (two) times daily as  needed for congestion.  . vitamin C (ASCORBIC ACID) 500 MG tablet Take 500 mg by mouth 3 (three) times daily.  . [DISCONTINUED] Misc Natural Products (NF FORMULAS TESTOSTERONE) CAPS Take 1 tablet by mouth daily.   No facility-administered encounter medications on file as of 06/02/2017.     Activities of Daily Living In your present state of health, do you have any difficulty performing the following activities: 06/02/2017 06/19/2016  Hearing? N N  Vision? N N  Difficulty concentrating or making decisions? N N  Walking or climbing stairs? N N  Dressing or bathing? N N  Doing errands, shopping? N -  Preparing Food and eating ? N -  Using the Toilet? N -  In the past six months, have you accidently leaked urine? N -  Do you have problems with loss of bowel control? N -  Managing your Medications? N -  Managing your Finances? N -  Housekeeping or managing your Housekeeping? N -  Some recent data might be hidden    Patient Care Team: Ma Hillock, DO as PCP - General (Family Medicine) [facility], [facility] Leandrew Koyanagi, MD as Attending Physician (Orthopedic Surgery)   Assessment:    Physical assessment deferred to PCP.  Exercise Activities and Dietary recommendations Current Exercise Habits: Structured exercise class, Type of exercise: treadmill, Time (Minutes): 30, Frequency (Times/Week): 3, Weekly Exercise (Minutes/Week): 90, Exercise limited by: None identified   Diet (meal preparation, eat out, water intake, caffeinated beverages, dairy products, fruits and vegetables): Drinks water  Breakfast:Coffee, fruit Lunch: salad with protein Dinner: Dance movement psychotherapist and vegetables.    Snacks on nuts Goals    . Weight (lb) < 180 lb (81.6 kg)     Lose weight by increasing activity.       Fall Risk Fall Risk  06/02/2017 06/02/2017 08/24/2015 02/22/2013  Falls in the past year? No No No No   Depression Screen PHQ 2/9 Scores 06/02/2017 05/12/2017 08/24/2015 08/24/2015  PHQ - 2  Score 0 0 4 4  PHQ- 9 Score - - 14 11    Cognitive Function MMSE - Mini Mental State Exam 06/02/2017  Orientation to time 5  Orientation to Place 5  Registration 3  Attention/ Calculation 5  Recall 3  Language- name 2 objects 2  Language- repeat 1  Language- follow 3 step command 3  Language- read & follow direction 1  Write a sentence 1  Copy design 1  Total score 30        Immunization History  Administered  Date(s) Administered  . PPD Test 03/17/2013  . Tdap 07/15/2012  . Zoster 02/06/2015   Screening Tests Health Maintenance  Topic Date Due  . INFLUENZA VACCINE  07/15/2018 (Originally 02/12/2017)  . Hepatitis C Screening  07/15/2018 (Originally 06/12/1946)  . PNA vac Low Risk Adult (1 of 2 - PCV13) 07/15/2018 (Originally 12/21/2010)  . COLONOSCOPY  06/26/2021  . TETANUS/TDAP  07/15/2022   Declines vaccines (Flu, Pneumonia and Shingles)     Plan:     Bring a copy of your living will and/or healthcare power of attorney to your next office visit.  Continue doing brain stimulating activities (puzzles, reading, adult coloring books, staying active) to keep memory sharp.     I have personally reviewed and noted the following in the patient's chart:   . Medical and social history . Use of alcohol, tobacco or illicit drugs  . Current medications and supplements . Functional ability and status . Nutritional status . Physical activity . Advanced directives . List of other physicians . Hospitalizations, surgeries, and ER visits in previous 12 months . Vitals . Screenings to include cognitive, depression, and falls . Referrals and appointments  In addition, I have reviewed and discussed with patient certain preventive protocols, quality metrics, and best practice recommendations. A written personalized care plan for preventive services as well as general preventive health recommendations were provided to patient.     Gerilyn Nestle, RN  06/02/2017   Medical  screening examination/treatment/procedure(s) were performed by non-physician practitioner and as supervising physician I was immediately available for consultation/collaboration.  I agree with above assessment and plan.  Electronically Signed by: Howard Pouch, DO [facility]

## 2017-06-02 NOTE — Telephone Encounter (Signed)
Spoke with Jenny Reichmann at [facility] he states patient is getting amlodipine 10 mg tablets as 1 tab daily.

## 2017-06-19 ENCOUNTER — Ambulatory Visit (INDEPENDENT_AMBULATORY_CARE_PROVIDER_SITE_OTHER): Payer: Medicare Other

## 2017-06-19 ENCOUNTER — Ambulatory Visit (INDEPENDENT_AMBULATORY_CARE_PROVIDER_SITE_OTHER): Payer: Medicare Other | Admitting: Orthopaedic Surgery

## 2017-06-19 ENCOUNTER — Encounter (INDEPENDENT_AMBULATORY_CARE_PROVIDER_SITE_OTHER): Payer: Self-pay | Admitting: Orthopaedic Surgery

## 2017-06-19 DIAGNOSIS — M5416 Radiculopathy, lumbar region: Secondary | ICD-10-CM

## 2017-06-19 DIAGNOSIS — M1711 Unilateral primary osteoarthritis, right knee: Secondary | ICD-10-CM

## 2017-06-19 NOTE — Progress Notes (Signed)
Office Visit Note   Patient: Nathan [name]           Date of Birth: 01-01-1946           MRN: [id number] Visit Date: 06/19/2017              Requested by: Ma Hillock, DO [address] [place], [place] [address] PCP: Ma Hillock, DO   Assessment & Plan: Visit Diagnoses:  1. Unilateral primary osteoarthritis, right knee   2. Lumbar radiculopathy     Plan: 1 year status post right total knee replacement doing well.  Patient also is presenting with lumbar radiculopathy.  Recommendations for MRI given failure of conservative treatment.  He will let us know if he wants [place] to proceed with scheduling the MRI.  Follow-up in 1 year for follow-up of his right knee.  Follow-Up Instructions: Return in about 1 year (around 06/19/2018).   Orders:  Orders Placed This Encounter  Procedures  . XR KNEE 3 VIEW RIGHT   No orders of the defined types were placed in this encounter.     Procedures: No procedures performed   Clinical Data: No additional findings.   Subjective: Chief Complaint  Patient presents with  . Right Knee - Pain    Patient is one-year status post right total knee replacement doing well.  He has no complaints.  He is also complaining of back and right foot pain.  He does have issues with his back that he is aware of.  He has been seeing a chiropractor for the last year and taking NSAIDs as needed.  This is affecting his golf game.  He does endorse a burning pain in the bottom of his foot.    Review of Systems  Constitutional: Negative.   All other systems reviewed and are negative.    Objective: Vital Signs: There were no vitals taken for this visit.  Physical Exam  Constitutional: He is oriented to person, place, and time. He appears well-developed and well-nourished.  Pulmonary/Chest: Effort normal.  Abdominal: Soft.  Neurological: He is alert and oriented to person, place, and time.  Skin: Skin is warm.  Psychiatric: He has a normal mood and  affect. His behavior is normal. Judgment and thought content normal.  Nursing note and vitals reviewed.   Ortho Exam Right knee exam is benign.  Surgical scar is fully healed.  Excellent range of motion.  Collaterals intact. Right lower extremity exam shows no focal motor or sensory deficits.  Subjective burning pain involving his foot.  Negative straight leg Specialty Comments:  No specialty comments available.  Imaging: Xr Knee 3 View Right  Result Date: 06/19/2017 Stable right total knee replacement in good alignment.  No complications.    PMFS History: Patient Active Problem List   Diagnosis Date Noted  . Hematuria 05/14/2017  . Enlarged prostate 12/18/2015  . lesion of left femur 12/18/2015  . Adrenal mass ([place]) 12/18/2015  . Cyst of kidney, acquired, right 12/18/2015  . Spondylosis of lumbar region without myelopathy or radiculopathy 12/18/2015  . Direct inguinal hernia 12/18/2015  . Atherosclerosis of aorta ([place]) 12/18/2015  . Hearing aid worn 08/24/2015  . Essential hypertension, benign 08/24/2015  . Gall stone 02/13/2015  . Diverticulosis of colon without hemorrhage 01/17/2015  . Hyperlipidemia 06/29/2007  . MACULAR DEGENERATION 06/29/2007   Past Medical History:  Diagnosis Date  . Allergy    hay fever  . Anxiety   . Cataract   . Complication of  anesthesia    hypoglycemia, low blood pressure, pt. reports it dropped to zero- post op MORPHINE , then taken ICU due to either the morphine or hypoglycemia . In addition post op from cataract surgery- 07/2015, experienced an episode of hypoglycemia    . Depressive disorder, not elsewhere classified   . Diverticulosis   . Elevated prostate specific antigen (PSA) 2008   Dr Jeffie Pollock  . H/O cardiovascular stress test >10 yrs.   told that its wnl. Told to drink less coffee  . Hx of migraines   . Hypertension   . Hypoglycemia   . IBS (irritable bowel syndrome)   . Inguinal hernia 12/2015   Bilateral small indirect hernias  on CT, umbilical hernia also present.  . Macular degeneration (senile) of retina, unspecified    2 different opinions from 2 different Ophth  . Osteoarthrosis, unspecified whether generalized or localized, unspecified site   . Other and unspecified hyperlipidemia   . Prostatitis 2008  . Refusal of blood transfusions as patient is Jehovah's Witness   . 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    Family History  Problem Relation Age of Onset  . Lung cancer Father        smoker  . Alcohol abuse Father   . Tuberculosis Father   . Heart disease Father   . Early death Father   . Diabetes Other   . Heart disease Mother   . Stroke Neg Hx     Past Surgical History:  Procedure Laterality Date  . CATARACT EXTRACTION, BILATERAL    . COLONOSCOPY  2012   Dr Olevia Perches  . CYSTOSCOPY     for hematuria  . hydrocoelectomy  2003   Dr Jeffie Pollock  . KNEE ARTHROSCOPY Left 1994  . prostate nodule resection  2003  . TONSILLECTOMY    . TOTAL KNEE ARTHROPLASTY  2009  . TOTAL KNEE ARTHROPLASTY Right 06/27/2016   Procedure: TOTAL KNEE ARTHROPLASTY;  Surgeon: Leandrew Koyanagi, MD;  Location: [facility];  Service: Orthopedics;  Laterality: Right;   Social History   Occupational History  . Not on file  Tobacco Use  . Smoking status: Former Smoker    Last attempt to quit: 07/15/1968    Years since quitting: 48.9  . Smokeless tobacco: Never Used  . Tobacco comment: smoked [place], up to 1ppd  Substance and Sexual Activity  . Alcohol use: Yes    Alcohol/week: 1.2 - 1.8 oz    Types: 2 - 3 Shots of liquor per week  . Drug use: No  . Sexual activity: Yes    Partners: Female    Birth control/protection: None

## 2017-06-30 DIAGNOSIS — N400 Enlarged prostate without lower urinary tract symptoms: Secondary | ICD-10-CM | POA: Diagnosis not present

## 2017-06-30 DIAGNOSIS — R310 Gross hematuria: Secondary | ICD-10-CM | POA: Diagnosis not present

## 2017-07-01 ENCOUNTER — Ambulatory Visit: Payer: Medicare Other | Admitting: Family Medicine

## 2017-07-01 ENCOUNTER — Encounter: Payer: Self-pay | Admitting: Family Medicine

## 2017-07-01 VITALS — BP 118/64 | HR 65 | Temp 98.1°F | Wt 197.0 lb

## 2017-07-01 DIAGNOSIS — I700 Atherosclerosis of aorta: Secondary | ICD-10-CM | POA: Diagnosis not present

## 2017-07-01 DIAGNOSIS — E782 Mixed hyperlipidemia: Secondary | ICD-10-CM | POA: Diagnosis not present

## 2017-07-01 DIAGNOSIS — D692 Other nonthrombocytopenic purpura: Secondary | ICD-10-CM

## 2017-07-01 DIAGNOSIS — I10 Essential (primary) hypertension: Secondary | ICD-10-CM

## 2017-07-01 LAB — TSH: TSH: 1.65 u[IU]/mL (ref 0.35–4.50)

## 2017-07-01 LAB — CBC
HCT: 44.5 % (ref 39.0–52.0)
HEMOGLOBIN: 14.9 g/dL (ref 13.0–17.0)
MCHC: 33.4 g/dL (ref 30.0–36.0)
MCV: 90.5 fl (ref 78.0–100.0)
PLATELETS: 289 10*3/uL (ref 150.0–400.0)
RBC: 4.92 Mil/uL (ref 4.22–5.81)
RDW: 15.4 % (ref 11.5–15.5)
WBC: 8.6 10*3/uL (ref 4.0–10.5)

## 2017-07-01 LAB — COMPREHENSIVE METABOLIC PANEL
ALBUMIN: 4.1 g/dL (ref 3.5–5.2)
ALT: 13 U/L (ref 0–53)
AST: 16 U/L (ref 0–37)
Alkaline Phosphatase: 49 U/L (ref 39–117)
BUN: 20 mg/dL (ref 6–23)
CHLORIDE: 103 meq/L (ref 96–112)
CO2: 26 meq/L (ref 19–32)
Calcium: 9.4 mg/dL (ref 8.4–10.5)
Creatinine, Ser: 1.12 mg/dL (ref 0.40–1.50)
GFR: 68.59 mL/min (ref >60.00)
Glucose, Bld: 92 mg/dL (ref 70–99)
POTASSIUM: 4.4 meq/L (ref 3.5–5.1)
SODIUM: 138 meq/L (ref 135–145)
Total Bilirubin: 0.7 mg/dL (ref 0.2–1.2)
Total Protein: 6.4 g/dL (ref 6.0–8.3)

## 2017-07-01 LAB — PROTIME-INR
INR: 1 ratio (ref 0.8–1.0)
PROTHROMBIN TIME: 10.8 s (ref 9.6–13.1)

## 2017-07-01 MED ORDER — AMLODIPINE BESYLATE 10 MG PO TABS: 10.0000 mg | ORAL_TABLET | Freq: Every day | ORAL | 1 refills | Status: DC

## 2017-07-01 MED ORDER — LISINOPRIL 20 MG PO TABS: 20.0000 mg | ORAL_TABLET | Freq: Every day | ORAL | 1 refills | Status: DC

## 2017-07-01 NOTE — Patient Instructions (Signed)
Happy Holidays! We will call you with lab results once available.   Follow up in 6 months.    Your blood pressure looks wonderful!!   Please help [place] help you:  We are honored you have chosen [facility] for your Primary Care home. Below you will find basic instructions that you may need to access in the future. Please help [place] help you by reading the instructions, which cover many of the frequent questions we experience.   Prescription refills and request:  -In order to allow more efficient response time, please call your pharmacy for all refills. They will forward the request electronically to [place]. This allows for the quickest possible response. Request left on a nurse line can take longer to refill, since these are checked as time allows between office patients and other phone calls.  - refill request can take up to 3-5 working days to complete.  - If request is sent electronically and request is appropiate, it is usually completed in 1-2 business days.  - all patients will need to be seen routinely for all chronic medical conditions requiring prescription medications (see follow-up below). If you are overdue for follow up on your condition, you will be asked to make an appointment and we will call in enough medication to cover you until your appointment (up to 30 days).  - all controlled substances will require a face to face visit to request/refill.  - if you desire your prescriptions to go through a new pharmacy, and have an active script at original pharmacy, you will need to call your pharmacy and have scripts transferred to new pharmacy. This is completed between the pharmacy locations and not by your provider.    Results: If any images or labs were ordered, it can take up to 1 week to get results depending on the test ordered and the lab/facility running and resulting the test. - Normal or stable results, which do not need further discussion, may be released to your mychart  immediately with attached note to you. A call may not be generated for normal results. Please make certain to sign up for mychart. If you have questions on how to activate your mychart you can call the front office.  - If your results need further discussion, our office will attempt to contact you via phone, and if unable to reach you after 2 attempts, we will release your abnormal result to your mychart with instructions.  - All results will be automatically released in mychart after 1 week.  - Your provider will provide you with explanation and instruction on all relevant material in your results. Please keep in mind, results and labs may appear confusing or abnormal to the untrained eye, but it does not mean they are actually abnormal for you personally. If you have any questions about your results that are not covered, or you desire more detailed explanation than what was provided, you should make an appointment with your provider to do so.   Our office handles many outgoing and incoming calls daily. If we have not contacted you within 1 week about your results, please check your mychart to see if there is a message first and if not, then contact our office.  In helping with this matter, you help decrease call volume, and therefore allow [place] to be able to respond to patients needs more efficiently.   Acute office visits (sick visit):  An acute visit is intended for a new problem and are scheduled in  shorter time slots to allow schedule openings for patients with new problems. This is the appropriate visit to discuss a new problem. In order to provide you with excellent quality medical care with proper time for you to explain your problem, have an exam and receive treatment with instructions, these appointments should be limited to one new problem per visit. If you experience a new problem, in which you desire to be addressed, please make an acute office visit, we save openings on the schedule to  accommodate you. Please do not save your new problem for any other type of visit, let us take care of it properly and quickly for you.   Follow up visits:  Depending on your condition(s) your provider will need to see you routinely in order to provide you with quality care and prescribe medication(s). Most chronic conditions (Example: hypertension, Diabetes, depression/anxiety... etc), require visits a couple times a year. Your provider will instruct you on proper follow up for your personal medical conditions and history. Please make certain to make follow up appointments for your condition as instructed. Failing to do so could result in lapse in your medication treatment/refills. If you request a refill, and are overdue to be seen on a condition, we will always provide you with a 30 day script (once) to allow you time to schedule.    Medicare wellness (well visit): - we have a wonderful Nurse Maudie Mercury), that will meet with you and provide you will yearly medicare wellness visits. These visits should occur yearly (can not be scheduled less than 1 calendar year apart) and cover preventive health, immunizations, advance directives and screenings you are entitled to yearly through your medicare benefits. Do not miss out on your entitled benefits, this is when medicare will pay for these benefits to be ordered for you.  These are strongly encouraged by your provider and is the appropriate type of visit to make certain you are up to date with all preventive health benefits. If you have not had your medicare wellness exam in the last 12 months, please make certain to schedule one by calling the office and schedule your medicare wellness with Maudie Mercury as soon as possible.   Yearly physical (well visit):  - Adults are recommended to be seen yearly for physicals. Check with your insurance and date of your last physical, most insurances require one calendar year between physicals. Physicals include all preventive health  topics, screenings, medical exam and labs that are appropriate for gender/age and history. You may have fasting labs needed at this visit. This is a well visit (not a sick visit), new problems should not be covered during this visit (see acute visit).  - Pediatric patients are seen more frequently when they are younger. Your provider will advise you on well child visit timing that is appropriate for your their age. - This is not a medicare wellness visit. Medicare wellness exams do not have an exam portion to the visit. Some medicare companies allow for a physical, some do not allow a yearly physical. If your medicare allows a yearly physical you can schedule the medicare wellness with our nurse Maudie Mercury and have your physical with your provider after, on the same day. Please check with insurance for your full benefits.   Late Policy/No Shows:  - all new patients should arrive 15-30 minutes earlier than appointment to allow [place] time  to  obtain all personal demographics,  insurance information and for you to complete office paperwork. - All established patients  should arrive 10-15 minutes earlier than appointment time to update all information and be checked in .  - In our best efforts to run on time, if you are late for your appointment you will be asked to either reschedule or if able, we will work you back into the schedule. There will be a wait time to work you back in the schedule,  depending on availability.  - If you are unable to make it to your appointment as scheduled, please call 24 hours ahead of time to allow [place] to fill the time slot with someone else who needs to be seen. If you do not cancel your appointment ahead of time, you may be charged a no show fee.

## 2017-07-01 NOTE — Progress Notes (Signed)
Nathan [name] , 01-09-1946, 71 y.o., male MRN: [id number] Patient Care Team    Relationship Specialty Notifications Start End  Ma Hillock, DO PCP - General Family Medicine  12/07/15   Pa, [facility]    06/02/17   Leandrew Koyanagi, MD Attending Physician Orthopedic Surgery  06/02/17     Chief Complaint  Patient presents with  . Hypertension    Follow up     Subjective:   Hypertension/atheroscolerosis of aorta: Pt reports compliance with amlodipine 10 mg daily and Lisinopril 20 mg daily. Patient denies chest pain, shortness of breath, dizziness or lower extremity edema.  He has refused statin medication. He does not take a daily baby aspirin.Barriers to controlled BP: He believes that medications for blood pressure will cause kidney damage. BMP: 05/12/2017, GFR >60, creatinine 1.06 CBC: 06/28/2016 hemoglobin 11.6, hematocrit 33.7. Lipid: 02/2014- chol 311, HDL 82, ldl 207, tg 105 Diet: Avoid adding sodium Exercise: Has started to exercise routinely. Using stationary bike 6 times a week for approximately 20-30 minutes. 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 He reports new bruises frequently especially on his left hand. He is right-handed. He does not take baby aspirin. He had been taking. Right has stopped. He does not take ibuprofen/Motrin or like products. This is a chronic issue, but he is wondering if there is anything he can do over-the-counter.  Depression screen [facility] 2/9 06/02/2017 05/12/2017 08/24/2015 08/24/2015 02/22/2013  Decreased Interest 0 0 2 2 0  Down, Depressed, Hopeless 0 0 2 2 0  PHQ - 2 Score 0 0 4 4 0  Altered sleeping 0 - 0 0 -  Tired, decreased energy 0 - 3 2 -  Change in appetite 0 - 2 1 -  Feeling bad or failure about yourself  0 - 2 2 -  Trouble concentrating 0 - 2 2 -  Moving slowly or fidgety/restless 0 - 0 0 -  Suicidal thoughts 0 - 1 0 -  PHQ-9  Score 0 - 14 11 -  Difficult doing work/chores Not difficult at all - Very difficult Somewhat difficult -    Allergies  Allergen Reactions  . Morphine And Related Other (See Comments)    Severe drop in blood pressure  . Citalopram Hydrobromide Other (See Comments)    PATIENT PREFERENCE "Just ineffective"  . Sertraline Hcl Other (See Comments)    REACTION: headache   Social History   Tobacco Use  . Smoking status: Former Smoker    Last attempt to quit: 07/15/1968    Years since quitting: 48.9  . Smokeless tobacco: Never Used  . Tobacco comment: smoked [place], up to 1ppd  Substance Use Topics  . Alcohol use: Yes    Alcohol/week: 1.2 - 1.8 oz    Types: 2 - 3 Shots of liquor per week   Past Medical History:  Diagnosis Date  . Allergy    hay fever  . Anxiety   . Cataract   . Complication of anesthesia    hypoglycemia, low blood pressure, pt. reports it dropped to zero- post op MORPHINE , then taken ICU due to either the morphine or hypoglycemia . In addition post op from cataract surgery- 07/2015, experienced an episode of hypoglycemia    . Depressive disorder, not elsewhere classified   . Diverticulosis   . Elevated prostate specific antigen (PSA) 2008   Dr Jeffie Pollock  . H/O cardiovascular stress  test >10 yrs.   told that its wnl. Told to drink less coffee  . Hx of migraines   . Hypertension   . Hypoglycemia   . IBS (irritable bowel syndrome)   . Inguinal hernia 12/2015   Bilateral small indirect hernias on CT, umbilical hernia also present.  . Macular degeneration (senile) of retina, unspecified    2 different opinions from 2 different Ophth  . Osteoarthrosis, unspecified whether generalized or localized, unspecified site   . Other and unspecified hyperlipidemia   . Prostatitis 2008  . Refusal of blood transfusions as patient is Jehov[employer]'s Witness   . 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   Past Surgical History:  Procedure Laterality  Date  . CATARACT EXTRACTION, BILATERAL    . COLONOSCOPY  2012   Dr Olevia Perches  . CYSTOSCOPY     for hematuria  . hydrocoelectomy  2003   Dr Jeffie Pollock  . KNEE ARTHROSCOPY Left 1994  . prostate nodule resection  2003  . TONSILLECTOMY    . TOTAL KNEE ARTHROPLASTY  2009  . TOTAL KNEE ARTHROPLASTY Right 06/27/2016   Procedure: TOTAL KNEE ARTHROPLASTY;  Surgeon: Leandrew Koyanagi, MD;  Location: [place];  Service: Orthopedics;  Laterality: Right;   Family History  Problem Relation Age of Onset  . Lung cancer Father        smoker  . Alcohol abuse Father   . Tuberculosis Father   . Heart disease Father   . Early death Father   . Diabetes Other   . Heart disease Mother   . Stroke Neg Hx    Allergies as of 07/01/2017      Reactions   Morphine And Related Other (See Comments)   Severe drop in blood pressure   Citalopram Hydrobromide Other (See Comments)   PATIENT PREFERENCE "Just ineffective"   Sertraline Hcl Other (See Comments)   REACTION: headache      Medication List        Accurate as of 07/01/17  8:38 AM. Always use your most recent med list.          amLODipine 10 MG tablet Commonly known as:  NORVASC Take 1 tablet (10 mg total) daily by mouth.   b complex vitamins tablet Take 1 tablet by mouth every other day.   IODINE (KELP) PO Take 1 tablet by mouth daily.   lisinopril 20 MG tablet Commonly known as:  PRINIVIL,ZESTRIL Take 1 tablet (20 mg total) daily by mouth.   OVER THE COUNTER MEDICATION Take 1 tablet by mouth daily. [place]  Immune System Supplement   sodium chloride 0.65 % Soln nasal spray Commonly known as:  OCEAN Place 1 spray into both nostrils 2 (two) times daily as needed for congestion.   vitamin C 500 MG tablet Commonly known as:  ASCORBIC ACID Take 500 mg by mouth 3 (three) times daily.       All past medical history, surgical history, allergies, family history, immunizations andmedications were updated in the EMR today and reviewed under the  history and medication portions of their EMR.     ROS: Negative, with the exception of above mentioned in HPI   Objective:  BP 118/64 (BP Location: Left Arm, Patient Position: Sitting, Cuff Size: Large)   Pulse 65   Temp 98.1 F (36.7 C) (Oral)   Wt 197 lb (89.4 kg)   SpO2 99%   BMI 27.48 kg/m  Body mass index is 27.48 kg/m. Gen:  Afebrile. No acute distress. Nontoxic in appearance. Well-developed, well-nourished, mildly overweight Caucasian male. HENT: AT. [place]. MMM.  Eyes:Pupils Equal Round Reactive to light, Extraocular movements intact,  Conjunctiva without redness, discharge or icterus. CV: RRR no murmurs, no edema, +2/4 P posterior tibialis pulses Chest: CTAB, no wheeze or crackles Abd: Soft. NTND. BS present.  Skin: Purpura present left dorsal hand. Neuro:  Normal gait. PERLA. EOMi. Alert. Oriented x3     No exam data present No results found. No results found for this or any previous visit (from the past 24 hour(s)).  Assessment/Plan: Nathan [name] is a 71 y.o. male present for OV for  Mixed hyperlipidemia/Atherosclerosis of aorta (HCC)/hypertension - Congratulated patient, his blood pressure is finally at goal. Continue amlodipine 10 mg daily. Continue lisinopril 20 mg daily.  - Continue exercising. Continue low sodium diet.  - Encourage fish oil supplementation.  - He does not take baby aspirin daily, secondary to easy bruising. - He has refused statin. - CBC and CMP collected today. Check kidney function after lisinopril start. 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 He denies any increased bleeding. He has had intermittent hematuria with normal workup. He does not like the appearance of the bruising.  - CBC, CMP, PT/INR collected today. - Discussed typical benign etiology of senile purpura. Kelp extract, which pt reported using, can cause increase in bleeding. Trial off medication. Could benefit 81 ASA if able to tolerate  instead.   - No follow-up needed unless PT/INR abnormal.   Reviewed expectations re: course of current medical issues.  Discussed self-management of symptoms.  Outlined signs and symptoms indicating need for more acute intervention.  Patient verbalized understanding and all questions were answered.  Patient received an After-Visit Summary.    No orders of the defined types were placed in this encounter.    Note is dictated utilizing voice recognition software. Although note has been proof read prior to signing, occasional typographical errors still can be missed. If any questions arise, please do not hesitate to call for verification.   electronically signed by:  Howard Pouch, DO  [place]

## 2017-07-02 ENCOUNTER — Encounter: Payer: Self-pay | Admitting: *Deleted

## 2017-07-02 ENCOUNTER — Telehealth: Payer: Self-pay | Admitting: Family Medicine

## 2017-07-02 NOTE — Telephone Encounter (Signed)
Left detailed message with results and instructions on patient voice mail. Sent information in My Chart also.

## 2017-07-02 NOTE — Telephone Encounter (Signed)
Please inform Mr Longton his blood work is all normal (blood count, platelets, liver, kidney, eval for bruising). I was reviewing more information available on Kelp extract which is on his list, and this can cause increase in bleeding/bruising. If he is still taking, he may want to try a trial off that supplement for a few months and see if notices any improvement.

## 2017-12-24 DIAGNOSIS — M9905 Segmental and somatic dysfunction of pelvic region: Secondary | ICD-10-CM | POA: Diagnosis not present

## 2017-12-24 DIAGNOSIS — M9903 Segmental and somatic dysfunction of lumbar region: Secondary | ICD-10-CM | POA: Diagnosis not present

## 2017-12-29 DIAGNOSIS — M9903 Segmental and somatic dysfunction of lumbar region: Secondary | ICD-10-CM | POA: Diagnosis not present

## 2017-12-29 DIAGNOSIS — M9905 Segmental and somatic dysfunction of pelvic region: Secondary | ICD-10-CM | POA: Diagnosis not present

## 2018-01-09 DIAGNOSIS — M9903 Segmental and somatic dysfunction of lumbar region: Secondary | ICD-10-CM | POA: Diagnosis not present

## 2018-01-09 DIAGNOSIS — M9905 Segmental and somatic dysfunction of pelvic region: Secondary | ICD-10-CM | POA: Diagnosis not present

## 2018-01-30 IMAGING — CT CT ABD-PELV W/ CM
2 of 5 series · 15 of 46 positions shown, 17 images · IV contrast (ISOVUE 300)
Comparison: 02/21/2005

CLINICAL DATA: Right upper quadrant abdominal pain and low back
pain for 1 year. History of gallstones.

EXAM:
CT ABDOMEN AND PELVIS WITH CONTRAST
TECHNIQUE: Multidetector CT imaging of the abdomen and pelvis was performed
using the standard protocol following bolus administration of
intravenous contrast.
CONTRAST:  100mL Z38VKY-YII IOPAMIDOL (Z38VKY-YII) INJECTION 61%

[Series 2: abd/ pelvis · axial · 0.80mm/px · z∈[-474,-54]mm · 12 of 94 slices shown, 14 images]
[im 5/94  soft-tissue]
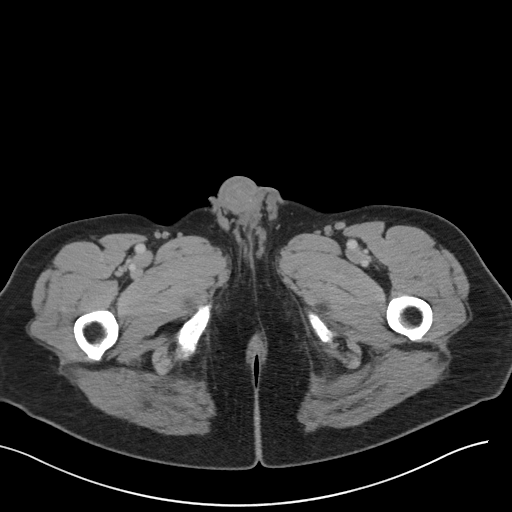
[im 5/94  bone]
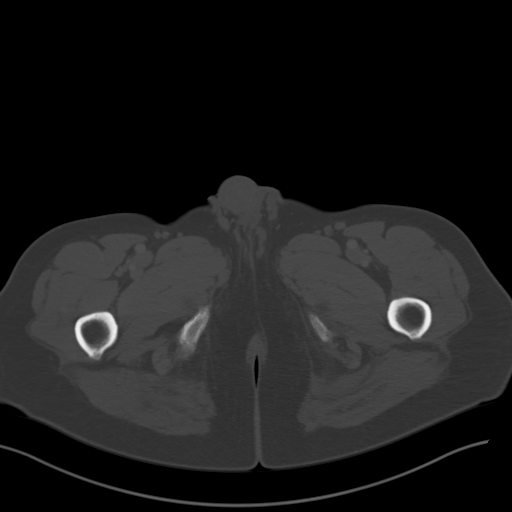
[im 15/94  soft-tissue]
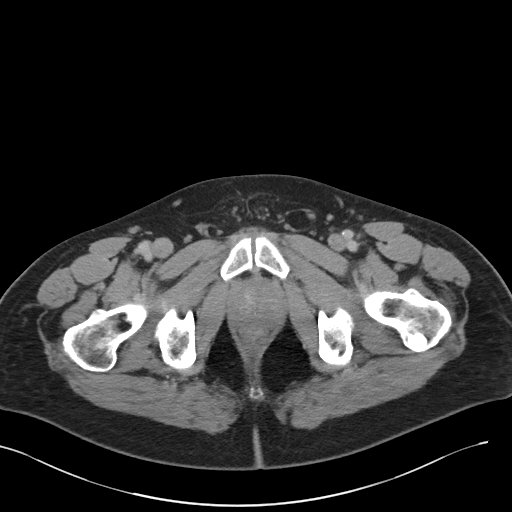
[im 20/94  soft-tissue]
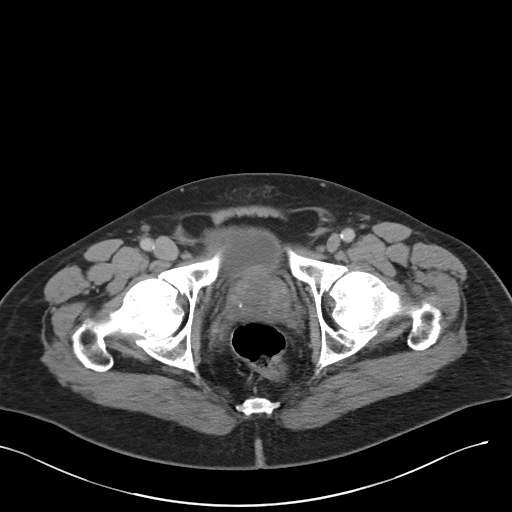
[im 30/94  soft-tissue]
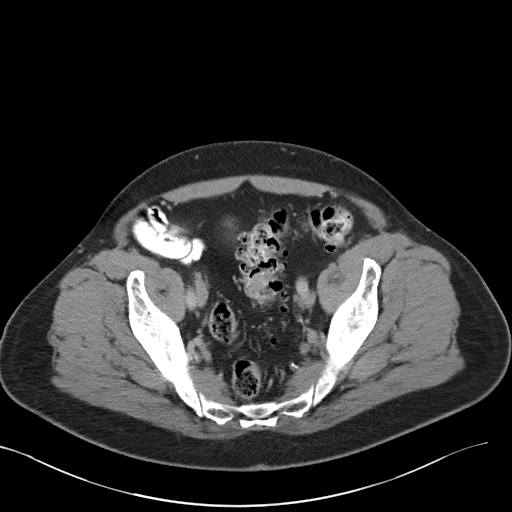
[im 35/94  soft-tissue]
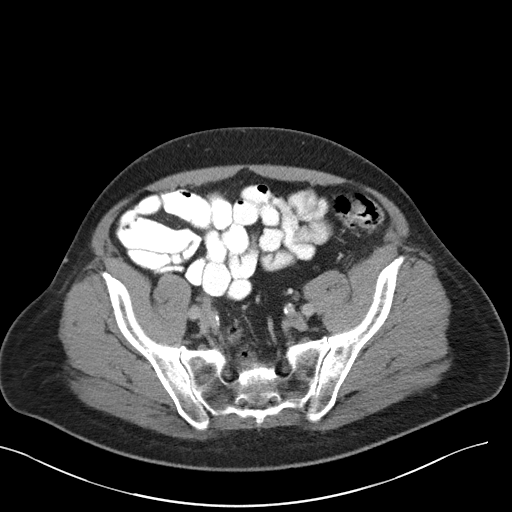
[im 45/94  soft-tissue]
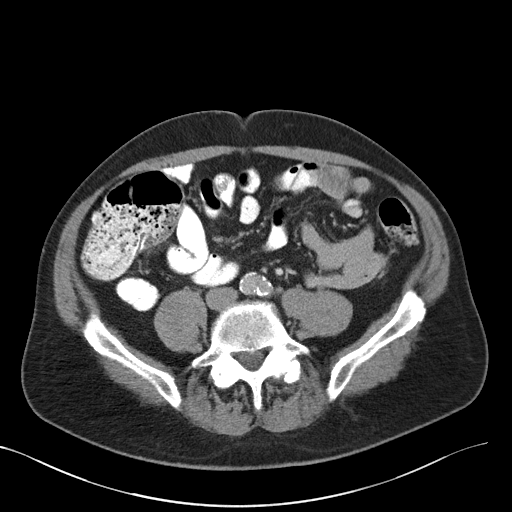
[im 49/94  soft-tissue]
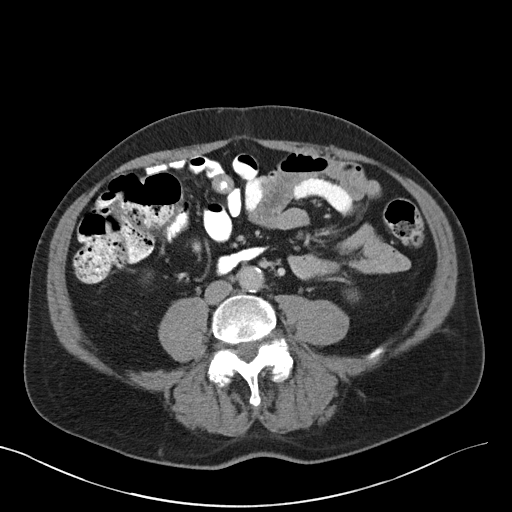
[im 59/94  soft-tissue]
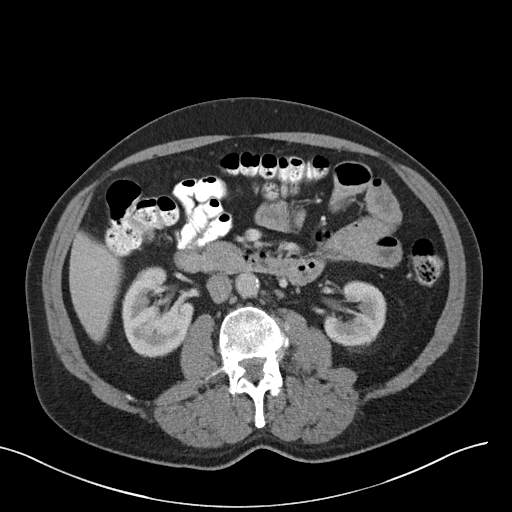
[im 64/94  soft-tissue]
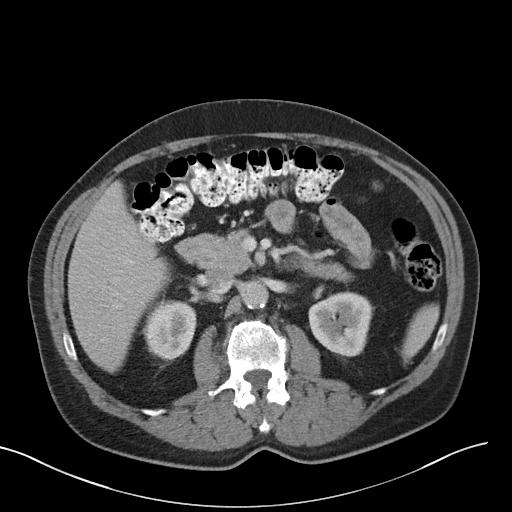
[im 64/94  bone]
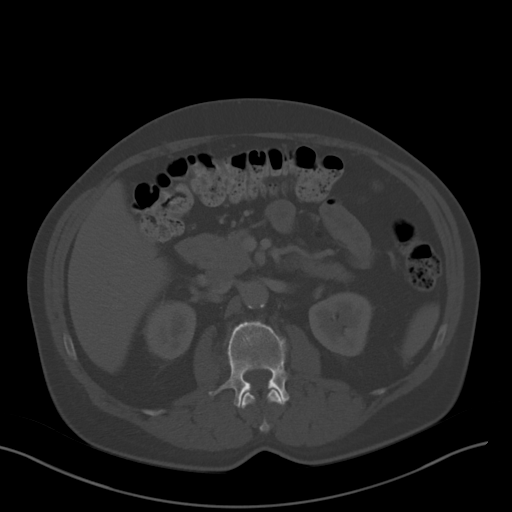
[im 74/94  soft-tissue]
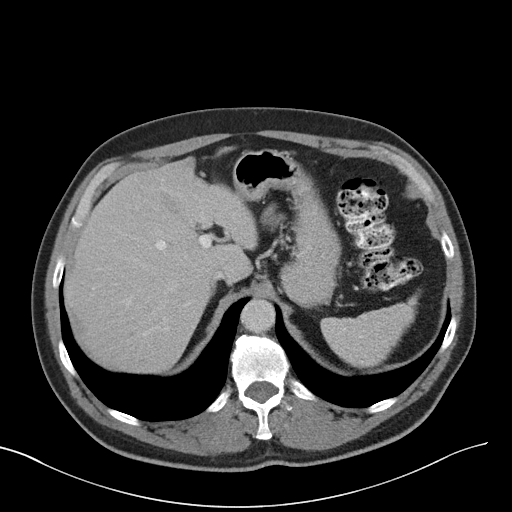
[im 79/94  soft-tissue]
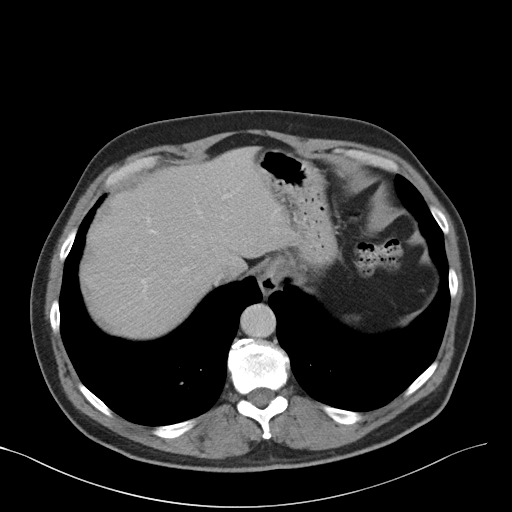
[im 89/94  soft-tissue]
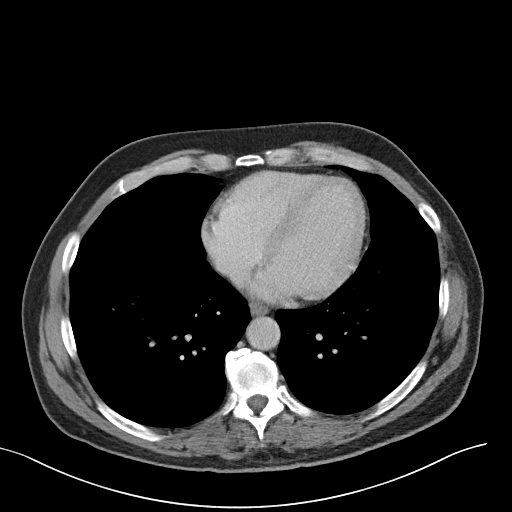

[Series 5: coronal soft tissue · coronal · 0.70mm/px · 3 of 93 slices shown]
[im 31/93  soft-tissue]
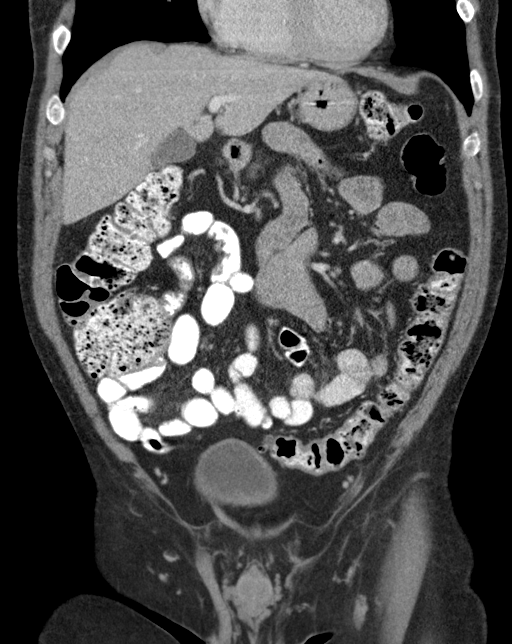
[im 41/93  soft-tissue]
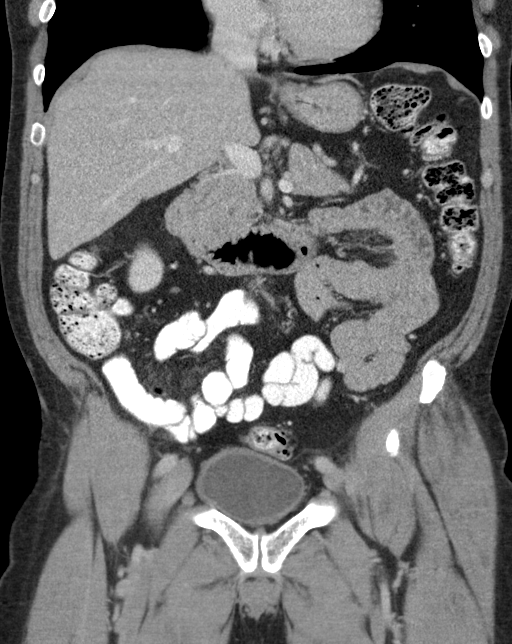
[im 52/93  soft-tissue]
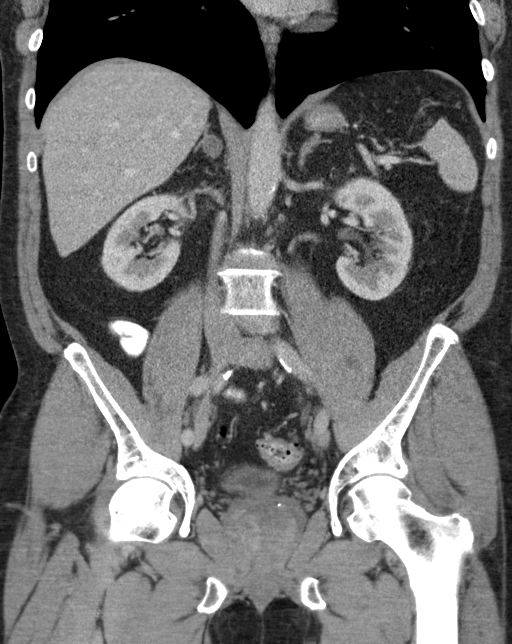

[15 of 46 positions shown; findings below may reference images not displayed]

FINDINGS: Lower chest:  Unremarkable

Hepatobiliary: Possible small gallstone on image [DATE].

Pancreas: Unremarkable

Spleen: Unremarkable

Adrenals/Urinary Tract: 1.4 by 2.0 cm medial limb right adrenal
mass, previously 1.0 by 1.7 cm, relative washout 46%, compatible
with adenoma.

Fluid density 1 cm lesion of the right kidney lower pole compatible
with cyst, previously 5 mm in diameter.

Stomach/Bowel: Sigmoid colon diverticulosis. Scattered descending
colon diverticula. Appendix normal. No active diverticulitis
identified.

Vascular/Lymphatic: Aortoiliac atherosclerotic vascular disease. No
pathologic adenopathy identified.

Reproductive: Prominent prostate gland, 4.7 by 5.9 by 6.4 cm (volume
= 92.3 cc).

Other: No supplemental non-categorized findings.

Musculoskeletal: 1.4 cm sclerotic lesion posteriorly in the left
greater trochanter, previously 0.4 cm. No cortical destruction or
extraosseous component.

Mild degenerative findings in the sacroiliac joints. Multilevel
subluxations with degenerative grade 1 retrolisthesis at L1- 2, L2-
3, and L3-4 ; and grade 1 anterolisthesis at L4-5. Spondylosis and
degenerative disc disease in addition to the subluxations contribute
to significant impingement at the L2-3, L3-4, and L4-5 levels, and
potentially mild foraminal stenosis at L5-S1 as well. No pars
defects. Multilevel Schmorl's nodes.

A small umbilical hernia contains adipose tissue. Bilateral small
direct inguinal hernias contain adipose tissue, larger on the left
than the right.
IMPRESSION: 1. There is potentially a small gallstone on image [DATE]. No
gallbladder wall thickening or additional significant hepatobiliary
abnormality identified.
2. Small right adrenal adenoma.
3. Enlarged prostate gland, volume 92 cc. There is a sclerotic
lesion posteriorly in the left greater trochanter which is probably
an enlarging bone island but which is significantly enlarged
compared to 1001. I find this much less likely to be a solitary
osseous metastatic lesion from prostate cancer, correlate with PSA
level in determining need for further workup of the prostate gland.
4. Multilevel impingement in the lumbar spine due to spondylosis,
degenerative subluxations, and degenerative disc disease.
5.  Aortoiliac atherosclerotic vascular disease.
6. Sigmoid colon and descending colon diverticulosis, without active
diverticulitis identified.
7. Small direct bilateral inguinal hernias containing adipose
tissue. Small umbilical hernia contains adipose tissue.

## 2018-03-23 ENCOUNTER — Other Ambulatory Visit: Payer: Self-pay | Admitting: *Deleted

## 2018-03-23 ENCOUNTER — Encounter: Payer: Self-pay | Admitting: *Deleted

## 2018-03-23 MED ORDER — LISINOPRIL 20 MG PO TABS: 20.0000 mg | ORAL_TABLET | Freq: Every day | ORAL | 0 refills | Status: DC

## 2018-03-30 DIAGNOSIS — H26493 Other secondary cataract, bilateral: Secondary | ICD-10-CM | POA: Diagnosis not present

## 2018-03-31 ENCOUNTER — Encounter: Payer: Medicare Other | Admitting: Family Medicine

## 2018-04-20 ENCOUNTER — Other Ambulatory Visit: Payer: Self-pay

## 2018-04-20 ENCOUNTER — Ambulatory Visit (INDEPENDENT_AMBULATORY_CARE_PROVIDER_SITE_OTHER): Payer: Medicare Other | Admitting: Family Medicine

## 2018-04-20 ENCOUNTER — Encounter: Payer: Self-pay | Admitting: Family Medicine

## 2018-04-20 VITALS — BP 155/88 | HR 67 | Temp 98.2°F | Resp 20 | Ht 71.0 in | Wt 199.5 lb

## 2018-04-20 DIAGNOSIS — I10 Essential (primary) hypertension: Secondary | ICD-10-CM

## 2018-04-20 DIAGNOSIS — R7309 Other abnormal glucose: Secondary | ICD-10-CM

## 2018-04-20 DIAGNOSIS — E663 Overweight: Secondary | ICD-10-CM

## 2018-04-20 DIAGNOSIS — E785 Hyperlipidemia, unspecified: Secondary | ICD-10-CM | POA: Diagnosis not present

## 2018-04-20 DIAGNOSIS — I700 Atherosclerosis of aorta: Secondary | ICD-10-CM | POA: Diagnosis not present

## 2018-04-20 DIAGNOSIS — Z0000 Encounter for general adult medical examination without abnormal findings: Secondary | ICD-10-CM | POA: Diagnosis not present

## 2018-04-20 DIAGNOSIS — H6123 Impacted cerumen, bilateral: Secondary | ICD-10-CM

## 2018-04-20 MED ORDER — CARBAMIDE PEROXIDE 6.5 % OT SOLN: OTIC | 0 refills | Status: DC

## 2018-04-20 MED ORDER — LISINOPRIL 20 MG PO TABS: 20.0000 mg | ORAL_TABLET | Freq: Every day | ORAL | 1 refills | Status: DC

## 2018-04-20 MED ORDER — AMLODIPINE BESYLATE 10 MG PO TABS: 10.0000 mg | ORAL_TABLET | Freq: Every day | ORAL | 1 refills | Status: DC

## 2018-04-20 NOTE — Addendum Note (Signed)
Addended by: Ralph Dowdy on: 04/20/2018 10:33 AM   Modules accepted: Orders

## 2018-04-20 NOTE — Patient Instructions (Addendum)
Look at the american heart association for information in cholesterol medications.   Health Maintenance, Male A healthy lifestyle and preventive care is important for your health and wellness. Ask your health care provider about what schedule of regular examinations is right for you. What should I know about weight and diet? Eat a Healthy Diet  Eat plenty of vegetables, fruits, whole grains, low-fat dairy products, and lean protein.  Do not eat a lot of foods high in solid fats, added sugars, or salt.  Maintain a Healthy Weight Regular exercise can help you achieve or maintain a healthy weight. You should:  Do at least 150 minutes of exercise each week. The exercise should increase your heart rate and make you sweat (moderate-intensity exercise).  Do strength-training exercises at least twice a week.  Watch Your Levels of Cholesterol and Blood Lipids  Have your blood tested for lipids and cholesterol every 5 years starting at 72 years of age. If you are at high risk for heart disease, you should start having your blood tested when you are 72 years old. You may need to have your cholesterol levels checked more often if: ? Your lipid or cholesterol levels are high. ? You are older than 72 years of age. ? You are at high risk for heart disease.  What should I know about cancer screening? Many types of cancers can be detected early and may often be prevented. Lung Cancer  You should be screened every year for lung cancer if: ? You are a current smoker who has smoked for at least 30 years. ? You are a former smoker who has quit within the past 15 years.  Talk to your health care provider about your screening options, when you should start screening, and how often you should be screened.  Colorectal Cancer  Routine colorectal cancer screening usually begins at 72 years of age and should be repeated every 5-10 years until you are 72 years old. You may need to be screened more often if  early forms of precancerous polyps or small growths are found. Your health care provider may recommend screening at an earlier age if you have risk factors for colon cancer.  Your health care provider may recommend using home test kits to check for hidden blood in the stool.  A small camera at the end of a tube can be used to examine your colon (sigmoidoscopy or colonoscopy). This checks for the earliest forms of colorectal cancer.  Prostate and Testicular Cancer  Depending on your age and overall health, your health care provider may do certain tests to screen for prostate and testicular cancer.  Talk to your health care provider about any symptoms or concerns you have about testicular or prostate cancer.  Skin Cancer  Check your skin from head to toe regularly.  Tell your health care provider about any new moles or changes in moles, especially if: ? There is a change in a mole's size, shape, or color. ? You have a mole that is larger than a pencil eraser.  Always use sunscreen. Apply sunscreen liberally and repeat throughout the day.  Protect yourself by wearing long sleeves, pants, a wide-brimmed hat, and sunglasses when outside.  What should I know about heart disease, diabetes, and high blood pressure?  If you are 62-67 years of age, have your blood pressure checked every 3-5 years. If you are 59 years of age or older, have your blood pressure checked every year. You should have your blood pressure  measured twice-once when you are at a hospital or clinic, and once when you are not at a hospital or clinic. Record the average of the two measurements. To check your blood pressure when you are not at a hospital or clinic, you can use: ? An automated blood pressure machine at a pharmacy. ? A home blood pressure monitor.  Talk to your health care provider about your target blood pressure.  If you are between 2-42 years old, ask your health care provider if you should take aspirin to  prevent heart disease.  Have regular diabetes screenings by checking your fasting blood sugar level. ? If you are at a normal weight and have a low risk for diabetes, have this test once every three years after the age of 55. ? If you are overweight and have a high risk for diabetes, consider being tested at a younger age or more often.  A one-time screening for abdominal aortic aneurysm (AAA) by ultrasound is recommended for men aged 22-75 years who are current or former smokers. What should I know about preventing infection? Hepatitis B If you have a higher risk for hepatitis B, you should be screened for this virus. Talk with your health care provider to find out if you are at risk for hepatitis B infection. Hepatitis C Blood testing is recommended for:  Everyone born from [birth year] through 1965.  Anyone with known risk factors for hepatitis C.  Sexually Transmitted Diseases (STDs)  You should be screened each year for STDs including gonorrhea and chlamydia if: ? You are sexually active and are younger than 72 years of age. ? You are older than 72 years of age and your health care provider tells you that you are at risk for this type of infection. ? Your sexual activity has changed since you were last screened and you are at an increased risk for chlamydia or gonorrhea. Ask your health care provider if you are at risk.  Talk with your health care provider about whether you are at high risk of being infected with HIV. Your health care provider may recommend a prescription medicine to help prevent HIV infection.  What else can I do?  Schedule regular health, dental, and eye exams.  Stay current with your vaccines (immunizations).  Do not use any tobacco products, such as cigarettes, chewing tobacco, and e-cigarettes. If you need help quitting, ask your health care provider.  Limit alcohol intake to no more than 2 drinks per day. One drink equals 12 ounces of beer, 5 ounces of wine, or  1 ounces of hard liquor.  Do not use street drugs.  Do not share needles.  Ask your health care provider for help if you need support or information about quitting drugs.  Tell your health care provider if you often feel depressed.  Tell your health care provider if you have ever been abused or do not feel safe at home. This information is not intended to replace advice given to you by your health care provider. Make sure you discuss any questions you have with your health care provider. Document Released: 12/28/2007 Document Revised: 02/28/2016 Document Reviewed: 04/04/2015 Elsevier Interactive Patient Education  [employer].

## 2018-04-20 NOTE — Progress Notes (Signed)
Patient ID: Nathan [name], male  DOB: [birth year], 72 y.o.   MRN: [id number] Patient Care Team    Relationship Specialty Notifications Start End  Ma Hillock, DO PCP - General Family Medicine  12/07/15   Pa, [facility]    06/02/17   Leandrew Koyanagi, MD Attending Physician Orthopedic Surgery  06/02/17     Chief Complaint  Patient presents with  . Annual Exam    Subjective:  Nathan [name] is a 72 y.o. male present for CPE. All past medical history, surgical history, allergies, family history, immunizations, medications and social history were updated in the electronic medical record today. All recent labs, ED visits and hospitalizations within the last year were reviewed.  Hypertension/atheroscolerosis of aorta: Pt reports intermittent compliance with amlodipine 10 mg daily and Lisinopril 20 mg daily. Patient denies chest pain, shortness of breath, dizziness or lower extremity edema.  He has refused statin medication. He does not take a daily baby aspirin.Barriers to controlled BP: He believes that medications for blood pressure will cause kidney damage and doe snot believe in statins.  BMP: 05/12/2017, GFR >60, creatinine 1.06 CBC: 06/28/2016 hemoglobin 11.6, hematocrit 33.7. Lipid: 02/2014- chol 311, HDL 82, ldl 207, tg 105 Diet: Avoid adding sodium Exercise: Has started to exercise routinely. Using stationary bike 6 times a week for approximately 20-30 minutes. RF: Noncompliance, hypertension, former smoker, fhx heart disease, BMI>25  Health maintenance:  Colonoscopy: last screen 2012, recommend follow up 10 Immunizations:  tdap 2014 UTD, influenza declines, PNA series declines, zostavax declines Infectious disease screening:Hep C declines. PSA: Followed by urology, last appt 6 mos ago.  Lab Results  Component Value Date   PSA 4.61 (H) 12/15/2015   PSA 5.77 (H) 03/03/2014   PSA 4.76 (H) 07/02/2006  , pt was counseled on prostate cancer screenings.    Assistive device: none Oxygen WJX:BJYN Patient has a Dental home. Hospitalizations/ED visits: reviewed  Depression screen [facility] 2/9 04/20/2018 06/02/2017 05/12/2017 08/24/2015 08/24/2015  Decreased Interest 0 0 0 2 2  Down, Depressed, Hopeless 0 0 0 2 2  PHQ - 2 Score 0 0 0 4 4  Altered sleeping 0 0 - 0 0  Tired, decreased energy 1 0 - 3 2  Change in appetite 0 0 - 2 1  Feeling bad or failure about yourself  0 0 - 2 2  Trouble concentrating 0 0 - 2 2  Moving slowly or fidgety/restless 0 0 - 0 0  Suicidal thoughts 0 0 - 1 0  PHQ-9 Score 1 0 - 14 11  Difficult doing work/chores Not difficult at all Not difficult at all - Very difficult Somewhat difficult   GAD 7 : Generalized Anxiety Score 08/24/2015  Nervous, Anxious, on Edge 2  Control/stop worrying 3  Worry too much - different things 3  Trouble relaxing 1  Restless 0  Easily annoyed or irritable 2  Afraid - awful might happen 0  Total GAD 7 Score 11  Anxiety Difficulty Somewhat difficult     Current Exercise Habits: The patient does not participate in regular exercise at present Exercise limited by: None identified Fall Risk  04/20/2018 06/02/2017 06/02/2017 08/24/2015 02/22/2013  Falls in the past year? No No No No No      Immunization History  Administered Date(s) Administered  . PPD Test 03/17/2013  . Tdap 07/15/2012  . Zoster 02/06/2015     Past Medical History:  Diagnosis Date  . Adrenal mass ([place]) 2018  Urology fully evaluated; benign  . Allergy    hay fever  . Anxiety   . Cataract   . Complication of anesthesia    hypoglycemia, low blood pressure, pt. reports it dropped to zero- post op MORPHINE , then taken ICU due to either the morphine or hypoglycemia . In addition post op from cataract surgery- 07/2015, experienced an episode of hypoglycemia    . Depressive disorder, not elsewhere classified   . Diverticulosis   . Elevated prostate specific antigen (PSA) 2008   Dr Jeffie Pollock  . Gallstone   . H/O  cardiovascular stress test >10 yrs.   told that its wnl. Told to drink less coffee  . Hematuria 2018   Hematuria with enlarged prostate, kidney cyst, adrenal adenoma--> followed by urology with cystoscopy and repeat CT, benign workup.  [name] Hx of migraines   . Hypertension   . Hypoglycemia   . IBS (irritable bowel syndrome)   . Inguinal hernia 12/2015   Bilateral small indirect hernias on CT, umbilical hernia also present.  . Kidney cyst, acquired 2018   Followed by urology, benign.  . Macular degeneration (senile) of retina, unspecified    2 different opinions from 2 different Ophth  . Osteoarthrosis, unspecified whether generalized or localized, unspecified site   . Other and unspecified hyperlipidemia   . Prostatitis 2008  . Refusal of blood transfusions as patient is Jehovah's Witness   . Spondylosis of lumbar spine   . 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   Allergies  Allergen Reactions  . Morphine And Related Other (See Comments)    Severe drop in blood pressure  . Citalopram Hydrobromide Other (See Comments)    PATIENT PREFERENCE "Just ineffective"  . Sertraline Hcl Other (See Comments)    REACTION: headache   Past Surgical History:  Procedure Laterality Date  . CATARACT EXTRACTION, BILATERAL    . COLONOSCOPY  2012   Dr Olevia Perches  . CYSTOSCOPY     for hematuria  . hydrocoelectomy  2003   Dr Jeffie Pollock  . KNEE ARTHROSCOPY Left 1994  . prostate nodule resection  2003  . TONSILLECTOMY    . TOTAL KNEE ARTHROPLASTY  2009  . TOTAL KNEE ARTHROPLASTY Right 06/27/2016   Procedure: TOTAL KNEE ARTHROPLASTY;  Surgeon: Leandrew Koyanagi, MD;  Location: [facility];  Service: Orthopedics;  Laterality: Right;   Family History  Problem Relation Age of Onset  . Lung cancer Father        smoker  . Alcohol abuse Father   . Tuberculosis Father   . Heart disease Father   . Early death Father   . Diabetes Other   . Heart disease Mother   . Stroke Neg Hx    Social  History   Socioeconomic History  . Marital status: Married    Spouse name: Not on file  . Number of children: Not on file  . Years of education: Not on file  . Highest education level: Not on file  Occupational History  . Not on file  Social Needs  . Financial resource strain: Not on file  . Food insecurity:    Worry: Not on file    Inability: Not on file  . Transportation needs:    Medical: Not on file    Non-medical: Not on file  Tobacco Use  . Smoking status: Former Smoker    Last attempt to quit: 07/15/1968    Years since quitting: 49.7  . Smokeless  tobacco: Never Used  . Tobacco comment: smoked [name], up to 1ppd  Substance and Sexual Activity  . Alcohol use: Yes    Alcohol/week: 2.0 - 3.0 standard drinks    Types: 2 - 3 Shots of liquor per week  . Drug use: No  . Sexual activity: Yes    Partners: Female    Birth control/protection: None  Lifestyle  . Physical activity:    Days per week: Not on file    Minutes per session: Not on file  . Stress: Not on file  Relationships  . Social connections:    Talks on phone: Not on file    Gets together: Not on file    Attends religious service: Not on file    Active member of club or organization: Not on file    Attends meetings of clubs or organizations: Not on file    Relationship status: Not on file  . Intimate partner violence:    Fear of current or ex partner: Not on file    Emotionally abused: Not on file    Physically abused: Not on file    Forced sexual activity: Not on file  Other Topics Concern  . Not on file  Social History Narrative   Married. Wife's name is [name]. Full time employed, Pensions consultant.   Drinks caffeinated beverages. Take a multivitamin. Wears his seatbelt.   Exercises at least 3 times a week. Is not a strict vegetarian, but only eats meat 1-2 times a week   Wears a hearing aid. Does not wear dentures or use an assistive walking device.   There is a smoke detector in his  home.   He feels safe in his relationships.   Allergies as of 04/20/2018      Reactions   Morphine And Related Other (See Comments)   Severe drop in blood pressure   Citalopram Hydrobromide Other (See Comments)   PATIENT PREFERENCE "Just ineffective"   Sertraline Hcl Other (See Comments)   REACTION: headache      Medication List        Accurate as of 04/20/18 10:30 AM. Always use your most recent med list.          amLODipine 10 MG tablet Commonly known as:  NORVASC Take 1 tablet (10 mg total) by mouth daily.   b complex vitamins tablet Take 1 tablet by mouth every other day.   carbamide peroxide 6.5 % OTIC solution Commonly known as:  DEBROX 2x daily until cerumen clears, then use PRN   IODINE (KELP) PO Take 1 tablet by mouth daily.   lisinopril 20 MG tablet Commonly known as:  PRINIVIL,ZESTRIL Take 1 tablet (20 mg total) by mouth daily. Needs office visit prior to any additional refills   OVER THE COUNTER MEDICATION Take 1 tablet by mouth daily. [place]  Immune System Supplement   sodium chloride 0.65 % Soln nasal spray Commonly known as:  OCEAN Place 1 spray into both nostrils 2 (two) times daily as needed for congestion.   vitamin C 500 MG tablet Commonly known as:  ASCORBIC ACID Take 500 mg by mouth 3 (three) times daily.      All past medical history, surgical history, allergies, family history, immunizations andmedications were updated in the EMR today and reviewed under the history and medication portions of their EMR.     No results found for this or any previous visit (from the past 2160 hour(s)).  No results found.  ROS: 14 pt review of systems performed and negative (unless mentioned in an HPI)  Objective: BP (!) 155/88 (BP Location: Right Arm, Patient Position: Sitting, Cuff Size: Large)   Pulse 67   Temp 98.2 F (36.8 C)   Resp 20   Ht 5\' 11"  (1.803 m)   Wt 199 lb 8 oz (90.5 kg)   SpO2 97%   BMI 27.82 kg/m  Gen: Afebrile. No acute  distress. Nontoxic in appearance, well-developed, well-nourished,  Male. overweight HENT: AT. [place]. Bilateral TM unable to be visualized 2/2 to bilateral cerumen impaction. MMM, no oral lesions, adequate dentition. Bilateral nares within normal limits. Throat without erythema, ulcerations or exudates. no Cough on exam, no hoarseness on exam. Eyes:Pupils Equal Round Reactive to light, Extraocular movements intact,  Conjunctiva without redness, discharge or icterus. Neck/lymp/endocrine: Supple,no lymphadenopathy, no thyromegaly CV: RRR no murmur, no edema, +2/4 P posterior tibialis pulses. no carotid bruits. No JVD. Chest: CTAB, no wheeze, rhonchi or crackles. normal Respiratory effort. good Air movement. Abd: Soft. flat. NTND. BS present. no Masses palpated. No hepatosplenomegaly. No rebound tenderness or guarding. Skin: no rashes, purpura or petechiae. Warm and well-perfused. Skin intact. Neuro/Msk:  Normal gait. PERLA. EOMi. Alert. Oriented x3.  Cranial nerves II through XII intact. Muscle strength 5/5 upper/lower extremity. DTRs equal bilaterally. Psych: Normal affect, dress and demeanor. Normal speech. Normal thought content and judgment.  No exam data present  Assessment/plan: LARY ECKARDT is a 72 y.o. male present for CPE Mixed hyperlipidemia/Atherosclerosis of aorta (HCC)/hypertension/Overweight (BMI 25.0-29.9) - Pressures were at goal when he took the medicine. He has been noncompliant last few weeks.Brother recently with massive MI ( 33 years older) has him considering the importance of  his medication regimen a little better. - referred him to American heart association web page to review - refills on amlodipine and lisinopril.  - Encourage fish oil supplementation.  - He does not take baby aspirin daily, secondary to easy bruising. - He has refused statin. - CBC, cmp, tsh. Lipids collected today - Follow-up 6 months  Elevated glucose - HgB A1c   Bilateral impacted cerumen -  carbamide peroxide (DEBROX) 6.5 % OTIC solution; 2x daily until cerumen clears, then use PRN  Dispense: 15 mL; Refill: 0  Encounter for preventive health examination Patient was encouraged to exercise greater than 150 minutes a week. Patient was encouraged to choose a diet filled with fresh fruits and vegetables, and lean meats. AVS provided to patient today for education/recommendation on gender specific health and safety maintenance. Colonoscopy: last screen 2012, recommend follow up 10 Immunizations:  tdap 2014 UTD, influenza declines, PNA series declines, zostavax declines Infectious disease screening:Hep C declines. PSA: Followed by urology, last appt 6 mos ago.   Return in about 1 year (around 04/21/2019) for CPE. 6 mos [place]  Note is dictated utilizing voice recognition software. Although note has been proof read prior to signing, occasional typographical errors still can be missed. If any questions arise, please do not hesitate to call for verification.  Electronically signed by: Nathan Pouch, DO [place]

## 2018-04-20 NOTE — Patient Outreach (Signed)
[facility]) Care Management  04/20/2018  [name] 1946/01/26 [id number]   Medication Adherence call to [name] Left a message for patient to call back patient is due on Lisinopril 20 mg.[name] is showing past due under [address].   [facility] Direct Dial [phone]  Fax [phone] [name]

## 2018-04-21 ENCOUNTER — Encounter: Payer: Self-pay | Admitting: Family Medicine

## 2018-04-21 ENCOUNTER — Telehealth: Payer: Self-pay | Admitting: Family Medicine

## 2018-04-21 LAB — CBC WITH DIFFERENTIAL/PLATELET
BASOS ABS: 23 {cells}/uL (ref 0–200)
Basophils Relative: 0.3 %
EOS ABS: 109 {cells}/uL (ref 15–500)
EOS PCT: 1.4 %
HCT: 43.9 % (ref 38.5–50.0)
HEMOGLOBIN: 14.8 g/dL (ref 13.2–17.1)
Lymphs Abs: 2379 cells/uL (ref 850–3900)
MCH: 29.8 pg (ref 27.0–33.0)
MCHC: 33.7 g/dL (ref 32.0–36.0)
MCV: 88.3 fL (ref 80.0–100.0)
MONOS PCT: 8.3 %
MPV: 10.5 fL (ref 7.5–12.5)
NEUTROS ABS: 4641 {cells}/uL (ref 1500–7800)
Neutrophils Relative %: 59.5 %
PLATELETS: 282 10*3/uL (ref 140–400)
RBC: 4.97 10*6/uL (ref 4.20–5.80)
RDW: 13.5 % (ref 11.0–15.0)
TOTAL LYMPHOCYTE: 30.5 %
WBC mixed population: 647 cells/uL (ref 200–950)
WBC: 7.8 10*3/uL (ref 3.8–10.8)

## 2018-04-21 LAB — HEMOGLOBIN A1C
EAG (MMOL/L): 5.8 (calc)
Hgb A1c MFr Bld: 5.3 % of total Hgb (ref <5.7)
MEAN PLASMA GLUCOSE: 105 (calc)

## 2018-04-21 LAB — COMPREHENSIVE METABOLIC PANEL
AG Ratio: 1.6 (calc) (ref 1.0–2.5)
ALT: 15 U/L (ref 9–46)
AST: 19 U/L (ref 10–35)
Albumin: 3.9 g/dL (ref 3.6–5.1)
Alkaline phosphatase (APISO): 52 U/L (ref 40–115)
BUN: 13 mg/dL (ref 7–25)
CO2: 24 mmol/L (ref 20–32)
CREATININE: 1.06 mg/dL (ref 0.70–1.18)
Calcium: 9.1 mg/dL (ref 8.6–10.3)
Chloride: 104 mmol/L (ref 98–110)
GLUCOSE: 85 mg/dL (ref 65–99)
Globulin: 2.4 g/dL (calc) (ref 1.9–3.7)
Potassium: 4 mmol/L (ref 3.5–5.3)
SODIUM: 138 mmol/L (ref 135–146)
TOTAL PROTEIN: 6.3 g/dL (ref 6.1–8.1)
Total Bilirubin: 0.7 mg/dL (ref 0.2–1.2)

## 2018-04-21 LAB — LIPID PANEL
CHOLESTEROL: 290 mg/dL — AB (ref <200)
HDL: 65 mg/dL (ref >40)
LDL CHOLESTEROL (CALC): 199 mg/dL — AB
Non-HDL Cholesterol (Calc): 225 mg/dL (calc) — ABNORMAL HIGH (ref <130)
TRIGLYCERIDES: 120 mg/dL (ref <150)
Total CHOL/HDL Ratio: 4.5 (calc) (ref <5.0)

## 2018-04-21 NOTE — Telephone Encounter (Signed)
Of note: please be sensitive to the fact his brother recently had a massive MI and passed away. He has been resistant to BP meds and declined cholesterol meds in the past.   Please inform patient the following information: His labs are all normal with the exception of his cholesterol is rather high. With his specific numbers, medical history and known risk factors his cardiac risk score if ~34% chance of heart disease/MI or stroke in the next 10 years.  His LDL (also known as the bad cholesterol) is > 190 and his total is almost 300. A statin medication is recommended by the american heart association to help lower his cholesterol and provide him with added cardiovascular protection from an event. He can decrease his risk with good control of his blood pressure, diet low in sodium and saturated fats, higher fiber and exercise and a statin.   If he is agreeable, I will call in a statin for him to start--> He will then need a f/u with provider in 3 months for recheck.

## 2018-04-21 NOTE — Telephone Encounter (Signed)
Left detailed message with results and instructions on patient voice mail per DPR instructed patient to call back and let us know if he is willing to start a statin medication.

## 2018-04-23 ENCOUNTER — Ambulatory Visit: Payer: Self-pay | Admitting: Physician Assistant

## 2018-04-24 ENCOUNTER — Other Ambulatory Visit: Payer: Self-pay

## 2018-04-24 ENCOUNTER — Ambulatory Visit (INDEPENDENT_AMBULATORY_CARE_PROVIDER_SITE_OTHER): Payer: Medicare Other | Admitting: Family Medicine

## 2018-04-24 ENCOUNTER — Ambulatory Visit: Payer: Medicare Other | Admitting: Family Medicine

## 2018-04-24 ENCOUNTER — Encounter: Payer: Self-pay | Admitting: Family Medicine

## 2018-04-24 VITALS — BP 122/82 | HR 70 | Temp 97.8°F | Ht 71.0 in | Wt 198.4 lb

## 2018-04-24 DIAGNOSIS — H6123 Impacted cerumen, bilateral: Secondary | ICD-10-CM | POA: Diagnosis not present

## 2018-04-24 NOTE — Progress Notes (Signed)
Subjective  CC:  Chief Complaint  Patient presents with  . Ear Problem    patient states that his ears are clogged, used ear drops with no help     HPI: Nathan [name] is a 72 y.o. male who presents to the office today to address the problems listed above in the chief complaint. Same day acute visit; PCP not available. New pt to me. Chart reviewed.    72 year old male patient of Dr. Raoul Pitch here for bilateral cerumen impaction.  He reports he was seen last week for physical.  Patient was given earwax softener at that visit however symptoms have worsened.  Now cannot hear out of the left.  He denies pain.  No fevers or chills or illness.  I reviewed the last office note Assessment  1. Bilateral impacted cerumen      Plan   Bilateral cerumen impaction without complications: Bilateral ear lavage  Follow up: Return if symptoms worsen or fail to improve.   No orders of the defined types were placed in this encounter.  No orders of the defined types were placed in this encounter.     I reviewed the patients updated PMH, FH, and SocHx.    Patient Active Problem List   Diagnosis Date Noted  . Overweight (BMI 25.0-29.9) 04/20/2018  . Atherosclerosis of aorta ([place]) 12/18/2015  . Hearing aid worn 08/24/2015  . Essential hypertension, benign 08/24/2015  . Hyperlipidemia 06/29/2007   Current Meds  Medication Sig  . amLODipine (NORVASC) 10 MG tablet Take 1 tablet (10 mg total) by mouth daily.  [name] b complex vitamins tablet Take 1 tablet by mouth every other day.  . carbamide peroxide (DEBROX) 6.5 % OTIC solution 2x daily until cerumen clears, then use PRN  . IODINE, KELP, PO Take 1 tablet by mouth daily.  [name] lisinopril (PRINIVIL,ZESTRIL) 20 MG tablet Take 1 tablet (20 mg total) by mouth daily. Needs office visit prior to any additional refills  . OVER THE COUNTER MEDICATION Take 1 tablet by mouth daily. [place]  Immune System Supplement  . sodium chloride (OCEAN) 0.65 % SOLN nasal  spray Place 1 spray into both nostrils 2 (two) times daily as needed for congestion.  . vitamin C (ASCORBIC ACID) 500 MG tablet Take 500 mg by mouth 3 (three) times daily.    Allergies: Patient is allergic to morphine and related; citalopram hydrobromide; and sertraline hcl. Family History: Patient family history includes Alcohol abuse in his father; Diabetes in his other; Early death in his father; Heart disease in his father and mother; Lung cancer in his father; Tuberculosis in his father. Social History:  Patient  reports that he quit smoking about 49 years ago. He has never used smokeless tobacco. He reports that he drinks about 2.0 - 3.0 standard drinks of alcohol per week. He reports that he does not use drugs.  Review of Systems: Constitutional: Negative for fever malaise or anorexia Cardiovascular: negative for chest pain Respiratory: negative for SOB or persistent cough Gastrointestinal: negative for abdominal pain  Objective  Vitals: BP 122/82   Pulse 70   Temp 97.8 F (36.6 C)   Ht 5\' 11"  (1.803 m)   Wt 198 lb 6.4 oz (90 kg)   SpO2 98%   BMI 27.67 kg/m  General: no acute distress , A&Ox3 HEENT: [place], conjunctiva normal, Oropharynx moist,neck is supple, initial exam: bilateral cerumen impaction F/u exam after lavage: nl TMs bilaterally  PROCEDURE: CERUMEN DISIMPACTION    The patient had  a large amount of cerumen in the external auditory canal(s): bilateral  Ear wax softener was used prior to the lavage.  Ear lavage was performed on bilateral ear(s) by Amy peterman, LPM  Curettage by provider was not performed in addition.   There were no complications and following the disimpaction the bilateral TMs were visible and normal.      Commons side effects, risks, benefits, and alternatives for medications and treatment plan prescribed today were discussed, and the patient expressed understanding of the given instructions. Patient is instructed to call or message  via MyChart if he/she has any questions or concerns regarding our treatment plan. No barriers to understanding were identified. We discussed Red Flag symptoms and signs in detail. Patient expressed understanding regarding what to do in case of urgent or emergency type symptoms.   Medication list was reconciled, printed and provided to the patient in AVS. Patient instructions and summary information was reviewed with the patient as documented in the AVS. This note was prepared with assistance of Dragon voice recognition software. Occasional wrong-word or sound-a-like substitutions may have occurred due to the inherent limitations of voice recognition software

## 2018-04-24 NOTE — Patient Instructions (Signed)
Please follow up if symptoms do not improve or as needed.   

## 2018-06-08 ENCOUNTER — Ambulatory Visit: Payer: Self-pay

## 2018-09-30 ENCOUNTER — Emergency Department (HOSPITAL_COMMUNITY)
Admission: EM | Admit: 2018-09-30 | Discharge: 2018-10-01 | Disposition: A | Discharge status: Home / Self Care | Payer: Medicare Other | Attending: Emergency Medicine | Admitting: Emergency Medicine

## 2018-09-30 ENCOUNTER — Other Ambulatory Visit: Payer: Self-pay

## 2018-09-30 ENCOUNTER — Emergency Department (HOSPITAL_COMMUNITY): Payer: Medicare Other

## 2018-09-30 ENCOUNTER — Encounter (HOSPITAL_COMMUNITY): Payer: Self-pay | Admitting: Emergency Medicine

## 2018-09-30 DIAGNOSIS — R1110 Vomiting, unspecified: Secondary | ICD-10-CM | POA: Diagnosis not present

## 2018-09-30 DIAGNOSIS — R110 Nausea: Secondary | ICD-10-CM

## 2018-09-30 DIAGNOSIS — R55 Syncope and collapse: Secondary | ICD-10-CM | POA: Insufficient documentation

## 2018-09-30 DIAGNOSIS — R109 Unspecified abdominal pain: Secondary | ICD-10-CM | POA: Insufficient documentation

## 2018-09-30 DIAGNOSIS — R42 Dizziness and giddiness: Secondary | ICD-10-CM | POA: Diagnosis not present

## 2018-09-30 DIAGNOSIS — Z87891 Personal history of nicotine dependence: Secondary | ICD-10-CM | POA: Diagnosis not present

## 2018-09-30 DIAGNOSIS — R1111 Vomiting without nausea: Secondary | ICD-10-CM | POA: Diagnosis not present

## 2018-09-30 DIAGNOSIS — I10 Essential (primary) hypertension: Secondary | ICD-10-CM | POA: Diagnosis not present

## 2018-09-30 DIAGNOSIS — Z96651 Presence of right artificial knee joint: Secondary | ICD-10-CM | POA: Diagnosis not present

## 2018-09-30 DIAGNOSIS — R112 Nausea with vomiting, unspecified: Secondary | ICD-10-CM | POA: Diagnosis not present

## 2018-09-30 DIAGNOSIS — Z79899 Other long term (current) drug therapy: Secondary | ICD-10-CM | POA: Diagnosis not present

## 2018-09-30 LAB — COMPREHENSIVE METABOLIC PANEL
ALT: 19 U/L (ref 0–44)
AST: 22 U/L (ref 15–41)
Albumin: 4 g/dL (ref 3.5–5.0)
Alkaline Phosphatase: 49 U/L (ref 38–126)
Anion gap: 8 (ref 5–15)
BUN: 21 mg/dL (ref 8–23)
CO2: 23 mmol/L (ref 22–32)
Calcium: 9 mg/dL (ref 8.9–10.3)
Chloride: 108 mmol/L (ref 98–111)
Creatinine, Ser: 1.25 mg/dL — ABNORMAL HIGH (ref 0.61–1.24)
GFR calc Af Amer: >60 mL/min (ref >60)
GFR calc non Af Amer: 57 mL/min — ABNORMAL LOW (ref >60)
Glucose, Bld: 119 mg/dL — ABNORMAL HIGH (ref 70–99)
Potassium: 3.3 mmol/L — ABNORMAL LOW (ref 3.5–5.1)
Sodium: 139 mmol/L (ref 135–145)
Total Bilirubin: 0.7 mg/dL (ref 0.3–1.2)
Total Protein: 6.8 g/dL (ref 6.5–8.1)

## 2018-09-30 LAB — CBC WITH DIFFERENTIAL/PLATELET
Abs Immature Granulocytes: 0.08 10*3/uL — ABNORMAL HIGH (ref 0.00–0.07)
Basophils Absolute: 0 10*3/uL (ref 0.0–0.1)
Basophils Relative: 0 %
Eosinophils Absolute: 0.1 10*3/uL (ref 0.0–0.5)
Eosinophils Relative: 0 %
HCT: 43.3 % (ref 39.0–52.0)
Hemoglobin: 14.7 g/dL (ref 13.0–17.0)
Immature Granulocytes: 1 %
Lymphocytes Relative: 10 %
Lymphs Abs: 1.7 10*3/uL (ref 0.7–4.0)
MCH: 30.2 pg (ref 26.0–34.0)
MCHC: 33.9 g/dL (ref 30.0–36.0)
MCV: 88.9 fL (ref 80.0–100.0)
Monocytes Absolute: 0.9 10*3/uL (ref 0.1–1.0)
Monocytes Relative: 5 %
NRBC: 0 % (ref 0.0–0.2)
Neutro Abs: 14.2 10*3/uL — ABNORMAL HIGH (ref 1.7–7.7)
Neutrophils Relative %: 84 %
Platelets: 291 10*3/uL (ref 150–400)
RBC: 4.87 MIL/uL (ref 4.22–5.81)
RDW: 14.1 % (ref 11.5–15.5)
WBC: 16.9 10*3/uL — ABNORMAL HIGH (ref 4.0–10.5)

## 2018-09-30 LAB — CBG MONITORING, ED: Glucose-Capillary: 102 mg/dL — ABNORMAL HIGH (ref 70–99)

## 2018-09-30 LAB — I-STAT TROPONIN, ED: TROPONIN I, POC: 0.01 ng/mL (ref 0.00–0.08)

## 2018-09-30 MED ORDER — SODIUM CHLORIDE 0.9 % IV BOLUS
500.0000 mL | Freq: Once | INTRAVENOUS | Status: AC
  Administered 2018-09-30: 500 mL via INTRAVENOUS

## 2018-09-30 MED ORDER — IOHEXOL 300 MG/ML  SOLN
100.0000 mL | Freq: Once | INTRAMUSCULAR | Status: AC | PRN
  Administered 2018-09-30: 100 mL via INTRAVENOUS

## 2018-09-30 MED ORDER — SODIUM CHLORIDE 0.9 % IV BOLUS
1000.0000 mL | Freq: Once | INTRAVENOUS | Status: AC
  Administered 2018-09-30: 1000 mL via INTRAVENOUS

## 2018-09-30 MED ORDER — ONDANSETRON HCL 4 MG/2ML IJ SOLN
4.0000 mg | Freq: Once | INTRAMUSCULAR | Status: AC
  Administered 2018-09-30: 4 mg via INTRAVENOUS
  Filled 2018-09-30: qty 2

## 2018-09-30 MED ORDER — POTASSIUM CHLORIDE 10 MEQ/100ML IV SOLN
10.0000 meq | Freq: Once | INTRAVENOUS | Status: AC
  Administered 2018-09-30: 10 meq via INTRAVENOUS
  Filled 2018-09-30: qty 100

## 2018-09-30 NOTE — ED Triage Notes (Signed)
Pt coming by EMS after having syncopal episode while at church. Pt went unconsciousness for a few minutes and members of church lowered him down to the floor from the chair. Upon arrival EMS noted pt to be alert and oriented, and actively vomiting. Gave 4mg  IV Zofran. Per EMS possible ST elevation noted in lead II. Vitals WDL.

## 2018-09-30 NOTE — ED Provider Notes (Signed)
[facility] EMERGENCY DEPARTMENT Provider Note   CSN: [id number] Arrival date & time: 09/30/18  2159    History   Chief Complaint Chief Complaint  Patient presents with  . Loss of Consciousness    HPI Nathan [name] is a 73 y.o. male.     73 year old male with prior medical history as detailed below presents for evaluation following reported syncopal event.  Patient was leading a virtual meeting at discharge.  He had sudden onset of nausea followed by emesis.  He apparently syncopized in the middle of his emesis.  He denies associated chest pain or shortness of breath.  He denies fever.  He denies other complaint.  He reports he feels significantly better now.  The history is provided by the patient and medical records.  Loss of Consciousness  Episode history:  Single Most recent episode:  Today Timing:  Rare Progression:  Resolved Chronicity:  New Context comment:  Nausea and vomiting Witnessed: yes   Relieved by:  Nothing Worsened by:  Nothing Ineffective treatments:  None tried Associated symptoms: nausea and vomiting     Past Medical History:  Diagnosis Date  . Adrenal mass ([place]) 2018   Urology fully evaluated; benign  . Allergy    hay fever  . Anxiety   . Cataract   . Complication of anesthesia    hypoglycemia, low blood pressure, pt. reports it dropped to zero- post op MORPHINE , then taken ICU due to either the morphine or hypoglycemia . In addition post op from cataract surgery- 07/2015, experienced an episode of hypoglycemia    . Depressive disorder, not elsewhere classified   . Diverticulosis   . Elevated prostate specific antigen (PSA) 2008   Dr Jeffie Pollock  . Gallstone   . H/O cardiovascular stress test >10 yrs.   told that its wnl. Told to drink less coffee  . Hematuria 2018   Hematuria with enlarged prostate, kidney cyst, adrenal adenoma--> followed by urology with cystoscopy and repeat CT, benign workup.  [name] Hx of migraines   .  Hypertension   . Hypoglycemia   . IBS (irritable bowel syndrome)   . Inguinal hernia 12/2015   Bilateral small indirect hernias on CT, umbilical hernia also present.  . Kidney cyst, acquired 2018   Followed by urology, benign.  . Macular degeneration (senile) of retina, unspecified    2 different opinions from 2 different Ophth  . Osteoarthrosis, unspecified whether generalized or localized, unspecified site   . Other and unspecified hyperlipidemia   . Prostatitis 2008  . Refusal of blood transfusions as patient is Jehovah's Witness   . Spondylosis of lumbar spine   . 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    Patient Active Problem List   Diagnosis Date Noted  . Overweight (BMI 25.0-29.9) 04/20/2018  . Atherosclerosis of aorta ([place]) 12/18/2015  . Hearing aid worn 08/24/2015  . Essential hypertension, benign 08/24/2015  . Hyperlipidemia 06/29/2007    Past Surgical History:  Procedure Laterality Date  . CATARACT EXTRACTION, BILATERAL    . COLONOSCOPY  2012   Dr Olevia Perches  . CYSTOSCOPY     for hematuria  . hydrocoelectomy  2003   Dr Jeffie Pollock  . KNEE ARTHROSCOPY Left 1994  . prostate nodule resection  2003  . TONSILLECTOMY    . TOTAL KNEE ARTHROPLASTY  2009  . TOTAL KNEE ARTHROPLASTY Right 06/27/2016   Procedure: TOTAL KNEE ARTHROPLASTY;  Surgeon: Leandrew Koyanagi, MD;  Location: [facility];  Service: Orthopedics;  Laterality: Right;        Home Medications    Prior to Admission medications   Medication Sig Start Date End Date Taking? Authorizing Provider  amLODipine (NORVASC) 10 MG tablet Take 1 tablet (10 mg total) by mouth daily. 04/20/18   Kuneff, Renee A, DO  b complex vitamins tablet Take 1 tablet by mouth every other day.    [provider]  carbamide peroxide (DEBROX) 6.5 % OTIC solution 2x daily until cerumen clears, then use PRN 04/20/18   Kuneff, Renee A, DO  IODINE, KELP, PO Take 1 tablet by mouth daily.    [provider]   lisinopril (PRINIVIL,ZESTRIL) 20 MG tablet Take 1 tablet (20 mg total) by mouth daily. Needs office visit prior to any additional refills 04/20/18   Kuneff, Renee A, DO  OVER THE COUNTER MEDICATION Take 1 tablet by mouth daily. [place]  Immune System Supplement    [provider]  sodium chloride (OCEAN) 0.65 % SOLN nasal spray Place 1 spray into both nostrils 2 (two) times daily as needed for congestion.    [provider]  vitamin C (ASCORBIC ACID) 500 MG tablet Take 500 mg by mouth 3 (three) times daily.    [provider]    Family History Family History  Problem Relation Age of Onset  . Lung cancer Father        smoker  . Alcohol abuse Father   . Tuberculosis Father   . Heart disease Father   . Early death Father   . Diabetes Other   . Heart disease Mother   . Stroke Neg Hx     Social History Social History   Tobacco Use  . Smoking status: Former Smoker    Last attempt to quit: 07/15/1968    Years since quitting: 50.2  . Smokeless tobacco: Never Used  . Tobacco comment: smoked [place], up to 1ppd  Substance Use Topics  . Alcohol use: Yes    Alcohol/week: 2.0 - 3.0 standard drinks    Types: 2 - 3 Shots of liquor per week  . Drug use: No     Allergies   Morphine and related; Citalopram hydrobromide; and Sertraline hcl   Review of Systems Review of Systems  Cardiovascular: Positive for syncope.  Gastrointestinal: Positive for nausea and vomiting.  Neurological: Positive for syncope.  All other systems reviewed and are negative.    Physical Exam Updated Vital Signs BP (!) 160/87   Pulse 76   Temp 98.2 F (36.8 C) (Oral)   Ht 5\' 11"  (1.803 m)   Wt 89.8 kg   SpO2 96%   BMI 27.62 kg/m   Physical Exam Vitals signs and nursing note reviewed.  Constitutional:      General: He is not in acute distress.    Appearance: Normal appearance. He is well-developed.  HENT:     Head: Normocephalic and atraumatic.  Eyes:      Conjunctiva/sclera: Conjunctivae normal.     Pupils: Pupils are equal, round, and reactive to light.  Neck:     Musculoskeletal: Normal range of motion and neck supple.  Cardiovascular:     Rate and Rhythm: Normal rate and regular rhythm.     Heart sounds: Normal heart sounds.  Pulmonary:     Effort: Pulmonary effort is normal. No respiratory distress.     Breath sounds: Normal breath sounds.  Abdominal:     General: There is no  distension.     Palpations: Abdomen is soft.     Tenderness: There is no abdominal tenderness.  Musculoskeletal: Normal range of motion.        General: No deformity.  Skin:    General: Skin is warm and dry.  Neurological:     General: No focal deficit present.     Mental Status: He is alert and oriented to person, place, and time. Mental status is at baseline.      ED Treatments / Results  Labs (all labs ordered are listed, but only abnormal results are displayed) Labs Reviewed  COMPREHENSIVE METABOLIC PANEL - Abnormal; Notable for the following components:      Result Value   Potassium 3.3 (*)    Glucose, Bld 119 (*)    Creatinine, Ser 1.25 (*)    GFR calc non Af Amer 57 (*)    All other components within normal limits  CBC WITH DIFFERENTIAL/PLATELET - Abnormal; Notable for the following components:   WBC 16.9 (*)    Neutro Abs 14.2 (*)    Abs Immature Granulocytes 0.08 (*)    All other components within normal limits  CBG MONITORING, ED - Abnormal; Notable for the following components:   Glucose-Capillary 102 (*)    All other components within normal limits  I-STAT TROPONIN, ED    EKG EKG Interpretation  Date/Time:  Wednesday September 30 2018 22:02:33 EDT Ventricular Rate:  75 PR Interval:    QRS Duration: 111 QT Interval:  406 QTC Calculation: 454 R Axis:   18 Text Interpretation:  Sinus rhythm Borderline repolarization abnormality Baseline wander in lead(s) II Confirmed by Dene Gentry [phone]) on 09/30/2018 10:08:59 PM    Radiology Dg Chest Port 1 View  Result Date: 09/30/2018 CLINICAL DATA:  Syncope EXAM: PORTABLE CHEST 1 VIEW COMPARISON:  05/18/2013 FINDINGS: Heart and mediastinal contours are within normal limits. No focal opacities or effusions. No acute bony abnormality. IMPRESSION: No active disease. Electronically Signed   By: Rolm Baptise M.D.   On: 09/30/2018 22:21    Procedures Procedures (including critical care time)  Medications Ordered in ED Medications  sodium chloride 0.9 % bolus 500 mL (500 mLs Intravenous New Bag/Given 09/30/18 2229)  ondansetron (ZOFRAN) injection 4 mg (4 mg Intravenous Given 09/30/18 2228)     Initial Impression / Assessment and Plan / ED Course  I have reviewed the triage vital signs and the nursing notes.  Pertinent labs & imaging results that were available during my care of the patient were reviewed by me and considered in my medical decision making (see chart for details).        MDM  Screen complete  Patient presenting with syncope and associated nausea/vomiting.   Patient's primary complaint is of nausea and vomiting. Syncopal episode most likely secondary to vasovagal response.   Initial labs are remarkable for elevated white count.  Patient does have persistent mild lower abdominal discomfort.  EKG is not suggestive of ACS. Initial troponin is negative.   CT does not demonstrate significant intra-abdominal abnormality.  Patient does feel improved.  He was offered admission.  He declined same.  Discharge is pending completion of IV fluid bolus and potassium replacement.  Dr. Myrene Buddy aware of pending re-eval and dispo.      Final Clinical Impressions(s) / ED Diagnoses   Final diagnoses:  Syncope and collapse  Nausea    ED Discharge Orders         Ordered    ondansetron (ZOFRAN) 4 MG  tablet  Every 6 hours     10/01/18 0003           Valarie Merino, MD 10/01/18 0005

## 2018-10-01 ENCOUNTER — Ambulatory Visit: Payer: Self-pay | Admitting: Family Medicine

## 2018-10-01 MED ORDER — HYDROMORPHONE HCL 1 MG/ML IJ SOLN
0.2500 mg | Freq: Once | INTRAMUSCULAR | Status: AC
  Administered 2018-10-01: 0.25 mg via INTRAVENOUS
  Filled 2018-10-01: qty 1

## 2018-10-01 MED ORDER — ONDANSETRON HCL 4 MG PO TABS: 4.0000 mg | ORAL_TABLET | Freq: Four times a day (QID) | ORAL | 0 refills | Status: DC

## 2018-10-01 NOTE — Discharge Instructions (Signed)
Return for any problem.  Follow-up with your regular care provider as instructed. °

## 2018-10-01 NOTE — Telephone Encounter (Signed)
Wife, Kamar Callender called in for husband.   He is sleeping. He collapsed at church last night and vomited once there.   He went to the [facility] ED.   Per wife they didn't find anything wrong with him.   "They did all kinds of blood work and CT scan of his abd".   "They didn't tell me any specific diagnoses".   Today he has a fever of 101.6 and is very tired. See triage notes.  I spoke with Diane at Dr. Lucita Lora office because the protocol is for him to be seen within 4 hours.   There were no appts available this evening.   He was instructed to go to the urgent care this evening which pt and wife agreed to go.  They are going to the urgent care near them in [place]. I let them know that he has an appt with Dr. Raoul Pitch at 1:30 10/02/2018 to follow up.   Arrive at 1:15.   The wife verbalized understanding.     I let the wife know to go ahead and go on to the urgent care but to keep this follow up appt with Dr. Raoul Pitch.  I sent these notes to Dr. Lucita Lora office.  Reason for Disposition . [1] Fever > 101 F (38.3 C) AND [2] age > 82  Answer Assessment - Initial Assessment Questions 1. TEMPERATURE: "What is the most recent temperature?"  "How was it measured?"      101.6 fever.   No Tylenol.      No coughing or shortness of breath. 2. ONSET: "When did the fever start?"      101.3 now.   Was 101.6 1hr ago.    Was in the [facility] ED last night.   He collapsed and vomited before going to ED.   He cooked some expired eggs yesterday and bacon.   Wife thinks it's food related.   Food poisoning possible.    3. SYMPTOMS: "Do you have any other symptoms besides the fever?"  (e.g., colds, headache, sore throat, earache, cough, rash, diarrhea, vomiting, abdominal pain)     Sleeping for most of the day.   He is drinking fluids.  Weak.  "I'm not alarmed" per wife.      He vomited once at church before collapsing.    4. CAUSE: If there are no symptoms, ask: "What do you think is causing the fever?"      Food poisoning from  expired eggs and bacon he cooked yesterday.   5. CONTACTS: "Does anyone else in the family have an infection?"     No 6. TREATMENT: "What have you done so far to treat this fever?" (e.g., medications)     Nothing 7. IMMUNOCOMPROMISE: "Do you have of the following: diabetes, HIV positive, splenectomy, cancer chemotherapy, chronic steroid treatment, transplant patient, etc."     Hypertension.    8. PREGNANCY: "Is there any chance you are pregnant?" "When was your last menstrual period?"     N/A 9. TRAVEL: "Have you traveled out of the country in the last month?" (e.g., travel history, exposures)     No exposures.  Protocols used: FEVER-A-AH

## 2018-10-01 NOTE — ED Provider Notes (Addendum)
Patient feeling much better. He is able to tolerate PO, no longer nauseous or lightheaded, and is ambulatory in ED without difficulty. Per plan with Dr. Francia Greaves, pt safe for d/c with encouraged fluids, outpatient follow-up. Suspect vagal syncope in setting of enteritis w/ vomiting. No ectopy, arrhythmia, or ischemia on telemetry or EKG. Return precautions given.   Duffy Bruce, MD 10/01/18 [place]    Duffy Bruce, MD 10/01/18 [phone]

## 2018-10-02 ENCOUNTER — Ambulatory Visit (INDEPENDENT_AMBULATORY_CARE_PROVIDER_SITE_OTHER): Payer: Medicare Other | Admitting: Family Medicine

## 2018-10-02 ENCOUNTER — Encounter: Payer: Self-pay | Admitting: Family Medicine

## 2018-10-02 ENCOUNTER — Other Ambulatory Visit: Payer: Self-pay

## 2018-10-02 VITALS — BP 132/80 | HR 66 | Temp 98.4°F | Resp 16 | Ht 71.0 in | Wt 200.4 lb

## 2018-10-02 DIAGNOSIS — K529 Noninfective gastroenteritis and colitis, unspecified: Secondary | ICD-10-CM | POA: Diagnosis not present

## 2018-10-02 NOTE — Patient Instructions (Signed)
Restart your blood pressure medication in a 1-2 days as your diet returns to normal.  Slowly return to normal diet.   I believe this was a gastric infection.

## 2018-10-02 NOTE — Progress Notes (Signed)
Nathan [name] , 1945-10-26, 73 y.o., male MRN: [id number] Patient Care Team    Relationship Specialty Notifications Start End  Ma Hillock, DO PCP - General Family Medicine  12/07/15   Pa, [facility]    06/02/17   Leandrew Koyanagi, MD Attending Physician Orthopedic Surgery  06/02/17     Chief Complaint  Patient presents with  . Fatigue    Pt was at church Wednesday night and passed out and then vomitted. EMS took patient to hospital and then he D/C same night. Thursday pt ran fever and slept all day, drank plenty of fluids. Went to Urgent care last night but was not seen due to urgent care only being able to run flu test. No fever today, just fatigue. Pt has not had BP medications since Wednesday.      Subjective: Pt presents for an OV after having a syncopal episode on Wednesday and was seen at the emergency room on Wednesday and then attempted to be seen at an urgent care on Thursday secondary to fever.  Reports when he passed out it was after he ate bacon that he thinks was spoiled.  When at church he became nauseated and lightheaded.  ED completed EKG, CT and labs on and diagnosis was suspect vasovagal syncope and second of enteritis and vomiting.  CT was positive for mildly prominent fluid filled small bowel with no wall thickening or inflammation no evidence of bowel obstructions findings consistent with gastroenteritis.  Labs indicated mild hypokalemia, mild AKI and an elevated white count of 16.9.  Was provided with IV fluid bolus and potassium replacement and was discharged home.  He was offered inpatient and declined.  He was febrile the next day with a temperature of 101 F.  Reports he slept most of Thursday.  He states he has not had a fever since Thursday.  He is starting to drink fluids and eat minimal food.  He denies any abdominal pain, current fever, chills, nausea, vomit or bowel changes.  IMPRESSION: 1. Mildly prominent fluid-filled small bowel with no wall  thickening or inflammation. No evidence of bowel obstruction. Findings consistent with gastroenteritis. 2. No other evidence of an acute abnormality. 3. Stable right adrenal adenoma. 4. Left colon diverticulosis.  No diverticulitis. 5. Aortic atherosclerosis. 6. Prostatic hypertrophy.  Depression screen [facility] 2/9 04/20/2018 06/02/2017 05/12/2017 08/24/2015 08/24/2015  Decreased Interest 0 0 0 2 2  Down, Depressed, Hopeless 0 0 0 2 2  PHQ - 2 Score 0 0 0 4 4  Altered sleeping 0 0 - 0 0  Tired, decreased energy 1 0 - 3 2  Change in appetite 0 0 - 2 1  Feeling bad or failure about yourself  0 0 - 2 2  Trouble concentrating 0 0 - 2 2  Moving slowly or fidgety/restless 0 0 - 0 0  Suicidal thoughts 0 0 - 1 0  PHQ-9 Score 1 0 - 14 11  Difficult doing work/chores Not difficult at all Not difficult at all - Very difficult Somewhat difficult    Allergies  Allergen Reactions  . Morphine And Related Other (See Comments)    Severe drop in blood pressure  . Citalopram Hydrobromide Other (See Comments)    PATIENT PREFERENCE "Just ineffective"  . Sertraline Hcl Other (See Comments)    REACTION: headache   Social History   Social History Narrative   Married. Wife's name is [name]. Full time employed, Pensions consultant.   Drinks caffeinated beverages.  Take a multivitamin. Wears his seatbelt.   Exercises at least 3 times a week. Is not a strict vegetarian, but only eats meat 1-2 times a week   Wears a hearing aid. Does not wear dentures or use an assistive walking device.   There is a smoke detector in his home.   He feels safe in his relationships.   Past Medical History:  Diagnosis Date  . Adrenal mass ([place]) 2018   Urology fully evaluated; benign  . Allergy    hay fever  . Anxiety   . Cataract   . Complication of anesthesia    hypoglycemia, low blood pressure, pt. reports it dropped to zero- post op MORPHINE , then taken ICU due to either the morphine or hypoglycemia . In  addition post op from cataract surgery- 07/2015, experienced an episode of hypoglycemia    . Depressive disorder, not elsewhere classified   . Diverticulosis   . Elevated prostate specific antigen (PSA) 2008   Dr Jeffie Pollock  . Gallstone   . H/O cardiovascular stress test >10 yrs.   told that its wnl. Told to drink less coffee  . Hematuria 2018   Hematuria with enlarged prostate, kidney cyst, adrenal adenoma--> followed by urology with cystoscopy and repeat CT, benign workup.  [name] Hx of migraines   . Hypertension   . Hypoglycemia   . IBS (irritable bowel syndrome)   . Inguinal hernia 12/2015   Bilateral small indirect hernias on CT, umbilical hernia also present.  . Kidney cyst, acquired 2018   Followed by urology, benign.  . Macular degeneration (senile) of retina, unspecified    2 different opinions from 2 different Ophth  . Osteoarthrosis, unspecified whether generalized or localized, unspecified site   . Other and unspecified hyperlipidemia   . Prostatitis 2008  . Refusal of blood transfusions as patient is Jehovah's Witness   . Spondylosis of lumbar spine   . 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   Past Surgical History:  Procedure Laterality Date  . CATARACT EXTRACTION, BILATERAL    . COLONOSCOPY  2012   Dr Olevia Perches  . CYSTOSCOPY     for hematuria  . hydrocoelectomy  2003   Dr Jeffie Pollock  . KNEE ARTHROSCOPY Left 1994  . prostate nodule resection  2003  . TONSILLECTOMY    . TOTAL KNEE ARTHROPLASTY  2009  . TOTAL KNEE ARTHROPLASTY Right 06/27/2016   Procedure: TOTAL KNEE ARTHROPLASTY;  Surgeon: Leandrew Koyanagi, MD;  Location: [facility];  Service: Orthopedics;  Laterality: Right;   Family History  Problem Relation Age of Onset  . Lung cancer Father        smoker  . Alcohol abuse Father   . Tuberculosis Father   . Heart disease Father   . Early death Father   . Diabetes Other   . Heart disease Mother   . Stroke Neg Hx    Allergies as of 10/02/2018       Reactions   Morphine And Related Other (See Comments)   Severe drop in blood pressure   Citalopram Hydrobromide Other (See Comments)   PATIENT PREFERENCE "Just ineffective"   Sertraline Hcl Other (See Comments)   REACTION: headache      Medication List       Accurate as of October 02, 2018  1:32 PM. Always use your most recent med list.        amLODipine 10 MG tablet Commonly known as:  NORVASC Take  1 tablet (10 mg total) by mouth daily.   b complex vitamins tablet Take 1 tablet by mouth every other day.   carbamide peroxide 6.5 % OTIC solution Commonly known as:  Debrox 2x daily until cerumen clears, then use PRN   lisinopril 20 MG tablet Commonly known as:  PRINIVIL,ZESTRIL Take 1 tablet (20 mg total) by mouth daily. Needs office visit prior to any additional refills   ondansetron 4 MG tablet Commonly known as:  ZOFRAN Take 1 tablet (4 mg total) by mouth every 6 (six) hours.   vitamin C 500 MG tablet Commonly known as:  ASCORBIC ACID Take 500 mg by mouth 3 (three) times daily.       All past medical history, surgical history, allergies, family history, immunizations andmedications were updated in the EMR today and reviewed under the history and medication portions of their EMR.     ROS: Negative, with the exception of above mentioned in HPI   Objective:  BP 132/80 (BP Location: Left Arm, Patient Position: Sitting, Cuff Size: Normal)   Pulse 66   Temp 98.4 F (36.9 C) (Oral)   Resp 16   Ht 5\' 11"  (1.803 m)   Wt 200 lb 6 oz (90.9 kg)   SpO2 95%   BMI 27.95 kg/m  Body mass index is 27.95 kg/m. Gen: Afebrile. No acute distress. Nontoxic in appearance, well developed, well nourished.  HENT: AT. [place].  MMM Eyes:Pupils Equal Round Reactive to light, Extraocular movements intact,  Conjunctiva without redness, discharge or icterus. Neck/lymp/endocrine: Supple, no lymphadenopathy CV: RRR no murmur, no edema Chest: CTAB, no wheeze or crackles. Good air movement,  normal resp effort.  Abd: Soft.  Flat NTND. BS present.  No masses palpated. No rebound or guarding.  MSK: no Skin:  rashes, purpura or petechiae.  Neuro:  Normal gait. PERLA. EOMi. Alert. Oriented x3  No exam data present No results found. No results found for this or any previous visit (from the past 24 hour(s)).  Assessment/Plan: VICKI PASQUAL is a 73 y.o. male present for OV for  Gastroenteritis Patient improved.  Reviewed labs and CT along with ED visit notes.  Patient no longer febrile.  His stomach is no longer bothering him.  Any slowly starting to increase p.o. intake.  Advised him to start with fluids, G2, broth, soups etc. advance in 24 hours if tolerating to a soft bland for another 24 hours. -He should hold his blood pressure medicines for another 1 to 2 days until diet is back to normal. Follow-up PRN   Reviewed expectations re: course of current medical issues.  Discussed self-management of symptoms.  Outlined signs and symptoms indicating need for more acute intervention.  Patient verbalized understanding and all questions were answered.  Patient received an After-Visit Summary.    No orders of the defined types were placed in this encounter.    Note is dictated utilizing voice recognition software. Although note has been proof read prior to signing, occasional typographical errors still can be missed. If any questions arise, please do not hesitate to call for verification.   electronically signed by:  Howard Pouch, DO  [place]

## 2018-10-06 ENCOUNTER — Encounter: Payer: Self-pay | Admitting: Family Medicine

## 2018-10-20 ENCOUNTER — Other Ambulatory Visit: Payer: Self-pay | Admitting: Family Medicine

## 2018-10-20 NOTE — Telephone Encounter (Signed)
Called and spoke with pt and he agreed to a doxy.Me visit. Pt has enough medication to last until this visit

## 2018-10-20 NOTE — Telephone Encounter (Signed)
Seemed refill request for patient's blood pressure medications.  Please call patient and schedule him for a virtual visit so that we can refill his blood pressure medications. These ask him to take his blood pressure at least 2 hours after taking his blood pressure medication when sitting down for at least 10 minutes prior to his virtual visit, so that we can set of accurate vitals for him.

## 2018-10-27 ENCOUNTER — Ambulatory Visit: Payer: Self-pay | Admitting: Family Medicine

## 2018-12-24 ENCOUNTER — Telehealth: Payer: Self-pay | Admitting: Orthopaedic Surgery

## 2018-12-24 NOTE — Telephone Encounter (Signed)
Returned call to patient left message to call back. 

## 2018-12-25 ENCOUNTER — Telehealth: Payer: Self-pay | Admitting: Orthopaedic Surgery

## 2018-12-25 NOTE — Telephone Encounter (Signed)
Patient called left vm stated needed appt. Called pt left a message [phone] advised to call back an make appt

## 2018-12-29 ENCOUNTER — Ambulatory Visit: Payer: Self-pay

## 2018-12-29 ENCOUNTER — Other Ambulatory Visit: Payer: Self-pay

## 2018-12-29 ENCOUNTER — Ambulatory Visit (INDEPENDENT_AMBULATORY_CARE_PROVIDER_SITE_OTHER): Payer: Medicare Other | Admitting: Orthopaedic Surgery

## 2018-12-29 ENCOUNTER — Encounter: Payer: Self-pay | Admitting: Orthopaedic Surgery

## 2018-12-29 DIAGNOSIS — M5416 Radiculopathy, lumbar region: Secondary | ICD-10-CM | POA: Diagnosis not present

## 2018-12-29 DIAGNOSIS — M25551 Pain in right hip: Secondary | ICD-10-CM

## 2018-12-29 MED ORDER — PREDNISONE 10 MG (21) PO TBPK: ORAL_TABLET | ORAL | 0 refills | Status: DC

## 2018-12-29 MED ORDER — METHOCARBAMOL 500 MG PO TABS: 500.0000 mg | ORAL_TABLET | Freq: Every evening | ORAL | 0 refills | Status: DC | PRN

## 2018-12-29 NOTE — Progress Notes (Signed)
Office Visit Note   Patient: Nathan [name]           Date of Birth: [birth year]           MRN: [id number] Visit Date: 12/29/2018              Requested by: Ma Hillock, DO [address] [place],  [place] [address] PCP: Ma Hillock, DO   Assessment & Plan: Visit Diagnoses:  1. Lumbar radiculopathy   2. Pain of right hip joint     Plan: Impression is right-sided lumbar radiculopathy.  We will start the patient on a Sterapred taper and muscle relaxer.  I will send him to formal physical therapy as well.  Prescription was written for this.  We have discussed MRI of the lumbar spine should his symptoms not continue to improve.  He will call and let us know if he wishes to proceed.  Otherwise, follow-up with [place] as needed for his back.  Follow-Up Instructions: Return if symptoms worsen or fail to improve.   Orders:  Orders Placed This Encounter  Procedures  . XR HIP UNILAT W OR W/O PELVIS 2-3 VIEWS RIGHT  . XR Lumbar Spine 2-3 Views   Meds ordered this encounter  Medications  . predniSONE (STERAPRED UNI-PAK 21 TAB) 10 MG (21) TBPK tablet    Sig: Take as directed    Dispense:  21 tablet    Refill:  0  . methocarbamol (ROBAXIN) 500 MG tablet    Sig: Take 1 tablet (500 mg total) by mouth at bedtime as needed for muscle spasms.    Dispense:  14 tablet    Refill:  0      Procedures: No procedures performed   Clinical Data: No additional findings.   Subjective: Chief Complaint  Patient presents with  . Lower Back - Pain  . Right Hip - Pain    HPI patient is a pleasant 73 year old gentleman who presents our clinic today with recurrent right buttock pain.  This has been ongoing for the past several months and is recently worsened.  He had increased pain over the past week without a specific injury or change in activity.  The pain he gets is primarily to the right buttocks.  He denies any pain in the groin or anterior thigh.  No pain going down the back of his leg.  There  is nothing that specifically aggravates his pain.  He has been doing some stretching which seems to be helping.  He also takes ibuprofen with mild relief of symptoms.  He has had many months of tingling and burning to both feet.  He has been worked up for diabetic peripheral neuropathy which was negative.  He denies any weakness to either leg.  No bowel or bladder change and no saddle paresthesias.  No previous epidural steroid injection or recent MRI of the lumbar spine.  Review of Systems as detailed in HPI.  All others reviewed and are negative.   Objective: Vital Signs: There were no vitals taken for this visit.  Physical Exam well-developed well-nourished gentleman no acute distress.  Alert and oriented x3.  Ortho Exam examination of his lumbar spine reveals no spinous or paraspinous tenderness.  He does have slight tenderness to the sciatic notch on the right.  Positive straight leg raise.  Negative logroll and negative Fater.  No focal weakness.  He is neurovascularly intact distally.  Specialty Comments:  No specialty comments available.  Imaging: Xr Hip Unilat  W Or W/o Pelvis 2-3 Views Right  Result Date: 12/29/2018 X-rays demonstrate fairly well-preserved joint space without any acute findings  Xr Lumbar Spine 2-3 Views  Result Date: 12/29/2018 X-rays demonstrate multilevel degenerative disc disease with an anterolisthesis of L4    PMFS History: Patient Active Problem List   Diagnosis Date Noted  . Overweight (BMI 25.0-29.9) 04/20/2018  . Atherosclerosis of aorta ([facility]) 12/18/2015  . Hearing aid worn 08/24/2015  . Essential hypertension, benign 08/24/2015  . Hyperlipidemia 06/29/2007   Past Medical History:  Diagnosis Date  . Adrenal mass ([facility]) 2018   Urology fully evaluated; benign  . Allergy    hay fever  . Anxiety   . Cataract   . Complication of anesthesia    hypoglycemia, low blood pressure, pt. reports it dropped to zero- post op MORPHINE , then taken ICU  due to either the morphine or hypoglycemia . In addition post op from cataract surgery- 07/2015, experienced an episode of hypoglycemia    . Depressive disorder, not elsewhere classified   . Diverticulosis   . Elevated prostate specific antigen (PSA) 2008   Dr Jeffie Pollock  . Gallstone   . H/O cardiovascular stress test >10 yrs.   told that its wnl. Told to drink less coffee  . Hematuria 2018   Hematuria with enlarged prostate, kidney cyst, adrenal adenoma--> followed by urology with cystoscopy and repeat CT, benign workup.  [name] Hx of migraines   . Hypertension   . Hypoglycemia   . IBS (irritable bowel syndrome)   . Inguinal hernia 12/2015   Bilateral small indirect hernias on CT, umbilical hernia also present.  . Kidney cyst, acquired 2018   Followed by urology, benign.  . Macular degeneration (senile) of retina, unspecified    2 different opinions from 2 different Ophth  . Osteoarthrosis, unspecified whether generalized or localized, unspecified site   . Other and unspecified hyperlipidemia   . Prostatitis 2008  . Refusal of blood transfusions as patient is Jehovah's Witness   . Spondylosis of lumbar spine   . 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    Family History  Problem Relation Age of Onset  . Lung cancer Father        smoker  . Alcohol abuse Father   . Tuberculosis Father   . Heart disease Father   . Early death Father   . Diabetes Other   . Heart disease Mother   . Stroke Neg Hx     Past Surgical History:  Procedure Laterality Date  . CATARACT EXTRACTION, BILATERAL    . COLONOSCOPY  2012   Dr Olevia Perches  . CYSTOSCOPY     for hematuria  . hydrocoelectomy  2003   Dr Jeffie Pollock  . KNEE ARTHROSCOPY Left 1994  . prostate nodule resection  2003  . TONSILLECTOMY    . TOTAL KNEE ARTHROPLASTY  2009  . TOTAL KNEE ARTHROPLASTY Right 06/27/2016   Procedure: TOTAL KNEE ARTHROPLASTY;  Surgeon: Leandrew Koyanagi, MD;  Location: [facility];  Service: Orthopedics;   Laterality: Right;   Social History   Occupational History  . Not on file  Tobacco Use  . Smoking status: Former Smoker    Quit date: 07/15/1968    Years since quitting: 50.4  . Smokeless tobacco: Never Used  . Tobacco comment: smoked [place], up to 1ppd  Substance and Sexual Activity  . Alcohol use: Yes    Alcohol/week: 2.0 - 3.0 standard drinks  Types: 2 - 3 Shots of liquor per week  . Drug use: No  . Sexual activity: Yes    Partners: Female    Birth control/protection: None

## 2019-01-08 ENCOUNTER — Ambulatory Visit: Payer: Self-pay | Admitting: *Deleted

## 2019-01-08 NOTE — Telephone Encounter (Signed)
Summary: dizzy    Wife called - she says that pt is dizzy ans was sweating. He was working outside and bent over to pick up stick and got dizzy. He is inside now and no longer sweating. bp 137/65 pulse 65. No fever. Would like call back          Returned call to patient and his wife. She stated that he had been out side and was sitting down, went to pick up something and became dizzy. The wife stated that she took his b/p 137/76 and HR 65. And she thought he had become a little sweaty but not right now. He denies chest pain, shortness of breath, fever. And this happened again on Wednesday. Stated he has had enough fluids so not dehydrated. Advised of having a virtual appointment tomorrow. They voiced understanding. Advised that if it keeps happening, he whould be seen in the ED Routing to the practice for review. Reason for Disposition . [1] MILD dizziness (e.g., walking normally) AND [2] has NOT been evaluated by physician for this  (Exception: dizziness caused by heat exposure, sudden standing, or poor fluid intake)  Answer Assessment - Initial Assessment Questions 1. DESCRIPTION: "Describe your dizziness."     Light headed  2. LIGHTHEADED: "Do you feel lightheaded?" (e.g., somewhat faint, woozy, weak upon standing)     Somewhat fant 3. VERTIGO: "Do you feel like either you or the room is spinning or tilting?" (i.e. vertigo)     no 4. SEVERITY: "How bad is it?"  "Do you feel like you are going to faint?" "Can you stand and walk?"   - MILD - walking normally   - MODERATE - interferes with normal activities (e.g., work, school)    - SEVERE - unable to stand, requires support to walk, feels like passing out now.      Mild now 5. ONSET:  "When did the dizziness begin?"    Wednesday 6. AGGRAVATING FACTORS: "Does anything make it worse?" (e.g., standing, change in head position)     no 7. HEART RATE: "Can you tell me your heart rate?" "How many beats in 15 seconds?"  (Note: not all  patients can do this)       65 8. CAUSE: "What do you think is causing the dizziness?"     Not sure 9. RECURRENT SYMPTOM: "Have you had dizziness before?" If so, ask: "When was the last time?" "What happened that time?"     yes 10. OTHER SYMPTOMS: "Do you have any other symptoms?" (e.g., fever, chest pain, vomiting, diarrhea, bleeding)       no 11. PREGNANCY: "Is there any chance you are pregnant?" "When was your last menstrual period?"       no  Protocols used: DIZZINESS [facility]

## 2019-01-09 ENCOUNTER — Other Ambulatory Visit: Payer: Self-pay

## 2019-01-09 ENCOUNTER — Ambulatory Visit (INDEPENDENT_AMBULATORY_CARE_PROVIDER_SITE_OTHER): Payer: Medicare Other | Admitting: Family Medicine

## 2019-01-09 ENCOUNTER — Encounter: Payer: Self-pay | Admitting: Family Medicine

## 2019-01-09 VITALS — BP 140/81 | HR 59 | Temp 97.7°F | Wt 192.0 lb

## 2019-01-09 DIAGNOSIS — E7800 Pure hypercholesterolemia, unspecified: Secondary | ICD-10-CM | POA: Diagnosis not present

## 2019-01-09 DIAGNOSIS — I10 Essential (primary) hypertension: Secondary | ICD-10-CM

## 2019-01-09 DIAGNOSIS — R42 Dizziness and giddiness: Secondary | ICD-10-CM

## 2019-01-09 NOTE — Progress Notes (Signed)
Virtual visit completed through [employer]. Due to national recommendations of social distancing due to COVID-19, a virtual visit is felt to be most appropriate for this patient at this time. Reviewed limitations of a virtual visit.   Patient location: home, wife Nathan [name] also on video call Provider location: Velora Heckler at [facility], office If any vitals were documented, they were collected by patient at home unless specified below.    BP 140/81 (BP Location: Right Arm, Cuff Size: Normal)   Pulse (!) 59   Temp 97.7 F (36.5 C)   Wt 192 lb (87.1 kg)   BMI 26.78 kg/m    CC: dizziness Subjective:    Patient ID: Nathan [name], male    DOB: 1945/11/24, 73 y.o.   MRN: [id number]  HPI: Nathan [name] is a 73 y.o. male presenting on 01/09/2019 for Dizziness (Episode of dizziness Wednesday and yesterday. )   2 episodes of dizziness this past week. First episode occurred while playing golf - noted when bending over to pick up golf ball, transient dizziness for 30 seconds that quickly resolved. Second episode was more severe - happened yesterday while picking up branches outdoors, sudden severe dizziness that lasted 10 minutes. Afterwards developed mild headache and persistent mild dizziness. Dizziness described as disoriented, unsteady imbalance with vertigo. Some presyncope but not as much as vertigo sensation. He had more trouble with sudden eye movements. No nausea with this.   BP 130/70s during episode, today elevated but he hasn't taken bp meds yet.  Today feeling almost fully back to normal.   Known HTN on amlodipine and lisinopril.  Known HLD not on statin.  No known h/o CVA.   No slurred speech, no facial weakness, or unilateral weakness, no numbness. Some paresthesias of head.      Relevant past medical, surgical, family and social history reviewed and updated as indicated. Interim medical history since our last visit reviewed. Allergies and medications reviewed and updated. Outpatient  Medications Prior to Visit  Medication Sig Dispense Refill  . amLODipine (NORVASC) 10 MG tablet Take 1 tablet (10 mg total) by mouth daily. 90 tablet 1  . b complex vitamins tablet Take 1 tablet by mouth every other day.    . carbamide peroxide (DEBROX) 6.5 % OTIC solution 2x daily until cerumen clears, then use PRN 15 mL 0  . lisinopril (PRINIVIL,ZESTRIL) 20 MG tablet Take 1 tablet (20 mg total) by mouth daily. Needs office visit prior to any additional refills 90 tablet 1  . methocarbamol (ROBAXIN) 500 MG tablet Take 1 tablet (500 mg total) by mouth at bedtime as needed for muscle spasms. 14 tablet 0  . vitamin C (ASCORBIC ACID) 500 MG tablet Take 500 mg by mouth 3 (three) times daily.    . ondansetron (ZOFRAN) 4 MG tablet Take 1 tablet (4 mg total) by mouth every 6 (six) hours. (Patient not taking: Reported on 01/09/2019) 12 tablet 0  . predniSONE (STERAPRED UNI-PAK 21 TAB) 10 MG (21) TBPK tablet Take as directed (Patient not taking: Reported on 01/09/2019) 21 tablet 0   No facility-administered medications prior to visit.      Per HPI unless specifically indicated in ROS section below Review of Systems Objective:    BP 140/81 (BP Location: Right Arm, Cuff Size: Normal)   Pulse (!) 59   Temp 97.7 F (36.5 C)   Wt 192 lb (87.1 kg)   BMI 26.78 kg/m   Wt Readings from Last 3 Encounters:  01/09/19 192 lb (87.1  kg)  10/02/18 200 lb 6 oz (90.9 kg)  09/30/18 198 lb (89.8 kg)     Physical exam: Gen: alert, NAD, not ill appearing Pulm: speaks in complete sentences without increased work of breathing Psych: normal mood, normal thought content  Neuro: limited CN exam conducted through video largely intact, EOMI     Lab Results  Component Value Date   CHOL 290 (H) 04/20/2018   HDL 65 04/20/2018   LDLCALC 199 (H) 04/20/2018   LDLDIRECT 207.3 02/22/2013   TRIG 120 04/20/2018   CHOLHDL 4.5 04/20/2018    Assessment & Plan:   Problem List Items Addressed This Visit    Vertigo -  Primary    Describes episodes of vertigo, yesterday's more severe, associated with mild headache. Today feeling well. Vitals largely stable.  Given risk factors for stroke (HLD with LDL 199, HTN, age), I did recommend further evaluation for central vs peripheral causes of vertigo with in-office visit on Monday - scheduled.  In interim, rec start aspirin 81mg  daily, rest over weekend. If recurrent vertigo/dizziness, rec seek ER care. Pt and wife agree with plan.       Hyperlipidemia   Essential hypertension, benign       No orders of the defined types were placed in this encounter.  No orders of the defined types were placed in this encounter.   I discussed the assessment and treatment plan with the patient. The patient was provided an opportunity to ask questions and all were answered. The patient agreed with the plan and demonstrated an understanding of the instructions. The patient was advised to call back or seek an in-person evaluation if the symptoms worsen or if the condition fails to improve as anticipated.  Follow up plan: Return if symptoms worsen or fail to improve.  Ria Bush, MD

## 2019-01-09 NOTE — Assessment & Plan Note (Addendum)
Describes episodes of vertigo, yesterday's more severe, associated with mild headache. Today feeling well. Vitals largely stable.  Given risk factors for stroke (HLD with LDL 199, HTN, age), I did recommend further evaluation for central vs peripheral causes of vertigo with in-office visit on Monday - scheduled.  In interim, rec start aspirin 81mg  daily, rest over weekend. If recurrent vertigo/dizziness, rec seek ER care. Pt and wife agree with plan.

## 2019-01-11 ENCOUNTER — Ambulatory Visit (INDEPENDENT_AMBULATORY_CARE_PROVIDER_SITE_OTHER): Payer: Medicare Other | Admitting: Family Medicine

## 2019-01-11 ENCOUNTER — Other Ambulatory Visit: Payer: Self-pay

## 2019-01-11 ENCOUNTER — Encounter: Payer: Self-pay | Admitting: Family Medicine

## 2019-01-11 VITALS — BP 109/63 | HR 60 | Temp 97.9°F | Resp 16 | Ht 71.0 in | Wt 196.5 lb

## 2019-01-11 DIAGNOSIS — E7800 Pure hypercholesterolemia, unspecified: Secondary | ICD-10-CM | POA: Diagnosis not present

## 2019-01-11 DIAGNOSIS — R42 Dizziness and giddiness: Secondary | ICD-10-CM | POA: Diagnosis not present

## 2019-01-11 DIAGNOSIS — R011 Cardiac murmur, unspecified: Secondary | ICD-10-CM | POA: Diagnosis not present

## 2019-01-11 LAB — CBC WITH DIFFERENTIAL/PLATELET
Basophils Absolute: 0 10*3/uL (ref 0.0–0.1)
Basophils Relative: 0.4 % (ref 0.0–3.0)
Eosinophils Absolute: 0.1 10*3/uL (ref 0.0–0.7)
Eosinophils Relative: 1.8 % (ref 0.0–5.0)
HCT: 42.7 % (ref 39.0–52.0)
Hemoglobin: 14.4 g/dL (ref 13.0–17.0)
Lymphocytes Relative: 35.2 % (ref 12.0–46.0)
Lymphs Abs: 2.7 10*3/uL (ref 0.7–4.0)
MCHC: 33.7 g/dL (ref 30.0–36.0)
MCV: 88.7 fl (ref 78.0–100.0)
Monocytes Absolute: 0.5 10*3/uL (ref 0.1–1.0)
Monocytes Relative: 7.1 % (ref 3.0–12.0)
Neutro Abs: 4.3 10*3/uL (ref 1.4–7.7)
Neutrophils Relative %: 55.5 % (ref 43.0–77.0)
Platelets: 281 10*3/uL (ref 150.0–400.0)
RBC: 4.82 Mil/uL (ref 4.22–5.81)
RDW: 14.9 % (ref 11.5–15.5)
WBC: 7.7 10*3/uL (ref 4.0–10.5)

## 2019-01-11 LAB — COMPREHENSIVE METABOLIC PANEL
ALT: 16 U/L (ref 0–53)
AST: 17 U/L (ref 0–37)
Albumin: 4.1 g/dL (ref 3.5–5.2)
Alkaline Phosphatase: 57 U/L (ref 39–117)
BUN: 17 mg/dL (ref 6–23)
CO2: 26 mEq/L (ref 19–32)
Calcium: 8.9 mg/dL (ref 8.4–10.5)
Chloride: 104 mEq/L (ref 96–112)
Creatinine, Ser: 1.1 mg/dL (ref 0.40–1.50)
GFR: 65.61 mL/min (ref >60.00)
Glucose, Bld: 128 mg/dL — ABNORMAL HIGH (ref 70–99)
Potassium: 4.1 mEq/L (ref 3.5–5.1)
Sodium: 138 mEq/L (ref 135–145)
Total Bilirubin: 0.4 mg/dL (ref 0.2–1.2)
Total Protein: 6.4 g/dL (ref 6.0–8.3)

## 2019-01-11 LAB — TSH: TSH: 1.39 u[IU]/mL (ref 0.35–4.50)

## 2019-01-11 MED ORDER — ATORVASTATIN CALCIUM 40 MG PO TABS: 40.0000 mg | ORAL_TABLET | Freq: Every day | ORAL | 3 refills | Status: DC

## 2019-01-11 NOTE — Telephone Encounter (Signed)
Appt scheduled for today.

## 2019-01-11 NOTE — Progress Notes (Signed)
Nathan [name] , 1945-12-14, 73 y.o., male MRN: [id number] Patient Care Team    Relationship Specialty Notifications Start End  Ma Hillock, DO PCP - General Family Medicine  12/07/15   Pa, [facility]    06/02/17   Leandrew Koyanagi, MD Attending Physician Orthopedic Surgery  06/02/17     Chief Complaint  Patient presents with  . Dizziness    Pt is following up for Vertigo. Pt is doing much better with Vertigo and started 1 baby ASA daily      Subjective: Pt presents for an OV with complaints of two episodes of dizziness for the past week.  Of note he had a syncopal episode March when he had a GI illness as well.  Patient reports his first episode over the last week occurred when he bent over to pick something up he had some mild dizziness that quickly resolved.  The second episode he reports he was working in his garage, not doing anything strenuous, then went out into the yard and picked up some sticks and he became very dizzy when he bent over.  He states if he had not been near the fence to grab on he feels he would have fell.  He states he is never had dizziness like that in the past.  He was dizzy for approximately 10 minutes and this was followed by headache on the top of his head.  He reports it took him about a half a day to feel back to his normal.  He denies any unilateral weakness.  He denies any sustained symptoms.  He states he is feeling better today. Of note patient has had a history of hyperlipidemia and has declined statin use.  He states his cholesterol has always been significantly high and he feels that means that if he is still around see that that he must be doing okay and not need the medicine.  He denies any recent illnesses.  He denies any chest pain or shortness of breath with dizziness.  Depression screen [facility] 2/9 04/20/2018 06/02/2017 05/12/2017 08/24/2015 08/24/2015  Decreased Interest 0 0 0 2 2  Down, Depressed, Hopeless 0 0 0 2 2  PHQ - 2 Score 0 0 0  4 4  Altered sleeping 0 0 - 0 0  Tired, decreased energy 1 0 - 3 2  Change in appetite 0 0 - 2 1  Feeling bad or failure about yourself  0 0 - 2 2  Trouble concentrating 0 0 - 2 2  Moving slowly or fidgety/restless 0 0 - 0 0  Suicidal thoughts 0 0 - 1 0  PHQ-9 Score 1 0 - 14 11  Difficult doing work/chores Not difficult at all Not difficult at all - Very difficult Somewhat difficult    Allergies  Allergen Reactions  . Morphine And Related Other (See Comments)    Severe drop in blood pressure  . Citalopram Hydrobromide Other (See Comments)    PATIENT PREFERENCE "Just ineffective"  . Sertraline Hcl Other (See Comments)    REACTION: headache   Social History   Social History Narrative   Married. Wife's name is [name]. Full time employed, Pensions consultant.   Drinks caffeinated beverages. Take a multivitamin. Wears his seatbelt.   Exercises at least 3 times a week. Is not a strict vegetarian, but only eats meat 1-2 times a week   Wears a hearing aid. Does not wear dentures or use an assistive walking device.  There is a smoke detector in his home.   He feels safe in his relationships.   Past Medical History:  Diagnosis Date  . Adrenal mass ([place]) 2018   Urology fully evaluated; benign  . Allergy    hay fever  . Anxiety   . Cataract   . Complication of anesthesia    hypoglycemia, low blood pressure, pt. reports it dropped to zero- post op MORPHINE , then taken ICU due to either the morphine or hypoglycemia . In addition post op from cataract surgery- 07/2015, experienced an episode of hypoglycemia    . Depressive disorder, not elsewhere classified   . Diverticulosis   . Elevated prostate specific antigen (PSA) 2008   Dr Jeffie Pollock  . Gallstone   . H/O cardiovascular stress test >10 yrs.   told that its wnl. Told to drink less coffee  . Hematuria 2018   Hematuria with enlarged prostate, kidney cyst, adrenal adenoma--> followed by urology with cystoscopy and repeat CT,  benign workup.  [name] Hx of migraines   . Hypertension   . Hypoglycemia   . IBS (irritable bowel syndrome)   . Inguinal hernia 12/2015   Bilateral small indirect hernias on CT, umbilical hernia also present.  . Kidney cyst, acquired 2018   Followed by urology, benign.  . Macular degeneration (senile) of retina, unspecified    2 different opinions from 2 different Ophth  . Osteoarthrosis, unspecified whether generalized or localized, unspecified site   . Other and unspecified hyperlipidemia   . Prostatitis 2008  . Refusal of blood transfusions as patient is Jehovah's Witness   . Spondylosis of lumbar spine   . 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   Past Surgical History:  Procedure Laterality Date  . CATARACT EXTRACTION, BILATERAL    . COLONOSCOPY  2012   Dr Olevia Perches  . CYSTOSCOPY     for hematuria  . hydrocoelectomy  2003   Dr Jeffie Pollock  . KNEE ARTHROSCOPY Left 1994  . prostate nodule resection  2003  . TONSILLECTOMY    . TOTAL KNEE ARTHROPLASTY  2009  . TOTAL KNEE ARTHROPLASTY Right 06/27/2016   Procedure: TOTAL KNEE ARTHROPLASTY;  Surgeon: Leandrew Koyanagi, MD;  Location: [facility];  Service: Orthopedics;  Laterality: Right;   Family History  Problem Relation Age of Onset  . Lung cancer Father        smoker  . Alcohol abuse Father   . Tuberculosis Father   . Heart disease Father   . Early death Father   . Diabetes Other   . Heart disease Mother   . Stroke Neg Hx    Allergies as of 01/11/2019      Reactions   Morphine And Related Other (See Comments)   Severe drop in blood pressure   Citalopram Hydrobromide Other (See Comments)   PATIENT PREFERENCE "Just ineffective"   Sertraline Hcl Other (See Comments)   REACTION: headache      Medication List       Accurate as of January 11, 2019  5:15 PM. If you have any questions, ask your nurse or doctor.        STOP taking these medications   methocarbamol 500 MG tablet Commonly known as: Robaxin  Stopped by: Howard Pouch, DO   ondansetron 4 MG tablet Commonly known as: ZOFRAN Stopped by: Howard Pouch, DO   predniSONE 10 MG (21) Tbpk tablet Commonly known as: STERAPRED UNI-PAK 21 TAB Stopped by: Howard Pouch,  DO     TAKE these medications   amLODipine 10 MG tablet Commonly known as: NORVASC Take 1 tablet (10 mg total) by mouth daily.   aspirin EC 81 MG tablet Take 81 mg by mouth daily.   atorvastatin 40 MG tablet Commonly known as: LIPITOR Take 1 tablet (40 mg total) by mouth daily. Started by: Howard Pouch, DO   b complex vitamins tablet Take 1 tablet by mouth every other day.   carbamide peroxide 6.5 % OTIC solution Commonly known as: Debrox 2x daily until cerumen clears, then use PRN   lisinopril 20 MG tablet Commonly known as: ZESTRIL Take 1 tablet (20 mg total) by mouth daily. Needs office visit prior to any additional refills   MULTIPLE VITAMIN PO Take 1 tablet by mouth daily.   Vitamin A 2250 MCG (7500 UT) Caps Take 1 tablet by mouth daily.   vitamin C 500 MG tablet Commonly known as: ASCORBIC ACID Take 500 mg by mouth 3 (three) times daily.   Vitamin D (Cholecalciferol) 25 MCG (1000 UT) Caps Take 1 tablet by mouth daily.       All past medical history, surgical history, allergies, family history, immunizations andmedications were updated in the EMR today and reviewed under the history and medication portions of their EMR.     ROS: Negative, with the exception of above mentioned in HPI   Objective:  BP 109/63 (BP Location: Right Arm, Patient Position: Sitting, Cuff Size: Normal)   Pulse 60   Temp 97.9 F (36.6 C) (Temporal)   Resp 16   Ht 5' 11"  (1.803 m)   Wt 196 lb 8 oz (89.1 kg)   SpO2 97%   BMI 27.41 kg/m  Body mass index is 27.41 kg/m.  Orthostatics negative and BP back to his normal. Gen: Afebrile. No acute distress. Nontoxic in appearance, well developed, well nourished.  HENT: AT. [place].  MMM Eyes:Pupils Equal Round Reactive  to light, Extraocular movements intact,  Conjunctiva without redness, discharge or icterus. Neck/lymp/endocrine: Supple,no lymphadenopathy, no thyromegaly.  CV: RRR new 1/6 SM appreciated, no edema Chest: CTAB, no wheeze or crackles. Good air movement, normal resp effort.  Abd: Soft. NTND. BS present.  Neuro:  Normal gait. PERLA. EOMi. Alert. Oriented x3  Psych: Normal affect, dress and demeanor. Normal speech. Normal thought content and judgment.  No exam data present No results found. No results found for this or any previous visit (from the past 24 hour(s)).  Assessment/Plan: JAKSEN FIORELLA is a 73 y.o. male present for OV for  Newly recognized heart murmur/Dizziness/Pure hypercholesterolemia Patient reports symptoms have completely resolved and have not reoccurred since the second event over the weekend.   -Neuro exam is normal today, discussed the difference between stroke and TIA.  - He is agreeable now to start a statin which is great.  Lengthy discussion today surrounding the benefits of statin and the effects of elevated cholesterol on the brain, heart and vessels. -Atorvastatin 40 mg daily prescribed. -I do appreciate a new murmur today.  Concerned this may be at least part of the cause of his dizziness.  He is agreeable to echocardiogram and carotid Doppler study.   - Carotid Dopplers and ECHOCARDIOGRAM COMPLETE; Future - CBC w/Diff - Comp Met (CMET) - TSH -Discussed hydrating appropriately, at least 60-80 ounces of water daily. -Continue daily baby aspirin. -Collect lab work today to rule out anemia, electrolyte imbalance/any dysfunction or thyroid disorder as possible cause of his dizziness. -Follow-up dependent upon work-up results.  Since starting statin will at least plan on follow-up in 3 months for cholesterol and LFT check.  Consider cardiology referral after carotid and echo results received.    Reviewed expectations re: course of current medical issues.   Discussed self-management of symptoms.  Outlined signs and symptoms indicating need for more acute intervention.  Patient verbalized understanding and all questions were answered.  Patient received an After-Visit Summary.    Orders Placed This Encounter  Procedures  . US Carotid Duplex Bilateral  . CBC w/Diff  . Comp Met (CMET)  . TSH  . ECHOCARDIOGRAM COMPLETE    > 25 minutes spent with patient, >50% of time spent face to face    Note is dictated utilizing voice recognition software. Although note has been proof read prior to signing, occasional typographical errors still can be missed. If any questions arise, please do not hesitate to call for verification.   electronically signed by:  Howard Pouch, DO  [place]

## 2019-01-11 NOTE — Patient Instructions (Addendum)
They will call you to schedule echo. I did hear a new murmur.  Start the cholesterol medicine- best to take before bed if able to remember.  I will call you with lab result and echo result.   Hydrate as we discussed.  About 80-100  ounces of water a day.   Heart Murmur A heart murmur is an extra sound that is caused by chaotic blood flow through the valves of the heart. The murmur can be heard as a "hum" or "whoosh" sound when blood flows through the heart. There are two types of heart murmurs:  Innocent (benign) murmurs. Most people with this type of heart murmur do not have a heart problem. Many children have innocent heart murmurs. Your health care provider may suggest some basic tests to find out whether your murmur is an innocent murmur. If an innocent heart murmur is found, there is no need for further tests or treatment and no need to restrict activities or stop playing sports.  Abnormal murmurs. These types of murmurs can occur in children and adults. Abnormal murmurs may be a sign of a more serious heart condition, such as a heart defect present at birth (congenital defect) or heart valve disease. What are the causes?  The heart has four areas called chambers. Valves separate the upper and lower chambers from each other (tricuspid valve and mitral valve) and separate the lower chambers of the heart from pathways that lead away from the heart (aortic valve and pulmonary valve). Normally, the valves open to let blood flow through or out of your heart, and then they shut to keep the blood from flowing backward. This condition is caused by heart valves that are not working properly.  In children, abnormal heart murmurs are typically caused by congenital defects.  In adults, abnormal murmurs are usually caused by heart valve problems from disease, infection, or aging. This condition may also be caused by:  Pregnancy.  Fever.  Overactive thyroid gland.  Anemia.  Exercise.  Rapid  growth spurts (in children). What are the signs or symptoms? Innocent murmurs do not cause symptoms, and many people with abnormal murmurs may not have symptoms. If symptoms do develop, they may include:  Shortness of breath.  Blue coloring of the skin, especially on the fingertips.  Chest pain.  Palpitations, or feeling a fluttering or skipped heartbeat.  Fainting.  Persistent cough.  Getting tired much faster than expected.  Swelling in the abdomen, feet, or ankles. How is this diagnosed? This condition may be diagnosed during a routine physical or other exam. If your health care provider hears a murmur with a stethoscope, he or she will listen for:  Where the murmur is located in your heart.  How long the murmur lasts (duration).  When the murmur is heard during the heartbeat.  How loud the murmur is. This may help the health care provider figure out what is causing the murmur. You may be referred to a heart specialist (cardiologist). You may also have other tests, including:  Electrocardiogram (ECG or EKG). This test measures the electrical activity of your heart.  Echocardiogram. This test uses high frequency sound waves to make pictures of your heart.  MRI or chest X-ray.  Cardiac catheterization. This test looks at blood flow through the arteries around the heart. For children and adults who have an abnormal heart murmur and want to stay active, it is important to:  Complete testing.  Review test results.  Receive recommendations from your health care  provider. If heart disease is present, it may not be safe to play or be active. How is this treated? Heart murmurs themselves do not need treatment. In some cases, a heart murmur may go away on its own. If an underlying problem or disease is causing the murmur, you may need treatment. If treatment is needed, it will depend on the type and severity of the disease or heart problem causing the murmur. Treatment may  include:  Medicine.  Surgery.  Dietary and lifestyle changes. Follow these instructions at home:  Talk with your health care provider before participating in sports or other activities that require a lot of effort and energy (are strenuous).  Learn as much as possible about your condition and any related diseases. Ask your health care provider if you may be at risk for any medical emergencies.  Talk with your health care provider about what symptoms you should look out for.  It is up to you to get your test results. Ask your health care provider, or the department that is doing the test, when your results will be ready.  Keep all follow-up visits as told by your health care provider. This is important. Contact a health care provider if:  You are frequently short of breath.  You feel more tired than usual.  You are having a hard time keeping up with normal activities or fitness routines.  You have swelling in your ankles or feet.  You notice that your heart often beats irregularly.  You develop any new symptoms. Get help right away if:  You have chest pain.  You are having trouble breathing.  You feel light-headed or you pass out.  Your symptoms suddenly get worse. These symptoms may represent a serious problem that is an emergency. Do not wait to see if the symptoms will go away. Get medical help right away. Call your local emergency services (911 in the [place] Do not drive yourself to the hospital. Summary  Normally, the heart valves open to let blood flow through or out of your heart, and then they shut to keep the blood from flowing backward.  A heart murmur is caused by heart valves that are not working properly.  You may need treatment if an underlying problem or disease is causing the heart murmur. Treatment may include medicine, surgery, or dietary and lifestyle changes.  Talk with your health care provider before participating in sports or other activities  that require a lot of effort and energy (are strenuous).  Talk with your health care provider about what symptoms you should watch out for. This information is not intended to replace advice given to you by your health care provider. Make sure you discuss any questions you have with your health care provider. Document Released: 08/08/2004 Document Revised: 12/23/2017 Document Reviewed: 12/23/2017 Elsevier Patient Education  [place].    Dehydration, Adult  Dehydration is when there is not enough fluid or water in your body. This happens when you lose more fluids than you take in. Dehydration can range from mild to very bad. It should be treated right away to keep it from getting very bad. Symptoms of mild dehydration may include:  Thirst.  Dry lips.  Slightly dry mouth.  Dry, warm skin.  Dizziness. Symptoms of moderate dehydration may include:  Very dry mouth.  Muscle cramps.  Dark pee (urine). Pee may be the color of tea.  Your body making less pee.  Your eyes making fewer tears.  Heartbeat that  is uneven or faster than normal (palpitations).  Headache.  Light-headedness, especially when you stand up from sitting.  Fainting (syncope). Symptoms of very bad dehydration may include:  Changes in skin, such as: ? Cold and clammy skin. ? Blotchy (mottled) or pale skin. ? Skin that does not quickly return to normal after being lightly pinched and let go (poor skin turgor).  Changes in body fluids, such as: ? Feeling very thirsty. ? Your eyes making fewer tears. ? Not sweating when body temperature is high, such as in hot weather. ? Your body making very little pee.  Changes in vital signs, such as: ? Weak pulse. ? Pulse that is more than 100 beats a minute when you are sitting still. ? Fast breathing. ? Low blood pressure.  Other changes, such as: ? Sunken eyes. ? Cold hands and feet. ? Confusion. ? Lack of energy (lethargy). ? Trouble waking up  from sleep. ? Short-term weight loss. ? Unconsciousness. Follow these instructions at home:   If told by your doctor, drink an ORS: ? Make an ORS by using instructions on the package. ? Start by drinking small amounts, about  cup (120 mL) every 5-10 minutes. ? Slowly drink more until you have had the amount that your doctor said to have.  Drink enough clear fluid to keep your pee clear or pale yellow. If you were told to drink an ORS, finish the ORS first, then start slowly drinking clear fluids. Drink fluids such as: ? Water. Do not drink only water by itself. Doing that can make the salt (sodium) level in your body get too low (hyponatremia). ? Ice chips. ? Fruit juice that you have added water to (diluted). ? Low-calorie sports drinks.  Avoid: ? Alcohol. ? Drinks that have a lot of sugar. These include high-calorie sports drinks, fruit juice that does not have water added, and soda. ? Caffeine. ? Foods that are greasy or have a lot of fat or sugar.  Take over-the-counter and prescription medicines only as told by your doctor.  Do not take salt tablets. Doing that can make the salt level in your body get too high (hypernatremia).  Eat foods that have minerals (electrolytes). Examples include bananas, oranges, potatoes, tomatoes, and spinach.  Keep all follow-up visits as told by your doctor. This is important. Contact a doctor if:  You have belly (abdominal) pain that: ? Gets worse. ? Stays in one area (localizes).  You have a rash.  You have a stiff neck.  You get angry or annoyed more easily than normal (irritability).  You are more sleepy than normal.  You have a harder time waking up than normal.  You feel: ? Weak. ? Dizzy. ? Very thirsty.  You have peed (urinated) only a small amount of very dark pee during 6-8 hours. Get help right away if:  You have symptoms of very bad dehydration.  You cannot drink fluids without throwing up (vomiting).  Your  symptoms get worse with treatment.  You have a fever.  You have a very bad headache.  You are throwing up or having watery poop (diarrhea) and it: ? Gets worse. ? Does not go away.  You have blood or something green (bile) in your throw-up.  You have blood in your poop (stool). This may cause poop to look black and tarry.  You have not peed in 6-8 hours.  You pass out (faint).  Your heart rate when you are sitting still is more than 100  beats a minute.  You have trouble breathing. This information is not intended to replace advice given to you by your health care provider. Make sure you discuss any questions you have with your health care provider. Document Released: 04/27/2009 Document Revised: 06/13/2017 Document Reviewed: 08/25/2015 Elsevier Patient Education  2020 [employer].

## 2019-01-12 ENCOUNTER — Encounter (HOSPITAL_COMMUNITY): Payer: Self-pay | Admitting: Family Medicine

## 2019-01-26 ENCOUNTER — Other Ambulatory Visit: Payer: Self-pay

## 2019-01-26 ENCOUNTER — Telehealth: Payer: Self-pay | Admitting: Family Medicine

## 2019-01-26 ENCOUNTER — Ambulatory Visit (HOSPITAL_BASED_OUTPATIENT_CLINIC_OR_DEPARTMENT_OTHER)
Admission: RE | Admit: 2019-01-26 | Discharge: 2019-01-26 | Disposition: A | Discharge status: Home / Self Care | Payer: Medicare Other | Source: Ambulatory Visit | Attending: Family Medicine | Admitting: Family Medicine

## 2019-01-26 DIAGNOSIS — R42 Dizziness and giddiness: Secondary | ICD-10-CM

## 2019-01-26 DIAGNOSIS — I240 Acute coronary thrombosis not resulting in myocardial infarction: Secondary | ICD-10-CM | POA: Insufficient documentation

## 2019-01-26 DIAGNOSIS — E7800 Pure hypercholesterolemia, unspecified: Secondary | ICD-10-CM | POA: Insufficient documentation

## 2019-01-26 DIAGNOSIS — I6521 Occlusion and stenosis of right carotid artery: Secondary | ICD-10-CM | POA: Insufficient documentation

## 2019-01-26 DIAGNOSIS — I6523 Occlusion and stenosis of bilateral carotid arteries: Secondary | ICD-10-CM | POA: Diagnosis not present

## 2019-01-26 DIAGNOSIS — R011 Cardiac murmur, unspecified: Secondary | ICD-10-CM | POA: Diagnosis not present

## 2019-01-26 NOTE — Progress Notes (Signed)
  Echocardiogram 2D Echocardiogram has been performed.  Nathan [name] 01/26/2019, 11:25 AM

## 2019-01-26 NOTE — Telephone Encounter (Signed)
Was able to get a hold of Nathan [name] on second try this evening.  All studies were explained to him in detail with his wife.  They were allowed to ask questions.  They reported understanding of instructions and study results. - Pt with possible LV apical thrombus on echocardiogram.  No prior history of MI.  Ejection fraction greater than 50. -Carotid studies with significant right carotid stenosis of 50 to 69%. Left Carotid 1-49%. Studies completed for dizziness and new appreciated heart murmur.  Urgent referral to cardiology placed.  Patient advised if any dizziness, changes in vision, strokelike symptoms or chest pain he is to report to the emergency room immediately via EMS.

## 2019-01-27 NOTE — Telephone Encounter (Signed)
Pt called and VM was left letting him know not to do any cardio until MD appt

## 2019-01-27 NOTE — Telephone Encounter (Signed)
I would recommend he wait until after he is elevated with cardiology- just to be safe. He should get in quickly.

## 2019-01-27 NOTE — Telephone Encounter (Signed)
Pts wife called and said they forgot to ask if patient can ride stationary bike and do light cardio until MD appt. Wife would like to be reached at [phone].  Please advise

## 2019-02-03 ENCOUNTER — Encounter (HOSPITAL_COMMUNITY): Payer: Self-pay | Admitting: *Deleted

## 2019-02-03 ENCOUNTER — Other Ambulatory Visit: Payer: Self-pay

## 2019-02-03 ENCOUNTER — Emergency Department (HOSPITAL_COMMUNITY)
Admission: EM | Admit: 2019-02-03 | Discharge: 2019-02-03 | Disposition: A | Discharge status: Home / Self Care | Payer: Medicare Other | Attending: Emergency Medicine | Admitting: Emergency Medicine

## 2019-02-03 DIAGNOSIS — R55 Syncope and collapse: Secondary | ICD-10-CM | POA: Diagnosis not present

## 2019-02-03 DIAGNOSIS — R531 Weakness: Secondary | ICD-10-CM | POA: Diagnosis not present

## 2019-02-03 DIAGNOSIS — R61 Generalized hyperhidrosis: Secondary | ICD-10-CM | POA: Diagnosis not present

## 2019-02-03 DIAGNOSIS — Z96651 Presence of right artificial knee joint: Secondary | ICD-10-CM | POA: Insufficient documentation

## 2019-02-03 DIAGNOSIS — Z87891 Personal history of nicotine dependence: Secondary | ICD-10-CM | POA: Insufficient documentation

## 2019-02-03 DIAGNOSIS — Z79899 Other long term (current) drug therapy: Secondary | ICD-10-CM | POA: Diagnosis not present

## 2019-02-03 DIAGNOSIS — I959 Hypotension, unspecified: Secondary | ICD-10-CM | POA: Diagnosis not present

## 2019-02-03 DIAGNOSIS — R42 Dizziness and giddiness: Secondary | ICD-10-CM | POA: Insufficient documentation

## 2019-02-03 DIAGNOSIS — I10 Essential (primary) hypertension: Secondary | ICD-10-CM | POA: Diagnosis not present

## 2019-02-03 DIAGNOSIS — Z7982 Long term (current) use of aspirin: Secondary | ICD-10-CM | POA: Insufficient documentation

## 2019-02-03 LAB — BASIC METABOLIC PANEL
Anion gap: 11 (ref 5–15)
BUN: 12 mg/dL (ref 8–23)
CO2: 21 mmol/L — ABNORMAL LOW (ref 22–32)
Calcium: 8.9 mg/dL (ref 8.9–10.3)
Chloride: 105 mmol/L (ref 98–111)
Creatinine, Ser: 1.25 mg/dL — ABNORMAL HIGH (ref 0.61–1.24)
GFR calc Af Amer: >60 mL/min (ref >60)
GFR calc non Af Amer: 57 mL/min — ABNORMAL LOW (ref >60)
Glucose, Bld: 108 mg/dL — ABNORMAL HIGH (ref 70–99)
Potassium: 3.5 mmol/L (ref 3.5–5.1)
Sodium: 137 mmol/L (ref 135–145)

## 2019-02-03 LAB — URINALYSIS, ROUTINE W REFLEX MICROSCOPIC
Bilirubin Urine: NEGATIVE
Glucose, UA: NEGATIVE mg/dL
Hgb urine dipstick: NEGATIVE
Ketones, ur: 20 mg/dL — AB
Leukocytes,Ua: NEGATIVE
Nitrite: NEGATIVE
Protein, ur: NEGATIVE mg/dL
Specific Gravity, Urine: 1.01 (ref 1.005–1.030)
pH: 6 (ref 5.0–8.0)

## 2019-02-03 LAB — CBC
HCT: 40.6 % (ref 39.0–52.0)
Hemoglobin: 13.7 g/dL (ref 13.0–17.0)
MCH: 29.7 pg (ref 26.0–34.0)
MCHC: 33.7 g/dL (ref 30.0–36.0)
MCV: 87.9 fL (ref 80.0–100.0)
Platelets: 252 10*3/uL (ref 150–400)
RBC: 4.62 MIL/uL (ref 4.22–5.81)
RDW: 14.2 % (ref 11.5–15.5)
WBC: 10.8 10*3/uL — ABNORMAL HIGH (ref 4.0–10.5)
nRBC: 0 % (ref 0.0–0.2)

## 2019-02-03 MED ORDER — SODIUM CHLORIDE 0.9% FLUSH: 3.0000 mL | Freq: Once | INTRAVENOUS | Status: DC

## 2019-02-03 NOTE — ED Notes (Signed)
Pt ambulated halls with steady gait.

## 2019-02-03 NOTE — ED Provider Notes (Signed)
[facility] EMERGENCY DEPARTMENT Provider Note   CSN: [id number] Arrival date & time: 02/03/19  1914     History   Chief Complaint Chief Complaint  Patient presents with   Near Syncope    HPI Nathan [name] is a 73 y.o. male.     Patient is a 73 year old male with a history of hypertension, hypoglycemia, prior syncope and presenting today with near syncope and dizziness.  Patient states he felt normal all day long he was sitting outside for approximately 15 minutes grilling and when he stood up he started feeling extremely dizzy and sweaty.  He states the whole world was spinning and he could not walk on his own.  He laid down in his chair and as long as his eyes were closed and he was not moving he felt better but anytime he open his eyes or tries to move the dizziness was worse.  He denied any chest pain, palpitations or shortness of breath.  He had no vomiting, speech difficulty or unilateral weakness or numbness.  He had no facial droop.  This was confirmed by wife and EMS.  Patient states he had a similar event approximately 3 weeks ago and saw his PCP.  He had an outpatient ultrasound of his neck and his heart and states he is to follow-up with cardiology on Friday because he does have known carotid stenosis.  He currently states he feels fine and is symptom-free.  EMS reports upon arrival he was not hypotensive and had no evidence of orthostasis.  The history is provided by the patient and the EMS personnel.  Near Syncope This is a recurrent problem. The current episode started less than 1 hour ago. The problem occurs constantly. The problem has been resolved. Pertinent negatives include no chest pain, no abdominal pain, no headaches and no shortness of breath. Associated symptoms comments: Severe dizziness and diaphoresis. Exacerbated by: any movement. The symptoms are relieved by rest and lying down (being still). He has tried nothing for the symptoms. The  treatment provided moderate relief.    Past Medical History:  Diagnosis Date   Adrenal mass ([place]) 2018   Urology fully evaluated; benign   Allergy    hay fever   Anxiety    Cataract    Complication of anesthesia    hypoglycemia, low blood pressure, pt. reports it dropped to zero- post op MORPHINE , then taken ICU due to either the morphine or hypoglycemia . In addition post op from cataract surgery- 07/2015, experienced an episode of hypoglycemia     Depressive disorder, not elsewhere classified    Diverticulosis    Elevated prostate specific antigen (PSA) 2008   Dr Jeffie Pollock   Gallstone    H/O cardiovascular stress test >10 yrs.   told that its wnl. Told to drink less coffee   Hematuria 2018   Hematuria with enlarged prostate, kidney cyst, adrenal adenoma--> followed by urology with cystoscopy and repeat CT, benign workup.   Hx of migraines    Hypertension    Hypoglycemia    IBS (irritable bowel syndrome)    Inguinal hernia 12/2015   Bilateral small indirect hernias on CT, umbilical hernia also present.   Kidney cyst, acquired 2018   Followed by urology, benign.   Macular degeneration (senile) of retina, unspecified    2 different opinions from 2 different Ophth   Osteoarthrosis, unspecified whether generalized or localized, unspecified site    Other and unspecified hyperlipidemia    Prostatitis 2008  Refusal of blood transfusions as patient is Jehovah's Witness    Spondylosis of lumbar spine    Tuberculosis    had exposure as child tests positive on skin test.    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 01/26/2019   Stenosis of right carotid artery 01/26/2019   Newly recognized heart murmur 01/11/2019   Dizziness 01/11/2019   Overweight (BMI 25.0-29.9) 04/20/2018   Atherosclerosis of aorta ([place]) 12/18/2015   Hearing aid worn 08/24/2015   Essential hypertension,  benign 08/24/2015   Hyperlipidemia 06/29/2007    Past Surgical History:  Procedure Laterality Date   CATARACT EXTRACTION, BILATERAL     COLONOSCOPY  2012   Dr Olevia Perches   CYSTOSCOPY     for hematuria   hydrocoelectomy  2003   Dr Jeffie Pollock   KNEE ARTHROSCOPY Left 1994   prostate nodule resection  2003   TONSILLECTOMY     TOTAL KNEE ARTHROPLASTY  2009   TOTAL KNEE ARTHROPLASTY Right 06/27/2016   Procedure: TOTAL KNEE ARTHROPLASTY;  Surgeon: Leandrew Koyanagi, MD;  Location: [facility];  Service: Orthopedics;  Laterality: Right;        Home Medications    Prior to Admission medications   Medication Sig Start Date End Date Taking? Authorizing Provider  amLODipine (NORVASC) 10 MG tablet Take 1 tablet (10 mg total) by mouth daily. 04/20/18   Kuneff, Renee A, DO  aspirin EC 81 MG tablet Take 81 mg by mouth daily.    [provider]  atorvastatin (LIPITOR) 40 MG tablet Take 1 tablet (40 mg total) by mouth daily. 01/11/19   Kuneff, Renee A, DO  b complex vitamins tablet Take 1 tablet by mouth every other day.    [provider]  carbamide peroxide (DEBROX) 6.5 % OTIC solution 2x daily until cerumen clears, then use PRN 04/20/18   Kuneff, Renee A, DO  lisinopril (PRINIVIL,ZESTRIL) 20 MG tablet Take 1 tablet (20 mg total) by mouth daily. Needs office visit prior to any additional refills 04/20/18   Kuneff, Renee A, DO  MULTIPLE VITAMIN PO Take 1 tablet by mouth daily.    [provider]  Vitamin A 2250 MCG (7500 UT) CAPS Take 1 tablet by mouth daily.    [provider]  vitamin C (ASCORBIC ACID) 500 MG tablet Take 500 mg by mouth 3 (three) times daily.    [provider]  Vitamin D, Cholecalciferol, 25 MCG (1000 UT) CAPS Take 1 tablet by mouth daily.    [provider]    Family History Family History  Problem Relation Age of Onset   Lung cancer Father        smoker   Alcohol abuse Father    Tuberculosis Father    Heart disease Father     Early death Father    Diabetes Other    Heart disease Mother    Stroke Neg Hx     Social History Social History   Tobacco Use   Smoking status: Former Smoker    Quit date: 07/15/1968    Years since quitting: 50.5   Smokeless tobacco: Never Used   Tobacco comment: smoked [place], up to 1ppd  Substance Use Topics   Alcohol use: Yes    Alcohol/week: 2.0 - 3.0 standard drinks    Types: 2 - 3 Shots of liquor per week   Drug use: No     Allergies   Morphine and  related, Citalopram hydrobromide, and Sertraline hcl   Review of Systems Review of Systems  Respiratory: Negative for shortness of breath.   Cardiovascular: Positive for near-syncope. Negative for chest pain.  Gastrointestinal: Negative for abdominal pain.  Neurological: Negative for headaches.  All other systems reviewed and are negative.    Physical Exam Updated Vital Signs BP (!) 147/76 (BP Location: Right Arm)    Pulse 69    Temp 97.7 F (36.5 C) (Oral)    Resp (!) 22    Ht 5\' 11"  (1.803 m)    Wt 86.6 kg    SpO2 98%    BMI 26.64 kg/m   Physical Exam Vitals signs and nursing note reviewed.  Constitutional:      General: He is not in acute distress.    Appearance: He is well-developed.  HENT:     Head: Normocephalic and atraumatic.  Eyes:     General: No visual field deficit.    Conjunctiva/sclera: Conjunctivae normal.     Pupils: Pupils are equal, round, and reactive to light.  Neck:     Musculoskeletal: Normal range of motion and neck supple.  Cardiovascular:     Rate and Rhythm: Normal rate and regular rhythm.     Heart sounds: No murmur.  Pulmonary:     Effort: Pulmonary effort is normal. No respiratory distress.     Breath sounds: Normal breath sounds. No wheezing or rales.  Abdominal:     General: There is no distension.     Palpations: Abdomen is soft.     Tenderness: There is no abdominal tenderness. There is no guarding or rebound.  Musculoskeletal: Normal range of motion.         General: No tenderness.  Skin:    General: Skin is warm and dry.     Findings: No erythema or rash.  Neurological:     General: No focal deficit present.     Mental Status: He is alert and oriented to person, place, and time. Mental status is at baseline.     Cranial Nerves: No cranial nerve deficit or dysarthria.     Sensory: Sensation is intact.     Motor: Motor function is intact. No weakness or pronator drift.     Coordination: Coordination normal. Finger-Nose-Finger Test and Heel to Shin Test normal.     Gait: Gait normal.  Psychiatric:        Mood and Affect: Mood normal.        Behavior: Behavior normal.        Thought Content: Thought content normal.      ED Treatments / Results  Labs (all labs ordered are listed, but only abnormal results are displayed) Labs Reviewed  BASIC METABOLIC PANEL - Abnormal; Notable for the following components:      Result Value   CO2 21 (*)    Glucose, Bld 108 (*)    Creatinine, Ser 1.25 (*)    GFR calc non Af Amer 57 (*)    All other components within normal limits  CBC - Abnormal; Notable for the following components:   WBC 10.8 (*)    All other components within normal limits  URINALYSIS, ROUTINE W REFLEX MICROSCOPIC - Abnormal; Notable for the following components:   Ketones, ur 20 (*)    All other components within normal limits    EKG EKG Interpretation  Date/Time:  Wednesday February 03 2019 19:29:55 EDT Ventricular Rate:  69 PR Interval:    QRS Duration: 111 QT Interval:  431 QTC Calculation: 462 R Axis:   12 Text Interpretation:  Sinus rhythm Ventricular premature complex Nonspecific repol abnormality, lateral leads No significant change since last tracing Confirmed by Blanchie Dessert [id number]) on 02/03/2019 7:40:35 PM   Radiology No results found.  Procedures Procedures (including critical care time)  Medications Ordered in ED Medications  sodium chloride flush (NS) 0.9 % injection 3 mL (has no administration  in time range)     Initial Impression / Assessment and Plan / ED Course  I have reviewed the triage vital signs and the nursing notes.  Pertinent labs & imaging results that were available during my care of the patient were reviewed by me and considered in my medical decision making (see chart for details).        Patient is an elderly male with an episode of dizziness today and near syncope.  Patient describes the dizziness as more vertiginous type symptoms with spinning and making him feel very lightheaded.  He is neurovascularly intact here and his symptoms have resolved.  He does have recently discovered carotid stenosis and is to follow-up with cardiology on Friday.  His symptoms are not reproduced with standing or walking now and he has normal orthostatic vital signs.  He denies any recent change in blood pressure medications and states he has been drinking 100 oz of water per day.  Patient denies any chest pain or shortness of breath and low suspicion for dissection, ACS or PE.  He has no neck pain or head pain concerning for dissection or bleed.  He has no strokelike symptoms and patient's vertiginous symptoms seem unlikely to be TIA given the rapid onset severe symptoms and then resolution.  Patient's labs without acute findings and EKG is unchanged.  Discussed the findings with the patient.  With a normal exam and feeling well with close follow-up this week feel that it is reasonable for him to go home.  Patient ambulated here without difficulty and was given strict return precautions.  Final Clinical Impressions(s) / ED Diagnoses   Final diagnoses:  Near syncope  Dizziness    ED Discharge Orders    None       Blanchie Dessert, MD 02/03/19 2059

## 2019-02-03 NOTE — Discharge Instructions (Addendum)
Your lab work and EKG looked normal today.  Your blood pressure was normal with lying down and standing.  Some of your symptoms sounded more like vertigo or inner ear issues.  Echo make you feel extremely dizzy have difficulty walking and can sometimes make you vomit.  Try not to make any sudden moves and when you get up get up slowly.  Make sure to follow-up with cardiology on Friday as planned.  If you start experiencing headache, neck pain, weakness on one side of your body, chest pain or shortness of breath you should return to the emergency room immediately.

## 2019-02-03 NOTE — ED Triage Notes (Signed)
Pt here via [facility] from home.  He was grilling and had sudden onset of weakness, dizziness and diaphoresis. He went inside and sat down.  Medic on scene stated pt was profusely diaphoretic upon their arrival.  Extensive cardiac hx with recent cards visit.  EKG unremarkable.  Neg orthostatic.  HR 60, cbg 104, bp 106/78.  18 l ac.  150 ns given.

## 2019-02-04 NOTE — Progress Notes (Signed)
Cardiology Office Note:    Date:  02/05/2019   ID:  Nathan [name], DOB 09-22-1945, MRN [id number]  PCP:  Ma Hillock, DO  Cardiologist:  Shirlee More, MD   Referring MD: Ma Hillock, DO  ASSESSMENT:    1. Syncope and collapse   2. Abnormal echocardiogram   3. Essential hypertension, benign   4. Pure hypercholesterolemia   5. Chest pain, unspecified type   6. Dizziness    PLAN:    In order of problems listed above:  1. Who utilizes a monitor for 2 weeks is not concerned about sinus node dysfunction or heart block as the etiology of his episode of syncope.  Unfortunately has a great deal of overlap of symptoms and also has very typical symptoms that sound like benign positional vertigo and for further evaluation I asked him to schedule ENT evaluation in addition to cardiology.  With his known vascular disease and severe hyperlipidemia is at risk for CAD and he will schedule myocardial perfusion study.  If he has recurrent syncope he would require an implanted loop recorder 2. I reviewed his echocardiogram I suspect this is artifact will repeat with contrast 3. Stable hypertension no evidence of orthostatic drop in blood pressure 4. Severe dyslipidemia initially started on a statin a atorvastatin now having constipation will transition to rosuvastatin.  He will need follow-up lipids 1 month 5. At risk for CAD check myocardial perfusion study 6. Symptoms that have characteristics of both syncope and vertigo cardiology evaluation referral to ENT   Is a very complex visit with a large volume of information multiple cardiology programs an extensive discussion and formulation of shared decision making for further evaluation.  With review of the echocardiogram personally ED records primary care physician records greater than 1 hour was spent in this patient interaction.  Next appointment in 1 month   Medication Adjustments/Labs and Tests Ordered: Current medicines are reviewed at  length with the patient today.  Concerns regarding medicines are outlined above.  Orders Placed This Encounter  Procedures  . Ambulatory referral to ENT  . MYOCARDIAL PERFUSION IMAGING  . LONG TERM MONITOR (3-14 DAYS)  . ECHOCARDIOGRAM COMPLETE   Meds ordered this encounter  Medications  . rosuvastatin (CRESTOR) 20 MG tablet    Sig: Take 1 tablet (20 mg total) by mouth daily.    Dispense:  30 tablet    Refill:  3     Chief Complaint  Patient presents with  . Follow-up    abn echo  . Loss of Consciousness    and ED visit  . Hyperlipidemia  . Dizziness    History of Present Illness:    Nathan [name] is a 73 y.o. male who is being seen today for the evaluation of an abnormal Echo at the request of Kuneff, [place], DO.  I personally reviewed his echocardiogram last evening I feel he is artifact as the abnormality is inconsistent not seen on all images and he has no evidence of underlying akinesia associated typically with thrombus.  Unfortunately he was not given contrast during the test.  He was seen in the ED 02/03/2019 for orthostatic dizziness and near syncope,  Echo 01/26/2019:  1. The left ventricle has normal systolic function, with an ejection fraction of 55-60%. The cavity size was normal. Left ventricular diastolic parameters were normal.  2. Echodensity in the LV apex, seen on multiple views is suggestive of apical thrombus. Clinical correlation suggested. May benefit from cardiac MRI for  further evaluation.  3. The right ventricle has normal systolic function. The cavity was normal. There is no increase in right ventricular wall thickness.  4. The mitral valve is grossly normal.  5. The tricuspid valve is grossly normal.  There is a very complicated visit encompassing several issues  1 is the echocardiogram I reviewed the image he does not have any apical akinesia I think what he has is artifact and unfortunately he now has an abnormal test result and will come  back and have a contrast assisted echocardiogram.  I would not initiate anticoagulation.  His second issue is hypertension he takes a combined calcium channel blocker ACE inhibitor I was concerned he had orthostatic hypotension but he has no shifting blood pressure 132/60 in my office with postural blood pressure checks  He has a background history of symptoms that sounds much like positional vertigo.  Recently has had several episodes that are more severe are associated with shifting posture rather than head movement and one was associated with brief loss of consciousness that his wife witnessed resulting in his emergency room visit he has had no palpitation chest pain shortness of breath edema but is very apprehensive about underlying heart disease and as part of his evaluation he had a carotid duplex showing mild to moderate 60% right internal carotid artery stenosis.  Fortunately he initiated on a statin.  He is having side effects from atorvastatin with constipation and will take this is an opportunity to transition to another high intensity statin rosuvastatin in view of his severe dyslipidemia.  He has no known history of congenital rheumatic heart disease.  Past Medical History:  Diagnosis Date  . Adrenal mass ([place]) 2018   Urology fully evaluated; benign  . Allergy    hay fever  . Anxiety   . Cataract   . Complication of anesthesia    hypoglycemia, low blood pressure, pt. reports it dropped to zero- post op MORPHINE , then taken ICU due to either the morphine or hypoglycemia . In addition post op from cataract surgery- 07/2015, experienced an episode of hypoglycemia    . Depressive disorder, not elsewhere classified   . Diverticulosis   . Elevated prostate specific antigen (PSA) 2008   Dr Jeffie Pollock  . Gallstone   . H/O cardiovascular stress test >10 yrs.   told that its wnl. Told to drink less coffee  . Hematuria 2018   Hematuria with enlarged prostate, kidney cyst, adrenal adenoma-->  followed by urology with cystoscopy and repeat CT, benign workup.  [name] Hx of migraines   . Hypertension   . Hypoglycemia   . IBS (irritable bowel syndrome)   . Inguinal hernia 12/2015   Bilateral small indirect hernias on CT, umbilical hernia also present.  . Kidney cyst, acquired 2018   Followed by urology, benign.  . Macular degeneration (senile) of retina, unspecified    2 different opinions from 2 different Ophth  . Osteoarthrosis, unspecified whether generalized or localized, unspecified site   . Other and unspecified hyperlipidemia   . Prostatitis 2008  . Refusal of blood transfusions as patient is Jehovah's Witness   . Spondylosis of lumbar spine   . 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    Past Surgical History:  Procedure Laterality Date  . CATARACT EXTRACTION, BILATERAL    . COLONOSCOPY  2012   Dr Olevia Perches  . CYSTOSCOPY     for hematuria  . hydrocoelectomy  2003   Dr  Wrenn  . KNEE ARTHROSCOPY Left 1994  . prostate nodule resection  2003  . TONSILLECTOMY    . TOTAL KNEE ARTHROPLASTY  2009  . TOTAL KNEE ARTHROPLASTY Right 06/27/2016   Procedure: TOTAL KNEE ARTHROPLASTY;  Surgeon: Leandrew Koyanagi, MD;  Location: [facility];  Service: Orthopedics;  Laterality: Right;    Current Medications: Current Meds  Medication Sig  . amLODipine (NORVASC) 10 MG tablet Take 1 tablet (10 mg total) by mouth daily.  [name] aspirin EC 81 MG tablet Take 81 mg by mouth daily.  [name] b complex vitamins tablet Take 1 tablet by mouth every other day.  . Cholecalciferol (VITAMIN D-3) 25 MCG (1000 UT) CAPS Take 1,000 Units by mouth daily with breakfast.  . lisinopril (PRINIVIL,ZESTRIL) 20 MG tablet Take 1 tablet (20 mg total) by mouth daily. Needs office visit prior to any additional refills  . MULTIPLE VITAMIN PO Take 1 tablet by mouth daily.  . Vitamin A 2250 MCG (7500 UT) CAPS Take 7,500 Units by mouth daily.   . vitamin C (ASCORBIC ACID) 500 MG tablet Take 1,000 mg by mouth  daily.   . [DISCONTINUED] atorvastatin (LIPITOR) 40 MG tablet Take 1 tablet (40 mg total) by mouth daily.     Allergies:   Morphine and related, Citalopram hydrobromide, and Sertraline hcl   Social History   Socioeconomic History  . Marital status: Married    Spouse name: Not on file  . Number of children: Not on file  . Years of education: Not on file  . Highest education level: Not on file  Occupational History  . Not on file  Social Needs  . Financial resource strain: Not on file  . Food insecurity    Worry: Not on file    Inability: Not on file  . Transportation needs    Medical: Not on file    Non-medical: Not on file  Tobacco Use  . Smoking status: Former Smoker    Quit date: 07/15/1968    Years since quitting: 50.5  . Smokeless tobacco: Never Used  . Tobacco comment: smoked [place], up to 1ppd  Substance and Sexual Activity  . Alcohol use: Not Currently  . Drug use: No  . Sexual activity: Yes    Partners: Female    Birth control/protection: None  Lifestyle  . Physical activity    Days per week: Not on file    Minutes per session: Not on file  . Stress: Not on file  Relationships  . Social Herbalist on phone: Not on file    Gets together: Not on file    Attends religious service: Not on file    Active member of club or organization: Not on file    Attends meetings of clubs or organizations: Not on file    Relationship status: Not on file  Other Topics Concern  . Not on file  Social History Narrative   Married. Wife's name is [name]. Full time employed, Pensions consultant.   Drinks caffeinated beverages. Take a multivitamin. Wears his seatbelt.   Exercises at least 3 times a week. Is not a strict vegetarian, but only eats meat 1-2 times a week   Wears a hearing aid. Does not wear dentures or use an assistive walking device.   There is a smoke detector in his home.   He feels safe in his relationships.     Family History: The  patient's family history includes Alcohol abuse in his  father; Diabetes in an other family member; Early death in his father; Heart attack in his brother; Heart disease in his father and mother; Lung cancer in his father; Tuberculosis in his father. There is no history of Stroke.  ROS:   Review of Systems  Constitution: Negative.  HENT: Negative.   Eyes: Negative.   Cardiovascular: Positive for near-syncope and syncope.  Respiratory: Negative.   Endocrine: Negative.   Hematologic/Lymphatic: Negative.   Skin: Negative.   Musculoskeletal: Negative.   Gastrointestinal: Negative.   Neurological: Positive for dizziness and vertigo.  Psychiatric/Behavioral: Negative.   Allergic/Immunologic: Negative.    Please see the history of present illness.     All other systems reviewed and are negative.  EKGs/Labs/Other Studies Reviewed:    The following studies were reviewed today: EKG 2019-02-08 with [facility] 1 PVC and non specific ST abnormality Recent Labs: 01/11/2019: ALT 16; TSH 1.39 02/08/19: BUN 12; Creatinine, Ser 1.25; Hemoglobin 13.7; Platelets 252; Potassium 3.5; Sodium 137  Recent Lipid Panel    Component Value Date/Time   CHOL 290 (H) 04/20/2018 1010   TRIG 120 04/20/2018 1010   TRIG 75 07/02/2006 1146   HDL 65 04/20/2018 1010   CHOLHDL 4.5 04/20/2018 1010   VLDL 21.0 03/03/2014 1150   LDLCALC 199 (H) 04/20/2018 1010   LDLDIRECT 207.3 02/22/2013 1136    Physical Exam:    VS:  BP 108/70 (BP Location: Left Arm, Patient Position: Sitting, Cuff Size: Normal)   Pulse 62   Temp 97.6 F (36.4 C)   Ht 5' 10.5" (1.791 m)   Wt 195 lb 12.8 oz (88.8 kg)   SpO2 98%   BMI 27.70 kg/m     Wt Readings from Last 3 Encounters:  02/05/19 195 lb 12.8 oz (88.8 kg)  02-08-2019 191 lb (86.6 kg)  01/11/19 196 lb 8 oz (89.1 kg)     GEN:  Well nourished, well developed in no acute distress HEENT: Normal NECK: No JVD; No carotid bruits LYMPHATICS: No lymphadenopathy CARDIAC: RRR, no  murmurs, rubs, gallops RESPIRATORY:  Clear to auscultation without rales, wheezing or rhonchi  ABDOMEN: Soft, non-tender, non-distended MUSCULOSKELETAL:  No edema; No deformity  SKIN: Warm and dry NEUROLOGIC:  Alert and oriented x 3 PSYCHIATRIC:  Normal affect     Signed, Shirlee More, MD  02/05/2019 11:55 AM    [place]

## 2019-02-05 ENCOUNTER — Ambulatory Visit (INDEPENDENT_AMBULATORY_CARE_PROVIDER_SITE_OTHER): Payer: Medicare Other | Admitting: Cardiology

## 2019-02-05 ENCOUNTER — Encounter: Payer: Self-pay | Admitting: Cardiology

## 2019-02-05 ENCOUNTER — Encounter: Payer: Self-pay | Admitting: *Deleted

## 2019-02-05 ENCOUNTER — Other Ambulatory Visit: Payer: Self-pay

## 2019-02-05 VITALS — BP 108/70 | HR 62 | Temp 97.6°F | Ht 70.5 in | Wt 195.8 lb

## 2019-02-05 DIAGNOSIS — I10 Essential (primary) hypertension: Secondary | ICD-10-CM

## 2019-02-05 DIAGNOSIS — R079 Chest pain, unspecified: Secondary | ICD-10-CM

## 2019-02-05 DIAGNOSIS — R55 Syncope and collapse: Secondary | ICD-10-CM | POA: Diagnosis not present

## 2019-02-05 DIAGNOSIS — R931 Abnormal findings on diagnostic imaging of heart and coronary circulation: Secondary | ICD-10-CM | POA: Insufficient documentation

## 2019-02-05 DIAGNOSIS — R42 Dizziness and giddiness: Secondary | ICD-10-CM

## 2019-02-05 DIAGNOSIS — E7800 Pure hypercholesterolemia, unspecified: Secondary | ICD-10-CM

## 2019-02-05 MED ORDER — ROSUVASTATIN CALCIUM 20 MG PO TABS: 20.0000 mg | ORAL_TABLET | Freq: Every day | ORAL | 3 refills | Status: DC

## 2019-02-05 NOTE — Patient Instructions (Addendum)
Medication Instructions:  Your physician has recommended you make the following change in your medication:   STOP : Atorvastatin  START: Rosuvastatin (Crestor) 20 mg : Take 1 tab daily in the evening  If you need a refill on your cardiac medications before your next appointment, please call your pharmacy.   Lab work: None If you have labs (blood work) drawn today and your tests are completely normal, you will receive your results only by: [name] MyChart Message (if you have MyChart) OR . A paper copy in the mail If you have any lab test that is abnormal or we need to change your treatment, we will call you to review the results.  Testing/Procedures: Your physician has requested that you have an echocardiogram. Echocardiography is a painless test that uses sound waves to create images of your heart. It provides your doctor with information about the size and shape of your heart and how well your heart's chambers and valves are working. This procedure takes approximately one hour. There are no restrictions for this procedure.  Your physician has recommended that you wear a ZIO monitor. ZIO monitors are medical devices that record the heart's electrical activity. Doctors most often use these monitors to diagnose arrhythmias. Arrhythmias are problems with the speed or rhythm of the heartbeat. The monitor is a small, portable device. You can wear one while you do your normal daily activities. This is usually used to diagnose what is causing palpitations/syncope (passing out).  Wear 14 days  Your physician has requested that you have a lexiscan myoview. For further information please visit [email or web address]. Please follow instruction sheet, as given.    Follow-Up: At [facility], you and your health needs are our priority.  As part of our continuing mission to provide you with exceptional heart care, we have created designated Provider Care Teams.  These Care Teams include your primary  Cardiologist (physician) and Advanced Practice Providers (APPs -  Physician Assistants and Nurse Practitioners) who all work together to provide you with the care you need, when you need it. You will need a follow up appointment in 6 weeks.   Any Other Special Instructions Will Be Listed Below (If Applicable).   Cardiac Nuclear Scan A cardiac nuclear scan is a test that is done to check the flow of blood to your heart. It is done when you are resting and when you are exercising. The test looks for problems such as:  Not enough blood reaching a portion of the heart.  The heart muscle not working as it should. You may need this test if:  You have heart disease.  You have had lab results that are not normal.  You have had heart surgery or a balloon procedure to open up blocked arteries (angioplasty).  You have chest pain.  You have shortness of breath. In this test, a special dye (tracer) is put into your bloodstream. The tracer will travel to your heart. A camera will then take pictures of your heart to see how the tracer moves through your heart. This test is usually done at a hospital and takes 2-4 hours. Tell a doctor about:  Any allergies you have.  All medicines you are taking, including vitamins, herbs, eye drops, creams, and over-the-counter medicines.  Any problems you or family members have had with anesthetic medicines.  Any blood disorders you have.  Any surgeries you have had.  Any medical conditions you have.  Whether you are pregnant or may be pregnant. What are the  risks? Generally, this is a safe test. However, problems may occur, such as:  Serious chest pain and heart attack. This is only a risk if the stress portion of the test is done.  Rapid heartbeat.  A feeling of warmth in your chest. This feeling usually does not last long.  Allergic reaction to the tracer. What happens before the test?  Ask your doctor about changing or stopping your normal  medicines. This is important.  Follow instructions from your doctor about what you cannot eat or drink.  Remove your jewelry on the day of the test. What happens during the test?  An IV tube will be inserted into one of your veins.  Your doctor will give you a small amount of tracer through the IV tube.  You will wait for 20-40 minutes while the tracer moves through your bloodstream.  Your heart will be monitored with an electrocardiogram (ECG).  You will lie down on an exam table.  Pictures of your heart will be taken for about 15-20 minutes.  You may also have a stress test. For this test, one of these things may be done: ? You will be asked to exercise on a treadmill or a stationary bike. ? You will be given medicines that will make your heart work harder. This is done if you are unable to exercise.  When blood flow to your heart has peaked, a tracer will again be given through the IV tube.  After 20-40 minutes, you will get back on the exam table. More pictures will be taken of your heart.  Depending on the tracer that is used, more pictures may need to be taken 3-4 hours later.  Your IV tube will be removed when the test is over. The test may vary among doctors and hospitals. What happens after the test?  Ask your doctor: ? Whether you can return to your normal schedule, including diet, activities, and medicines. ? Whether you should drink more fluids. This will help to remove the tracer from your body. Drink enough fluid to keep your pee (urine) pale yellow.  Ask your doctor, or the department that is doing the test: ? When will my results be ready? ? How will I get my results? Summary  A cardiac nuclear scan is a test that is done to check the flow of blood to your heart.  Tell your doctor whether you are pregnant or may be pregnant.  Before the test, ask your doctor about changing or stopping your normal medicines. This is important.  Ask your doctor whether  you can return to your normal activities. You may be asked to drink more fluids. This information is not intended to replace advice given to you by your health care provider. Make sure you discuss any questions you have with your health care provider. Document Released: 12/15/2017 Document Revised: 10/21/2018 Document Reviewed: 12/15/2017 Elsevier Patient Education  [place].  Echocardiogram An echocardiogram is a procedure that uses painless sound waves (ultrasound) to produce an image of the heart. Images from an echocardiogram can provide important information about:  Signs of coronary artery disease (CAD).  Aneurysm detection. An aneurysm is a weak or damaged part of an artery wall that bulges out from the normal force of blood pumping through the body.  Heart size and shape. Changes in the size or shape of the heart can be associated with certain conditions, including heart failure, aneurysm, and CAD.  Heart muscle function.  Heart valve function.  Signs of a past heart attack.  Fluid buildup around the heart.  Thickening of the heart muscle.  A tumor or infectious growth around the heart valves. Tell a health care provider about:  Any allergies you have.  All medicines you are taking, including vitamins, herbs, eye drops, creams, and over-the-counter medicines.  Any blood disorders you have.  Any surgeries you have had.  Any medical conditions you have.  Whether you are pregnant or may be pregnant. What are the risks? Generally, this is a safe procedure. However, problems may occur, including:  Allergic reaction to dye (contrast) that may be used during the procedure. What happens before the procedure? No specific preparation is needed. You may eat and drink normally. What happens during the procedure?   An IV tube may be inserted into one of your veins.  You may receive contrast through this tube. A contrast is an injection that improves the quality  of the pictures from your heart.  A gel will be applied to your chest.  A wand-like tool (transducer) will be moved over your chest. The gel will help to transmit the sound waves from the transducer.  The sound waves will harmlessly bounce off of your heart to allow the heart images to be captured in real-time motion. The images will be recorded on a computer. The procedure may vary among health care providers and hospitals. What happens after the procedure?  You may return to your normal, everyday life, including diet, activities, and medicines, unless your health care provider tells you not to do that. Summary  An echocardiogram is a procedure that uses painless sound waves (ultrasound) to produce an image of the heart.  Images from an echocardiogram can provide important information about the size and shape of your heart, heart muscle function, heart valve function, and fluid buildup around your heart.  You do not need to do anything to prepare before this procedure. You may eat and drink normally.  After the echocardiogram is completed, you may return to your normal, everyday life, unless your health care provider tells you not to do that. This information is not intended to replace advice given to you by your health care provider. Make sure you discuss any questions you have with your health care provider. Document Released: 06/28/2000 Document Revised: 10/22/2018 Document Reviewed: 08/03/2016 Elsevier Patient Education  2020 [employer].

## 2019-02-11 ENCOUNTER — Ambulatory Visit (HOSPITAL_BASED_OUTPATIENT_CLINIC_OR_DEPARTMENT_OTHER)
Admission: RE | Admit: 2019-02-11 | Discharge: 2019-02-11 | Disposition: A | Discharge status: Home / Self Care | Payer: Medicare Other | Source: Ambulatory Visit | Attending: Cardiology | Admitting: Cardiology

## 2019-02-11 ENCOUNTER — Other Ambulatory Visit: Payer: Self-pay

## 2019-02-11 DIAGNOSIS — R931 Abnormal findings on diagnostic imaging of heart and coronary circulation: Secondary | ICD-10-CM | POA: Insufficient documentation

## 2019-02-11 DIAGNOSIS — G459 Transient cerebral ischemic attack, unspecified: Secondary | ICD-10-CM

## 2019-02-11 MED ORDER — PERFLUTREN LIPID MICROSPHERE
1.0000 mL | INTRAVENOUS | Status: AC | PRN
  Administered 2019-02-11: 3 mL via INTRAVENOUS
  Filled 2019-02-11: qty 10

## 2019-02-11 NOTE — Progress Notes (Signed)
  Echocardiogram 2D Echocardiogram has been performed.  Cardell Peach 02/11/2019, 4:13 PM

## 2019-02-12 ENCOUNTER — Other Ambulatory Visit (INDEPENDENT_AMBULATORY_CARE_PROVIDER_SITE_OTHER): Payer: Medicare Other

## 2019-02-12 ENCOUNTER — Telehealth (HOSPITAL_COMMUNITY): Payer: Self-pay | Admitting: *Deleted

## 2019-02-12 DIAGNOSIS — R42 Dizziness and giddiness: Secondary | ICD-10-CM | POA: Diagnosis not present

## 2019-02-12 NOTE — Telephone Encounter (Signed)
Left message on voicemail per DPR in reference to upcoming appointment scheduled on 02/16/19 at 10:45 with detailed instructions given per Myocardial Perfusion Study Information Sheet for the test. LM to arrive 15 minutes early, and that it is imperative to arrive on time for appointment to keep from having the test rescheduled. If you need to cancel or reschedule your appointment, please call the office within 24 hours of your appointment. Failure to do so may result in a cancellation of your appointment, and a $50 no show fee. Phone number given for call back for any questions.

## 2019-02-15 ENCOUNTER — Telehealth: Payer: Self-pay | Admitting: *Deleted

## 2019-02-15 ENCOUNTER — Encounter (HOSPITAL_COMMUNITY): Payer: Medicare Other

## 2019-02-15 NOTE — Telephone Encounter (Signed)
Left message to call back regarding ech

## 2019-02-15 NOTE — Telephone Encounter (Signed)
-----   Message from Richardo Priest, MD sent at 02/11/2019  5:11 PM EDT ----- Normal or stable result  Please tell him this is a good report heart muscle function is normal thus much better images with the contrast administered and there is no thrombus he does not require anticoagulants.

## 2019-02-16 ENCOUNTER — Ambulatory Visit (HOSPITAL_COMMUNITY): Payer: Medicare Other | Attending: Cardiology

## 2019-02-16 ENCOUNTER — Other Ambulatory Visit: Payer: Self-pay

## 2019-02-16 VITALS — Ht 70.5 in | Wt 195.0 lb

## 2019-02-16 DIAGNOSIS — R079 Chest pain, unspecified: Secondary | ICD-10-CM | POA: Diagnosis not present

## 2019-02-16 LAB — MYOCARDIAL PERFUSION IMAGING
LV dias vol: 109 mL (ref 62–150)
LV sys vol: 49 mL
Peak HR: 68 {beats}/min
Rest HR: 53 {beats}/min
SDS: 1
SRS: 0
SSS: 1
TID: 1.06

## 2019-02-16 MED ORDER — REGADENOSON 0.4 MG/5ML IV SOLN
0.4000 mg | Freq: Once | INTRAVENOUS | Status: AC
  Administered 2019-02-16: 0.4 mg via INTRAVENOUS

## 2019-02-16 MED ORDER — TECHNETIUM TC 99M TETROFOSMIN IV KIT
32.7000 | PACK | Freq: Once | INTRAVENOUS | Status: AC | PRN
  Administered 2019-02-16: 32.7 via INTRAVENOUS
  Filled 2019-02-16: qty 33

## 2019-02-16 MED ORDER — TECHNETIUM TC 99M TETROFOSMIN IV KIT
11.0000 | PACK | Freq: Once | INTRAVENOUS | Status: AC | PRN
  Administered 2019-02-16: 11 via INTRAVENOUS
  Filled 2019-02-16: qty 11

## 2019-02-16 NOTE — Telephone Encounter (Signed)
Patient returned call. Informed off echo results.

## 2019-02-21 ENCOUNTER — Encounter: Payer: Self-pay | Admitting: Family Medicine

## 2019-02-26 ENCOUNTER — Ambulatory Visit: Payer: Medicare Other | Admitting: Family Medicine

## 2019-03-03 DIAGNOSIS — L570 Actinic keratosis: Secondary | ICD-10-CM | POA: Diagnosis not present

## 2019-03-03 DIAGNOSIS — L820 Inflamed seborrheic keratosis: Secondary | ICD-10-CM | POA: Diagnosis not present

## 2019-03-03 DIAGNOSIS — L720 Epidermal cyst: Secondary | ICD-10-CM | POA: Diagnosis not present

## 2019-03-03 DIAGNOSIS — D225 Melanocytic nevi of trunk: Secondary | ICD-10-CM | POA: Diagnosis not present

## 2019-03-04 DIAGNOSIS — R42 Dizziness and giddiness: Secondary | ICD-10-CM | POA: Diagnosis not present

## 2019-03-24 NOTE — Progress Notes (Deleted)
Cardiology Office Note:    Date:  03/24/2019   ID:  Nathan [name], DOB 02/09/1946, MRN [id number]  PCP:  Nathan Hillock, DO  Cardiologist:  Nathan More, MD    Referring MD: Nathan Pouch A, DO    ASSESSMENT:    No diagnosis found. PLAN:    In order of problems listed above:  1. ***   Next appointment: ***   Medication Adjustments/Labs and Tests Ordered: Current medicines are reviewed at length with the patient today.  Concerns regarding medicines are outlined above.  No orders of the defined types were placed in this encounter.  No orders of the defined types were placed in this encounter.   No chief complaint on file.   History of Present Illness:    Nathan [name] is [name] 73 y.o. male with [name] hx of syncope and an abnormal echo last seen 02/05/2019. Compliance with diet, lifestyle and medications: ***  Echo 01/26/2019: 1. The left ventricle has normal systolic function, with an ejection fraction of 55-60%. The cavity size was normal. Left ventricular diastolic parameters were normal. 2. Echodensity in the LV apex, seen on multiple views is suggestive of apical thrombus. Clinical correlation suggested. May benefit from cardiac MRI for further evaluation. 3. The right ventricle has normal systolic function. The cavity was normal. There is no increase in right ventricular wall thickness. 4. The mitral valve is grossly normal. 5. The tricuspid valve is grossly normal  Limited echo 02/11/2019 with contrast:  1. The left ventricle has normal systolic function, with an ejection fraction of 60-65%. The cavity size was normal. There is mildly increased left ventricular wall thickness. Left ventricular diastolic parameters were normal.  2. The right ventricle has normal systolc function. The cavity was mildly enlarged. There is no increase in right ventricular wall thickness. Right ventricular systolic pressure is normal with an estimated pressure of 28.0 mmHg.  3. Aortic  valve regurgitation was not assessed by color flow Doppler.  4. Pulmonic valve regurgitation was not assessed by color flow Doppler.  MPI 02/16/2019: Study Highlights    The left ventricular ejection fraction is normal (55-65%).  Nuclear stress EF: 55%.  There was no ST segment deviation noted during stress.  Defect 1: There is [name] small defect of mild severity present in the apex location.  The study is normal.  This is [name] low risk study. Normal stress nuclear study with mild apical thinning but no ischemia.  Gated ejection fraction 55% with normal wall motion.   Zio monitor: Study Highlights  [name] ZIO monitor was performed for 13 days and 11 hours beginning 02/12/2019 to evaluate syncope with suspected bradycardia. The rhythm throughout was sinus with minimum, average and maximum heart rates of 41, 60 and 120 bpm.  The minimum heart rate was sinus bradycardia that occurred at approximately 1 AM. There were no pauses of 3 seconds or greater and no episodes of sinus node or AV nodal block. There were no triggered or diary events. Ventricular arrhythmia was rare with isolated PVCs and couplets.  The longest episode of bigeminy 10 seconds the longest episode of trigeminy 13 seconds. Supraventricular arrhythmia was rare without atrial fibrillation or flutter.  31 short episodes of paroxysmal atrial tachycardia were present the longest 13 complexes at [name] rate of 104 bpm and the fastest 10 complexes at [name] rate of 166 bpm.  These were asymptomatic.  Conclusion 14-day event monitor without significant bradycardia.  The incidence of ventricular and supraventricular arrhythmia was rare  with brief episodes of atrial tachycardia.    Past Medical History:  Diagnosis Date  . Adrenal mass ([place]) 2018   Urology fully evaluated; benign  . Allergy    hay fever  . Anxiety   . Cataract   . Complication of anesthesia    hypoglycemia, low blood pressure, pt. reports it dropped to zero- post op MORPHINE  , then taken ICU due to either the morphine or hypoglycemia . In addition post op from cataract surgery- 07/2015, experienced an episode of hypoglycemia    . Depressive disorder, not elsewhere classified   . Diverticulosis   . Elevated prostate specific antigen (PSA) 2008   Dr Nathan [name]  . Gallstone   . H/O cardiovascular stress test >10 yrs.   told that its wnl. Told to drink less coffee  . Hematuria 2018   Hematuria with enlarged prostate, kidney cyst, adrenal adenoma--> followed by urology with cystoscopy and repeat CT, benign workup.  [name] Hx of migraines   . Hypertension   . Hypoglycemia   . IBS (irritable bowel syndrome)   . Inguinal hernia 12/2015   Bilateral small indirect hernias on CT, umbilical hernia also present.  . Kidney cyst, acquired 2018   Followed by urology, benign.  . Macular degeneration (senile) of retina, unspecified    2 different opinions from 2 different Ophth  . Osteoarthrosis, unspecified whether generalized or localized, unspecified site   . Other and unspecified hyperlipidemia   . Prostatitis 2008  . Refusal of blood transfusions as patient is Jehovah's Witness   . Spondylosis of lumbar spine   . 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    Past Surgical History:  Procedure Laterality Date  . CATARACT EXTRACTION, BILATERAL    . COLONOSCOPY  2012   Dr Olevia Perches  . CYSTOSCOPY     for hematuria  . hydrocoelectomy  2003   Dr Nathan [name]  . KNEE ARTHROSCOPY Left 1994  . prostate nodule resection  2003  . TONSILLECTOMY    . TOTAL KNEE ARTHROPLASTY  2009  . TOTAL KNEE ARTHROPLASTY Right 06/27/2016   Procedure: TOTAL KNEE ARTHROPLASTY;  Surgeon: Nathan Koyanagi, MD;  Location: [facility];  Service: Orthopedics;  Laterality: Right;    Current Medications: No outpatient medications have been marked as taking for the 03/25/19 encounter (Appointment) with Richardo Priest, MD.     Allergies:   Morphine and related, Citalopram hydrobromide, and  Sertraline hcl   Social History   Socioeconomic History  . Marital status: Married    Spouse name: Not on file  . Number of children: Not on file  . Years of education: Not on file  . Highest education level: Not on file  Occupational History  . Not on file  Social Needs  . Financial resource strain: Not on file  . Food insecurity    Worry: Not on file    Inability: Not on file  . Transportation needs    Medical: Not on file    Non-medical: Not on file  Tobacco Use  . Smoking status: Former Smoker    Quit date: 07/15/1968    Years since quitting: 50.7  . Smokeless tobacco: Never Used  . Tobacco comment: smoked [place], up to 1ppd  Substance and Sexual Activity  . Alcohol use: Not Currently  . Drug use: No  . Sexual activity: Yes    Partners: Female    Birth control/protection: None  Lifestyle  . Physical  activity    Days per week: Not on file    Minutes per session: Not on file  . Stress: Not on file  Relationships  . Social Herbalist on phone: Not on file    Gets together: Not on file    Attends religious service: Not on file    Active member of club or organization: Not on file    Attends meetings of clubs or organizations: Not on file    Relationship status: Not on file  Other Topics Concern  . Not on file  Social History Narrative   Married. Wife's name is [name]. Full time employed, Pensions consultant.   Drinks caffeinated beverages. Take [name] multivitamin. Wears his seatbelt.   Exercises at least 3 times [name] week. Is not [name] strict vegetarian, but only eats meat 1-2 times [name] week   Wears [name] hearing aid. Does not wear dentures or use an assistive walking device.   There is [name] smoke detector in his home.   He feels safe in his relationships.     Family History: The patient's ***family history includes Alcohol abuse in his father; Diabetes in an other family member; Early death in his father; Heart attack in his brother; Heart disease in his  father and mother; Lung cancer in his father; Tuberculosis in his father. There is no history of Stroke. ROS:   Please see the history of present illness.    All other systems reviewed and are negative.  EKGs/Labs/Other Studies Reviewed:    The following studies were reviewed today:  EKG:  EKG ordered today and personally reviewed.  The ekg ordered today demonstrates ***  Recent Labs: 01/11/2019: ALT 16; TSH 1.39 02/03/2019: BUN 12; Creatinine, Ser 1.25; Hemoglobin 13.7; Platelets 252; Potassium 3.5; Sodium 137  Recent Lipid Panel    Component Value Date/Time   CHOL 290 (H) 04/20/2018 1010   TRIG 120 04/20/2018 1010   TRIG 75 07/02/2006 1146   HDL 65 04/20/2018 1010   CHOLHDL 4.5 04/20/2018 1010   VLDL 21.0 03/03/2014 1150   LDLCALC 199 (H) 04/20/2018 1010   LDLDIRECT 207.3 02/22/2013 1136    Physical Exam:    VS:  There were no vitals taken for this visit.    Wt Readings from Last 3 Encounters:  02/16/19 195 lb (88.5 kg)  02/05/19 195 lb 12.8 oz (88.8 kg)  02/03/19 191 lb (86.6 kg)     GEN: *** Well nourished, well developed in no acute distress HEENT: Normal NECK: No JVD; No carotid bruits LYMPHATICS: No lymphadenopathy CARDIAC: ***RRR, no murmurs, rubs, gallops RESPIRATORY:  Clear to auscultation without rales, wheezing or rhonchi  ABDOMEN: Soft, non-tender, non-distended MUSCULOSKELETAL:  No edema; No deformity  SKIN: Warm and dry NEUROLOGIC:  Alert and oriented x 3 PSYCHIATRIC:  Normal affect    Signed, Nathan More, MD  03/24/2019 5:43 PM    [facility]

## 2019-03-25 ENCOUNTER — Ambulatory Visit: Payer: Medicare Other | Admitting: Cardiology

## 2019-04-02 DIAGNOSIS — M9903 Segmental and somatic dysfunction of lumbar region: Secondary | ICD-10-CM | POA: Diagnosis not present

## 2019-04-02 DIAGNOSIS — M545 Low back pain: Secondary | ICD-10-CM | POA: Diagnosis not present

## 2019-04-02 DIAGNOSIS — M9904 Segmental and somatic dysfunction of sacral region: Secondary | ICD-10-CM | POA: Diagnosis not present

## 2019-04-02 DIAGNOSIS — S39013A Strain of muscle, fascia and tendon of pelvis, initial encounter: Secondary | ICD-10-CM | POA: Diagnosis not present

## 2019-04-05 DIAGNOSIS — M545 Low back pain: Secondary | ICD-10-CM | POA: Diagnosis not present

## 2019-04-05 DIAGNOSIS — M9903 Segmental and somatic dysfunction of lumbar region: Secondary | ICD-10-CM | POA: Diagnosis not present

## 2019-04-05 DIAGNOSIS — M9904 Segmental and somatic dysfunction of sacral region: Secondary | ICD-10-CM | POA: Diagnosis not present

## 2019-04-05 DIAGNOSIS — S39013A Strain of muscle, fascia and tendon of pelvis, initial encounter: Secondary | ICD-10-CM | POA: Diagnosis not present

## 2019-04-07 DIAGNOSIS — M9903 Segmental and somatic dysfunction of lumbar region: Secondary | ICD-10-CM | POA: Diagnosis not present

## 2019-04-07 DIAGNOSIS — M545 Low back pain: Secondary | ICD-10-CM | POA: Diagnosis not present

## 2019-04-07 DIAGNOSIS — M9904 Segmental and somatic dysfunction of sacral region: Secondary | ICD-10-CM | POA: Diagnosis not present

## 2019-04-07 DIAGNOSIS — S39013A Strain of muscle, fascia and tendon of pelvis, initial encounter: Secondary | ICD-10-CM | POA: Diagnosis not present

## 2019-04-15 ENCOUNTER — Ambulatory Visit: Payer: Medicare Other | Admitting: Cardiology

## 2019-04-15 DIAGNOSIS — M9903 Segmental and somatic dysfunction of lumbar region: Secondary | ICD-10-CM | POA: Diagnosis not present

## 2019-04-15 DIAGNOSIS — M9904 Segmental and somatic dysfunction of sacral region: Secondary | ICD-10-CM | POA: Diagnosis not present

## 2019-04-15 DIAGNOSIS — M545 Low back pain: Secondary | ICD-10-CM | POA: Diagnosis not present

## 2019-04-15 DIAGNOSIS — S39013A Strain of muscle, fascia and tendon of pelvis, initial encounter: Secondary | ICD-10-CM | POA: Diagnosis not present

## 2019-04-16 DIAGNOSIS — S39013A Strain of muscle, fascia and tendon of pelvis, initial encounter: Secondary | ICD-10-CM | POA: Diagnosis not present

## 2019-04-16 DIAGNOSIS — M9903 Segmental and somatic dysfunction of lumbar region: Secondary | ICD-10-CM | POA: Diagnosis not present

## 2019-04-16 DIAGNOSIS — M545 Low back pain: Secondary | ICD-10-CM | POA: Diagnosis not present

## 2019-04-16 DIAGNOSIS — M9904 Segmental and somatic dysfunction of sacral region: Secondary | ICD-10-CM | POA: Diagnosis not present

## 2019-04-19 DIAGNOSIS — M9904 Segmental and somatic dysfunction of sacral region: Secondary | ICD-10-CM | POA: Diagnosis not present

## 2019-04-19 DIAGNOSIS — M9903 Segmental and somatic dysfunction of lumbar region: Secondary | ICD-10-CM | POA: Diagnosis not present

## 2019-04-19 DIAGNOSIS — S39013A Strain of muscle, fascia and tendon of pelvis, initial encounter: Secondary | ICD-10-CM | POA: Diagnosis not present

## 2019-04-19 DIAGNOSIS — M545 Low back pain: Secondary | ICD-10-CM | POA: Diagnosis not present

## 2019-04-21 DIAGNOSIS — M9903 Segmental and somatic dysfunction of lumbar region: Secondary | ICD-10-CM | POA: Diagnosis not present

## 2019-04-21 DIAGNOSIS — S39013A Strain of muscle, fascia and tendon of pelvis, initial encounter: Secondary | ICD-10-CM | POA: Diagnosis not present

## 2019-04-21 DIAGNOSIS — M9904 Segmental and somatic dysfunction of sacral region: Secondary | ICD-10-CM | POA: Diagnosis not present

## 2019-04-21 DIAGNOSIS — M545 Low back pain: Secondary | ICD-10-CM | POA: Diagnosis not present

## 2019-04-26 DIAGNOSIS — M9904 Segmental and somatic dysfunction of sacral region: Secondary | ICD-10-CM | POA: Diagnosis not present

## 2019-04-26 DIAGNOSIS — M545 Low back pain: Secondary | ICD-10-CM | POA: Diagnosis not present

## 2019-04-26 DIAGNOSIS — S39013A Strain of muscle, fascia and tendon of pelvis, initial encounter: Secondary | ICD-10-CM | POA: Diagnosis not present

## 2019-04-26 DIAGNOSIS — M9903 Segmental and somatic dysfunction of lumbar region: Secondary | ICD-10-CM | POA: Diagnosis not present

## 2019-04-28 DIAGNOSIS — M9903 Segmental and somatic dysfunction of lumbar region: Secondary | ICD-10-CM | POA: Diagnosis not present

## 2019-04-28 DIAGNOSIS — M545 Low back pain: Secondary | ICD-10-CM | POA: Diagnosis not present

## 2019-04-28 DIAGNOSIS — S39013A Strain of muscle, fascia and tendon of pelvis, initial encounter: Secondary | ICD-10-CM | POA: Diagnosis not present

## 2019-04-28 DIAGNOSIS — M9904 Segmental and somatic dysfunction of sacral region: Secondary | ICD-10-CM | POA: Diagnosis not present

## 2019-04-29 ENCOUNTER — Other Ambulatory Visit: Payer: Self-pay | Admitting: Cardiology

## 2019-05-03 DIAGNOSIS — M9904 Segmental and somatic dysfunction of sacral region: Secondary | ICD-10-CM | POA: Diagnosis not present

## 2019-05-03 DIAGNOSIS — M9903 Segmental and somatic dysfunction of lumbar region: Secondary | ICD-10-CM | POA: Diagnosis not present

## 2019-05-03 DIAGNOSIS — S39013A Strain of muscle, fascia and tendon of pelvis, initial encounter: Secondary | ICD-10-CM | POA: Diagnosis not present

## 2019-05-03 DIAGNOSIS — M545 Low back pain: Secondary | ICD-10-CM | POA: Diagnosis not present

## 2019-05-05 DIAGNOSIS — M9903 Segmental and somatic dysfunction of lumbar region: Secondary | ICD-10-CM | POA: Diagnosis not present

## 2019-05-05 DIAGNOSIS — M9904 Segmental and somatic dysfunction of sacral region: Secondary | ICD-10-CM | POA: Diagnosis not present

## 2019-05-05 DIAGNOSIS — M545 Low back pain: Secondary | ICD-10-CM | POA: Diagnosis not present

## 2019-05-05 DIAGNOSIS — S39013A Strain of muscle, fascia and tendon of pelvis, initial encounter: Secondary | ICD-10-CM | POA: Diagnosis not present

## 2019-05-10 DIAGNOSIS — M9904 Segmental and somatic dysfunction of sacral region: Secondary | ICD-10-CM | POA: Diagnosis not present

## 2019-05-10 DIAGNOSIS — S39013A Strain of muscle, fascia and tendon of pelvis, initial encounter: Secondary | ICD-10-CM | POA: Diagnosis not present

## 2019-05-10 DIAGNOSIS — M9903 Segmental and somatic dysfunction of lumbar region: Secondary | ICD-10-CM | POA: Diagnosis not present

## 2019-05-10 DIAGNOSIS — M545 Low back pain: Secondary | ICD-10-CM | POA: Diagnosis not present

## 2019-05-14 DIAGNOSIS — M545 Low back pain: Secondary | ICD-10-CM | POA: Diagnosis not present

## 2019-05-14 DIAGNOSIS — M9904 Segmental and somatic dysfunction of sacral region: Secondary | ICD-10-CM | POA: Diagnosis not present

## 2019-05-14 DIAGNOSIS — S39013A Strain of muscle, fascia and tendon of pelvis, initial encounter: Secondary | ICD-10-CM | POA: Diagnosis not present

## 2019-05-14 DIAGNOSIS — M9903 Segmental and somatic dysfunction of lumbar region: Secondary | ICD-10-CM | POA: Diagnosis not present

## 2019-05-28 ENCOUNTER — Ambulatory Visit: Payer: Self-pay

## 2019-05-28 ENCOUNTER — Other Ambulatory Visit: Payer: Self-pay

## 2019-05-28 ENCOUNTER — Ambulatory Visit: Payer: Medicare Other | Admitting: Family Medicine

## 2019-05-28 ENCOUNTER — Encounter: Payer: Self-pay | Admitting: Family Medicine

## 2019-05-28 DIAGNOSIS — M25561 Pain in right knee: Secondary | ICD-10-CM

## 2019-05-28 NOTE — Progress Notes (Signed)
Office Visit Note   Patient: Nathan [name]           Date of Birth: 10/04/1945           MRN: [id number] Visit Date: 05/28/2019 Requested by: Ma Hillock, DO [address] [place],  [place] [address] PCP: Ma Hillock, DO  Subjective: Chief Complaint  Patient presents with  . Right Knee - Pain    Fell 2 days ago while coming down outdoor front steps at home - landed on gravel path. Abrasions to knee and elbow. Some stiffness and swelling in the knee. WBAT with rolling walking. Right TKA 2017 Erlinda Hong)    HPI: He is here with right knee pain.  2 days ago he was coming down some steps outdoors, fell forward and landed directly on the front of his knee.  He thinks his knee bent underneath him.  Since then he has had difficulty bearing weight.  Minimal pain at rest.  He is status post right knee replacement in December 2017 per Dr. Erlinda Hong.  He was doing well until this.  Now he is using a walker for ambulation.              ROS: No fevers or chills.  All other systems were reviewed and are negative.  Objective: Vital Signs: There were no vitals taken for this visit.  Physical Exam:  General:  Alert and oriented, in no acute distress. Pulm:  Breathing unlabored. Psy:  Normal mood, congruent affect. Skin: Abrasion over the patella tendon, scab with no active bleeding. Right knee: Trace effusion with no warmth.  Full active extension, able to do a straight leg raise.  Flexion of little more than 90 degrees.  No significant laxity with varus or valgus stress.  He is tender to palpation of the medial tibial plateau and the medial femoral condyle.  Imaging: X-rays right knee: These were compared to 2018 x-rays.  Prosthesis is in place with no sign of loosening.  I do not see a definite fracture.   Assessment & Plan: 1.  2 days status post fall with right knee contusion, cannot rule out occult fracture. Gilford Rile for support, weightbearing as tolerated.  Gentle range of motion to prevent stiffness  and straight leg raises for strengthening. -He will contact me next week, if he is still not improving we will bring him in for oblique view x-rays on the day when Dr. Erlinda Hong is also here.     Procedures: No procedures performed  No notes on file     PMFS History: Patient Active Problem List   Diagnosis Date Noted  . Abnormal echocardiogram 02/05/2019  . Left ventricular apical thrombus without myocardial infarction ([place]) 01/26/2019  . 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 Newly recognized heart murmur 01/11/2019  . Dizziness 01/11/2019  . Overweight (BMI 25.0-29.9) 04/20/2018  . Atherosclerosis of aorta ([facility]) 12/18/2015  . Hearing aid worn 08/24/2015  . Essential hypertension, benign 08/24/2015  . Hyperlipidemia 06/29/2007   Past Medical History:  Diagnosis Date  . Adrenal mass ([place]) 2018   Urology fully evaluated; benign  . Allergy    hay fever  . Anxiety   . Cataract   . Cervicalgia   . Complication of anesthesia    hypoglycemia, low blood pressure, pt. reports it dropped to zero- post op MORPHINE , then taken ICU due to either the morphine or hypoglycemia . In addition post op from cataract surgery- 07/2015, experienced an episode of hypoglycemia    .  Depressive disorder, not elsewhere classified   . Diverticulosis   . Elevated prostate specific antigen (PSA) 2008   Dr Jeffie Pollock  . Gallstone   . H/O cardiovascular stress test >10 yrs.   told that its wnl. Told to drink less coffee  . Hematuria 2018   Hematuria with enlarged prostate, kidney cyst, adrenal adenoma--> followed by urology with cystoscopy and repeat CT, benign workup.  [name] Hx of migraines   . Hypertension   . Hypoglycemia   . IBS (irritable bowel syndrome)   . Inguinal hernia 12/2015   Bilateral small indirect hernias on CT, umbilical hernia also present.  . Kidney cyst, acquired 2018   Followed by urology, benign.  . Low back pain   . Macular degeneration (senile) of retina, unspecified    2  different opinions from 2 different Ophth  . Osteoarthrosis, unspecified whether generalized or localized, unspecified site   . Other and unspecified hyperlipidemia   . Prostatitis 2008  . Refusal of blood transfusions as patient is Jehovah's Witness   . Segmental and somatic dysfunction of cervical region   . Segmental and somatic dysfunction of lumbar region   . Segmental and somatic dysfunction of sacral region   . Segmental and somatic dysfunction of thoracic region   . Spondylosis of lumbar spine   . Strain of pelvis   . 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    Family History  Problem Relation Age of Onset  . Lung cancer Father        smoker  . Alcohol abuse Father   . Tuberculosis Father   . Heart disease Father   . Early death Father   . Diabetes Other   . Heart disease Mother   . Heart attack Brother   . Stroke Neg Hx     Past Surgical History:  Procedure Laterality Date  . CATARACT EXTRACTION, BILATERAL    . COLONOSCOPY  2012   Dr Olevia Perches  . CYSTOSCOPY     for hematuria  . hydrocoelectomy  2003   Dr Jeffie Pollock  . KNEE ARTHROSCOPY Left 1994  . prostate nodule resection  2003  . TONSILLECTOMY    . TOTAL KNEE ARTHROPLASTY  2009  . TOTAL KNEE ARTHROPLASTY Right 06/27/2016   Procedure: TOTAL KNEE ARTHROPLASTY;  Surgeon: Leandrew Koyanagi, MD;  Location: [facility];  Service: Orthopedics;  Laterality: Right;   Social History   Occupational History  . Not on file  Tobacco Use  . Smoking status: Former Smoker    Quit date: 07/15/1968    Years since quitting: 50.9  . Smokeless tobacco: Never Used  . Tobacco comment: smoked [place], up to 1ppd  Substance and Sexual Activity  . Alcohol use: Not Currently  . Drug use: No  . Sexual activity: Yes    Partners: Female    Birth control/protection: None

## 2019-06-01 ENCOUNTER — Other Ambulatory Visit: Payer: Self-pay

## 2019-06-01 NOTE — Patient Outreach (Signed)
[facility]) Care Management  06/01/2019  Nathan [name] Jun 02, 1946 [id number]   Medication Adherence call to Nathan [name] message left with a call back number. Nathan [name] is showing past due on Rosuvastatin 20 mg under [place].   [facility] Direct Dial [phone]  Fax [phone] [email or web address]

## 2019-09-07 ENCOUNTER — Telehealth: Payer: Self-pay

## 2019-09-07 MED ORDER — AMLODIPINE BESYLATE 10 MG PO TABS: 10.0000 mg | ORAL_TABLET | Freq: Every day | ORAL | 0 refills | Status: DC

## 2019-09-07 MED ORDER — LISINOPRIL 20 MG PO TABS: 20.0000 mg | ORAL_TABLET | Freq: Every day | ORAL | 0 refills | Status: DC

## 2019-09-07 NOTE — Telephone Encounter (Signed)
Patient refill request  amLODipine (NORVASC) 10 MG tablet [id number]   lisinopril (PRINIVIL,ZESTRIL) 20 MG tablet [id number]   [id number] - [place], [place]

## 2019-09-07 NOTE — Telephone Encounter (Signed)
Pt was called and VM was left to return call and schedule 6 month [place] visit and a short supply would be sent to the pharmacy to last until that appt was scheduled.

## 2019-09-15 ENCOUNTER — Other Ambulatory Visit: Payer: Self-pay

## 2019-09-15 ENCOUNTER — Ambulatory Visit (INDEPENDENT_AMBULATORY_CARE_PROVIDER_SITE_OTHER): Payer: Medicare Other | Admitting: Family Medicine

## 2019-09-15 ENCOUNTER — Encounter: Payer: Self-pay | Admitting: Family Medicine

## 2019-09-15 VITALS — BP 138/82 | HR 64 | Temp 98.3°F | Resp 16 | Ht 71.0 in | Wt 200.4 lb

## 2019-09-15 DIAGNOSIS — Z5320 Procedure and treatment not carried out because of patient's decision for unspecified reasons: Secondary | ICD-10-CM | POA: Diagnosis not present

## 2019-09-15 DIAGNOSIS — I10 Essential (primary) hypertension: Secondary | ICD-10-CM

## 2019-09-15 DIAGNOSIS — E663 Overweight: Secondary | ICD-10-CM

## 2019-09-15 DIAGNOSIS — E7800 Pure hypercholesterolemia, unspecified: Secondary | ICD-10-CM | POA: Diagnosis not present

## 2019-09-15 DIAGNOSIS — I700 Atherosclerosis of aorta: Secondary | ICD-10-CM

## 2019-09-15 MED ORDER — LISINOPRIL 30 MG PO TABS: 30.0000 mg | ORAL_TABLET | Freq: Every day | ORAL | 1 refills | Status: DC

## 2019-09-15 MED ORDER — ZOSTER VAC RECOMB ADJUVANTED 50 MCG/0.5ML IM SUSR: 0.5000 mL | Freq: Once | INTRAMUSCULAR | 1 refills | Status: AC

## 2019-09-15 MED ORDER — AMLODIPINE BESYLATE 10 MG PO TABS: 10.0000 mg | ORAL_TABLET | Freq: Every day | ORAL | 1 refills | Status: DC

## 2019-09-15 NOTE — Patient Instructions (Addendum)
Refilled amlodipine.  Increased lisinopril to 30 mg a day. New pill will be 30 per pill- take one a day when you pick this prescription up.  You can finish your current script of lisinopril 20 mg tab- just take 1.5 tabs to equal 30 mg.  shingrix script printed.    Follow 5.5 mos.    Preventing High Cholesterol Cholesterol is a white, waxy substance similar to fat that the human body needs to help build cells. The liver makes all the cholesterol that a person's body needs. Having high cholesterol (hypercholesterolemia) increases a person's risk for heart disease and stroke. Extra (excess) cholesterol comes from the food the person eats. High cholesterol can often be prevented with diet and lifestyle changes. If you already have high cholesterol, you can control it with diet and lifestyle changes and with medicine. How can high cholesterol affect me? If you have high cholesterol, deposits (plaques) may build up on the walls of your arteries. The arteries are the blood vessels that carry blood away from your heart. Plaques make the arteries narrower and stiffer. This can limit or block blood flow and cause blood clots to form. Blood clots:  Are tiny balls of cells that form in your blood.  Can move to the heart or brain, causing a heart attack or stroke. Plaques in arteries greatly increase your risk for heart attack and stroke.Making diet and lifestyle changes can reduce your risk for these conditions that may threaten your life. What can increase my risk? This condition is more likely to develop in people who:  Eat foods that are high in saturated fat or cholesterol. Saturated fat is mostly found in: ? Foods that contain animal fat, such as red meat and some dairy products. ? Certain fatty foods made from plants, such as tropical oils.  Are overweight.  Are not getting enough exercise.  Have a family history of high cholesterol. What actions can I take to prevent  this? Nutrition   Eat less saturated fat.  Avoid trans fats (partially hydrogenated oils). These are often found in margarine and in some baked goods, fried foods, and snacks bought in packages.  Avoid precooked or cured meat, such as sausages or meat loaves.  Avoid foods and drinks that have added sugars.  Eat more fruits, vegetables, and whole grains.  Choose healthy sources of protein, such as fish, poultry, lean cuts of red meat, beans, peas, lentils, and nuts.  Choose healthy sources of fat, such as: ? Nuts. ? Vegetable oils, especially olive oil. ? Fish that have healthy fats (omega-3 fatty acids), such as mackerel or salmon. The items listed above may not be a complete list of recommended foods and beverages. Contact a dietitian for more information. Lifestyle  Lose weight if you are overweight. Losing 5-10 lb (2.3-4.5 kg) can help prevent or control high cholesterol. It can also lower your risk for diabetes and high blood pressure. Ask your health care provider to help you with a diet and exercise plan to lose weight safely.  Do not use any products that contain nicotine or tobacco, such as cigarettes, e-cigarettes, and chewing tobacco. If you need help quitting, ask your health care provider.  Limit your alcohol intake. ? Do not drink alcohol if:  Your health care provider tells you not to drink.  You are pregnant, may be pregnant, or are planning to become pregnant. ? If you drink alcohol:  Limit how much you use to:  0-1 drink a day for women.  0-2 drinks a day for men.  Be aware of how much alcohol is in your drink. In the [place] one drink equals one 12 oz bottle of beer (355 mL), one 5 oz glass of wine (148 mL), or one 1 oz glass of hard liquor (44 mL). Activity   Get enough exercise. Each week, do at least 150 minutes of exercise that takes a medium level of effort (moderate-intensity exercise). ? This is exercise that:  Makes your heart beat faster and  makes you breathe harder than usual.  Allows you to still be able to talk. ? You could exercise in short sessions several times a day or longer sessions a few times a week. For example, on 5 days each week, you could walk fast or ride your bike 3 times a day for 10 minutes each time.  Do exercises as told by your health care provider. Medicines  In addition to diet and lifestyle changes, your health care provider may recommend medicines to help lower cholesterol. This may be a medicine to lower the amount of cholesterol your liver makes. You may need medicine if: ? Diet and lifestyle changes do not lower your cholesterol enough. ? You have high cholesterol and other risk factors for heart disease or stroke.  Take over-the-counter and prescription medicines only as told by your health care provider. General information  Manage your risk factors for high cholesterol. Talk with your health care provider about all your risk factors and how to lower your risk.  Manage other conditions that you have, such as diabetes or high blood pressure (hypertension).  Have blood tests to check your cholesterol levels at regular points in time as told by your health care provider.  Keep all follow-up visits as told by your health care provider. This is important. Where to find more information  American Heart Association: www.heart.org  National Heart, Lung, and Blood Institute: https://wilson-eaton.com/ Summary  High cholesterol increases your risk for heart disease and stroke. By keeping your cholesterol level low, you can reduce your risk for these conditions.  High cholesterol can often be prevented with diet and lifestyle changes.  Work with your health care provider to manage your risk factors, and have your blood tested regularly. This information is not intended to replace advice given to you by your health care provider. Make sure you discuss any questions you have with your health care  provider. Document Revised: 10/23/2018 Document Reviewed: 03/09/2016 Elsevier Patient Education  2020 [employer].

## 2019-09-15 NOTE — Progress Notes (Signed)
Patient ID: Nathan [name], male  DOB: [birth year], 74 y.o.   MRN: [id number] Patient Care Team    Relationship Specialty Notifications Start End  Ma Hillock, DO PCP - General Family Medicine  12/07/15   Pa, [facility]    06/02/17   Leandrew Koyanagi, MD Attending Physician Orthopedic Surgery  06/02/17     Chief Complaint  Patient presents with  . Hypertension    Pt takes BP at home every once in a while     Subjective:  Nathan [name] is a 74 y.o. male present for [facility] follow up. Hypertension/atheroscolerosis of aorta/HLD/statin declined: Pt reports compliance with amlodipine 10 mg daily and Lisinopril 20 mg daily.Patient denies chest pain, shortness of breath, dizziness or lower extremity edema.   He has refused statin medication. He does not take a daily baby aspirin. Barriers to controlled BP: He believes that medications for blood pressure will cause kidney damage and doe snot believe in statins.  Labs UTD 01/2019 Lipid 08/2017 significantly elevated> refused statin.  Diet: Avoid adding sodium RF: Noncompliance, hypertension, former smoker, fhx heart disease, BMI>25  Depression screen [facility] 2/9 04/20/2018 06/02/2017 05/12/2017 08/24/2015 08/24/2015  Decreased Interest 0 0 0 2 2  Down, Depressed, Hopeless 0 0 0 2 2  PHQ - 2 Score 0 0 0 4 4  Altered sleeping 0 0 - 0 0  Tired, decreased energy 1 0 - 3 2  Change in appetite 0 0 - 2 1  Feeling bad or failure about yourself  0 0 - 2 2  Trouble concentrating 0 0 - 2 2  Moving slowly or fidgety/restless 0 0 - 0 0  Suicidal thoughts 0 0 - 1 0  PHQ-9 Score 1 0 - 14 11  Difficult doing work/chores Not difficult at all Not difficult at all - Very difficult Somewhat difficult   GAD 7 : Generalized Anxiety Score 08/24/2015  Nervous, Anxious, on Edge 2  Control/stop worrying 3  Worry too much - different things 3  Trouble relaxing 1  Restless 0  Easily annoyed or irritable 2  Afraid - awful might happen 0  Total GAD 7  Score 11  Anxiety Difficulty Somewhat difficult       Fall Risk  04/20/2018 06/02/2017 06/02/2017 08/24/2015 02/22/2013  Falls in the past year? No No No No No   Immunization History  Administered Date(s) Administered  . PPD Test 03/17/2013  . Tdap 07/15/2012  . Zoster 02/06/2015   Past Medical History:  Diagnosis Date  . Adrenal mass ([place]) 2018   Urology fully evaluated; benign  . Allergy    hay fever  . Anxiety   . Cataract   . Cervicalgia   . Complication of anesthesia    hypoglycemia, low blood pressure, pt. reports it dropped to zero- post op MORPHINE , then taken ICU due to either the morphine or hypoglycemia . In addition post op from cataract surgery- 07/2015, experienced an episode of hypoglycemia    . Depressive disorder, not elsewhere classified   . Diverticulosis   . Elevated prostate specific antigen (PSA) 2008   Dr Jeffie Pollock  . Gallstone   . H/O cardiovascular stress test >10 yrs.   told that its wnl. Told to drink less coffee  . Hematuria 2018   Hematuria with enlarged prostate, kidney cyst, adrenal adenoma--> followed by urology with cystoscopy and repeat CT, benign workup.  [name] Hx of migraines   . Hypertension   . Hypoglycemia   .  IBS (irritable bowel syndrome)   . Inguinal hernia 12/2015   Bilateral small indirect hernias on CT, umbilical hernia also present.  . Kidney cyst, acquired 2018   Followed by urology, benign.  . Low back pain   . Macular degeneration (senile) of retina, unspecified    2 different opinions from 2 different Ophth  . Osteoarthrosis, unspecified whether generalized or localized, unspecified site   . Other and unspecified hyperlipidemia   . Prostatitis 2008  . Refusal of blood transfusions as patient is Jehovah's Witness   . Segmental and somatic dysfunction of cervical region   . Segmental and somatic dysfunction of lumbar region   . Segmental and somatic dysfunction of sacral region   . Segmental and somatic dysfunction of thoracic  region   . Spondylosis of lumbar spine   . Strain of pelvis   . 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   Allergies  Allergen Reactions  . Morphine And Related Other (See Comments)    Severe drop in blood pressure  . Citalopram Hydrobromide Other (See Comments)    PATIENT PREFERENCE: "Just ineffective"  . Sertraline Hcl Other (See Comments)    Headache   Past Surgical History:  Procedure Laterality Date  . CATARACT EXTRACTION, BILATERAL    . COLONOSCOPY  2012   Dr Olevia Perches  . CYSTOSCOPY     for hematuria  . hydrocoelectomy  2003   Dr Jeffie Pollock  . KNEE ARTHROSCOPY Left 1994  . prostate nodule resection  2003  . TONSILLECTOMY    . TOTAL KNEE ARTHROPLASTY  2009  . TOTAL KNEE ARTHROPLASTY Right 06/27/2016   Procedure: TOTAL KNEE ARTHROPLASTY;  Surgeon: Leandrew Koyanagi, MD;  Location: [place];  Service: Orthopedics;  Laterality: Right;   Family History  Problem Relation Age of Onset  . Lung cancer Father        smoker  . Alcohol abuse Father   . Tuberculosis Father   . Heart disease Father   . Early death Father   . Diabetes Other   . Heart disease Mother   . Heart attack Brother   . Stroke Neg Hx    Social History   Socioeconomic History  . Marital status: Married    Spouse name: Not on file  . Number of children: Not on file  . Years of education: Not on file  . Highest education level: Not on file  Occupational History  . Not on file  Tobacco Use  . Smoking status: Former Smoker    Quit date: 07/15/1968    Years since quitting: 51.2  . Smokeless tobacco: Never Used  . Tobacco comment: smoked [place], up to 1ppd  Substance and Sexual Activity  . Alcohol use: Not Currently  . Drug use: No  . Sexual activity: Yes    Partners: Female    Birth control/protection: None  Other Topics Concern  . Not on file  Social History Narrative   Married. Wife's name is [name]. Full time employed, Pensions consultant.   Drinks caffeinated  beverages. Take a multivitamin. Wears his seatbelt.   Exercises at least 3 times a week. Is not a strict vegetarian, but only eats meat 1-2 times a week   Wears a hearing aid. Does not wear dentures or use an assistive walking device.   There is a smoke detector in his home.   He feels safe in his relationships.   Social Determinants of Health  Financial Resource Strain:   . Difficulty of Paying Living Expenses: Not on file  Food Insecurity:   . Worried About Charity fundraiser in the Last Year: Not on file  . Ran Out of Food in the Last Year: Not on file  Transportation Needs:   . Lack of Transportation (Medical): Not on file  . Lack of Transportation (Non-Medical): Not on file  Physical Activity:   . Days of Exercise per Week: Not on file  . Minutes of Exercise per Session: Not on file  Stress:   . Feeling of Stress : Not on file  Social Connections:   . Frequency of Communication with Friends and Family: Not on file  . Frequency of Social Gatherings with Friends and Family: Not on file  . Attends Religious Services: Not on file  . Active Member of Clubs or Organizations: Not on file  . Attends Archivist Meetings: Not on file  . Marital Status: Not on file  Intimate Partner Violence:   . Fear of Current or Ex-Partner: Not on file  . Emotionally Abused: Not on file  . Physically Abused: Not on file  . Sexually Abused: Not on file   Allergies as of 09/15/2019      Reactions   Morphine And Related Other (See Comments)   Severe drop in blood pressure   Citalopram Hydrobromide Other (See Comments)   PATIENT PREFERENCE: "Just ineffective"   Sertraline Hcl Other (See Comments)   Headache      Medication List       Accurate as of September 15, 2019  9:02 AM. If you have any questions, ask your nurse or doctor.        amLODipine 10 MG tablet Commonly known as: NORVASC Take 1 tablet (10 mg total) by mouth daily.   aspirin EC 81 MG tablet Take 81 mg by mouth  daily.   b complex vitamins tablet Take 1 tablet by mouth every other day.   lisinopril 30 MG tablet Commonly known as: ZESTRIL Take 1 tablet (30 mg total) by mouth daily. Needs office visit prior to any additional refills What changed:   medication strength  how much to take Changed by: Howard Pouch, DO   MULTIPLE VITAMIN PO Take 1 tablet by mouth daily.   Vitamin A 2250 MCG (7500 UT) Caps Take 7,500 Units by mouth daily.   vitamin C 500 MG tablet Commonly known as: ASCORBIC ACID Take 1,000 mg by mouth daily.   Vitamin D-3 25 MCG (1000 UT) Caps Take 1,000 Units by mouth daily with breakfast.   Zoster Vaccine Adjuvanted injection Commonly known as: SHINGRIX Inject 0.5 mLs into the muscle once for 1 dose. Repeat dose once in 2-6 months. Started by: Howard Pouch, DO      All past medical history, surgical history, allergies, family history, immunizations andmedications were updated in the EMR today and reviewed under the history and medication portions of their EMR.     No results found for this or any previous visit (from the past 2160 hour(s)).  No results found.   ROS: 14 pt review of systems performed and negative (unless mentioned in an HPI)  Objective: BP 138/82 (BP Location: Right Arm, Patient Position: Sitting, Cuff Size: Normal)   Pulse 64   Temp 98.3 F (36.8 C) (Temporal)   Resp 16   Ht 5\' 11"  (1.803 m)   Wt 200 lb 6 oz (90.9 kg)   SpO2 99%   BMI 27.95 kg/m  Gen: Afebrile. No acute distress. Nontoxic in appearance, mildlyoverweight.  HENT: AT. [place].  Eyes:Pupils Equal Round Reactive to light, Extraocular movements intact,  Conjunctiva without redness, discharge or icterus. Neck/lymp/endocrine: Supple,no lymphadenopathy, no thyromegaly CV: RRR no murmur, no edema, +2/4 P posterior tibialis pulses Chest: CTAB, no wheeze or crackles Abd: Soft. NTND. BS present. no Masses palpated.  Skin: no rashes, purpura or petechiae.  Neuro:  Normal gait. PERLA.  EOMi. Alert. Oriented x3  Psych: Normal affect, dress and demeanor. Normal speech. Normal thought content and judgment.    No exam data present  Assessment/plan: DRYSTAN BOUSE is a 74 y.o. male present for [facility] hyperlipidemia/Atherosclerosis of aorta (HCC)/hypertension/Overweight (BMI 25.0-29.9) - stable. Borderline.  - continue amlodipine 10 - increased lisinopril to 30 mg QD  - Encourage fish oil supplementation.  - He does not take baby aspirin daily, secondary to easy bruising.   Return in about 24 weeks (around 03/01/2020) for [facility] (30 min). No orders of the defined types were placed in this encounter.  Meds ordered this encounter  Medications  . amLODipine (NORVASC) 10 MG tablet    Sig: Take 1 tablet (10 mg total) by mouth daily.    Dispense:  90 tablet    Refill:  1  . lisinopril (ZESTRIL) 30 MG tablet    Sig: Take 1 tablet (30 mg total) by mouth daily. Needs office visit prior to any additional refills    Dispense:  90 tablet    Refill:  1  . Zoster Vaccine Adjuvanted [facility]) injection    Sig: Inject 0.5 mLs into the muscle once for 1 dose. Repeat dose once in 2-6 months.    Dispense:  0.5 mL    Refill:  1   Referral Orders  No referral(s) requested today     Note is dictated utilizing voice recognition software. Although note has been proof read prior to signing, occasional typographical errors still can be missed. If any questions arise, please do not hesitate to call for verification.  Electronically signed by: Howard Pouch, DO [place]

## 2019-09-20 ENCOUNTER — Ambulatory Visit: Payer: Medicare Other | Attending: Internal Medicine

## 2019-09-20 DIAGNOSIS — Z23 Encounter for immunization: Secondary | ICD-10-CM | POA: Insufficient documentation

## 2019-09-20 NOTE — Progress Notes (Signed)
   Covid-19 Vaccination Clinic  Name:  Nathan [name]    MRN: [id number] DOB: 07/23/1945  09/20/2019  Mr. [name] was observed post Covid-19 immunization for 15 minutes without incident. He was provided with Vaccine Information Sheet and instruction to access the V-Safe system.   Mr. [name] was instructed to call 911 with any severe reactions post vaccine: [name] Difficulty breathing  . Swelling of face and throat  . A fast heartbeat  . A bad rash all over body  . Dizziness and weakness   Immunizations Administered    Name Date Dose VIS Date Route   Pfizer COVID-19 Vaccine 09/20/2019  1:23 PM 0.3 mL 06/25/2019 Intramuscular   Manufacturer: [place]   Lot: [id number]   [place]: [id number]

## 2019-09-29 ENCOUNTER — Other Ambulatory Visit: Payer: Self-pay | Admitting: Family Medicine

## 2019-10-20 ENCOUNTER — Ambulatory Visit: Payer: Medicare Other | Attending: Internal Medicine

## 2019-10-20 DIAGNOSIS — Z23 Encounter for immunization: Secondary | ICD-10-CM

## 2019-10-20 NOTE — Progress Notes (Signed)
   Covid-19 Vaccination Clinic  Name:  Nathan [name]    MRN: [id number] DOB: 08-24-1945  10/20/2019  [name] was observed post Covid-19 immunization for 15 minutes without incident. He was provided with Vaccine Information Sheet and instruction to access the V-Safe system.   [name] was instructed to call 911 with any severe reactions post vaccine: [name] Difficulty breathing  . Swelling of face and throat  . A fast heartbeat  . A bad rash all over body  . Dizziness and weakness   Immunizations Administered    Name Date Dose VIS Date Route   Pfizer COVID-19 Vaccine 10/20/2019 11:45 AM 0.3 mL 06/25/2019 Intramuscular   Manufacturer: [employer]   Lot: [id number]   [place]: [id number]

## 2019-11-30 ENCOUNTER — Encounter (HOSPITAL_COMMUNITY): Payer: Self-pay | Admitting: Emergency Medicine

## 2019-11-30 ENCOUNTER — Other Ambulatory Visit: Payer: Self-pay

## 2019-11-30 ENCOUNTER — Emergency Department (HOSPITAL_COMMUNITY): Payer: Medicare Other

## 2019-11-30 ENCOUNTER — Emergency Department (HOSPITAL_COMMUNITY)
Admission: EM | Admit: 2019-11-30 | Discharge: 2019-11-30 | Disposition: A | Discharge status: Home / Self Care | Payer: Medicare Other | Attending: Emergency Medicine | Admitting: Emergency Medicine

## 2019-11-30 ENCOUNTER — Telehealth: Payer: Self-pay

## 2019-11-30 DIAGNOSIS — R079 Chest pain, unspecified: Secondary | ICD-10-CM | POA: Insufficient documentation

## 2019-11-30 DIAGNOSIS — I10 Essential (primary) hypertension: Secondary | ICD-10-CM | POA: Diagnosis not present

## 2019-11-30 DIAGNOSIS — Z79899 Other long term (current) drug therapy: Secondary | ICD-10-CM | POA: Insufficient documentation

## 2019-11-30 DIAGNOSIS — Z7982 Long term (current) use of aspirin: Secondary | ICD-10-CM | POA: Insufficient documentation

## 2019-11-30 DIAGNOSIS — Z87891 Personal history of nicotine dependence: Secondary | ICD-10-CM | POA: Insufficient documentation

## 2019-11-30 DIAGNOSIS — Z96651 Presence of right artificial knee joint: Secondary | ICD-10-CM | POA: Insufficient documentation

## 2019-11-30 DIAGNOSIS — R0602 Shortness of breath: Secondary | ICD-10-CM | POA: Diagnosis not present

## 2019-11-30 DIAGNOSIS — R0789 Other chest pain: Secondary | ICD-10-CM | POA: Diagnosis not present

## 2019-11-30 LAB — BASIC METABOLIC PANEL
Anion gap: 12 (ref 5–15)
BUN: 12 mg/dL (ref 8–23)
CO2: 23 mmol/L (ref 22–32)
Calcium: 9 mg/dL (ref 8.9–10.3)
Chloride: 104 mmol/L (ref 98–111)
Creatinine, Ser: 1.14 mg/dL (ref 0.61–1.24)
GFR calc Af Amer: >60 mL/min (ref >60)
GFR calc non Af Amer: >60 mL/min (ref >60)
Glucose, Bld: 128 mg/dL — ABNORMAL HIGH (ref 70–99)
Potassium: 4 mmol/L (ref 3.5–5.1)
Sodium: 139 mmol/L (ref 135–145)

## 2019-11-30 LAB — CBC
HCT: 41.8 % (ref 39.0–52.0)
Hemoglobin: 13.9 g/dL (ref 13.0–17.0)
MCH: 30.3 pg (ref 26.0–34.0)
MCHC: 33.3 g/dL (ref 30.0–36.0)
MCV: 91.3 fL (ref 80.0–100.0)
Platelets: 286 10*3/uL (ref 150–400)
RBC: 4.58 MIL/uL (ref 4.22–5.81)
RDW: 14.7 % (ref 11.5–15.5)
WBC: 8.1 10*3/uL (ref 4.0–10.5)
nRBC: 0 % (ref 0.0–0.2)

## 2019-11-30 LAB — TROPONIN I (HIGH SENSITIVITY)
Troponin I (High Sensitivity): 5 ng/L (ref <18)
Troponin I (High Sensitivity): 6 ng/L (ref <18)

## 2019-11-30 NOTE — ED Triage Notes (Signed)
Pt endorses intermittent left sided sharp chest pain for a few days. Tingling in right arm for 3-4 days.

## 2019-11-30 NOTE — Telephone Encounter (Signed)
Patient called to schedule appt with Dr. Raoul Pitch stated that he over the last the week he had some chest tightness about 4 or 5 times which has concerned him, he does not have any symptoms right now at the present. He is wanting some guidance from Dr. Raoul Pitch and test ran. I told him from the symptoms he is experiencing  I need to send him to a triage nurse so she could ask him more detailed question, etc. He asked " will she suggest that I go to the ER". I said. "I am not sure, more than likely, based on your symptoms, probably so.  He said 'thank you for your guidance, no need to speak to triage nurse I am going to the ER right now, will you please let Dr. Raoul Pitch know, then once I have arrived.  I told him yes sir.  Please let Dr. Raoul Pitch know. Thank you .

## 2019-11-30 NOTE — ED Provider Notes (Signed)
[facility] EMERGENCY DEPARTMENT Provider Note   CSN: [id number] Arrival date & time: 11/30/19  [id number]     History Chief Complaint  Patient presents with  . Chest Pain    Nathan [name] is a 74 y.o. male.  HPI  HPI: A 74 year old patient with a history of hypertension and hypercholesterolemia presents for evaluation of chest pain. Initial onset of pain was more than 6 hours ago. The patient's chest pain is well-localized, is sharp and is not worse with exertion. The patient's chest pain is middle- or left-sided, is not described as heaviness/pressure/tightness and does not radiate to the arms/jaw/neck. The patient does not complain of nausea and denies diaphoresis. The patient has no history of stroke, has no history of peripheral artery disease, has not smoked in the past 90 days, denies any history of treated diabetes, has no relevant family history of coronary artery disease (first degree relative at less than age 6) and does not have an elevated BMI (>=30). FIrst started noticing it about 4 days ago.  Lasts just a few seconds at a time.  The last episode he had was last evening.  Past Medical History:  Diagnosis Date  . Adrenal mass ([place]) 2018   Urology fully evaluated; benign  . Allergy    hay fever  . Anxiety   . Cataract   . Cervicalgia   . Complication of anesthesia    hypoglycemia, low blood pressure, pt. reports it dropped to zero- post op MORPHINE , then taken ICU due to either the morphine or hypoglycemia . In addition post op from cataract surgery- 07/2015, experienced an episode of hypoglycemia    . Depressive disorder, not elsewhere classified   . Diverticulosis   . Elevated prostate specific antigen (PSA) 2008   Dr Jeffie Pollock  . Gallstone   . H/O cardiovascular stress test >10 yrs.   told that its wnl. Told to drink less coffee  . Hematuria 2018   Hematuria with enlarged prostate, kidney cyst, adrenal adenoma--> followed by urology with cystoscopy and  repeat CT, benign workup.  [name] Hx of migraines   . Hypertension   . Hypoglycemia   . IBS (irritable bowel syndrome)   . Inguinal hernia 12/2015   Bilateral small indirect hernias on CT, umbilical hernia also present.  . Kidney cyst, acquired 2018   Followed by urology, benign.  . Low back pain   . Macular degeneration (senile) of retina, unspecified    2 different opinions from 2 different Ophth  . Osteoarthrosis, unspecified whether generalized or localized, unspecified site   . Other and unspecified hyperlipidemia   . Prostatitis 2008  . Refusal of blood transfusions as patient is Jehovah's Witness   . Segmental and somatic dysfunction of cervical region   . Segmental and somatic dysfunction of lumbar region   . Segmental and somatic dysfunction of sacral region   . Segmental and somatic dysfunction of thoracic region   . Spondylosis of lumbar spine   . Strain of pelvis   . 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    Patient Active Problem List   Diagnosis Date Noted  . Statin declined 09/15/2019  . Abnormal echocardiogram 02/05/2019  . 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 Dizziness 01/11/2019  . Overweight (BMI 25.0-29.9) 04/20/2018  . Atherosclerosis of aorta ([place]) 12/18/2015  . Hearing aid worn 08/24/2015  . Essential  hypertension, benign 08/24/2015  . Hyperlipidemia 06/29/2007    Past Surgical History:  Procedure Laterality Date  . CATARACT EXTRACTION, BILATERAL    . COLONOSCOPY  2012   Dr Olevia Perches  . CYSTOSCOPY     for hematuria  . hydrocoelectomy  2003   Dr Jeffie Pollock  . KNEE ARTHROSCOPY Left 1994  . prostate nodule resection  2003  . TONSILLECTOMY    . TOTAL KNEE ARTHROPLASTY  2009  . TOTAL KNEE ARTHROPLASTY Right 06/27/2016   Procedure: TOTAL KNEE ARTHROPLASTY;  Surgeon: Leandrew Koyanagi, MD;  Location: [place];  Service: Orthopedics;  Laterality:  Right;       Family History  Problem Relation Age of Onset  . Lung cancer Father        smoker  . Alcohol abuse Father   . Tuberculosis Father   . Heart disease Father   . Early death Father   . Diabetes Other   . Heart disease Mother   . Heart attack Brother   . Stroke Neg Hx     Social History   Tobacco Use  . Smoking status: Former Smoker    Quit date: 07/15/1968    Years since quitting: 51.4  . Smokeless tobacco: Never Used  . Tobacco comment: smoked [place], up to 1ppd  Substance Use Topics  . Alcohol use: Not Currently  . Drug use: No    Home Medications Prior to Admission medications   Medication Sig Start Date End Date Taking? Authorizing Provider  amLODipine (NORVASC) 10 MG tablet Take 1 tablet (10 mg total) by mouth daily. 09/15/19   Kuneff, Renee A, DO  aspirin EC 81 MG tablet Take 81 mg by mouth daily.    [provider]  b complex vitamins tablet Take 1 tablet by mouth every other day.    [provider]  Cholecalciferol (VITAMIN D-3) 25 MCG (1000 UT) CAPS Take 1,000 Units by mouth daily with breakfast.    [provider]  lisinopril (ZESTRIL) 30 MG tablet Take 1 tablet (30 mg total) by mouth daily. Needs office visit prior to any additional refills 09/15/19   Kuneff, Renee A, DO  MULTIPLE VITAMIN PO Take 1 tablet by mouth daily.    [provider]  Vitamin A 2250 MCG (7500 UT) CAPS Take 7,500 Units by mouth daily.     [provider]  vitamin C (ASCORBIC ACID) 500 MG tablet Take 1,000 mg by mouth daily.     [provider]    Allergies    Morphine and related, Citalopram hydrobromide, and Sertraline hcl  Review of Systems   Review of Systems  Constitutional: Negative for fever.  Respiratory: Negative for cough and shortness of breath.   Neurological: Negative for weakness and numbness.  All other systems reviewed and are negative.   Physical Exam Updated Vital Signs BP (!) 160/74 (BP Location:  Left Arm)   Pulse 71   Temp 98 F (36.7 C) (Oral)   Resp 15   Ht 1.791 m (5' 10.5")   Wt 88.5 kg   SpO2 100%   BMI 27.58 kg/m   Physical Exam Vitals and nursing note reviewed.  Constitutional:      General: He is not in acute distress.    Appearance: He is well-developed.  HENT:     Head: Normocephalic and atraumatic.     Right Ear: External ear normal.     Left Ear: External ear normal.  Eyes:     General:  No scleral icterus.       Right eye: No discharge.        Left eye: No discharge.     Conjunctiva/sclera: Conjunctivae normal.  Neck:     Trachea: No tracheal deviation.  Cardiovascular:     Rate and Rhythm: Normal rate and regular rhythm.  Pulmonary:     Effort: Pulmonary effort is normal. No respiratory distress.     Breath sounds: Normal breath sounds. No stridor. No wheezing or rales.  Abdominal:     General: Bowel sounds are normal. There is no distension.     Palpations: Abdomen is soft.     Tenderness: There is no abdominal tenderness. There is no guarding or rebound.  Musculoskeletal:        General: No tenderness.     Cervical back: Neck supple.  Skin:    General: Skin is warm and dry.     Findings: No rash.  Neurological:     Mental Status: He is alert.     Cranial Nerves: No cranial nerve deficit (no facial droop, extraocular movements intact, no slurred speech).     Sensory: No sensory deficit.     Motor: No abnormal muscle tone or seizure activity.     Coordination: Coordination normal.     ED Results / Procedures / Treatments   Labs (all labs ordered are listed, but only abnormal results are displayed) Labs Reviewed  BASIC METABOLIC PANEL - Abnormal; Notable for the following components:      Result Value   Glucose, Bld 128 (*)    All other components within normal limits  CBC  TROPONIN I (HIGH SENSITIVITY)  TROPONIN I (HIGH SENSITIVITY)    EKG EKG Interpretation  Date/Time:  Tuesday Nov 30 2019 09:29:32 EDT Ventricular Rate:   66 PR Interval:  160 QRS Duration: 98 QT Interval:  408 QTC Calculation: 427 R Axis:   9 Text Interpretation: Normal sinus rhythm Possible Anterior infarct , age undetermined Abnormal ECG No significant change since last tracing Confirmed by Dorie Rank [phone]) on 11/30/2019 3:33:00 PM   Radiology DG Chest 2 View  Result Date: 11/30/2019 CLINICAL DATA:  Intermittent left-sided chest pain and right arm tingling. Shortness of breath. EXAM: CHEST - 2 VIEW COMPARISON:  09/30/2018 FINDINGS: The cardiomediastinal silhouette is unchanged with normal heart size. Aortic atherosclerosis is noted. No airspace consolidation, edema, pleural effusion, or pneumothorax is identified. No acute osseous abnormality is seen. IMPRESSION: No active cardiopulmonary disease. Electronically Signed   By: Logan Bores M.D.   On: 11/30/2019 10:13    Procedures Procedures (including critical care time)  Medications Ordered in ED Medications - No data to display  ED Course  I have reviewed the triage vital signs and the nursing notes.  Pertinent labs & imaging results that were available during my care of the patient were reviewed by me and considered in my medical decision making (see chart for details).    MDM Rules/Calculators/A&P HEAR Score: 4                    Patient presented for evaluation of chest pain.  The episodes were very brief, seconds in nature.  Patient is not having any fevers or chills.  No shortness of breath.  Patient's laboratory tests are reassuring.  Normal CBC and metabolic panel.  Serial troponins were negative with no significant delta.  Patient's chest x-ray does not show pneumonia or any pneumothorax.  At this time I doubt acute coronary  syndrome, pulmonary embolism, aortic dissection or other emergent etiology.  Heart pathway algorithm followed.  Patient appears stable for close outpatient follow-up.  Warning signs precautions discussed. Final Clinical Impression(s) / ED  Diagnoses Final diagnoses:  Chest pain, unspecified type    Rx / DC Orders ED Discharge Orders    None       Dorie Rank, MD 11/30/19 [phone]

## 2019-11-30 NOTE — ED Notes (Signed)
Patient verbalizes understanding of discharge instructions. Opportunity for questioning and answers were provided. Armband removed by staff, pt discharged from ED.  

## 2019-11-30 NOTE — Telephone Encounter (Signed)
Per Epic pt is at ED. Sent to Dr Raoul Pitch.

## 2019-11-30 NOTE — Discharge Instructions (Addendum)
Follow-up with your primary care doctor to discuss further evaluation.  Return as needed for worsening symptoms

## 2019-12-06 ENCOUNTER — Other Ambulatory Visit: Payer: Self-pay

## 2019-12-06 ENCOUNTER — Ambulatory Visit (INDEPENDENT_AMBULATORY_CARE_PROVIDER_SITE_OTHER): Payer: Medicare Other | Admitting: Family Medicine

## 2019-12-06 ENCOUNTER — Encounter: Payer: Self-pay | Admitting: Family Medicine

## 2019-12-06 VITALS — BP 144/83 | HR 62 | Temp 98.4°F | Resp 18 | Ht 71.0 in | Wt 199.4 lb

## 2019-12-06 DIAGNOSIS — I10 Essential (primary) hypertension: Secondary | ICD-10-CM

## 2019-12-06 DIAGNOSIS — I700 Atherosclerosis of aorta: Secondary | ICD-10-CM

## 2019-12-06 DIAGNOSIS — R200 Anesthesia of skin: Secondary | ICD-10-CM | POA: Diagnosis not present

## 2019-12-06 DIAGNOSIS — R079 Chest pain, unspecified: Secondary | ICD-10-CM | POA: Diagnosis not present

## 2019-12-06 DIAGNOSIS — I6521 Occlusion and stenosis of right carotid artery: Secondary | ICD-10-CM

## 2019-12-06 DIAGNOSIS — Z79890 Hormone replacement therapy (postmenopausal): Secondary | ICD-10-CM | POA: Diagnosis not present

## 2019-12-06 DIAGNOSIS — E7800 Pure hypercholesterolemia, unspecified: Secondary | ICD-10-CM

## 2019-12-06 LAB — PSA: PSA: 5.99 ng/mL — ABNORMAL HIGH (ref 0.10–4.00)

## 2019-12-06 LAB — TESTOSTERONE: Testosterone: 444.34 ng/dL (ref 300.00–890.00)

## 2019-12-06 MED ORDER — ATORVASTATIN CALCIUM 40 MG PO TABS: 40.0000 mg | ORAL_TABLET | Freq: Every day | ORAL | 3 refills | Status: DC

## 2019-12-06 NOTE — Patient Instructions (Signed)

## 2019-12-06 NOTE — Progress Notes (Signed)
This visit occurred during the SARS-CoV-2 public health emergency.  Safety protocols were in place, including screening questions prior to the visit, additional usage of staff PPE, and extensive cleaning of exam room while observing appropriate contact time as indicated for disinfecting solutions.    Nathan [name] , [birth year], 74 y.o., male MRN: [id number] Patient Care Team    Relationship Specialty Notifications Start End  Ma Hillock, DO PCP - General Family Medicine  12/07/15   Pa, [facility]    06/02/17   Leandrew Koyanagi, MD Attending Physician Orthopedic Surgery  06/02/17     Chief Complaint  Patient presents with  . Follow-up    Pt was having chest pain and went to ED. Pt has not had BP meds this morning      Subjective: Pt presents for an OV to follow up on ED visit for 4 day h/o of chest pain 11/30/2019.Pain was described as sharp and last a few seconds at a time.  He reports since his ED visit he has not experienced any more chest discomfort.  At that time it would last 6 to 8 seconds.  Occurred when sitting or laying flat.  He reports he has been compliant with his blood pressure medication.  Although he does admit he forgot it this morning because he was in a rush.  He also endorses having an odd sensation in his left upper and lower extremity where he felt like it went numb for about 5 seconds yesterday.  It has not reoccurred since.  He is taking baby aspirin every other day, secondary to easy bruising.  He also states he has been using a testosterone product provided to him by his chiropractor.  He started this medication in March.  He reports his chiropractor is a Scientific laboratory technician for this particular supplement and it was given to him for decreased libido, fatigue and some depression signs.  Patient has a history of elevated PSAs that used to be followed by urology, but he has not followed in some time secondary to mildly elevated but stable PSA around 4.7.    Patient also has decided he will start a statin medication.  He has declined this in the past, but states after the last few weeks he has decided that we will be a good decision to start. Reviewed ED record negative troponins.  Blood pressure was mildly elevated.  He had a unremarkable CBC and BMP.  Chest x-ray showed no active cardiopulmonary disease.  Aortic atherosclerosis was noted.  EKG normal sinus rhythm.  Possible anterior infarct age undetermined.  No significant change since last tracing reported.  Depression screen [facility] 2/9 04/20/2018 06/02/2017 05/12/2017 08/24/2015 08/24/2015  Decreased Interest 0 0 0 2 2  Down, Depressed, Hopeless 0 0 0 2 2  PHQ - 2 Score 0 0 0 4 4  Altered sleeping 0 0 - 0 0  Tired, decreased energy 1 0 - 3 2  Change in appetite 0 0 - 2 1  Feeling bad or failure about yourself  0 0 - 2 2  Trouble concentrating 0 0 - 2 2  Moving slowly or fidgety/restless 0 0 - 0 0  Suicidal thoughts 0 0 - 1 0  PHQ-9 Score 1 0 - 14 11  Difficult doing work/chores Not difficult at all Not difficult at all - Very difficult Somewhat difficult    Allergies  Allergen Reactions  . Morphine And Related Other (See Comments)    Severe  drop in blood pressure  . Citalopram Hydrobromide Other (See Comments)    PATIENT PREFERENCE: "Just ineffective"  . Sertraline Hcl Other (See Comments)    Headache   Social History   Social History Narrative   Married. Wife's name is [name]. Full time employed, Pensions consultant.   Drinks caffeinated beverages. Take a multivitamin. Wears his seatbelt.   Exercises at least 3 times a week. Is not a strict vegetarian, but only eats meat 1-2 times a week   Wears a hearing aid. Does not wear dentures or use an assistive walking device.   There is a smoke detector in his home.   He feels safe in his relationships.   Past Medical History:  Diagnosis Date  . Adrenal mass ([place]) 2018   Urology fully evaluated; benign  . Allergy    hay fever  .  Anxiety   . Cataract   . Cervicalgia   . Complication of anesthesia    hypoglycemia, low blood pressure, pt. reports it dropped to zero- post op MORPHINE , then taken ICU due to either the morphine or hypoglycemia . In addition post op from cataract surgery- 07/2015, experienced an episode of hypoglycemia    . Depressive disorder, not elsewhere classified   . Diverticulosis   . Elevated prostate specific antigen (PSA) 2008   Dr Jeffie Pollock  . Gallstone   . H/O cardiovascular stress test >10 yrs.   told that its wnl. Told to drink less coffee  . Hematuria 2018   Hematuria with enlarged prostate, kidney cyst, adrenal adenoma--> followed by urology with cystoscopy and repeat CT, benign workup.  [name] Hx of migraines   . Hypertension   . Hypoglycemia   . IBS (irritable bowel syndrome)   . Inguinal hernia 12/2015   Bilateral small indirect hernias on CT, umbilical hernia also present.  . Kidney cyst, acquired 2018   Followed by urology, benign.  . Low back pain   . Macular degeneration (senile) of retina, unspecified    2 different opinions from 2 different Ophth  . Osteoarthrosis, unspecified whether generalized or localized, unspecified site   . Other and unspecified hyperlipidemia   . Prostatitis 2008  . Refusal of blood transfusions as patient is Jehovah's Witness   . Segmental and somatic dysfunction of cervical region   . Segmental and somatic dysfunction of lumbar region   . Segmental and somatic dysfunction of sacral region   . Segmental and somatic dysfunction of thoracic region   . Spondylosis of lumbar spine   . Strain of pelvis   . 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   Past Surgical History:  Procedure Laterality Date  . CATARACT EXTRACTION, BILATERAL    . COLONOSCOPY  2012   Dr Olevia Perches  . CYSTOSCOPY     for hematuria  . hydrocoelectomy  2003   Dr Jeffie Pollock  . KNEE ARTHROSCOPY Left 1994  . prostate nodule resection  2003  . TONSILLECTOMY    .  TOTAL KNEE ARTHROPLASTY  2009  . TOTAL KNEE ARTHROPLASTY Right 06/27/2016   Procedure: TOTAL KNEE ARTHROPLASTY;  Surgeon: Leandrew Koyanagi, MD;  Location: [place];  Service: Orthopedics;  Laterality: Right;   Family History  Problem Relation Age of Onset  . Lung cancer Father        smoker  . Alcohol abuse Father   . Tuberculosis Father   . Heart disease Father   . Early death Father   .  Diabetes Other   . Heart disease Mother   . Heart attack Brother   . Stroke Neg Hx    Allergies as of 12/06/2019      Reactions   Morphine And Related Other (See Comments)   Severe drop in blood pressure   Citalopram Hydrobromide Other (See Comments)   PATIENT PREFERENCE: "Just ineffective"   Sertraline Hcl Other (See Comments)   Headache      Medication List       Accurate as of Dec 06, 2019 11:59 PM. If you have any questions, ask your nurse or doctor.        STOP taking these medications   testosterone 50 MG/5GM (1%) Gel Commonly known as: ANDROGEL Stopped by: Howard Pouch, DO     TAKE these medications   amLODipine 10 MG tablet Commonly known as: NORVASC Take 1 tablet (10 mg total) by mouth daily.   aspirin EC 81 MG tablet Take 81 mg by mouth daily.   atorvastatin 40 MG tablet Commonly known as: LIPITOR Take 1 tablet (40 mg total) by mouth daily. Started by: Howard Pouch, DO   b complex vitamins tablet Take 1 tablet by mouth every other day.   lisinopril 30 MG tablet Commonly known as: ZESTRIL Take 1 tablet (30 mg total) by mouth daily. Needs office visit prior to any additional refills   MULTIPLE VITAMIN PO Take 1 tablet by mouth daily.   Vitamin A 2250 MCG (7500 UT) Caps Take 7,500 Units by mouth daily.   vitamin C 500 MG tablet Commonly known as: ASCORBIC ACID Take 1,000 mg by mouth daily.   Vitamin D-3 25 MCG (1000 UT) Caps Take 1,000 Units by mouth daily with breakfast.       All past medical history, surgical history, allergies, family history,  immunizations andmedications were updated in the EMR today and reviewed under the history and medication portions of their EMR.     ROS: Negative, with the exception of above mentioned in HPI   Objective:  BP (!) 144/83 (BP Location: Left Arm, Patient Position: Sitting, Cuff Size: Normal)   Pulse 62   Temp 98.4 F (36.9 C) (Temporal)   Resp 18   Ht 5\' 11"  (1.803 m)   Wt 199 lb 6 oz (90.4 kg)   SpO2 99%   BMI 27.81 kg/m  Body mass index is 27.81 kg/m. Gen: Afebrile. No acute distress. Nontoxic in appearance, well developed, well nourished.  HENT: AT. [place]. No cough or shortness of breath.  Eyes:Pupils Equal Round Reactive to light, Extraocular movements intact,  Conjunctiva without redness, discharge or icterus. CV: RRR no murmur notice today, no edema Chest: CTAB, no wheeze or crackles. Good air movement, normal resp effort.  Neuro:  Normal gait. PERLA. EOMi. Alert. Oriented x3 Cranial nerves II through XII intact. Muscle strength 5/5 Bilateral U/L extremity. DTRs equal bilaterally. Psych: Normal affect, dress and demeanor. Normal speech. Normal thought content and judgment.  No exam data present No results found. No results found for this or any previous visit (from the past 24 hour(s)).  Assessment/Plan: HASKER DUNSTON is a 74 y.o. male present for OV for  Essential hypertension, benign/atherosclerosis of aorta/stenosis right carotid artery/hyperlipidemia Patient will continue to monitor at home.  He reports it has been normal but he did forget to take his blood pressure medication this morning. He is agreeable to start Lipitor.  Lipitor 40 mg daily prescribed for him today.  We will recheck his cholesterol and LFTs at  his next follow-up appointment. Restart ASA 81 mg daily, at least for now.  Chest pain, unspecified type/numbness leg/arm left side/stenosis right carotid artery Chest pain unlikely cardiac in nature or PE.  However with his reports of starting a testosterone  supplement, difficult to rule out possible side effect of supplement versus atypical chest pain.  His chest pain has resolved fortunately.  He does have a event in which he felt a very brief less than 5 seconds numbness feeling in his arm/leg on the left side. - Testosterone - PSA - D-Dimer, Quantitative  Hormone replacement therapy Patient reports he is not certain exactly what type of hormone replacement he is on.  He knows it does contain testosterone and is a gel that he rubs on his arm.  He states he has been doing that religiously in the morning since mid-March. Patient made aware if he is on a type of testosterone supplement close monitoring of CBC, testosterone, PSA and lipids would be needed.  It could put him at more risk for blood clots.  He will discontinue supplement.  He states he does not feel he was gaining any benefit from use. - Testosterone - PSA  He is planning a trip to [place] very soon.  Discussed with him there is no reason why he would not be able to go on his trip.  However, did educate him on frequent stops and stretching since they are driving.   Reviewed expectations re: course of current medical issues.  Discussed self-management of symptoms.  Outlined signs and symptoms indicating need for more acute intervention.  Patient verbalized understanding and all questions were answered.  Patient received an After-Visit Summary.  Continue routine follow-up per prior office visit.  Orders Placed This Encounter  Procedures  . Testosterone  . PSA  . D-Dimer, Quantitative   Meds ordered this encounter  Medications  . atorvastatin (LIPITOR) 40 MG tablet    Sig: Take 1 tablet (40 mg total) by mouth daily.    Dispense:  90 tablet    Refill:  3   Referral Orders  No referral(s) requested today     Note is dictated utilizing voice recognition software. Although note has been proof read prior to signing, occasional typographical errors still can be missed. If  any questions arise, please do not hesitate to call for verification.   electronically signed by:  Howard Pouch, DO  [place]

## 2019-12-07 ENCOUNTER — Telehealth: Payer: Self-pay | Admitting: Family Medicine

## 2019-12-07 DIAGNOSIS — R972 Elevated prostate specific antigen [PSA]: Secondary | ICD-10-CM

## 2019-12-07 LAB — D-DIMER, QUANTITATIVE: D-Dimer, Quant: 0.52 mcg/mL FEU — ABNORMAL HIGH (ref <0.50)

## 2019-12-07 NOTE — Telephone Encounter (Signed)
Urology referral placed for elevated PSA- please make pt aware.

## 2019-12-07 NOTE — Telephone Encounter (Signed)
PT was called and given information/results. Faxed copy of labs to [facility]. He cannot get pic of label on bottle due to writing being so small. He will bring bottle to office for PCP to look at. He will call urology and let us know if he needs referral.

## 2019-12-07 NOTE — Addendum Note (Signed)
Addended by: Howard Pouch A on: 12/07/2019 02:49 PM   Modules accepted: Orders

## 2019-12-07 NOTE — Telephone Encounter (Signed)
Please inform patient the following information: His D-dimer is normal for age, therefore a blood clot currently is less likely. His testosterone is within normal range for age. His PSA is mildly elevated to 5.99> I would recommend he contact his urologist with these findings.  Please ask him who his urologist is and we can fax over the results as well.  They may want to see him back and repeat PSA.  Also have been greater than 2-3 years he may need referral placed back to them.   Please ask him if he sent  pictures of the bottle of the hormone supplement his chiropractor prescribed so that we can see if there is active hormone replacement, and if so what percent?  He was going to upload them to his my chart.

## 2019-12-07 NOTE — Telephone Encounter (Signed)
Spoke with [facility] and they said his Physician is no longer there and it has been over several years since he has been seen. Pt will need referral to come back. Pt notified that referral would be placed for him.

## 2019-12-07 NOTE — Telephone Encounter (Signed)
Pt aware referral was placed.

## 2019-12-08 ENCOUNTER — Encounter: Payer: Self-pay | Admitting: Family Medicine

## 2019-12-08 NOTE — Telephone Encounter (Signed)
Pt dropped off Bottle of hormone supplement. Placed on Dr Lucita Lora desk to review.

## 2019-12-15 NOTE — Telephone Encounter (Signed)
Pt was called and message was left to check my chart and medication would be left up front for him to pick up.

## 2019-12-15 NOTE — Telephone Encounter (Signed)
Please call patient and inform him of the information below: The enhancement cream ingredients are listed in a way that is nondescript or generic.  The sources of testosterone, somatropin, DHEA, IGF-1 are not provided.  In the labeling of "10x, 30x, 100x potency "is not legitimate labeling either. Since this is not regulated by the FDA (yet), but seems to have caught the eye of the FDA secondary to nondescript  labeling of potentially harmful substances, there is not much in the way of information available.   Legitimate research on the product is currently sparse. It has proven to not be effective, but does carry risks as taking any hormone supplementation blood clots, cardiovascular events such as heart attack strokes, and higher risk of certain cancers.  This should not be used in anybody with cardiovascular disease or personal/family history of breast, ovarian or prostate cancers.  I would not recommend continuation of this product.  Please place patient's product out front so he can pick up at his convenience-if he desires.

## 2019-12-16 NOTE — Telephone Encounter (Signed)
Tried to contact pt again, Left VM again telling him to check my chart for info on meds and med was up front for him to pick up

## 2020-01-19 ENCOUNTER — Telehealth (INDEPENDENT_AMBULATORY_CARE_PROVIDER_SITE_OTHER): Payer: Medicare Other | Admitting: Family Medicine

## 2020-01-19 ENCOUNTER — Encounter: Payer: Self-pay | Admitting: Family Medicine

## 2020-01-19 ENCOUNTER — Other Ambulatory Visit: Payer: Self-pay

## 2020-01-19 VITALS — BP 141/82 | Temp 98.3°F

## 2020-01-19 DIAGNOSIS — J029 Acute pharyngitis, unspecified: Secondary | ICD-10-CM | POA: Diagnosis not present

## 2020-01-19 DIAGNOSIS — B349 Viral infection, unspecified: Secondary | ICD-10-CM

## 2020-01-19 DIAGNOSIS — R490 Dysphonia: Secondary | ICD-10-CM

## 2020-01-19 DIAGNOSIS — R5383 Other fatigue: Secondary | ICD-10-CM | POA: Diagnosis not present

## 2020-01-19 NOTE — Progress Notes (Signed)
Virtual Visit via Video Note  I connected with pt on 01/19/20 at  4:00 PM EDT by a video enabled telemedicine application and verified that I am speaking with the correct person using two identifiers.  Location patient: home Location provider:work or home office Persons participating in the virtual visit: patient, provider  I discussed the limitations of evaluation and management by telemedicine and the availability of in person appointments. The patient expressed understanding and agreed to proceed.  Telemedicine visit is a necessity given the COVID-19 restrictions in place at the current time.  HPI: 74 y/o male being seen today for about 9-10d hx of feeling very fatigued. Has ST, cold chills, diffuse achiness in upper body, mild HA.  Symptoms seem to wax and wane.  Voice raspy/faint during this.   No actual fever by thermometer. No signif runny nose, nasal congestion, or cough.  No nausea.  Appetite is fine. BP fine.   No rashes.  No known recent tick bites. No known sick contacts. He has had his covid 19 vaccine back in April. Has had some purposeful wt loss the last 5-6 wks, about 20 lbs---better diet.  ROS:  no SOB, no wheezing, no cough, no dizziness.  No melena or hematochezia.  No polyuria or polydipsia.  No joint swelling or focal joint pains.   No focal weakness, paresthesias, or tremors.  No acute vision or hearing abnormalities. No n/v/d or abd pain.  No palpitations.     Past Medical History:  Diagnosis Date  . Adrenal mass ([place]) 2018   Urology fully evaluated; benign  . Allergy    hay fever  . Anxiety   . Cataract   . Cervicalgia   . Complication of anesthesia    hypoglycemia, low blood pressure, pt. reports it dropped to zero- post op MORPHINE , then taken ICU due to either the morphine or hypoglycemia . In addition post op from cataract surgery- 07/2015, experienced an episode of hypoglycemia    . Depressive disorder, not elsewhere classified   . Diverticulosis    . Elevated prostate specific antigen (PSA) 2008   Dr Jeffie Pollock  . Gallstone   . H/O cardiovascular stress test >10 yrs.   told that its wnl. Told to drink less coffee  . Hematuria 2018   Hematuria with enlarged prostate, kidney cyst, adrenal adenoma--> followed by urology with cystoscopy and repeat CT, benign workup.  [name] Hx of migraines   . Hypertension   . Hypoglycemia   . IBS (irritable bowel syndrome)   . Inguinal hernia 12/2015   Bilateral small indirect hernias on CT, umbilical hernia also present.  . Kidney cyst, acquired 2018   Followed by urology, benign.  . Low back pain   . Macular degeneration (senile) of retina, unspecified    2 different opinions from 2 different Ophth  . Osteoarthrosis, unspecified whether generalized or localized, unspecified site   . Other and unspecified hyperlipidemia   . Prostatitis 2008  . Refusal of blood transfusions as patient is Jehovah's Witness   . Segmental and somatic dysfunction of cervical region   . Segmental and somatic dysfunction of lumbar region   . Segmental and somatic dysfunction of sacral region   . Segmental and somatic dysfunction of thoracic region   . Spondylosis of lumbar spine   . Strain of pelvis   . 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    Past Surgical History:  Procedure Laterality Date  . CATARACT EXTRACTION,  BILATERAL    . COLONOSCOPY  2012   Dr Olevia Perches  . CYSTOSCOPY     for hematuria  . hydrocoelectomy  2003   Dr Jeffie Pollock  . KNEE ARTHROSCOPY Left 1994  . prostate nodule resection  2003  . TONSILLECTOMY    . TOTAL KNEE ARTHROPLASTY  2009  . TOTAL KNEE ARTHROPLASTY Right 06/27/2016   Procedure: TOTAL KNEE ARTHROPLASTY;  Surgeon: Leandrew Koyanagi, MD;  Location: [facility];  Service: Orthopedics;  Laterality: Right;    Family History  Problem Relation Age of Onset  . Lung cancer Father        smoker  . Alcohol abuse Father   . Tuberculosis Father   . Heart disease Father   . Early  death Father   . Diabetes Other   . Heart disease Mother   . Heart attack Brother   . Stroke Neg Hx       Current Outpatient Medications:  .  amLODipine (NORVASC) 10 MG tablet, Take 1 tablet (10 mg total) by mouth daily., Disp: 90 tablet, Rfl: 1 .  aspirin EC 81 MG tablet, Take 81 mg by mouth daily., Disp: , Rfl:  .  b complex vitamins tablet, Take 1 tablet by mouth every other day., Disp: , Rfl:  .  Cholecalciferol (VITAMIN D-3) 25 MCG (1000 UT) CAPS, Take 1,000 Units by mouth daily with breakfast., Disp: , Rfl:  .  lisinopril (ZESTRIL) 30 MG tablet, Take 1 tablet (30 mg total) by mouth daily. Needs office visit prior to any additional refills, Disp: 90 tablet, Rfl: 1 .  MULTIPLE VITAMIN PO, Take 1 tablet by mouth daily., Disp: , Rfl:  .  Vitamin A 2250 MCG (7500 UT) CAPS, Take 7,500 Units by mouth daily. , Disp: , Rfl:  .  vitamin C (ASCORBIC ACID) 500 MG tablet, Take 1,000 mg by mouth daily. , Disp: , Rfl:  .  atorvastatin (LIPITOR) 40 MG tablet, Take 1 tablet (40 mg total) by mouth daily. (Patient not taking: Reported on 01/19/2020), Disp: 90 tablet, Rfl: 3  EXAM:  VITALS per patient if applicable: BP (!) 703/50 (BP Location: Left Arm, Patient Position: Sitting, Cuff Size: Large)   Temp 98.3 F (36.8 C) (Oral)    GENERAL: alert, oriented, appears well and in no acute distress  HEENT: atraumatic, conjunttiva clear, no obvious abnormalities on inspection of external nose and ears  NECK: normal movements of the head and neck  LUNGS: on inspection no signs of respiratory distress, breathing rate appears normal, no obvious gross SOB, gasping or wheezing  CV: no obvious cyanosis  MS: moves all visible extremities without noticeable abnormality  PSYCH/NEURO: pleasant and cooperative, no obvious depression or anxiety, speech and thought processing grossly intact  LABS: none today    Chemistry      Component Value Date/Time   NA 139 11/30/2019 0952   K 4.0 11/30/2019 0952    CL 104 11/30/2019 0952   CO2 23 11/30/2019 0952   BUN 12 11/30/2019 0952   CREATININE 1.14 11/30/2019 0952   CREATININE 1.06 04/20/2018 1010      Component Value Date/Time   CALCIUM 9.0 11/30/2019 0952   ALKPHOS 57 01/11/2019 1446   AST 17 01/11/2019 1446   ALT 16 01/11/2019 1446   BILITOT 0.4 01/11/2019 1446     Lab Results  Component Value Date   WBC 8.1 11/30/2019   HGB 13.9 11/30/2019   HCT 41.8 11/30/2019   MCV 91.3 11/30/2019   PLT 286  11/30/2019   Lab Results  Component Value Date   HGBA1C 5.3 04/20/2018    ASSESSMENT AND PLAN:  Discussed the following assessment and plan:  Viral syndrome suspected. Tick-born illness less likely. He looks good today. I recommended either a "watchful waiting" approach and use tylenol prn achiness OR empiric use of doxycycline for possible tick-born illness. He and wife were comfortable with watchful waiting approach. Signs/symptoms to call or return for were reviewed and pt expressed understanding.  -we discussed possible serious and likely etiologies, options for evaluation and workup, limitations of telemedicine visit vs in person visit, treatment, treatment risks and precautions. Pt prefers to treat via telemedicine empirically rather then risking or undertaking an in person visit at this moment. Patient agrees to seek prompt in person care if worsening, new symptoms arise, or if is not improving with treatment.   I discussed the assessment and treatment plan with the patient. The patient was provided an opportunity to ask questions and all were answered. The patient agreed with the plan and demonstrated an understanding of the instructions.   The patient was advised to call back or seek an in-person evaluation if the symptoms worsen or if the condition fails to improve as anticipated.  F/u: call and make appt if not signif improved in 4-5 days.  Signed:  Crissie Sickles, MD           01/19/2020

## 2020-01-24 ENCOUNTER — Telehealth: Payer: Self-pay

## 2020-01-24 NOTE — Telephone Encounter (Signed)
Pt was called and detailed VM was left and told we had no openings until the beginning of next week so he would have to go to urgent care since no better or would have to wait to be seen (which he was told this would not be recommended). Pt asked to call back.

## 2020-01-24 NOTE — Telephone Encounter (Signed)
Still not any better, requested to be seen in office,  Please advise [phone]

## 2020-01-28 ENCOUNTER — Encounter: Payer: Self-pay | Admitting: Family Medicine

## 2020-01-28 ENCOUNTER — Other Ambulatory Visit: Payer: Self-pay

## 2020-01-28 ENCOUNTER — Ambulatory Visit (INDEPENDENT_AMBULATORY_CARE_PROVIDER_SITE_OTHER): Payer: Medicare Other | Admitting: Family Medicine

## 2020-01-28 ENCOUNTER — Telehealth: Payer: Self-pay | Admitting: Family Medicine

## 2020-01-28 VITALS — BP 131/72 | HR 66 | Temp 98.5°F | Resp 18 | Ht 71.0 in | Wt 179.4 lb

## 2020-01-28 DIAGNOSIS — R5383 Other fatigue: Secondary | ICD-10-CM | POA: Diagnosis not present

## 2020-01-28 DIAGNOSIS — J329 Chronic sinusitis, unspecified: Secondary | ICD-10-CM | POA: Diagnosis not present

## 2020-01-28 DIAGNOSIS — B9689 Other specified bacterial agents as the cause of diseases classified elsewhere: Secondary | ICD-10-CM

## 2020-01-28 DIAGNOSIS — D649 Anemia, unspecified: Secondary | ICD-10-CM

## 2020-01-28 DIAGNOSIS — D619 Aplastic anemia, unspecified: Secondary | ICD-10-CM

## 2020-01-28 LAB — COMPREHENSIVE METABOLIC PANEL
ALT: 18 U/L (ref 0–53)
AST: 15 U/L (ref 0–37)
Albumin: 3.6 g/dL (ref 3.5–5.2)
Alkaline Phosphatase: 84 U/L (ref 39–117)
BUN: 15 mg/dL (ref 6–23)
CO2: 28 mEq/L (ref 19–32)
Calcium: 9.3 mg/dL (ref 8.4–10.5)
Chloride: 102 mEq/L (ref 96–112)
Creatinine, Ser: 0.87 mg/dL (ref 0.40–1.50)
GFR: 85.75 mL/min (ref >60.00)
Glucose, Bld: 90 mg/dL (ref 70–99)
Potassium: 4.6 mEq/L (ref 3.5–5.1)
Sodium: 137 mEq/L (ref 135–145)
Total Bilirubin: 0.4 mg/dL (ref 0.2–1.2)
Total Protein: 6 g/dL (ref 6.0–8.3)

## 2020-01-28 LAB — CBC WITH DIFFERENTIAL/PLATELET
Basophils Absolute: 0 10*3/uL (ref 0.0–0.1)
Basophils Relative: 0.3 % (ref 0.0–3.0)
Eosinophils Absolute: 0.1 10*3/uL (ref 0.0–0.7)
Eosinophils Relative: 0.8 % (ref 0.0–5.0)
HCT: 36.5 % — ABNORMAL LOW (ref 39.0–52.0)
Hemoglobin: 12.4 g/dL — ABNORMAL LOW (ref 13.0–17.0)
Lymphocytes Relative: 23.3 % (ref 12.0–46.0)
Lymphs Abs: 1.7 10*3/uL (ref 0.7–4.0)
MCHC: 33.8 g/dL (ref 30.0–36.0)
MCV: 87 fl (ref 78.0–100.0)
Monocytes Absolute: 0.8 10*3/uL (ref 0.1–1.0)
Monocytes Relative: 10.2 % (ref 3.0–12.0)
Neutro Abs: 4.9 10*3/uL (ref 1.4–7.7)
Neutrophils Relative %: 65.4 % (ref 43.0–77.0)
Platelets: 334 10*3/uL (ref 150.0–400.0)
RBC: 4.2 Mil/uL — ABNORMAL LOW (ref 4.22–5.81)
RDW: 13.5 % (ref 11.5–15.5)
WBC: 7.5 10*3/uL (ref 4.0–10.5)

## 2020-01-28 MED ORDER — FEXOFENADINE HCL 180 MG PO TABS: 180.0000 mg | ORAL_TABLET | Freq: Every day | ORAL | 0 refills | Status: DC

## 2020-01-28 MED ORDER — DOXYCYCLINE HYCLATE 100 MG PO TABS
100.0000 mg | ORAL_TABLET | Freq: Two times a day (BID) | ORAL | 0 refills | Status: DC
Start: 2020-01-28 — End: 2020-02-04

## 2020-01-28 MED ORDER — FLUTICASONE PROPIONATE 50 MCG/ACT NA SUSP
2.0000 | Freq: Every day | NASAL | 0 refills | Status: DC
Start: 2020-01-28 — End: 2020-02-21

## 2020-01-28 NOTE — Patient Instructions (Signed)
Rest, hydrate.  Start flonase and Human resources officer as discussed.  doxycyline prescribed, take until completed.  If cough present it can last up to 6-8 weeks.  F/U 2 weeks of not improved.   We will call you with lab results.     Sinusitis, Adult Sinusitis is soreness and swelling (inflammation) of your sinuses. Sinuses are hollow spaces in the bones around your face. They are located:  Around your eyes.  In the middle of your forehead.  Behind your nose.  In your cheekbones. Your sinuses and nasal passages are lined with a fluid called mucus. Mucus drains out of your sinuses. Swelling can trap mucus in your sinuses. This lets germs (bacteria, virus, or fungus) grow, which leads to infection. Most of the time, this condition is caused by a virus. What are the causes? This condition is caused by:  Allergies.  Asthma.  Germs.  Things that block your nose or sinuses.  Growths in the nose (nasal polyps).  Chemicals or irritants in the air.  Fungus (rare). What increases the risk? You are more likely to develop this condition if:  You have a weak body defense system (immune system).  You do a lot of swimming or diving.  You use nasal sprays too much.  You smoke. What are the signs or symptoms? The main symptoms of this condition are pain and a feeling of pressure around the sinuses. Other symptoms include:  Stuffy nose (congestion).  Runny nose (drainage).  Swelling and warmth in the sinuses.  Headache.  Toothache.  A cough that may get worse at night.  Mucus that collects in the throat or the back of the nose (postnasal drip).  Being unable to smell and taste.  Being very tired (fatigue).  A fever.  Sore throat.  Bad breath. How is this diagnosed? This condition is diagnosed based on:  Your symptoms.  Your medical history.  A physical exam.  Tests to find out if your condition is short-term (acute) or long-term (chronic). Your doctor may: ? Check  your nose for growths (polyps). ? Check your sinuses using a tool that has a light (endoscope). ? Check for allergies or germs. ? Do imaging tests, such as an MRI or CT scan. How is this treated? Treatment for this condition depends on the cause and whether it is short-term or long-term.  If caused by a virus, your symptoms should go away on their own within 10 days. You may be given medicines to relieve symptoms. They include: ? Medicines that shrink swollen tissue in the nose. ? Medicines that treat allergies (antihistamines). ? A spray that treats swelling of the nostrils. ? Rinses that help get rid of thick mucus in your nose (nasal saline washes).  If caused by bacteria, your doctor may wait to see if you will get better without treatment. You may be given antibiotic medicine if you have: ? A very bad infection. ? A weak body defense system.  If caused by growths in the nose, you may need to have surgery. Follow these instructions at home: Medicines  Take, use, or apply over-the-counter and prescription medicines only as told by your doctor. These may include nasal sprays.  If you were prescribed an antibiotic medicine, take it as told by your doctor. Do not stop taking the antibiotic even if you start to feel better. Hydrate and humidify   Drink enough water to keep your pee (urine) pale yellow.  Use a cool mist humidifier to keep the humidity level  in your home above 50%.  Breathe in steam for 10-15 minutes, 3-4 times a day, or as told by your doctor. You can do this in the bathroom while a hot shower is running.  Try not to spend time in cool or dry air. Rest  Rest as much as you can.  Sleep with your head raised (elevated).  Make sure you get enough sleep each night. General instructions   Put a warm, moist washcloth on your face 3-4 times a day, or as often as told by your doctor. This will help with discomfort.  Wash your hands often with soap and water. If  there is no soap and water, use hand sanitizer.  Do not smoke. Avoid being around people who are smoking (secondhand smoke).  Keep all follow-up visits as told by your doctor. This is important. Contact a doctor if:  You have a fever.  Your symptoms get worse.  Your symptoms do not get better within 10 days. Get help right away if:  You have a very bad headache.  You cannot stop throwing up (vomiting).  You have very bad pain or swelling around your face or eyes.  You have trouble seeing.  You feel confused.  Your neck is stiff.  You have trouble breathing. Summary  Sinusitis is swelling of your sinuses. Sinuses are hollow spaces in the bones around your face.  This condition is caused by tissues in your nose that become inflamed or swollen. This traps germs. These can lead to infection.  If you were prescribed an antibiotic medicine, take it as told by your doctor. Do not stop taking it even if you start to feel better.  Keep all follow-up visits as told by your doctor. This is important. This information is not intended to replace advice given to you by your health care provider. Make sure you discuss any questions you have with your health care provider. Document Revised: 12/01/2017 Document Reviewed: 12/01/2017 Elsevier Patient Education  [place].

## 2020-01-28 NOTE — Telephone Encounter (Signed)
Please call patient: -There are no signs of severe infection per labs and his liver/kidney function and electrolytes are normal. - He has signs of anemia, lower than normal blood counts, on lab collection from today with a hemoglobin of 12.4 and a hematocrit of 36.5.  This is new for him and the only time he is ever been anemic,  as far back as I can see,  is when you had his orthopedic surgery 2017.  Anemia will cause fatigue and feelings of being winded.  It is important we find the cause of anemia.  Has he experienced any blood loss or any changes in bowel habits?  Please advise, if he has we will need to get him to GI urgently.   1.  Please schedule him for a lab appointment on Monday to have iron panel collected and provide him with FOBT cards to complete.  Please explain FOBT cards and purpose (to evaluate if any blood loss via colon).   2.  If his symptoms worsen or he has notable blood per rectum, palpitations or dizziness he should be seen in the emergency room for evaluation.  If he is bleeding from his GI tract this can become more serious situation if his hemoglobin drops suddenly or further.   Continue with plan and medications we discussed today and additional labs to work-up new anemia.  Once we get lab results back next week, we will discuss further plan, depending upon those results.

## 2020-01-28 NOTE — Telephone Encounter (Signed)
Pt/wife were called and given information. Pt is scheduled for Monday. Wife and pt verbalized understanding. Pt states he has had no change in bowel habits and has not had any blood loss. He does have to add extra fiber to his diet in order to have regular bowel movements.

## 2020-01-28 NOTE — Progress Notes (Signed)
This visit occurred during the SARS-CoV-2 public health emergency.  Safety protocols were in place, including screening questions prior to the visit, additional usage of staff PPE, and extensive cleaning of exam room while observing appropriate contact time as indicated for disinfecting solutions.    Nathan [name] , 06/19/1946, 74 y.o., male MRN: [id number] Patient Care Team    Relationship Specialty Notifications Start End  Ma Hillock, DO PCP - General Family Medicine  12/07/15   Pa, [facility]    06/02/17   Leandrew Koyanagi, MD Attending Physician Orthopedic Surgery  06/02/17     Chief Complaint  Patient presents with  . Fatigue    Pt is still fatigued and has hoarsness in throat. Always runs a low grade fever in the evening.      Subjective: Pt presents for an OV with complaints of fatigue.  Patient was seen by another provider on January 19, 2020.  At that time he had 10 days history of fatigue, sore throat, cold chills, achiness and headache.  He was felt to have post viral syndrome .  Reports he still has fatigue and some hoarseness and throat.  He has had a low-grade fever over the last few days.  He does feel all of his symptoms have mildly improved today, with the exception of the fatigue.  He does endorse feeling more winded with activity than prior.  He denies nausea, vomit or bowel changes.  He denies any bleeding per rectum.  Patient has completed his Covid series.  Of note he was seen this time last year with vertigo which led to a syncopal episode. Depression screen [facility] 2/9 04/20/2018 06/02/2017 05/12/2017 08/24/2015 08/24/2015  Decreased Interest 0 0 0 2 2  Down, Depressed, Hopeless 0 0 0 2 2  PHQ - 2 Score 0 0 0 4 4  Altered sleeping 0 0 - 0 0  Tired, decreased energy 1 0 - 3 2  Change in appetite 0 0 - 2 1  Feeling bad or failure about yourself  0 0 - 2 2  Trouble concentrating 0 0 - 2 2  Moving slowly or fidgety/restless 0 0 - 0 0  Suicidal thoughts 0 0  - 1 0  PHQ-9 Score 1 0 - 14 11  Difficult doing work/chores Not difficult at all Not difficult at all - Very difficult Somewhat difficult    Allergies  Allergen Reactions  . Morphine And Related Other (See Comments)    Severe drop in blood pressure  . Citalopram Hydrobromide Other (See Comments)    PATIENT PREFERENCE: "Just ineffective"  . Sertraline Hcl Other (See Comments)    Headache   Social History   Social History Narrative   Married. Wife's name is [name]. Full time employed, Pensions consultant.   Drinks caffeinated beverages. Take a multivitamin. Wears his seatbelt.   Exercises at least 3 times a week. Is not a strict vegetarian, but only eats meat 1-2 times a week   Wears a hearing aid. Does not wear dentures or use an assistive walking device.   There is a smoke detector in his home.   He feels safe in his relationships.   Past Medical History:  Diagnosis Date  . Adrenal mass ([place]) 2018   Urology fully evaluated; benign  . Allergy    hay fever  . Anxiety   . Cataract   . Cervicalgia   . Complication of anesthesia    hypoglycemia, low blood pressure,  pt. reports it dropped to zero- post op MORPHINE , then taken ICU due to either the morphine or hypoglycemia . In addition post op from cataract surgery- 07/2015, experienced an episode of hypoglycemia    . Depressive disorder, not elsewhere classified   . Diverticulosis   . Elevated prostate specific antigen (PSA) 2008   Dr Jeffie Pollock  . Gallstone   . H/O cardiovascular stress test >10 yrs.   told that its wnl. Told to drink less coffee  . Hematuria 2018   Hematuria with enlarged prostate, kidney cyst, adrenal adenoma--> followed by urology with cystoscopy and repeat CT, benign workup.  [name] Hx of migraines   . Hypertension   . Hypoglycemia   . IBS (irritable bowel syndrome)   . Inguinal hernia 12/2015   Bilateral small indirect hernias on CT, umbilical hernia also present.  . Kidney cyst, acquired 2018    Followed by urology, benign.  . Low back pain   . Macular degeneration (senile) of retina, unspecified    2 different opinions from 2 different Ophth  . Osteoarthrosis, unspecified whether generalized or localized, unspecified site   . Other and unspecified hyperlipidemia   . Prostatitis 2008  . Refusal of blood transfusions as patient is Jehovah's Witness   . Segmental and somatic dysfunction of cervical region   . Segmental and somatic dysfunction of lumbar region   . Segmental and somatic dysfunction of sacral region   . Segmental and somatic dysfunction of thoracic region   . Spondylosis of lumbar spine   . Strain of pelvis   . 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   Past Surgical History:  Procedure Laterality Date  . CATARACT EXTRACTION, BILATERAL    . COLONOSCOPY  2012   Dr Olevia Perches  . CYSTOSCOPY     for hematuria  . hydrocoelectomy  2003   Dr Jeffie Pollock  . KNEE ARTHROSCOPY Left 1994  . prostate nodule resection  2003  . TONSILLECTOMY    . TOTAL KNEE ARTHROPLASTY  2009  . TOTAL KNEE ARTHROPLASTY Right 06/27/2016   Procedure: TOTAL KNEE ARTHROPLASTY;  Surgeon: Leandrew Koyanagi, MD;  Location: [facility];  Service: Orthopedics;  Laterality: Right;   Family History  Problem Relation Age of Onset  . Lung cancer Father        smoker  . Alcohol abuse Father   . Tuberculosis Father   . Heart disease Father   . Early death Father   . Diabetes Other   . Heart disease Mother   . Heart attack Brother   . Stroke Neg Hx    Allergies as of 01/28/2020      Reactions   Morphine And Related Other (See Comments)   Severe drop in blood pressure   Citalopram Hydrobromide Other (See Comments)   PATIENT PREFERENCE: "Just ineffective"   Sertraline Hcl Other (See Comments)   Headache      Medication List       Accurate as of January 28, 2020 11:59 PM. If you have any questions, ask your nurse or doctor.        amLODipine 10 MG tablet Commonly known as:  NORVASC Take 1 tablet (10 mg total) by mouth daily.   aspirin EC 81 MG tablet Take 81 mg by mouth daily.   atorvastatin 40 MG tablet Commonly known as: LIPITOR Take 1 tablet (40 mg total) by mouth daily.   b complex vitamins tablet Take 1 tablet by mouth every  other day.   doxycycline 100 MG tablet Commonly known as: VIBRA-TABS Take 1 tablet (100 mg total) by mouth 2 (two) times daily. Started by: Howard Pouch, DO   fexofenadine 180 MG tablet Commonly known as: Allegra Allergy Take 1 tablet (180 mg total) by mouth daily. Started by: Howard Pouch, DO   fluticasone 50 MCG/ACT nasal spray Commonly known as: FLONASE Place 2 sprays into both nostrils daily. Started by: Howard Pouch, DO   lisinopril 30 MG tablet Commonly known as: ZESTRIL Take 1 tablet (30 mg total) by mouth daily. Needs office visit prior to any additional refills   MULTIPLE VITAMIN PO Take 1 tablet by mouth daily.   Vitamin A 2250 MCG (7500 UT) Caps Take 7,500 Units by mouth daily.   vitamin C 500 MG tablet Commonly known as: ASCORBIC ACID Take 1,000 mg by mouth daily.   Vitamin D-3 25 MCG (1000 UT) Caps Take 1,000 Units by mouth daily with breakfast.       All past medical history, surgical history, allergies, family history, immunizations andmedications were updated in the EMR today and reviewed under the history and medication portions of their EMR.     ROS: Negative, with the exception of above mentioned in HPI   Objective:  BP 131/72 (BP Location: Right Arm, Patient Position: Sitting, Cuff Size: Normal)   Pulse 66   Temp 98.5 F (36.9 C) (Temporal)   Resp 18   Ht _0  (1.803 m)   Wt 179 lb 6 oz (81.4 kg)   SpO2 98%   BMI 25.02 kg/m  Body mass index is 25.02 kg/m. Gen: Afebrile. No acute distress. Nontoxic in appearance, well developed, well nourished.  HENT: AT. [place]. Bilateral TM visualized without erythema. MMM, no oral lesions. Bilateral nares with mild erythema, swelling and  drainage. Throat without erythema or exudates.  Mild cough and hoarseness present.  Mild erythema posterior pharynx.  Mild postnasal drip present.  Tender to palpation sinus cavities. Eyes:Pupils Equal Round Reactive to light, Extraocular movements intact,  Conjunctiva without redness, discharge or icterus. Neck/lymp/endocrine: Supple, no lymphadenopathy CV: RRR  Chest: CTAB, no wheeze or crackles.  Skin: No rashes, purpura or petechiae.  Neuro:  Normal gait. PERLA. EOMi. Alert. Oriented x3  Psych: Normal affect, dress and demeanor. Normal speech. Normal thought content and judgment.  No exam data present No results found. No results found for this or any previous visit (from the past 24 hour(s)).  Assessment/Plan: Nathan [name] is a 74 y.o. male present for OV for  Fatigue/dyspnea Fatigue possibly related to viral infection however he is more winded with activity which is not his routine.  He does appear more fatigued today. CBC and CMP collected today.  Rule out anemia, infection, liver/kidney or electrolyte disturbance. Patient does not appear fluid overloaded and his lung exam is rather normal today outside of occasional cough. Follow-up dependent upon lab results and clinical outcome.  Bacterial sinusitis Rest, hydrate.  Start Flonase and Allegra Doxycycline prescribed, take until completed.  If cough present it can last up to 6-8 weeks.  F/U 2 weeks of not improved.     Reviewed expectations re: course of current medical issues.  Discussed self-management of symptoms.  Outlined signs and symptoms indicating need for more acute intervention.  Patient verbalized understanding and all questions were answered.  Patient received an After-Visit Summary.    Orders Placed This Encounter  Procedures  . CBC w/Diff  . Comp Met (CMET)   Meds ordered this  encounter  Medications  . fexofenadine (ALLEGRA ALLERGY) 180 MG tablet    Sig: Take 1 tablet (180 mg total) by mouth  daily.    Dispense:  30 tablet    Refill:  0  . fluticasone (FLONASE) 50 MCG/ACT nasal spray    Sig: Place 2 sprays into both nostrils daily.    Dispense:  16 g    Refill:  0  . doxycycline (VIBRA-TABS) 100 MG tablet    Sig: Take 1 tablet (100 mg total) by mouth 2 (two) times daily.    Dispense:  20 tablet    Refill:  0   Referral Orders  No referral(s) requested today     Note is dictated utilizing voice recognition software. Although note has been proof read prior to signing, occasional typographical errors still can be missed. If any questions arise, please do not hesitate to call for verification.   electronically signed by:  Howard Pouch, DO  [place]

## 2020-01-29 DIAGNOSIS — Z20822 Contact with and (suspected) exposure to covid-19: Secondary | ICD-10-CM | POA: Diagnosis not present

## 2020-01-29 DIAGNOSIS — Z03818 Encounter for observation for suspected exposure to other biological agents ruled out: Secondary | ICD-10-CM | POA: Diagnosis not present

## 2020-01-31 ENCOUNTER — Other Ambulatory Visit: Payer: Self-pay

## 2020-01-31 ENCOUNTER — Ambulatory Visit (INDEPENDENT_AMBULATORY_CARE_PROVIDER_SITE_OTHER): Payer: Medicare Other | Admitting: Family Medicine

## 2020-01-31 DIAGNOSIS — D649 Anemia, unspecified: Secondary | ICD-10-CM | POA: Diagnosis not present

## 2020-02-01 ENCOUNTER — Other Ambulatory Visit: Payer: Self-pay | Admitting: Family Medicine

## 2020-02-01 DIAGNOSIS — D649 Anemia, unspecified: Secondary | ICD-10-CM

## 2020-02-01 LAB — IRON,TIBC AND FERRITIN PANEL
%SAT: 22 % (calc) (ref 20–48)
Ferritin: 116 ng/mL (ref 24–380)
Iron: 66 ug/dL (ref 50–180)
TIBC: 303 mcg/dL (calc) (ref 250–425)

## 2020-02-04 ENCOUNTER — Encounter: Payer: Self-pay | Admitting: Gastroenterology

## 2020-02-04 ENCOUNTER — Telehealth: Payer: Self-pay | Admitting: Family Medicine

## 2020-02-04 ENCOUNTER — Encounter: Payer: Self-pay | Admitting: Family Medicine

## 2020-02-04 ENCOUNTER — Ambulatory Visit (INDEPENDENT_AMBULATORY_CARE_PROVIDER_SITE_OTHER): Payer: Medicare Other | Admitting: Family Medicine

## 2020-02-04 ENCOUNTER — Other Ambulatory Visit: Payer: Self-pay

## 2020-02-04 VITALS — BP 137/75 | HR 74 | Temp 98.7°F | Ht 71.0 in | Wt 176.0 lb

## 2020-02-04 DIAGNOSIS — D649 Anemia, unspecified: Secondary | ICD-10-CM

## 2020-02-04 DIAGNOSIS — R350 Frequency of micturition: Secondary | ICD-10-CM | POA: Diagnosis not present

## 2020-02-04 DIAGNOSIS — R1013 Epigastric pain: Secondary | ICD-10-CM

## 2020-02-04 DIAGNOSIS — R5383 Other fatigue: Secondary | ICD-10-CM

## 2020-02-04 DIAGNOSIS — E0590 Thyrotoxicosis, unspecified without thyrotoxic crisis or storm: Secondary | ICD-10-CM | POA: Insufficient documentation

## 2020-02-04 DIAGNOSIS — R490 Dysphonia: Secondary | ICD-10-CM

## 2020-02-04 DIAGNOSIS — R972 Elevated prostate specific antigen [PSA]: Secondary | ICD-10-CM

## 2020-02-04 DIAGNOSIS — R1011 Right upper quadrant pain: Secondary | ICD-10-CM

## 2020-02-04 DIAGNOSIS — R195 Other fecal abnormalities: Secondary | ICD-10-CM

## 2020-02-04 LAB — TSH: TSH: <0.01 u[IU]/mL — ABNORMAL LOW (ref 0.35–4.50)

## 2020-02-04 LAB — SEDIMENTATION RATE: Sed Rate: 17 mm/hr (ref 0–20)

## 2020-02-04 LAB — C-REACTIVE PROTEIN: CRP: 1.8 mg/dL (ref 0.5–20.0)

## 2020-02-04 LAB — H. PYLORI ANTIBODY, IGG: H Pylori IgG: NEGATIVE

## 2020-02-04 LAB — HEMOCCULT SLIDES (X 3 CARDS)
OCCULT 1: NEGATIVE
OCCULT 2: POSITIVE — AB
OCCULT 3: POSITIVE — AB

## 2020-02-04 MED ORDER — OMEPRAZOLE 20 MG PO CPDR: 20.0000 mg | DELAYED_RELEASE_CAPSULE | Freq: Two times a day (BID) | ORAL | 2 refills | Status: DC

## 2020-02-04 MED ORDER — SUCRALFATE 1 G PO TABS: 1.0000 g | ORAL_TABLET | Freq: Three times a day (TID) | ORAL | 1 refills | Status: DC

## 2020-02-04 NOTE — Telephone Encounter (Signed)
Please inform patient his thyroid test came back abnormal.  His thyroid is hyperactive.  This needs further evaluated urgently with endocrinology. A hyperactive thyroid will cause extreme fatigue, heart pounding.   It will not cause rectal bleeding so that is a separate issue still and he still needs to see GI urgently.  H pylori test is negative Inflammatory markers are normal Urinalysis pending and will be back till Monday.  Take medications as prescribed today.  And he will hear from [place] on Monday with further instructions.

## 2020-02-04 NOTE — Progress Notes (Signed)
This visit occurred during the SARS-CoV-2 public health emergency.  Safety protocols were in place, including screening questions prior to the visit, additional usage of staff PPE, and extensive cleaning of exam room while observing appropriate contact time as indicated for disinfecting solutions.    Nathan [name] , 10/11/1945, 74 y.o., male MRN: [id number] Patient Care Team    Relationship Specialty Notifications Start End  Ma Hillock, DO PCP - General Family Medicine  12/07/15   Pa, [facility]    06/02/17   Leandrew Koyanagi, MD Attending Physician Orthopedic Surgery  06/02/17     Chief Complaint  Patient presents with  . Follow-up    on labs      Subjective: Pt presents for an OV with complaints of fatigue.  Patient presents today with his wife to follow-up on fatigue.  He was seen January 28, 2020 after first visit of January 19, 2020.  CMP, iron panel, CRP, ESR within normal limits.CBC with new mild anemia with a hemoglobin of 12.4/hematocrit 36.5.  FOBT cards were positive x2.  He presents today and states that he is still "extremely fatigued "worse at the end of the day.  He also noted some new stomach pain that is right of umbilicus and epigastric.  He states it hurts to press over his stomach.  He still endorses increased heart rate with activity. He is eating and drinking well.  He denies any dysuria, but does endorse more frequency of urination.  He had a mildly elevated PSA of 5.99 a few months ago.  He has had mildly elevated PSAs for some time and is followed by urology.  He does use NSAIDs on occasions.  He does not consume alcohol. Prior note: Patient was seen by another provider on January 19, 2020.  At that time he had 10 days history of fatigue, sore throat, cold chills, achiness and headache.  He was felt to have post viral syndrome .  Reports he still has fatigue and some hoarseness and throat.  He has had a low-grade fever over the last few days.  He does feel  all of his symptoms have mildly improved today, with the exception of the fatigue.  He does endorse feeling more winded with activity than prior.  He denies nausea, vomit or bowel changes.  He denies any bleeding per rectum.  Patient has completed his Covid series.  Of note he was seen this time last year with vertigo which led to a syncopal episode. Depression screen [facility] 2/9 04/20/2018 06/02/2017 05/12/2017 08/24/2015 08/24/2015  Decreased Interest 0 0 0 2 2  Down, Depressed, Hopeless 0 0 0 2 2  PHQ - 2 Score 0 0 0 4 4  Altered sleeping 0 0 - 0 0  Tired, decreased energy 1 0 - 3 2  Change in appetite 0 0 - 2 1  Feeling bad or failure about yourself  0 0 - 2 2  Trouble concentrating 0 0 - 2 2  Moving slowly or fidgety/restless 0 0 - 0 0  Suicidal thoughts 0 0 - 1 0  PHQ-9 Score 1 0 - 14 11  Difficult doing work/chores Not difficult at all Not difficult at all - Very difficult Somewhat difficult    Allergies  Allergen Reactions  . Morphine And Related Other (See Comments)    Severe drop in blood pressure  . Citalopram Hydrobromide Other (See Comments)    PATIENT PREFERENCE: "Just ineffective"  . Sertraline Hcl Other (  See Comments)    Headache   Social History   Social History Narrative   Married. Wife's name is [name]. Full time employed, Pensions consultant.   Drinks caffeinated beverages. Take a multivitamin. Wears his seatbelt.   Exercises at least 3 times a week. Is not a strict vegetarian, but only eats meat 1-2 times a week   Wears a hearing aid. Does not wear dentures or use an assistive walking device.   There is a smoke detector in his home.   He feels safe in his relationships.   Past Medical History:  Diagnosis Date  . Adrenal mass ([place]) 2018   Urology fully evaluated; benign  . Allergy    hay fever  . Anxiety   . Cataract   . Cervicalgia   . Complication of anesthesia    hypoglycemia, low blood pressure, pt. reports it dropped to zero- post op MORPHINE , then  taken ICU due to either the morphine or hypoglycemia . In addition post op from cataract surgery- 07/2015, experienced an episode of hypoglycemia    . Depressive disorder, not elsewhere classified   . Diverticulosis   . Elevated prostate specific antigen (PSA) 2008   Dr Jeffie Pollock  . Gallstone   . H/O cardiovascular stress test >10 yrs.   told that its wnl. Told to drink less coffee  . Hematuria 2018   Hematuria with enlarged prostate, kidney cyst, adrenal adenoma--> followed by urology with cystoscopy and repeat CT, benign workup.  [name] Hx of migraines   . Hypertension   . Hypoglycemia   . IBS (irritable bowel syndrome)   . Inguinal hernia 12/2015   Bilateral small indirect hernias on CT, umbilical hernia also present.  . Kidney cyst, acquired 2018   Followed by urology, benign.  . Low back pain   . Macular degeneration (senile) of retina, unspecified    2 different opinions from 2 different Ophth  . Osteoarthrosis, unspecified whether generalized or localized, unspecified site   . Other and unspecified hyperlipidemia   . Prostatitis 2008  . Refusal of blood transfusions as patient is Jehovah's Witness   . Segmental and somatic dysfunction of cervical region   . Segmental and somatic dysfunction of lumbar region   . Segmental and somatic dysfunction of sacral region   . Segmental and somatic dysfunction of thoracic region   . Spondylosis of lumbar spine   . Strain of pelvis   . 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   Past Surgical History:  Procedure Laterality Date  . CATARACT EXTRACTION, BILATERAL    . COLONOSCOPY  2012   Dr Olevia Perches  . CYSTOSCOPY     for hematuria  . hydrocoelectomy  2003   Dr Jeffie Pollock  . KNEE ARTHROSCOPY Left 1994  . prostate nodule resection  2003  . TONSILLECTOMY    . TOTAL KNEE ARTHROPLASTY  2009  . TOTAL KNEE ARTHROPLASTY Right 06/27/2016   Procedure: TOTAL KNEE ARTHROPLASTY;  Surgeon: Leandrew Koyanagi, MD;  Location: [facility];   Service: Orthopedics;  Laterality: Right;   Family History  Problem Relation Age of Onset  . Lung cancer Father        smoker  . Alcohol abuse Father   . Tuberculosis Father   . Heart disease Father   . Early death Father   . Diabetes Other   . Heart disease Mother   . Heart attack Brother   . Stroke Neg Hx  Allergies as of 02/04/2020      Reactions   Morphine And Related Other (See Comments)   Severe drop in blood pressure   Citalopram Hydrobromide Other (See Comments)   PATIENT PREFERENCE: "Just ineffective"   Sertraline Hcl Other (See Comments)   Headache      Medication List       Accurate as of February 04, 2020 11:59 PM. If you have any questions, ask your nurse or doctor.        STOP taking these medications   doxycycline 100 MG tablet Commonly known as: VIBRA-TABS Stopped by: Howard Pouch, DO     TAKE these medications   amLODipine 10 MG tablet Commonly known as: NORVASC Take 1 tablet (10 mg total) by mouth daily.   aspirin EC 81 MG tablet Take 81 mg by mouth daily.   atorvastatin 40 MG tablet Commonly known as: LIPITOR Take 1 tablet (40 mg total) by mouth daily.   b complex vitamins tablet Take 1 tablet by mouth every other day.   fexofenadine 180 MG tablet Commonly known as: Allegra Allergy Take 1 tablet (180 mg total) by mouth daily.   fluticasone 50 MCG/ACT nasal spray Commonly known as: FLONASE Place 2 sprays into both nostrils daily.   lisinopril 30 MG tablet Commonly known as: ZESTRIL Take 1 tablet (30 mg total) by mouth daily. Needs office visit prior to any additional refills   MULTIPLE VITAMIN PO Take 1 tablet by mouth daily.   omeprazole 20 MG capsule Commonly known as: PriLOSEC Take 1 capsule (20 mg total) by mouth 2 (two) times daily. Started by: Howard Pouch, DO   sucralfate 1 g tablet Commonly known as: Carafate Take 1 tablet (1 g total) by mouth 4 (four) times daily -  with meals and at bedtime. Started by: Howard Pouch,  DO   Vitamin A 2250 MCG (7500 UT) Caps Take 7,500 Units by mouth daily.   vitamin C 500 MG tablet Commonly known as: ASCORBIC ACID Take 1,000 mg by mouth daily.   Vitamin D-3 25 MCG (1000 UT) Caps Take 1,000 Units by mouth daily with breakfast.       All past medical history, surgical history, allergies, family history, immunizations andmedications were updated in the EMR today and reviewed under the history and medication portions of their EMR.     ROS: Negative, with the exception of above mentioned in HPI   Objective:  BP (!) 137/75   Pulse 74   Temp 98.7 F (37.1 C) (Temporal)   Ht _0  (1.803 m)   Wt 176 lb (79.8 kg)   SpO2 96%   BMI 24.55 kg/m  Body mass index is 24.55 kg/m. Gen: Afebrile. No acute distress.  Appears fatigued.  Very pleasant male. HENT: AT. [place].  Eyes:Pupils Equal Round Reactive to light, Extraocular movements intact,  Conjunctiva without redness, discharge or icterus. Neck/lymp/endocrine: Supple, no lymphadenopathy, no thyromegaly CV: RRR no murmur, no edema, +2/4 P posterior tibialis pulses Chest: CTAB, no wheeze or crackles Abd: Soft.  Flat.  TTP epigastric and right lower quadrant.  ND. BS present.  No masses palpated.  Skin: No rashes, purpura or petechiae.  Neuro:  Normal gait. PERLA. EOMi. Alert. Oriented x3  Psych: Normal affect, dress and demeanor. Normal speech. Normal thought content and judgment..    No exam data present No results found. No results found for this or any previous visit (from the past 24 hour(s)).  Assessment/Plan: Nathan [name] is a 74 y.o.  male present for OV for  Fatigue/dyspnea/hoarseness/right-sided abdominal pain/epigastric pain Anemia, unspecified type/Fecal occult blood test positive -Patient seen last week with CBC with mild anemia.  Iron panel was normal.  Positive fecal occult blood test x2.  Symptoms have remained with new onset sharp abdominal pain epigastric and right lower quadrant. -Omeprazole  twice daily and Carafate prescribed.  If H. pylori positive will treat appropriately. -Avoid NSAIDs -Encouraged MiraLAX use for formed but soft stools as goal - Sedimentation rate - C-reactive protein - H. pylori antibody, IgG - TSH - Ambulatory referral to Gastroenterology-urgently with positive FOBT, new anemia and abdominal pain. -CT abdomen question diverticulitis versus hiatal hernia/peptic ulcer disease.  Urinary frequency - Urinalysis w microscopic + reflex cultur - Ambulatory referral to Urology  Elevated PSA 5.99 PSA referred back to urologist. - Ambulatory referral to Urology    Reviewed expectations re: course of current medical issues.  Discussed self-management of symptoms.  Outlined signs and symptoms indicating need for more acute intervention.  Patient verbalized understanding and all questions were answered.  Patient received an After-Visit Summary.    Orders Placed This Encounter  Procedures  . CT Abdomen Pelvis W Contrast  . Sedimentation rate  . C-reactive protein  . Urinalysis w microscopic + reflex cultur  . H. pylori antibody, IgG  . TSH  . REFLEXIVE URINE CULTURE  . Ambulatory referral to Urology  . Ambulatory referral to Gastroenterology   Meds ordered this encounter  Medications  . omeprazole (PRILOSEC) 20 MG capsule    Sig: Take 1 capsule (20 mg total) by mouth 2 (two) times daily.    Dispense:  60 capsule    Refill:  2  . sucralfate (CARAFATE) 1 g tablet    Sig: Take 1 tablet (1 g total) by mouth 4 (four) times daily -  with meals and at bedtime.    Dispense:  120 tablet    Refill:  1    Referral Orders     Ambulatory referral to Urology     Ambulatory referral to Gastroenterology   Note is dictated utilizing voice recognition software. Although note has been proof read prior to signing, occasional typographical errors still can be missed. If any questions arise, please do not hesitate to call for verification.    electronically signed by:  Howard Pouch, DO  [place]

## 2020-02-04 NOTE — Telephone Encounter (Signed)
Called gave all information. He wanted to let you know he can not get in until 9/29. Wanted to make sure that is ok with you.

## 2020-02-04 NOTE — Patient Instructions (Signed)
Avoid all NSAIDS.  Start omeprazole and Carafate.  Start miralax 1/2 cap daily . If stools become loose taper back to 1/4 cap.  We will call you with lab results. Try to be kind to your gut- bland diet can be helpful.   You will receive a call from urology, gastroenterology and to schedule a CT scan also.    Bland Diet A bland diet consists of foods that are often soft and do not have a lot of fat, fiber, or extra seasonings. Foods without fat, fiber, or seasoning are easier for the body to digest. They are also less likely to irritate your mouth, throat, stomach, and other parts of your digestive system. A bland diet is sometimes called a BRAT diet. What is my plan? Your health care provider or food and nutrition specialist (dietitian) may recommend specific changes to your diet to prevent symptoms or to treat your symptoms. These changes may include:  Eating small meals often.  Cooking food until it is soft enough to chew easily.  Chewing your food well.  Drinking fluids slowly.  Not eating foods that are very spicy, sour, or fatty.  Not eating citrus fruits, such as oranges and grapefruit. What do I need to know about this diet?  Eat a variety of foods from the bland diet food list.  Do not follow a bland diet longer than needed.  Ask your health care provider whether you should take vitamins or supplements. What foods can I eat? Grains  Hot cereals, such as cream of wheat. Rice. Bread, crackers, or tortillas made from refined white flour. Vegetables Canned or cooked vegetables. Mashed or boiled potatoes. Fruits  Bananas. Applesauce. Other types of cooked or canned fruit with the skin and seeds removed, such as canned peaches or pears. Meats and other proteins  Scrambled eggs. Creamy peanut butter or other nut butters. Lean, well-cooked meats, such as chicken or fish. Tofu. Soups or broths. Dairy Low-fat dairy products, such as milk, cottage cheese, or  yogurt. Beverages  Water. Herbal tea. Apple juice. Fats and oils Mild salad dressings. Canola or olive oil. Sweets and desserts Pudding. Custard. Fruit gelatin. Ice cream. The items listed above may not be a complete list of recommended foods and beverages. Contact a dietitian for more options. What foods are not recommended? Grains Whole grain breads and cereals. Vegetables Raw vegetables. Fruits Raw fruits, especially citrus, berries, or dried fruits. Dairy Whole fat dairy foods. Beverages Caffeinated drinks. Alcohol. Seasonings and condiments Strongly flavored seasonings or condiments. Hot sauce. Salsa. Other foods Spicy foods. Fried foods. Sour foods, such as pickled or fermented foods. Foods with high sugar content. Foods high in fiber. The items listed above may not be a complete list of foods and beverages to avoid. Contact a dietitian for more information. Summary  A bland diet consists of foods that are often soft and do not have a lot of fat, fiber, or extra seasonings.  Foods without fat, fiber, or seasoning are easier for the body to digest.  Check with your health care provider to see how long you should follow this diet plan. It is not meant to be followed for long periods. This information is not intended to replace advice given to you by your health care provider. Make sure you discuss any questions you have with your health care provider. Document Revised: 07/30/2017 Document Reviewed: 07/30/2017 Elsevier Patient Education  [employer].   Peptic Ulcer  A peptic ulcer is a painful sore in  the lining of your stomach or the first part of your small intestine. What are the causes? Common causes of this condition include:  An infection.  Using certain pain medicines too often or too much. What increases the risk? You are more likely to get this condition if you:  Smoke.  Have a family history of ulcer disease.  Drink alcohol.  Have been  hospitalized in an intensive care unit (ICU). What are the signs or symptoms? Symptoms include:  Burning pain in the area between the chest and the belly button. The pain may: ? Not go away (be persistent). ? Be worse when your stomach is empty. ? Be worse at night.  Heartburn.  Feeling sick to your stomach (nauseous) and throwing up (vomiting).  Bloating. If the ulcer results in bleeding, it can cause you to:  Have poop (stool) that is black and looks like tar.  Throw up bright red blood.  Throw up material that looks like coffee grounds. How is this treated? Treatment for this condition may include:  Stopping things that can cause the ulcer, such as: ? Smoking. ? Using pain medicines.  Medicines to reduce stomach acid.  Antibiotic medicines if the ulcer is caused by an infection.  A procedure that is done using a small, flexible tube that has a camera at the end (upper endoscopy). This may be done if you have a bleeding ulcer.  Surgery. This may be needed if: ? You have a lot of bleeding. ? The ulcer caused a hole somewhere in the digestive system. Follow these instructions at home:  Do not drink alcohol if your doctor tells you not to drink.  Limit how much caffeine you take in.  Do not use any products that contain nicotine or tobacco, such as cigarettes, e-cigarettes, and chewing tobacco. If you need help quitting, ask your doctor.  Take over-the-counter and prescription medicines only as told by your doctor. ? Do not stop or change your medicines unless you talk with your doctor about it first. ? Do not take aspirin, ibuprofen, or other NSAIDs unless your doctor told you to do so.  Keep all follow-up visits as told by your doctor. This is important. Contact a doctor if:  You do not get better in 7 days after you start treatment.  You keep having an upset stomach (indigestion) or heartburn. Get help right away if:  You have sudden, sharp pain in your  belly (abdomen).  You have belly pain that does not go away.  You have bloody poop (stool) or black, tarry poop.  You throw up blood. It may look like coffee grounds.  You feel light-headed or feel like you may pass out (faint).  You get weak.  You get sweaty or feel sticky and cold to the touch (clammy). Summary  Symptoms of a peptic ulcer include burning pain in the area between the chest and the belly button.  Take medicines only as told by your doctor.  Limit how much alcohol and caffeine you have.  Keep all follow-up visits as told by your doctor. This information is not intended to replace advice given to you by your health care provider. Make sure you discuss any questions you have with your health care provider. Document Revised: 01/06/2018 Document Reviewed: 01/06/2018 Elsevier Patient Education  [facility].

## 2020-02-05 LAB — URINALYSIS W MICROSCOPIC + REFLEX CULTURE

## 2020-02-05 LAB — NO CULTURE INDICATED

## 2020-02-07 ENCOUNTER — Ambulatory Visit (HOSPITAL_BASED_OUTPATIENT_CLINIC_OR_DEPARTMENT_OTHER)
Admission: RE | Admit: 2020-02-07 | Discharge: 2020-02-07 | Disposition: A | Discharge status: Home / Self Care | Payer: Medicare Other | Source: Ambulatory Visit | Attending: Family Medicine | Admitting: Family Medicine

## 2020-02-07 ENCOUNTER — Other Ambulatory Visit: Payer: Self-pay

## 2020-02-07 ENCOUNTER — Ambulatory Visit: Payer: Medicare Other | Admitting: Family Medicine

## 2020-02-07 ENCOUNTER — Telehealth: Payer: Self-pay

## 2020-02-07 DIAGNOSIS — D649 Anemia, unspecified: Secondary | ICD-10-CM | POA: Insufficient documentation

## 2020-02-07 DIAGNOSIS — R1011 Right upper quadrant pain: Secondary | ICD-10-CM | POA: Insufficient documentation

## 2020-02-07 DIAGNOSIS — K5730 Diverticulosis of large intestine without perforation or abscess without bleeding: Secondary | ICD-10-CM | POA: Diagnosis not present

## 2020-02-07 DIAGNOSIS — R195 Other fecal abnormalities: Secondary | ICD-10-CM

## 2020-02-07 DIAGNOSIS — R350 Frequency of micturition: Secondary | ICD-10-CM | POA: Insufficient documentation

## 2020-02-07 DIAGNOSIS — R490 Dysphonia: Secondary | ICD-10-CM | POA: Insufficient documentation

## 2020-02-07 DIAGNOSIS — R5383 Other fatigue: Secondary | ICD-10-CM | POA: Insufficient documentation

## 2020-02-07 DIAGNOSIS — R1013 Epigastric pain: Secondary | ICD-10-CM | POA: Insufficient documentation

## 2020-02-07 HISTORY — DX: Other fecal abnormalities: R19.5

## 2020-02-07 MED ORDER — IOHEXOL 300 MG/ML  SOLN
100.0000 mL | Freq: Once | INTRAMUSCULAR | Status: AC | PRN
  Administered 2020-02-07: 100 mL via INTRAVENOUS

## 2020-02-07 NOTE — Telephone Encounter (Signed)
Pt was called and detailed VM was left with information/instructions

## 2020-02-07 NOTE — Telephone Encounter (Signed)
Please advise 

## 2020-02-07 NOTE — Telephone Encounter (Signed)
Issue addressed and documented in second phone note

## 2020-02-07 NOTE — Telephone Encounter (Signed)
I would advise he take the appointment with the physicians assistant.  This will get him in quicker for this issue.  I also ordered the CT of his abdomen to be completed.  They should be calling him to get that scheduled as well.  Once we receive those results we will give him a call.

## 2020-02-07 NOTE — Telephone Encounter (Signed)
Patient got an GI appointment with Dr. Havery Moros 04/12/20 which was his first available. There was an earlier appointment with a PA. Patient would like to know if Dr. Raoul Pitch thinks he should keep his appointment with the doctor on 04/12/20 or should he take an earlier appointment with a PA?

## 2020-02-08 ENCOUNTER — Telehealth: Payer: Self-pay | Admitting: Family Medicine

## 2020-02-08 DIAGNOSIS — E0590 Thyrotoxicosis, unspecified without thyrotoxic crisis or storm: Secondary | ICD-10-CM

## 2020-02-08 NOTE — Telephone Encounter (Signed)
Pt returned call and was given all lab results/instructions. He was scheduled for a lab appt tomorrow.

## 2020-02-08 NOTE — Telephone Encounter (Signed)
Pt was called and VM was left to return call.    Diane/Dana- Can we see if we can move his appt up? See Dr Dierdre Highman note.

## 2020-02-08 NOTE — Telephone Encounter (Signed)
Please call patient: Overall the CT scan appears reassuring. He has a large stool burden present-he should  take the MiraLAX, as we discussed at his visit, to decrease stool burden.   He can also consider starting Senokot (OTC) until bowel movements are more regular. He has some diverticulosis, which was known by his history at the lower portion of his colon.  This does not appear inflamed or infected. His prostate remains enlarged.  I did place the referral to urology at his appointment last week-he should be hearing from them to schedule follow-up.  Therefore he has recently been referred to Gastroenterology> for stomach pain, new anemia and positive FOBT. Urology> for elevated PSA and prostate enlargement Endocrinology> for his newly diagnosed hyperthyroidism.  I noticed his endocrinology appointment is another 4 weeks away.  Please see if they can get him in sooner since he is symptomatic new hyperthyroidism patient.  In the meantime I have placed additional labs for him to have completed here as soon as possible so we can  start further work-up on his thyroid while we wait on appointment with specialty.

## 2020-02-09 ENCOUNTER — Ambulatory Visit (INDEPENDENT_AMBULATORY_CARE_PROVIDER_SITE_OTHER): Payer: Medicare Other | Admitting: Family Medicine

## 2020-02-09 ENCOUNTER — Other Ambulatory Visit: Payer: Self-pay

## 2020-02-09 DIAGNOSIS — E0590 Thyrotoxicosis, unspecified without thyrotoxic crisis or storm: Secondary | ICD-10-CM

## 2020-02-09 LAB — TSH: TSH: <0.01 u[IU]/mL — ABNORMAL LOW (ref 0.35–4.50)

## 2020-02-09 LAB — T3, FREE: T3, Free: 4.8 pg/mL — ABNORMAL HIGH (ref 2.3–4.2)

## 2020-02-09 LAB — T4, FREE: Free T4: 2.96 ng/dL — ABNORMAL HIGH (ref 0.60–1.60)

## 2020-02-10 ENCOUNTER — Telehealth: Payer: Self-pay | Admitting: Family Medicine

## 2020-02-10 MED ORDER — METHIMAZOLE 5 MG PO TABS
5.0000 mg | ORAL_TABLET | Freq: Every day | ORAL | 1 refills | Status: DC
Start: 2020-02-10 — End: 2020-03-08

## 2020-02-10 NOTE — Telephone Encounter (Signed)
Contacted patient today to discuss his additional thyroid panel. TSH low >0.01 (repeated and verified) T3 free 4.8 High T4 free 2.96 high Thyroid peroxidase antibodies high at 221 Thyroid-stimulating antibodies pending  Patient's endocrine appointment is 4 weeks away and patient is symptomatic.  Therefore, we discussed today him starting  Tapazole 5 mg daily and he is agreeable to this today.  Medication prescribed.  Endocrine will take over management once he establishes. Electronically Signed by: Howard Pouch, DO [facility]

## 2020-02-14 LAB — THYROID STIMULATING IMMUNOGLOBULIN: TSI: <89 % baseline (ref <140)

## 2020-02-14 LAB — THYROID PEROXIDASE ANTIBODY: Thyroperoxidase Ab SerPl-aCnc: 221 IU/mL — ABNORMAL HIGH (ref <9)

## 2020-02-18 ENCOUNTER — Other Ambulatory Visit: Payer: Self-pay | Admitting: Family Medicine

## 2020-02-18 NOTE — Telephone Encounter (Signed)
Rx sent with 5 refills.

## 2020-02-19 ENCOUNTER — Other Ambulatory Visit: Payer: Self-pay | Admitting: Family Medicine

## 2020-03-03 ENCOUNTER — Other Ambulatory Visit: Payer: Self-pay | Admitting: Family Medicine

## 2020-03-06 ENCOUNTER — Ambulatory Visit: Payer: Medicare Other | Admitting: Endocrinology

## 2020-03-06 ENCOUNTER — Encounter: Payer: Self-pay | Admitting: Endocrinology

## 2020-03-06 ENCOUNTER — Other Ambulatory Visit: Payer: Self-pay

## 2020-03-06 VITALS — BP 152/80 | HR 54 | Ht 71.0 in | Wt 180.0 lb

## 2020-03-06 DIAGNOSIS — E0590 Thyrotoxicosis, unspecified without thyrotoxic crisis or storm: Secondary | ICD-10-CM

## 2020-03-06 LAB — TSH: TSH: 8.45 u[IU]/mL — ABNORMAL HIGH (ref 0.35–4.50)

## 2020-03-06 LAB — T4, FREE: Free T4: 0.54 ng/dL — ABNORMAL LOW (ref 0.60–1.60)

## 2020-03-06 NOTE — Patient Instructions (Addendum)
Your blood pressure is high today.  Please see your primary care provider soon, to have it rechecked Blood tests are requested for you today.  We'll let you know about the results.  If ever you have fever while taking methimazole, stop it and call us, even if the reason is obvious, because of the risk of a rare side-effect.   Please come back for a follow-up appointment in 1 month.    In the future, we can consider another treatment option we have discussed today      Hyperthyroidism  Hyperthyroidism is when the thyroid gland is too active (overactive). The thyroid gland is a small gland located in the lower front part of the neck, just in front of the windpipe (trachea). This gland makes hormones that help control how the body uses food for energy (metabolism) as well as how the heart and brain function. These hormones also play a role in keeping your bones strong. When the thyroid is overactive, it produces too much of a hormone called thyroxine. What are the causes? This condition may be caused by:  Graves' disease. This is a disorder in which the body's disease-fighting system (immune system) attacks the thyroid gland. This is the most common cause.  Inflammation of the thyroid gland.  A tumor in the thyroid gland.  Use of certain medicines, including: ? Prescription thyroid hormone replacement. ? Herbal supplements that mimic thyroid hormones. ? Amiodarone therapy.  Solid or fluid-filled lumps within your thyroid gland (thyroid nodules).  Taking in a large amount of iodine from foods or medicines. What increases the risk? You are more likely to develop this condition if:  You are male.  You have a family history of thyroid conditions.  You smoke tobacco.  You use a medicine called lithium.  You take medicines that affect the immune system (immunosuppressants). What are the signs or symptoms? Symptoms of this condition include:  Nervousness.  Inability to tolerate  heat.  Unexplained weight loss.  Diarrhea.  Change in the texture of hair or skin.  Heart skipping beats or making extra beats.  Rapid heart rate.  Loss of menstruation.  Shaky hands.  Fatigue.  Restlessness.  Sleep problems.  Enlarged thyroid gland or a lump in the thyroid (nodule). You may also have symptoms of Graves' disease, which may include:  Protruding eyes.  Dry eyes.  Red or swollen eyes.  Problems with vision. How is this diagnosed? This condition may be diagnosed based on:  Your symptoms and medical history.  A physical exam.  Blood tests.  Thyroid ultrasound. This test involves using sound waves to produce images of the thyroid gland.  A thyroid scan. A radioactive substance is injected into a vein, and images show how much iodine is present in the thyroid.  Radioactive iodine uptake test (RAIU). A small amount of radioactive iodine is given by mouth to see how much iodine the thyroid absorbs after a certain amount of time. How is this treated? Treatment depends on the cause and severity of the condition. Treatment may include:  Medicines to reduce the amount of thyroid hormone your body makes.  Radioactive iodine treatment (radioiodine therapy). This involves swallowing a small dose of radioactive iodine, in capsule or liquid form, to kill thyroid cells.  Surgery to remove part or all of your thyroid gland. You may need to take thyroid hormone replacement medicine for the rest of your life after thyroid surgery.  Medicines to help manage your symptoms. Follow these instructions at  home:   Take over-the-counter and prescription medicines only as told by your health care provider.  Do not use any products that contain nicotine or tobacco, such as cigarettes and e-cigarettes. If you need help quitting, ask your health care provider.  Follow any instructions from your health care provider about diet. You may be instructed to limit foods that  contain iodine.  Keep all follow-up visits as told by your health care provider. This is important. ? You will need to have blood tests regularly so that your health care provider can monitor your condition. Contact a health care provider if:  Your symptoms do not get better with treatment.  You have a fever.  You are taking thyroid hormone replacement medicine and you: ? Have symptoms of depression. ? Feel like you are tired all the time. ? Gain weight. Get help right away if:  You have chest pain.  You have decreased alertness or a change in your awareness.  You have abdominal pain.  You feel dizzy.  You have a rapid heartbeat.  You have an irregular heartbeat.  You have difficulty breathing. Summary  The thyroid gland is a small gland located in the lower front part of the neck, just in front of the windpipe (trachea).  Hyperthyroidism is when the thyroid gland is too active (overactive) and produces too much of a hormone called thyroxine.  The most common cause is Graves' disease, a disorder in which your immune system attacks the thyroid gland.  Hyperthyroidism can cause various symptoms, such as unexplained weight loss, nervousness, inability to tolerate heat, or changes in your heartbeat.  Treatment may include medicine to reduce the amount of thyroid hormone your body makes, radioiodine therapy, surgery, or medicines to manage symptoms. This information is not intended to replace advice given to you by your health care provider. Make sure you discuss any questions you have with your health care provider. Document Revised: 06/13/2017 Document Reviewed: 06/11/2017 Elsevier Patient Education  2020 [employer].

## 2020-03-06 NOTE — Progress Notes (Signed)
Subjective:    Patient ID: Nathan [name], male    DOB: Nov 14, 1945, 74 y.o.   MRN: [id number]  HPI Pt is referred by Dr Raoul Pitch, for hyperthyroidism.  Pt reports he was dx'ed with hyperthyroidism in 2021 (TFT were normal in 2020).  He was rx'ed tapazole.  He has never had XRT to the anterior neck, or thyroid surgery.  He has never had thyroid imaging.  He does not consume kelp or any other non-prescribed thyroid medication.  He has never been on amiodarone.  Main symptoms are fatigue, weight loss, tremor, anxiety, and palpitations.   Past Medical History:  Diagnosis Date  . Adrenal mass ([place]) 2018   Urology fully evaluated; benign  . Allergy    hay fever  . Anxiety   . Cataract   . Cervicalgia   . Complication of anesthesia    hypoglycemia, low blood pressure, pt. reports it dropped to zero- post op MORPHINE , then taken ICU due to either the morphine or hypoglycemia . In addition post op from cataract surgery- 07/2015, experienced an episode of hypoglycemia    . Depressive disorder, not elsewhere classified   . Diverticulosis   . Elevated prostate specific antigen (PSA) 2008   Dr Jeffie Pollock  . Gallstone   . H/O cardiovascular stress test >10 yrs.   told that its wnl. Told to drink less coffee  . Hematuria 2018   Hematuria with enlarged prostate, kidney cyst, adrenal adenoma--> followed by urology with cystoscopy and repeat CT, benign workup.  [name] Hx of migraines   . Hypertension   . Hypoglycemia   . IBS (irritable bowel syndrome)   . Inguinal hernia 12/2015   Bilateral small indirect hernias on CT, umbilical hernia also present.  . Kidney cyst, acquired 2018   Followed by urology, benign.  . Low back pain   . Macular degeneration (senile) of retina, unspecified    2 different opinions from 2 different Ophth  . Osteoarthrosis, unspecified whether generalized or localized, unspecified site   . Other and unspecified hyperlipidemia   . Prostatitis 2008  . Refusal of blood transfusions as  patient is Jehovah's Witness   . Segmental and somatic dysfunction of cervical region   . Segmental and somatic dysfunction of lumbar region   . Segmental and somatic dysfunction of sacral region   . Segmental and somatic dysfunction of thoracic region   . Spondylosis of lumbar spine   . Strain of pelvis   . 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    Past Surgical History:  Procedure Laterality Date  . CATARACT EXTRACTION, BILATERAL    . COLONOSCOPY  2012   Dr Olevia Perches  . CYSTOSCOPY     for hematuria  . hydrocoelectomy  2003   Dr Jeffie Pollock  . KNEE ARTHROSCOPY Left 1994  . prostate nodule resection  2003  . TONSILLECTOMY    . TOTAL KNEE ARTHROPLASTY  2009  . TOTAL KNEE ARTHROPLASTY Right 06/27/2016   Procedure: TOTAL KNEE ARTHROPLASTY;  Surgeon: Leandrew Koyanagi, MD;  Location: [facility];  Service: Orthopedics;  Laterality: Right;    Social History   Socioeconomic History  . Marital status: Married    Spouse name: Not on file  . Number of children: Not on file  . Years of education: Not on file  . Highest education level: Not on file  Occupational History  . Not on file  Tobacco Use  . Smoking status: Former Smoker  Quit date: 07/15/1968    Years since quitting: 51.6  . Smokeless tobacco: Never Used  . Tobacco comment: smoked [place], up to 1ppd  Vaping Use  . Vaping Use: Never used  Substance and Sexual Activity  . Alcohol use: Not Currently  . Drug use: No  . Sexual activity: Yes    Partners: Female    Birth control/protection: None  Other Topics Concern  . Not on file  Social History Narrative   Married. Wife's name is [name]. Full time employed, Pensions consultant.   Drinks caffeinated beverages. Take a multivitamin. Wears his seatbelt.   Exercises at least 3 times a week. Is not a strict vegetarian, but only eats meat 1-2 times a week   Wears a hearing aid. Does not wear dentures or use an assistive walking device.   There is a  smoke detector in his home.   He feels safe in his relationships.   Social Determinants of Health   Financial Resource Strain:   . Difficulty of Paying Living Expenses: Not on file  Food Insecurity:   . Worried About Charity fundraiser in the Last Year: Not on file  . Ran Out of Food in the Last Year: Not on file  Transportation Needs:   . Lack of Transportation (Medical): Not on file  . Lack of Transportation (Non-Medical): Not on file  Physical Activity:   . Days of Exercise per Week: Not on file  . Minutes of Exercise per Session: Not on file  Stress:   . Feeling of Stress : Not on file  Social Connections:   . Frequency of Communication with Friends and Family: Not on file  . Frequency of Social Gatherings with Friends and Family: Not on file  . Attends Religious Services: Not on file  . Active Member of Clubs or Organizations: Not on file  . Attends Archivist Meetings: Not on file  . Marital Status: Not on file  Intimate Partner Violence:   . Fear of Current or Ex-Partner: Not on file  . Emotionally Abused: Not on file  . Physically Abused: Not on file  . Sexually Abused: Not on file    Current Outpatient Medications on File Prior to Visit  Medication Sig Dispense Refill  . amLODipine (NORVASC) 10 MG tablet Take 1 tablet (10 mg total) by mouth daily. 90 tablet 1  . b complex vitamins tablet Take 1 tablet by mouth every other day.    . Cholecalciferol (VITAMIN D-3) 25 MCG (1000 UT) CAPS Take 1,000 Units by mouth daily with breakfast.    . fexofenadine (ALLEGRA) 180 MG tablet TAKE 1 TABLET BY MOUTH EVERY DAY 30 tablet 5  . fluticasone (FLONASE) 50 MCG/ACT nasal spray SPRAY 2 SPRAYS INTO EACH NOSTRIL EVERY DAY 16 mL 5  . lisinopril (ZESTRIL) 30 MG tablet Take 1 tablet (30 mg total) by mouth daily. Needs office visit prior to any additional refills 90 tablet 1  . methimazole (TAPAZOLE) 5 MG tablet Take 1 tablet (5 mg total) by mouth daily. 30 tablet 1  . MULTIPLE  VITAMIN PO Take 1 tablet by mouth daily.    [name] omeprazole (PRILOSEC) 20 MG capsule Take 1 capsule (20 mg total) by mouth 2 (two) times daily. 60 capsule 2  . sucralfate (CARAFATE) 1 g tablet Take 1 tablet (1 g total) by mouth 4 (four) times daily -  with meals and at bedtime. 120 tablet 1  . Vitamin A 2250 MCG (7500  UT) CAPS Take 7,500 Units by mouth daily.     . vitamin C (ASCORBIC ACID) 500 MG tablet Take 1,000 mg by mouth daily.      No current facility-administered medications on file prior to visit.    Allergies  Allergen Reactions  . Morphine And Related Other (See Comments)    Severe drop in blood pressure  . Citalopram Hydrobromide Other (See Comments)    PATIENT PREFERENCE: "Just ineffective"  . Sertraline Hcl Other (See Comments)    Headache    Family History  Problem Relation Age of Onset  . Lung cancer Father        smoker  . Alcohol abuse Father   . Tuberculosis Father   . Heart disease Father   . Early death Father   . Diabetes Other   . Heart disease Mother   . Heart attack Brother   . Stroke Neg Hx   . Thyroid disease Neg Hx     BP (!) 152/80   Pulse (!) 54   Ht 5\' 11"  (1.803 m)   Wt 180 lb (81.6 kg)   SpO2 98%   BMI 25.10 kg/m     Review of Systems denies sob, muscle weakness, excessive diaphoresis, and heat intolerance.      Objective:   Physical Exam VS: see vs page GEN: no distress HEAD: head: no deformity.  bilat hearing aids eyes: no periorbital swelling, no proptosis external nose and ears are normal NECK: supple, thyroid is not enlarged CHEST WALL: no deformity LUNGS: clear to auscultation CV: reg rate and rhythm, no murmur.  MUSCULOSKELETAL: muscle bulk and strength are grossly normal.  no obvious joint swelling.  gait is normal and steady.   EXTEMITIES: no deformity.  Trace bilat leg edema PULSES: no carotid bruit NEURO:  cn 2-12 grossly intact, except for hearing loss   readily moves all 4's.  sensation is intact to touch on all  4's.  Slight tremor of the hands.   SKIN:  Normal texture and temperature.  No rash or suspicious lesion is visible.  Not diaphoretic.   NODES:  None palpable at the neck.   PSYCH: alert, well-oriented.  Does not appear anxious nor depressed.     Lab Results  Component Value Date   TSH 8.45 (H) 03/06/2020   I have reviewed outside records, and summarized: Pt was noted to have suppressed, and referred here.  Main symptom was fatigue.  multiple other causes were considered.      Assessment & Plan:  HTN: is noted today Hypothyroidism, new, due to tapazole.  She may have subacute thyroiditis.  Patient Instructions  Your blood pressure is high today.  Please see your primary care provider soon, to have it rechecked Blood tests are requested for you today.  We'll let you know about the results.  If ever you have fever while taking methimazole, stop it and call us, even if the reason is obvious, because of the risk of a rare side-effect.   Please come back for a follow-up appointment in 1 month.    In the future, we can consider another treatment option we have discussed today      Hyperthyroidism  Hyperthyroidism is when the thyroid gland is too active (overactive). The thyroid gland is a small gland located in the lower front part of the neck, just in front of the windpipe (trachea). This gland makes hormones that help control how the body uses food for energy (metabolism) as well as how  the heart and brain function. These hormones also play a role in keeping your bones strong. When the thyroid is overactive, it produces too much of a hormone called thyroxine. What are the causes? This condition may be caused by:  Graves' disease. This is a disorder in which the body's disease-fighting system (immune system) attacks the thyroid gland. This is the most common cause.  Inflammation of the thyroid gland.  A tumor in the thyroid gland.  Use of certain medicines,  including: ? Prescription thyroid hormone replacement. ? Herbal supplements that mimic thyroid hormones. ? Amiodarone therapy.  Solid or fluid-filled lumps within your thyroid gland (thyroid nodules).  Taking in a large amount of iodine from foods or medicines. What increases the risk? You are more likely to develop this condition if:  You are male.  You have a family history of thyroid conditions.  You smoke tobacco.  You use a medicine called lithium.  You take medicines that affect the immune system (immunosuppressants). What are the signs or symptoms? Symptoms of this condition include:  Nervousness.  Inability to tolerate heat.  Unexplained weight loss.  Diarrhea.  Change in the texture of hair or skin.  Heart skipping beats or making extra beats.  Rapid heart rate.  Loss of menstruation.  Shaky hands.  Fatigue.  Restlessness.  Sleep problems.  Enlarged thyroid gland or a lump in the thyroid (nodule). You may also have symptoms of Graves' disease, which may include:  Protruding eyes.  Dry eyes.  Red or swollen eyes.  Problems with vision. How is this diagnosed? This condition may be diagnosed based on:  Your symptoms and medical history.  A physical exam.  Blood tests.  Thyroid ultrasound. This test involves using sound waves to produce images of the thyroid gland.  A thyroid scan. A radioactive substance is injected into a vein, and images show how much iodine is present in the thyroid.  Radioactive iodine uptake test (RAIU). A small amount of radioactive iodine is given by mouth to see how much iodine the thyroid absorbs after a certain amount of time. How is this treated? Treatment depends on the cause and severity of the condition. Treatment may include:  Medicines to reduce the amount of thyroid hormone your body makes.  Radioactive iodine treatment (radioiodine therapy). This involves swallowing a small dose of radioactive  iodine, in capsule or liquid form, to kill thyroid cells.  Surgery to remove part or all of your thyroid gland. You may need to take thyroid hormone replacement medicine for the rest of your life after thyroid surgery.  Medicines to help manage your symptoms. Follow these instructions at home:   Take over-the-counter and prescription medicines only as told by your health care provider.  Do not use any products that contain nicotine or tobacco, such as cigarettes and e-cigarettes. If you need help quitting, ask your health care provider.  Follow any instructions from your health care provider about diet. You may be instructed to limit foods that contain iodine.  Keep all follow-up visits as told by your health care provider. This is important. ? You will need to have blood tests regularly so that your health care provider can monitor your condition. Contact a health care provider if:  Your symptoms do not get better with treatment.  You have a fever.  You are taking thyroid hormone replacement medicine and you: ? Have symptoms of depression. ? Feel like you are tired all the time. ? Gain weight. Get help right  away if:  You have chest pain.  You have decreased alertness or a change in your awareness.  You have abdominal pain.  You feel dizzy.  You have a rapid heartbeat.  You have an irregular heartbeat.  You have difficulty breathing. Summary  The thyroid gland is a small gland located in the lower front part of the neck, just in front of the windpipe (trachea).  Hyperthyroidism is when the thyroid gland is too active (overactive) and produces too much of a hormone called thyroxine.  The most common cause is Graves' disease, a disorder in which your immune system attacks the thyroid gland.  Hyperthyroidism can cause various symptoms, such as unexplained weight loss, nervousness, inability to tolerate heat, or changes in your heartbeat.  Treatment may include  medicine to reduce the amount of thyroid hormone your body makes, radioiodine therapy, surgery, or medicines to manage symptoms. This information is not intended to replace advice given to you by your health care provider. Make sure you discuss any questions you have with your health care provider. Document Revised: 06/13/2017 Document Reviewed: 06/11/2017 Elsevier Patient Education  2020 [employer].

## 2020-03-08 DIAGNOSIS — L821 Other seborrheic keratosis: Secondary | ICD-10-CM | POA: Diagnosis not present

## 2020-03-08 DIAGNOSIS — L578 Other skin changes due to chronic exposure to nonionizing radiation: Secondary | ICD-10-CM | POA: Diagnosis not present

## 2020-03-08 DIAGNOSIS — D225 Melanocytic nevi of trunk: Secondary | ICD-10-CM | POA: Diagnosis not present

## 2020-03-08 DIAGNOSIS — L308 Other specified dermatitis: Secondary | ICD-10-CM | POA: Diagnosis not present

## 2020-03-08 DIAGNOSIS — L720 Epidermal cyst: Secondary | ICD-10-CM | POA: Diagnosis not present

## 2020-03-16 ENCOUNTER — Ambulatory Visit: Payer: Medicare Other | Admitting: Nurse Practitioner

## 2020-03-16 ENCOUNTER — Other Ambulatory Visit (INDEPENDENT_AMBULATORY_CARE_PROVIDER_SITE_OTHER): Payer: Medicare Other

## 2020-03-16 ENCOUNTER — Encounter: Payer: Self-pay | Admitting: Nurse Practitioner

## 2020-03-16 VITALS — BP 160/80 | HR 52 | Ht 70.5 in | Wt 178.0 lb

## 2020-03-16 DIAGNOSIS — D649 Anemia, unspecified: Secondary | ICD-10-CM

## 2020-03-16 DIAGNOSIS — R195 Other fecal abnormalities: Secondary | ICD-10-CM

## 2020-03-16 DIAGNOSIS — K644 Residual hemorrhoidal skin tags: Secondary | ICD-10-CM | POA: Diagnosis not present

## 2020-03-16 LAB — CBC
HCT: 39.9 % (ref 39.0–52.0)
Hemoglobin: 13.4 g/dL (ref 13.0–17.0)
MCHC: 33.5 g/dL (ref 30.0–36.0)
MCV: 86.7 fl (ref 78.0–100.0)
Platelets: 281 10*3/uL (ref 150.0–400.0)
RBC: 4.6 Mil/uL (ref 4.22–5.81)
RDW: 16 % — ABNORMAL HIGH (ref 11.5–15.5)
WBC: 8.6 10*3/uL (ref 4.0–10.5)

## 2020-03-16 NOTE — Patient Instructions (Addendum)
Your provider has requested that you go to the basement level for lab work before leaving today. Press "B" on the elevator. The lab is located at the first door on the left as you exit the elevator.  Continue omeprazole and carafate.   You can use preparation H cream as needed.   You have been scheduled for an endoscopy. Please follow written instructions given to you at your visit today. If you use inhalers (even only as needed), please bring them with you on the day of your procedure.   Due to recent changes in healthcare laws, you may see the results of your imaging and laboratory studies on MyChart before your provider has had a chance to review them.  We understand that in some cases there may be results that are confusing or concerning to you. Not all laboratory results come back in the same time frame and the provider may be waiting for multiple results in order to interpret others.  Please give [place] 48 hours in order for your provider to thoroughly review all the results before contacting the office for clarification of your results.

## 2020-03-16 NOTE — Progress Notes (Signed)
ASSESSMENT AND PLAN                                                                                                                                                                                                                                # New ,mild [place] anemia, heme + stool in setting of NSAIDS --Hgb 12.4 in late July, down from 13.9 baseline. Not iron deficient by labs --Repeat CBC today  --Continue Omeprazole and Carafate --Continue to hold NSAIDS --For further evaluation patient will be scheduled for EGD. The risks and benefits of EGD were discussed and the patient agrees to proceed. Patient politely inquired about risks of not performing EGD ( just monitoring blood counts and repeat hemoccult cards). I explained that it is certainly his decision as to whether to proceed but without investigation there is risk of misdiagnosis / delayed treatment.  He is agreeable to proceeding with EGD  # Constipation --Resolved on stool softeners.   # Colon cancer screening --He is on recall list for follow-up colonoscopy 2022  # External hemorrhoid in setting of constipation --rectal exam not done today.  --patient hasn't tried any hemorrhoidal treatment --recommend trial of Prep H PR and to external hemorrhoid  --continue stool softeners           HISTORY OF PRESENT ILLNESS    Chief Complaint:  anemia  TERELL KINCY is a 74 y.o. male with PMH / [place] significant for,  but not necessarily limited to: IBS, osteoarthritis, prostatitis / BPH,  hypertension, anxiety, diverticulosis, migraines, tuberculosis, cholelithiasis, hyperthyroidism  Patient last seen in 2019 by Dr. Havery Moros for RUQ pain.  He is referred by PCP for evaluation of anemia, FOBT + stool.   Patient saw PCP late July with fatigue. He had epigastric tenderness on exam, was noted to have new anemia and hemoccult positive stools.   Baseline hemoglobin mid 13 - mid 14 range,  it had declined to 12.4.  MCV 87.  Normal iron  studies. H.pylori IgG negative.  He wasn't,  and still isn't having actual epigastric pain, just tender when PCP examined him.  CT scan obtained, showed large stool burden and diverticulosis.    PCP started him on Omeprazole, and Carafate. He was on chronic NSAIDs several times a week and those were stopped. While performing hemoccult cards patient recalls stools were hard and dark but that was the first time he noticed  them being dark.  No dark stools since hemoccults done and constipation resolved after starting stool softeners. Patient says he has an external hemorrhoid and inquires about treatment.    Component     Latest Ref Rng & Units 11/30/2019 01/28/2020  WBC     4.0 - 10.5 K/uL 8.1 7.5  RBC     4.22 - 5.81 Mil/uL 4.58 4.20 (L)  Hemoglobin     13.0 - 17.0 g/dL 13.9 12.4 (L)  HCT     39 - 52 % 41.8 36.5 (L)  MCV     78.0 - 100.0 fl 91.3 87.0  MCH     26.0 - 34.0 pg 30.3   MCHC     30.0 - 36.0 g/dL 33.3 33.8  RDW     11.5 - 15.5 % 14.7 13.5  Platelets     150 - 400 K/uL 286 334.0     Ref Range & Units 1 mo ago  Iron 50 - 180 mcg/dL 66   TIBC 250 - 425 mcg/dL (calc) 303   %SAT 20 - 48 % (calc) 22   Ferritin 24 - 380 ng/mL 116         PREVIOUS ENDOSCOPIC EVALUATIONS / GI STUDIES:    Past Medical History:  Diagnosis Date  . Adrenal mass ([place]) 2018   Urology fully evaluated; benign  . Allergy    hay fever  . Anxiety   . Cataract   . Cervicalgia   . Complication of anesthesia    hypoglycemia, low blood pressure, pt. reports it dropped to zero- post op MORPHINE , then taken ICU due to either the morphine or hypoglycemia . In addition post op from cataract surgery- 07/2015, experienced an episode of hypoglycemia    . Depressive disorder, not elsewhere classified   . Diverticulosis   . Elevated prostate specific antigen (PSA) 2008   Dr Jeffie Pollock  . Gallstone   . H/O cardiovascular stress test >10 yrs.   told that its wnl. Told to drink less coffee  . Hematuria 2018    Hematuria with enlarged prostate, kidney cyst, adrenal adenoma--> followed by urology with cystoscopy and repeat CT, benign workup.  [name] Hx of migraines   . Hypertension   . Hypoglycemia   . IBS (irritable bowel syndrome)   . Inguinal hernia 12/2015   Bilateral small indirect hernias on CT, umbilical hernia also present.  . Kidney cyst, acquired 2018   Followed by urology, benign.  . Low back pain   . Macular degeneration (senile) of retina, unspecified    2 different opinions from 2 different Ophth  . Osteoarthrosis, unspecified whether generalized or localized, unspecified site   . Other and unspecified hyperlipidemia   . Prostatitis 2008  . Refusal of blood transfusions as patient is Jehovah's Witness   . Segmental and somatic dysfunction of cervical region   . Segmental and somatic dysfunction of lumbar region   . Segmental and somatic dysfunction of sacral region   . Segmental and somatic dysfunction of thoracic region   . Spondylosis of lumbar spine   . Strain of pelvis   . 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     Past Surgical History:  Procedure Laterality Date  . CATARACT EXTRACTION, BILATERAL    . COLONOSCOPY  2012   Dr Olevia Perches  . CYSTOSCOPY     for hematuria  . hydrocoelectomy  2003   Dr Jeffie Pollock  . KNEE ARTHROSCOPY Left 1994  .  prostate nodule resection  2003  . TONSILLECTOMY    . TOTAL KNEE ARTHROPLASTY  2009  . TOTAL KNEE ARTHROPLASTY Right 06/27/2016   Procedure: TOTAL KNEE ARTHROPLASTY;  Surgeon: Leandrew Koyanagi, MD;  Location: [facility];  Service: Orthopedics;  Laterality: Right;   Family History  Problem Relation Age of Onset  . Lung cancer Father        smoker  . Alcohol abuse Father   . Tuberculosis Father   . Heart disease Father   . Early death Father   . Diabetes Other   . Heart disease Mother   . Heart attack Brother   . Stroke Neg Hx   . Thyroid disease Neg Hx    Social History   Tobacco Use  . Smoking status:  Former Smoker    Quit date: 07/15/1968    Years since quitting: 51.7  . Smokeless tobacco: Never Used  . Tobacco comment: smoked Antoine, up to 1ppd  Vaping Use  . Vaping Use: Never used  Substance Use Topics  . Alcohol use: Not Currently  . Drug use: No   Current Outpatient Medications  Medication Sig Dispense Refill  . amLODipine (NORVASC) 10 MG tablet Take 1 tablet (10 mg total) by mouth daily. 90 tablet 1  . b complex vitamins tablet Take 1 tablet by mouth every other day.    . Cholecalciferol (VITAMIN D-3) 25 MCG (1000 UT) CAPS Take 1,000 Units by mouth daily with breakfast.    . fexofenadine (ALLEGRA) 180 MG tablet TAKE 1 TABLET BY MOUTH EVERY DAY 30 tablet 5  . fluticasone (FLONASE) 50 MCG/ACT nasal spray SPRAY 2 SPRAYS INTO EACH NOSTRIL EVERY DAY 16 mL 5  . lisinopril (ZESTRIL) 30 MG tablet Take 1 tablet (30 mg total) by mouth daily. Needs office visit prior to any additional refills 90 tablet 1  . MULTIPLE VITAMIN PO Take 1 tablet by mouth daily.    [name] omeprazole (PRILOSEC) 20 MG capsule Take 1 capsule (20 mg total) by mouth 2 (two) times daily. 60 capsule 2  . sucralfate (CARAFATE) 1 g tablet Take 1 tablet (1 g total) by mouth 4 (four) times daily -  with meals and at bedtime. 120 tablet 1  . Vitamin A 2250 MCG (7500 UT) CAPS Take 7,500 Units by mouth daily.     . vitamin C (ASCORBIC ACID) 500 MG tablet Take 1,000 mg by mouth daily.      No current facility-administered medications for this visit.   Allergies  Allergen Reactions  . Morphine And Related Other (See Comments)    Severe drop in blood pressure  . Citalopram Hydrobromide Other (See Comments)    PATIENT PREFERENCE: "Just ineffective"  . Sertraline Hcl Other (See Comments)    Headache     Review of Systems: All systems reviewed and negative except where noted in HPI.   Creatinine clearance cannot be calculated (Patient's most recent lab result is older than the maximum 21 days allowed.)   PHYSICAL EXAM      Wt Readings from Last 3 Encounters:  03/16/20 178 lb (80.7 kg)  03/06/20 180 lb (81.6 kg)  02/04/20 176 lb (79.8 kg)    BP (!) 160/80   Pulse (!) 52   Ht 5' 10.5" (1.791 m)   Wt 178 lb (80.7 kg)   BMI 25.18 kg/m  Constitutional:  Pleasant male in no acute distress. Psychiatric: Normal mood and affect. Behavior is normal. EENT: Pupils normal.  Conjunctivae are normal. No  scleral icterus. Neck supple.  Cardiovascular: Normal rate, regular rhythm. No edema Pulmonary/chest: Effort normal and breath sounds normal. No wheezing, rales or rhonchi. Abdominal: Soft, nondistended, mild epigastric tenderness.  Bowel sounds active throughout. There are no masses palpable. No hepatomegaly. Neurological: Alert and oriented to person place and time. Skin: Skin is warm and dry. No rashes noted.  Tye Savoy, NP  03/16/2020, 10:45 AM  Cc:  Referring Provider Raoul Pitch, Renee A, DO

## 2020-03-16 NOTE — Progress Notes (Signed)
Agree with the assessment with the following thoughts:  Nathan [name] when we discussed his case I did not realize his last colonoscopy was in 2012. With heme positive stools, while upper tract etiology may be more likely given his history, I do think we should proceed with colonoscopy at the same time. Can you add colonoscopy to his EGD if he is willing to do both we should proceed with both. Thanks

## 2020-03-21 ENCOUNTER — Encounter: Payer: Self-pay | Admitting: Gastroenterology

## 2020-03-22 ENCOUNTER — Telehealth: Payer: Self-pay | Admitting: Nurse Practitioner

## 2020-03-22 DIAGNOSIS — R3911 Hesitancy of micturition: Secondary | ICD-10-CM | POA: Diagnosis not present

## 2020-03-22 MED ORDER — SUPREP BOWEL PREP KIT 17.5-3.13-1.6 GM/177ML PO SOLN: ORAL | 0 refills | Status: DC

## 2020-03-22 NOTE — Telephone Encounter (Signed)
After reading [facility] office note again, I noticed Dr. Havery Moros commented and wanted Colonoscopy added to EGD that is already scheduled. Called patient back and rescheduled EGD/Colon to another day. Informed patient I will send prep to pharmacy and will put new instructions on my chart for patient.

## 2020-03-22 NOTE — Telephone Encounter (Signed)
Left message for patient to return my call.

## 2020-03-22 NOTE — Telephone Encounter (Signed)
Returning your call. Please call back to [phone]

## 2020-03-27 ENCOUNTER — Encounter: Payer: Medicare Other | Admitting: Gastroenterology

## 2020-03-28 ENCOUNTER — Other Ambulatory Visit: Payer: Self-pay | Admitting: Family Medicine

## 2020-03-30 ENCOUNTER — Other Ambulatory Visit: Payer: Self-pay

## 2020-03-30 MED ORDER — AMLODIPINE BESYLATE 10 MG PO TABS
10.0000 mg | ORAL_TABLET | Freq: Every day | ORAL | 1 refills | Status: DC
Start: 2020-03-30 — End: 2021-04-12

## 2020-04-05 ENCOUNTER — Ambulatory Visit (AMBULATORY_SURGERY_CENTER): Payer: Medicare Other | Admitting: Gastroenterology

## 2020-04-05 ENCOUNTER — Encounter: Payer: Self-pay | Admitting: Gastroenterology

## 2020-04-05 ENCOUNTER — Other Ambulatory Visit: Payer: Self-pay

## 2020-04-05 VITALS — BP 176/88 | HR 61 | Temp 97.1°F | Resp 14 | Ht 69.0 in | Wt 178.0 lb

## 2020-04-05 DIAGNOSIS — K6289 Other specified diseases of anus and rectum: Secondary | ICD-10-CM

## 2020-04-05 DIAGNOSIS — D127 Benign neoplasm of rectosigmoid junction: Secondary | ICD-10-CM | POA: Diagnosis not present

## 2020-04-05 DIAGNOSIS — K5730 Diverticulosis of large intestine without perforation or abscess without bleeding: Secondary | ICD-10-CM

## 2020-04-05 DIAGNOSIS — D123 Benign neoplasm of transverse colon: Secondary | ICD-10-CM

## 2020-04-05 DIAGNOSIS — D122 Benign neoplasm of ascending colon: Secondary | ICD-10-CM

## 2020-04-05 DIAGNOSIS — D125 Benign neoplasm of sigmoid colon: Secondary | ICD-10-CM

## 2020-04-05 DIAGNOSIS — D128 Benign neoplasm of rectum: Secondary | ICD-10-CM

## 2020-04-05 DIAGNOSIS — K6282 Dysplasia of anus: Secondary | ICD-10-CM | POA: Diagnosis not present

## 2020-04-05 DIAGNOSIS — R195 Other fecal abnormalities: Secondary | ICD-10-CM | POA: Diagnosis not present

## 2020-04-05 DIAGNOSIS — D649 Anemia, unspecified: Secondary | ICD-10-CM

## 2020-04-05 DIAGNOSIS — K3189 Other diseases of stomach and duodenum: Secondary | ICD-10-CM | POA: Diagnosis not present

## 2020-04-05 DIAGNOSIS — K2270 Barrett's esophagus without dysplasia: Secondary | ICD-10-CM

## 2020-04-05 MED ORDER — SODIUM CHLORIDE 0.9 % IV SOLN: 500.0000 mL | Freq: Once | INTRAVENOUS | Status: DC

## 2020-04-05 NOTE — Op Note (Signed)
[facility] Patient Name: Nathan [name] Procedure Date: 04/05/2020 11:10 AM MRN: [id number] Endoscopist: Nathan [name] , MD Age: 74 Referring MD:  Date of Birth: 26-Jul-1945 Gender: Male Account #: [id number] Procedure:                Colonoscopy Indications:              Heme positive stool Medicines:                Monitored Anesthesia Care Procedure:                Pre-Anesthesia Assessment:                           - Prior to the procedure, a History and Physical                            was performed, and patient medications and                            allergies were reviewed. The patient's tolerance of                            previous anesthesia was also reviewed. The risks                            and benefits of the procedure and the sedation                            options and risks were discussed with the patient.                            All questions were answered, and informed consent                            was obtained. Prior Anticoagulants: The patient has                            taken no previous anticoagulant or antiplatelet                            agents. ASA Grade Assessment: II - A patient with                            mild systemic disease. After reviewing the risks                            and benefits, the patient was deemed in                            satisfactory condition to undergo the procedure.                           After obtaining informed consent, the colonoscope  was passed under direct vision. Throughout the                            procedure, the patient's blood pressure, pulse, and                            oxygen saturations were monitored continuously. The                            Colonoscope was introduced through the anus and                            advanced to the the terminal ileum, with                            identification of the appendiceal orifice and IC                             valve. The colonoscopy was performed without                            difficulty. The patient tolerated the procedure                            well. The quality of the bowel preparation was                            adequate. The terminal ileum, ileocecal valve,                            appendiceal orifice, and rectum were photographed. Scope In: 11:25:38 AM Scope Out: 12:05:14 PM Scope Withdrawal Time: 0 hours 33 minutes 58 seconds  Total Procedure Duration: 0 hours 39 minutes 36 seconds  Findings:                 The perianal and digital rectal examinations were                            normal.                           The terminal ileum appeared normal.                           Localized mild inflammation characterized by                            erosions, erythema and friability was found in the                            cecum. Biopsies were taken with a cold forceps for                            histology.  A 3 mm polyp was found in the ascending colon. The                            polyp was sessile. The polyp was removed with a                            cold snare. Resection and retrieval were complete.                           A 5 mm polyp was found in the transverse colon. The                            polyp was sessile. The polyp was removed with a                            cold snare. Resection and retrieval were complete.                           A diminutive polyp was found in the transverse                            colon. The polyp was sessile. The polyp was removed                            with a cold biopsy forceps. Resection and retrieval                            were complete.                           Three sessile polyps were found in the sigmoid                            colon. The polyps were 3 to 4 mm in size. These                            polyps were removed with a cold snare.  Resection                            and retrieval were complete.                           A 20 mm polyp was found in the sigmoid colon. The                            polyp was pedunculated with a think stalk. The                            polyp was removed with a hot snare. Resection and                            retrieval were complete. To  prevent bleeding after                            the polypectomy, one hemostatic clip was                            successfully placed across the polypectomy defect.                            There was no bleeding at the end of the procedure.                            Area distal to the lesion was tattooed with an                            injection of Spot (carbon black).                           A 5 mm polyp was found in the rectum. The polyp was                            semi-pedunculated. The polyp was removed with a hot                            snare. Resection and retrieval were complete.                           A large broadbased polypoid vs. possible mass                            lesion was found in the proximal rectum, roughly                            10cm from anal verge. The lesion was sessile with                            concern for central umbilication. Biopsies were                            taken with a cold forceps for histology. Area                            distal and across the lumen from the lesion was                            tattooed with an injection of Spot (carbon black).                           Anal papilla(e) were hypertrophied. Biopsies were                            taken with a cold forceps for histology.  Internal hemorrhoids were found during retroflexion.                           Multiple medium-mouthed diverticula were found in                            the entire colon.                           The exam was otherwise without abnormality. Complications:             No immediate complications. Estimated blood loss:                            Minimal. Estimated Blood Loss:     Estimated blood loss was minimal. Impression:               - The examined portion of the ileum was normal.                           - Localized mild inflammation was found in the                            cecum. Biopsied.                           - One 3 mm polyp in the ascending colon, removed                            with a cold snare. Resected and retrieved.                           - One 5 mm polyp in the transverse colon, removed                            with a cold snare. Resected and retrieved.                           - One diminutive polyp in the transverse colon,                            removed with a cold biopsy forceps. Resected and                            retrieved.                           - Three 3 to 4 mm polyps in the sigmoid colon,                            removed with a cold snare. Resected and retrieved.                           - One 20 mm polyp in the sigmoid colon, removed  with a hot snare. Resected and retrieved. Clip was                            placed. Tattooed.                           - One 5 mm polyp in the rectum, removed with a hot                            snare. Resected and retrieved.                           - Large broad based polypoid / mass lesion in the                            proximal rectum. Biopsied. Tattooed.                           - Anal papilla(e) were hypertrophied. Biopsied.                           - Internal hemorrhoids.                           - Diverticulosis in the entire examined colon.                           - The examination was otherwise normal. Recommendation:           - Patient has a contact number available for                            emergencies. The signs and symptoms of potential                            delayed complications were discussed with  the                            patient. Return to normal activities tomorrow.                            Written discharge instructions were provided to the                            patient.                           - Resume previous diet.                           - Continue present medications.                           - Await pathology results with further  recommendations.                           - No ibuprofen, naproxen, or other non-steroidal                            anti-inflammatory drugs for 2 weeks after polyp                            removal. Nathan Lipps P. Nathan Vandoren, MD 04/05/2020 12:17:30 PM This report has been signed electronically.

## 2020-04-05 NOTE — Progress Notes (Signed)
Report to PACU, RN, vss, BBS= Clear.  

## 2020-04-05 NOTE — Progress Notes (Signed)
Called to room to assist during endoscopic procedure.  Patient ID and intended procedure confirmed with present staff. Received instructions for my participation in the procedure from the performing physician.  

## 2020-04-05 NOTE — Patient Instructions (Signed)
YOU HAD AN ENDOSCOPIC PROCEDURE TODAY AT [facility]:   Refer to the procedure report that was given to you for any specific questions about what was found during the examination.  If the procedure report does not answer your questions, please call your gastroenterologist to clarify.  If you requested that your care partner not be given the details of your procedure findings, then the procedure report has been included in a sealed envelope for you to review at your convenience later.  YOU SHOULD EXPECT: Some feelings of bloating in the abdomen. Passage of more gas than usual.  Walking can help get rid of the air that was put into your GI tract during the procedure and reduce the bloating. If you had a lower endoscopy (such as a colonoscopy or flexible sigmoidoscopy) you may notice spotting of blood in your stool or on the toilet paper. If you underwent a bowel prep for your procedure, you may not have a normal bowel movement for a few days.  **Handous given on Hiatal hernia, polyps, diverticulosis and hemorrhoids**  Please Note:  You might notice some irritation and congestion in your nose or some drainage.  This is from the oxygen used during your procedure.  There is no need for concern and it should clear up in a day or so.  SYMPTOMS TO REPORT IMMEDIATELY:   Following lower endoscopy (colonoscopy or flexible sigmoidoscopy):  Excessive amounts of blood in the stool  Significant tenderness or worsening of abdominal pains  Swelling of the abdomen that is new, acute  Fever of 100F or higher   Following upper endoscopy (EGD)  Vomiting of blood or coffee ground material  New chest pain or pain under the shoulder blades  Painful or persistently difficult swallowing  New shortness of breath  Fever of 100F or higher  Black, tarry-looking stools  For urgent or emergent issues, a gastroenterologist can be reached at any hour by calling [phone]. Do not use MyChart  messaging for urgent concerns.    DIET:  We do recommend a small meal at first, but then you may proceed to your regular diet.  Drink plenty of fluids but you should avoid alcoholic beverages for 24 hours.  ACTIVITY:  You should plan to take it easy for the rest of today and you should NOT DRIVE or use heavy machinery until tomorrow (because of the sedation medicines used during the test).    FOLLOW UP: Our staff will call the number listed on your records 48-72 hours following your procedure to check on you and address any questions or concerns that you may have regarding the information given to you following your procedure. If we do not reach you, we will leave a message.  We will attempt to reach you two times.  During this call, we will ask if you have developed any symptoms of COVID 19. If you develop any symptoms (ie: fever, flu-like symptoms, shortness of breath, cough etc.) before then, please call [phone].  If you test positive for Covid 19 in the 2 weeks post procedure, please call and report this information to [place].    If any biopsies were taken you will be contacted by phone or by letter within the next 1-3 weeks.  Please call us at [phone] if you have not heard about the biopsies in 3 weeks.    SIGNATURES/CONFIDENTIALITY: You and/or your care partner have signed paperwork which will be entered into your electronic medical record.  These signatures  attest to the fact that that the information above on your After Visit Summary has been reviewed and is understood.  Full responsibility of the confidentiality of this discharge information lies with you and/or your care-partner.

## 2020-04-05 NOTE — Op Note (Signed)
[facility] Patient Name: Bailen Geffre Procedure Date: 04/05/2020 11:12 AM MRN: [id number] Endoscopist: Remo Lipps P. Havery Moros , MD Age: 74 Referring MD:  Date of Birth: 02-09-1946 Gender: Male Account #: [id number] Procedure:                Upper GI endoscopy Indications:              Epigastric abdominal pain, Heme positive stool -                            history of NSAID use, stopped NSAIDs, trial of                            omeprazole, epigastric pain much improved Medicines:                Monitored Anesthesia Care Procedure:                Pre-Anesthesia Assessment:                           - Prior to the procedure, a History and Physical                            was performed, and patient medications and                            allergies were reviewed. The patient's tolerance of                            previous anesthesia was also reviewed. The risks                            and benefits of the procedure and the sedation                            options and risks were discussed with the patient.                            All questions were answered, and informed consent                            was obtained. Prior Anticoagulants: The patient has                            taken no previous anticoagulant or antiplatelet                            agents. ASA Grade Assessment: II - A patient with                            mild systemic disease. After reviewing the risks                            and benefits, the patient was deemed in  satisfactory condition to undergo the procedure.                           After obtaining informed consent, the endoscope was                            passed under direct vision. Throughout the                            procedure, the patient's blood pressure, pulse, and                            oxygen saturations were monitored continuously. The                            Endoscope was  introduced through the mouth, and                            advanced to the second part of duodenum. The upper                            GI endoscopy was accomplished without difficulty.                            The patient tolerated the procedure well. Scope In: Scope Out: Findings:                 Esophagogastric landmarks were identified: the                            Z-line was found at 38 cm, the gastroesophageal                            junction was found at 39 cm and the upper extent of                            the gastric folds was found at 40 cm from the                            incisors.                           A 1 cm hiatal hernia was present.                           The Z-line was irregular with a roughly 8-80mm                            tongue of salmon colored mucosa and was found 38 cm                            from the incisors. Biopsies were taken with a cold  forceps for histology.                           The exam of the esophagus was otherwise normal.                           The entire examined stomach was normal. Biopsies                            were taken with a cold forceps for Helicobacter                            pylori testing.                           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 The duodenal bulb and second portion                            of the duodenum were otherwise normal. Complications:            No immediate complications. Estimated blood loss:                            Minimal. Estimated Blood Loss:     Estimated blood loss was minimal. Impression:               - Esophagogastric landmarks identified.                           - 1 cm hiatal hernia.                           - Z-line irregular, 38 cm from the incisors.                            Biopsied.                           - Normal stomach. Biopsied to rule out H pylori                            - Normal duodenal bulb and second portion of the                            duodenum. Recommendation:           - Patient has a contact number available for                            emergencies. The signs and symptoms of potential                            delayed complications were discussed with the  patient. Return to normal activities tomorrow.                            Written discharge instructions were provided to the                            patient.                           - Resume previous diet.                           - Continue present medications.                           - Await pathology results. Remo Lipps P. Havery Moros, MD 04/05/2020 12:21:15 PM This report has been signed electronically.

## 2020-04-07 ENCOUNTER — Telehealth: Payer: Self-pay

## 2020-04-07 ENCOUNTER — Telehealth: Payer: Self-pay | Admitting: *Deleted

## 2020-04-07 NOTE — Telephone Encounter (Signed)
   No answer for post procedure call back. LEft message.

## 2020-04-07 NOTE — Telephone Encounter (Signed)
Left message on answering machine. 

## 2020-04-12 ENCOUNTER — Ambulatory Visit: Payer: Medicare Other | Admitting: Gastroenterology

## 2020-04-14 ENCOUNTER — Other Ambulatory Visit: Payer: Self-pay

## 2020-04-14 ENCOUNTER — Telehealth: Payer: Self-pay | Admitting: Gastroenterology

## 2020-04-14 DIAGNOSIS — K629 Disease of anus and rectum, unspecified: Secondary | ICD-10-CM

## 2020-04-14 NOTE — Telephone Encounter (Signed)
See results note dated 10/1

## 2020-04-14 NOTE — Telephone Encounter (Signed)
Patient called stated he is returning your call no other information provided

## 2020-04-17 ENCOUNTER — Ambulatory Visit: Payer: Medicare Other | Admitting: Endocrinology

## 2020-04-23 ENCOUNTER — Other Ambulatory Visit: Payer: Self-pay | Admitting: Family Medicine

## 2020-05-05 ENCOUNTER — Other Ambulatory Visit: Payer: Self-pay | Admitting: Family Medicine

## 2020-05-09 ENCOUNTER — Telehealth: Payer: Self-pay | Admitting: Gastroenterology

## 2020-05-09 NOTE — Telephone Encounter (Signed)
The pt asked about an alternate date in December.  However the dates he was interested in were not available. He states he will leave it as scheduled. He will call back with any further concerns

## 2020-05-09 NOTE — Telephone Encounter (Signed)
Pt wants to know if there is availability at the hospital in December so he can r/s his procedure that is scheduled for 11/10. Pls call him.

## 2020-05-10 ENCOUNTER — Ambulatory Visit: Payer: Medicare Other | Admitting: Endocrinology

## 2020-05-10 ENCOUNTER — Encounter: Payer: Self-pay | Admitting: Endocrinology

## 2020-05-10 ENCOUNTER — Other Ambulatory Visit: Payer: Self-pay

## 2020-05-10 VITALS — BP 150/78 | HR 60 | Ht 69.0 in | Wt 183.0 lb

## 2020-05-10 DIAGNOSIS — E0590 Thyrotoxicosis, unspecified without thyrotoxic crisis or storm: Secondary | ICD-10-CM

## 2020-05-10 LAB — TSH: TSH: 4.45 u[IU]/mL (ref 0.35–4.50)

## 2020-05-10 LAB — T4, FREE: Free T4: 0.84 ng/dL (ref 0.60–1.60)

## 2020-05-10 NOTE — Progress Notes (Signed)
Subjective:    Patient ID: Nathan [name], male    DOB: 25-Feb-1946, 74 y.o.   MRN: [id number]  HPI Pt returns for f/u of hyperthyroidism (dx'ed 2021; he was rx'ed tapazole, but he developed hypothyroidism, new, due to tapazole, so he may have subacute thyroiditis).  Since off tapazole, fatigue and depression have recurred.   Past Medical History:  Diagnosis Date  . Adrenal mass ([place]) 2018   Urology fully evaluated; benign  . Allergy    hay fever  . Anxiety   . Cataract   . Cervicalgia   . Complication of anesthesia    hypoglycemia, low blood pressure, pt. reports it dropped to zero- post op MORPHINE , then taken ICU due to either the morphine or hypoglycemia . In addition post op from cataract surgery- 07/2015, experienced an episode of hypoglycemia    . Depressive disorder, not elsewhere classified   . Diverticulosis   . Elevated prostate specific antigen (PSA) 2008   Dr Jeffie Pollock  . Gallstone   . H/O cardiovascular stress test >10 yrs.   told that its wnl. Told to drink less coffee  . Hematuria 2018   Hematuria with enlarged prostate, kidney cyst, adrenal adenoma--> followed by urology with cystoscopy and repeat CT, benign workup.  [name] Hx of migraines   . Hypertension   . Hypoglycemia   . IBS (irritable bowel syndrome)   . Inguinal hernia 12/2015   Bilateral small indirect hernias on CT, umbilical hernia also present.  . Kidney cyst, acquired 2018   Followed by urology, benign.  . Low back pain   . Macular degeneration (senile) of retina, unspecified    2 different opinions from 2 different Ophth  . Osteoarthrosis, unspecified whether generalized or localized, unspecified site   . Other and unspecified hyperlipidemia   . Prostatitis 2008  . Refusal of blood transfusions as patient is Jehovah's Witness   . Segmental and somatic dysfunction of cervical region   . Segmental and somatic dysfunction of lumbar region   . Segmental and somatic dysfunction of sacral region   .  Segmental and somatic dysfunction of thoracic region   . Spondylosis of lumbar spine   . Strain of pelvis   . 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    Past Surgical History:  Procedure Laterality Date  . CATARACT EXTRACTION, BILATERAL    . COLONOSCOPY  2012   Dr Olevia Perches  . CYSTOSCOPY     for hematuria  . hydrocoelectomy  2003   Dr Jeffie Pollock  . KNEE ARTHROSCOPY Left 1994  . prostate nodule resection  2003  . TONSILLECTOMY    . TOTAL KNEE ARTHROPLASTY  2009  . TOTAL KNEE ARTHROPLASTY Right 06/27/2016   Procedure: TOTAL KNEE ARTHROPLASTY;  Surgeon: Leandrew Koyanagi, MD;  Location: [facility];  Service: Orthopedics;  Laterality: Right;    Social History   Socioeconomic History  . Marital status: Married    Spouse name: Not on file  . Number of children: Not on file  . Years of education: Not on file  . Highest education level: Not on file  Occupational History  . Not on file  Tobacco Use  . Smoking status: Former Smoker    Quit date: 07/15/1968    Years since quitting: 51.8  . Smokeless tobacco: Never Used  . Tobacco comment: smoked [place], up to 1ppd  Vaping Use  . Vaping Use: Never used  Substance and Sexual Activity  .  Alcohol use: Not Currently  . Drug use: No  . Sexual activity: Yes    Partners: Female    Birth control/protection: None  Other Topics Concern  . Not on file  Social History Narrative   Married. Wife's name is [name]. Full time employed, Pensions consultant.   Drinks caffeinated beverages. Take a multivitamin. Wears his seatbelt.   Exercises at least 3 times a week. Is not a strict vegetarian, but only eats meat 1-2 times a week   Wears a hearing aid. Does not wear dentures or use an assistive walking device.   There is a smoke detector in his home.   He feels safe in his relationships.   Social Determinants of Health   Financial Resource Strain:   . Difficulty of Paying Living Expenses: Not on file  Food Insecurity:    . Worried About Charity fundraiser in the Last Year: Not on file  . Ran Out of Food in the Last Year: Not on file  Transportation Needs:   . Lack of Transportation (Medical): Not on file  . Lack of Transportation (Non-Medical): Not on file  Physical Activity:   . Days of Exercise per Week: Not on file  . Minutes of Exercise per Session: Not on file  Stress:   . Feeling of Stress : Not on file  Social Connections:   . Frequency of Communication with Friends and Family: Not on file  . Frequency of Social Gatherings with Friends and Family: Not on file  . Attends Religious Services: Not on file  . Active Member of Clubs or Organizations: Not on file  . Attends Archivist Meetings: Not on file  . Marital Status: Not on file  Intimate Partner Violence:   . Fear of Current or Ex-Partner: Not on file  . Emotionally Abused: Not on file  . Physically Abused: Not on file  . Sexually Abused: Not on file    Current Outpatient Medications on File Prior to Visit  Medication Sig Dispense Refill  . amLODipine (NORVASC) 10 MG tablet Take 1 tablet (10 mg total) by mouth daily. 90 tablet 1  . b complex vitamins tablet Take 1 tablet by mouth every other day.    . Cholecalciferol (VITAMIN D-3) 25 MCG (1000 UT) CAPS Take 1,000 Units by mouth daily with breakfast.    . fexofenadine (ALLEGRA) 180 MG tablet TAKE 1 TABLET BY MOUTH EVERY DAY 30 tablet 5  . fluticasone (FLONASE) 50 MCG/ACT nasal spray SPRAY 2 SPRAYS INTO EACH NOSTRIL EVERY DAY 16 mL 5  . lisinopril (ZESTRIL) 30 MG tablet Take 1 tablet (30 mg total) by mouth daily. Needs office visit prior to any additional refills 90 tablet 1  . MULTIPLE VITAMIN PO Take 1 tablet by mouth daily.    [name] omeprazole (PRILOSEC) 20 MG capsule TAKE 1 CAPSULE BY MOUTH TWICE A DAY 180 capsule 1  . sucralfate (CARAFATE) 1 g tablet Take 1 tablet (1 g total) by mouth 4 (four) times daily -  with meals and at bedtime. 120 tablet 1  . Vitamin A 2250 MCG (7500  UT) CAPS Take 7,500 Units by mouth daily.     . vitamin C (ASCORBIC ACID) 500 MG tablet Take 1,000 mg by mouth daily.      No current facility-administered medications on file prior to visit.    Allergies  Allergen Reactions  . Morphine And Related Other (See Comments)    Severe drop in blood pressure  . Citalopram  Hydrobromide Other (See Comments)    PATIENT PREFERENCE: "Just ineffective"  . Sertraline Hcl Other (See Comments)    Headache    Family History  Problem Relation Age of Onset  . Lung cancer Father        smoker  . Alcohol abuse Father   . Tuberculosis Father   . Heart disease Father   . Early death Father   . Diabetes Other   . Heart disease Mother   . Heart attack Brother   . Stroke Neg Hx   . Thyroid disease Neg Hx   . Colon cancer Neg Hx   . Stomach cancer Neg Hx   . Rectal cancer Neg Hx   . Esophageal cancer Neg Hx     BP (!) 150/78   Pulse 60   Ht 5\' 9"  (1.753 m)   Wt 183 lb (83 kg)   BMI 27.02 kg/m   Review of Systems He has gained a few lbs.      Objective:   Physical Exam VITAL SIGNS:  See vs page GENERAL: no distress NECK: There is no palpable thyroid enlargement.  No thyroid nodule is palpable.  No palpable lymphadenopathy at the anterior neck..    Lab Results  Component Value Date   TSH 4.45 05/10/2020      Assessment & Plan:  Autoimmune thyroid dz.  Euthyroid now.  I told pt no medication is needed now.  HTN: is noted today Depression: not thyroid-related.  We'll follow.  Patient Instructions  Your blood pressure is high today.  Please see your primary care provider soon, to have it rechecked Blood tests are requested for you today.  We'll let you know about the results.  Please come back for a follow-up appointment in 1 month.

## 2020-05-10 NOTE — Patient Instructions (Addendum)
Your blood pressure is high today.  Please see your primary care provider soon, to have it rechecked.   Blood tests are requested for you today.  We'll let you know about the results.   Please come back for a follow-up appointment in 1 month.   

## 2020-05-17 ENCOUNTER — Other Ambulatory Visit: Payer: Medicare Other

## 2020-05-17 DIAGNOSIS — C189 Malignant neoplasm of colon, unspecified: Secondary | ICD-10-CM | POA: Diagnosis not present

## 2020-05-17 DIAGNOSIS — K629 Disease of anus and rectum, unspecified: Secondary | ICD-10-CM

## 2020-05-18 LAB — CEA: CEA: 0.6 ng/mL

## 2020-05-19 ENCOUNTER — Ambulatory Visit (INDEPENDENT_AMBULATORY_CARE_PROVIDER_SITE_OTHER): Payer: Medicare Other | Admitting: Gastroenterology

## 2020-05-19 ENCOUNTER — Encounter: Payer: Self-pay | Admitting: Gastroenterology

## 2020-05-19 ENCOUNTER — Other Ambulatory Visit (INDEPENDENT_AMBULATORY_CARE_PROVIDER_SITE_OTHER): Payer: Medicare Other

## 2020-05-19 VITALS — BP 128/62 | HR 63 | Ht 69.0 in | Wt 186.0 lb

## 2020-05-19 DIAGNOSIS — D126 Benign neoplasm of colon, unspecified: Secondary | ICD-10-CM | POA: Diagnosis not present

## 2020-05-19 DIAGNOSIS — K621 Rectal polyp: Secondary | ICD-10-CM

## 2020-05-19 DIAGNOSIS — K6282 Dysplasia of anus: Secondary | ICD-10-CM | POA: Diagnosis not present

## 2020-05-19 DIAGNOSIS — R933 Abnormal findings on diagnostic imaging of other parts of digestive tract: Secondary | ICD-10-CM

## 2020-05-19 LAB — BASIC METABOLIC PANEL
BUN: 15 mg/dL (ref 6–23)
CO2: 27 mEq/L (ref 19–32)
Calcium: 8.6 mg/dL (ref 8.4–10.5)
Chloride: 105 mEq/L (ref 96–112)
Creatinine, Ser: 1.06 mg/dL (ref 0.40–1.50)
GFR: 69.18 mL/min (ref >60.00)
Glucose, Bld: 97 mg/dL (ref 70–99)
Potassium: 4.1 mEq/L (ref 3.5–5.1)
Sodium: 139 mEq/L (ref 135–145)

## 2020-05-19 LAB — CBC
HCT: 40.3 % (ref 39.0–52.0)
Hemoglobin: 13.4 g/dL (ref 13.0–17.0)
MCHC: 33.2 g/dL (ref 30.0–36.0)
MCV: 88.4 fl (ref 78.0–100.0)
Platelets: 265 10*3/uL (ref 150.0–400.0)
RBC: 4.56 Mil/uL (ref 4.22–5.81)
RDW: 16.4 % — ABNORMAL HIGH (ref 11.5–15.5)
WBC: 7.5 10*3/uL (ref 4.0–10.5)

## 2020-05-19 LAB — PROTIME-INR
INR: 1 ratio (ref 0.8–1.0)
Prothrombin Time: 11.5 s (ref 9.6–13.1)

## 2020-05-19 NOTE — Progress Notes (Signed)
ela

## 2020-05-19 NOTE — Patient Instructions (Signed)
START: Miralax - 1 capful dissolved in at least 8 ounces of water daily for 1 week prior to colonoscopy. Please also follow colonoscopy instructions.   Your provider has requested that you go to the basement level for lab work before leaving today. Press "B" on the elevator. The lab is located at the first door on the left as you exit the elevator.  You have been scheduled for a colonoscopy. Please follow written instructions given to you at your visit today.  Please pick up your prep supplies at the pharmacy within the next 1-3 days. If you use inhalers (even only as needed), please bring them with you on the day of your procedure.   Due to recent changes in healthcare laws, you may see the results of your imaging and laboratory studies on MyChart before your provider has had a chance to review them.  We understand that in some cases there may be results that are confusing or concerning to you. Not all laboratory results come back in the same time frame and the provider may be waiting for multiple results in order to interpret others.  Please give [place] 48 hours in order for your provider to thoroughly review all the results before contacting the office for clarification of your results.     Thank you for choosing me and [facility].  Dr. Rush Landmark

## 2020-05-20 ENCOUNTER — Other Ambulatory Visit (HOSPITAL_COMMUNITY)
Admission: RE | Admit: 2020-05-20 | Discharge: 2020-05-20 | Disposition: A | Discharge status: Home / Self Care | Payer: Medicare Other | Source: Ambulatory Visit | Attending: Gastroenterology | Admitting: Gastroenterology

## 2020-05-20 DIAGNOSIS — Z20822 Contact with and (suspected) exposure to covid-19: Secondary | ICD-10-CM | POA: Diagnosis not present

## 2020-05-20 DIAGNOSIS — R933 Abnormal findings on diagnostic imaging of other parts of digestive tract: Secondary | ICD-10-CM | POA: Insufficient documentation

## 2020-05-20 DIAGNOSIS — Z01812 Encounter for preprocedural laboratory examination: Secondary | ICD-10-CM | POA: Diagnosis not present

## 2020-05-20 DIAGNOSIS — D126 Benign neoplasm of colon, unspecified: Secondary | ICD-10-CM | POA: Insufficient documentation

## 2020-05-20 DIAGNOSIS — K621 Rectal polyp: Secondary | ICD-10-CM | POA: Insufficient documentation

## 2020-05-20 DIAGNOSIS — K6282 Dysplasia of anus: Secondary | ICD-10-CM | POA: Insufficient documentation

## 2020-05-20 LAB — SARS CORONAVIRUS 2 (TAT 6-24 HRS): SARS Coronavirus 2: NEGATIVE

## 2020-05-20 NOTE — Progress Notes (Signed)
[place] VISIT   Primary Care Provider [place], [place] [address] [place]  [address] [phone]  Referring Provider Dr. Havery Moros  Patient Profile: Nathan [name] is a 74 y.o. male with a pmh significant for cataracts, hypertension chronic back pain, GERD, colon polyps (adenomatous), AIN 1.  The patient presents to the [facility] for an evaluation and management of problem(s) noted below:  Problem List 1. 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 Rectal polyp   3. Abnormal colonoscopy   4. AIN grade I     History of Present Illness Please see prior progress notes by Dr. Havery Moros and NP Chester Holstein for full details of HPI.  Interval History The patient was evaluated in September for heme positive stools and normocytic anemia.  He underwent an EGD and colonoscopy for further evaluation.  Colonoscopy found multiple colon polyps as well as a large rectal TVA with high-grade dysplasia.  He was also found to have AIN 1 and is to be evaluated by colorectal surgery at some point in the future for further therapy of this.  Patient had undergone a CT scan earlier this summer that did not show any evidence of a rectal mass.  It is for this reason that the patient was referred for consideration of advanced polyp resection attempt.  The patient had a CEA level done this last week and it returned normal.  Patient is not having any other symptoms at this time.  Patient denies any significant pain or discomfort.  Patient is hopeful that we can help spare a colon resection.  GI Review of Systems Positive as above Negative for dysphagia, nausea, vomiting, pain, melena, hematochezia  Review of Systems General: Denies fevers/chills/weight loss unintentionally Cardiovascular: Denies chest pain Pulmonary: Denies shortness of breath Gastroenterological: See HPI Genitourinary: Denies darkened urine Hematological: Denies easy  bruising/bleeding Dermatological: Denies jaundice Psychological: Mood is stable   Medications Current Outpatient Medications  Medication Sig Dispense Refill   amLODipine (NORVASC) 10 MG tablet Take 1 tablet (10 mg total) by mouth daily. 90 tablet 1   b complex vitamins tablet Take 1 tablet by mouth every other day.     fexofenadine (ALLEGRA) 180 MG tablet TAKE 1 TABLET BY MOUTH EVERY DAY 30 tablet 5   fluticasone (FLONASE) 50 MCG/ACT nasal spray SPRAY 2 SPRAYS INTO EACH NOSTRIL EVERY DAY (Patient taking differently: Place 2 sprays into both nostrils daily. ) 16 mL 5   lisinopril (ZESTRIL) 30 MG tablet Take 1 tablet (30 mg total) by mouth daily. Needs office visit prior to any additional refills (Patient taking differently: Take 30 mg by mouth daily. ) 90 tablet 1   Multiple Vitamin tablet Take 1 tablet by mouth daily.      omeprazole (PRILOSEC) 20 MG capsule TAKE 1 CAPSULE BY MOUTH TWICE A DAY (Patient taking differently: Take 20 mg by mouth 2 (two) times daily before a meal. ) 180 capsule 1   No current facility-administered medications for this visit.    Allergies Allergies  Allergen Reactions   Morphine And Related Other (See Comments)    Severe drop in blood pressure   Citalopram Hydrobromide Other (See Comments)    PATIENT PREFERENCE: "Just ineffective"   Sertraline Hcl Other (See Comments)    Headache    Histories Past Medical History:  Diagnosis Date   Adrenal mass ([place]) 2018   Urology fully evaluated; benign   Allergy    hay fever   Anxiety  Cataract    Cervicalgia    Complication of anesthesia    hypoglycemia, low blood pressure, pt. reports it dropped to zero- post op MORPHINE , then taken ICU due to either the morphine or hypoglycemia . In addition post op from cataract surgery- 07/2015, experienced an episode of hypoglycemia     Depressive disorder, not elsewhere classified    Diverticulosis    Elevated prostate specific antigen (PSA) 2008     Dr Jeffie Pollock   Gallstone    H/O cardiovascular stress test >10 yrs.   told that its wnl. Told to drink less coffee   Hematuria 2018   Hematuria with enlarged prostate, kidney cyst, adrenal adenoma--> followed by urology with cystoscopy and repeat CT, benign workup.   Hx of migraines    Hypertension    Hypoglycemia    IBS (irritable bowel syndrome)    Inguinal hernia 12/2015   Bilateral small indirect hernias on CT, umbilical hernia also present.   Kidney cyst, acquired 2018   Followed by urology, benign.   Low back pain    Macular degeneration (senile) of retina, unspecified    2 different opinions from 2 different Ophth   Osteoarthrosis, unspecified whether generalized or localized, unspecified site    Other and unspecified hyperlipidemia    Prostatitis 2008   Refusal of blood transfusions as patient is Jehovah's Witness    Segmental and somatic dysfunction of cervical region    Segmental and somatic dysfunction of lumbar region    Segmental and somatic dysfunction of sacral region    Segmental and somatic dysfunction of thoracic region    Spondylosis of lumbar spine    Strain of pelvis    Tuberculosis    had exposure as child tests positive on skin test.   Past Surgical History:  Procedure Laterality Date   CATARACT EXTRACTION, BILATERAL     COLONOSCOPY  2012   Dr Olevia Perches   CYSTOSCOPY     for hematuria   hydrocoelectomy  2003   Dr Jeffie Pollock   KNEE ARTHROSCOPY Left 1994   prostate nodule resection  2003   TONSILLECTOMY     TOTAL KNEE ARTHROPLASTY  2009   TOTAL KNEE ARTHROPLASTY Right 06/27/2016   Procedure: TOTAL KNEE ARTHROPLASTY;  Surgeon: Leandrew Koyanagi, MD;  Location: [facility];  Service: Orthopedics;  Laterality: Right;   Social History   Socioeconomic History   Marital status: Married    Spouse name: Not on file   Number of children: Not on file   Years of education: Not on file   Highest education level: Not on file  Occupational  History   Not on file  Tobacco Use   Smoking status: Former Smoker    Quit date: 07/15/1968    Years since quitting: 51.8   Smokeless tobacco: Never Used   Tobacco comment: smoked [place], up to 1ppd  Vaping Use   Vaping Use: Never used  Substance and Sexual Activity   Alcohol use: Not Currently   Drug use: No   Sexual activity: Yes    Partners: Female    Birth control/protection: None  Other Topics Concern   Not on file  Social History Narrative   Married. Wife's name is [name]. Full time employed, Pensions consultant.   Drinks caffeinated beverages. Take a multivitamin. Wears his seatbelt.   Exercises at least 3 times a week. Is not a strict vegetarian, but only eats meat 1-2 times a week   Wears a hearing aid. Does  not wear dentures or use an assistive walking device.   There is a smoke detector in his home.   He feels safe in his relationships.   Social Determinants of Health   Financial Resource Strain:    Difficulty of Paying Living Expenses: Not on file  Food Insecurity:    Worried About Charity fundraiser in the Last Year: Not on file   [employer] of Food in the Last Year: Not on file  Transportation Needs:    Lack of Transportation (Medical): Not on file   Lack of Transportation (Non-Medical): Not on file  Physical Activity:    Days of Exercise per Week: Not on file   Minutes of Exercise per Session: Not on file  Stress:    Feeling of Stress : Not on file  Social Connections:    Frequency of Communication with Friends and Family: Not on file   Frequency of Social Gatherings with Friends and Family: Not on file   Attends Religious Services: Not on file   Active Member of Clubs or Organizations: Not on file   Attends Archivist Meetings: Not on file   Marital Status: Not on file  Intimate Partner Violence:    Fear of Current or Ex-Partner: Not on file   Emotionally Abused: Not on file   Physically Abused: Not on file    Sexually Abused: Not on file   Family History  Problem Relation Age of Onset   Lung cancer Father        smoker   Alcohol abuse Father    Tuberculosis Father    Heart disease Father    Early death Father    Diabetes Other    Heart disease Mother    Heart attack Brother    Stroke Neg Hx    Thyroid disease Neg Hx    Colon cancer Neg Hx    Stomach cancer Neg Hx    Rectal cancer Neg Hx    Esophageal cancer Neg Hx    Inflammatory bowel disease Neg Hx    Liver disease Neg Hx    Pancreatic cancer Neg Hx    I have reviewed his medical, social, and family history in detail and updated the electronic medical record as necessary.    PHYSICAL EXAMINATION  BP 128/62    Pulse 63    Ht 5\' 9"  (1.753 m)    Wt 186 lb (84.4 kg)    BMI 27.47 kg/m  Wt Readings from Last 3 Encounters:  05/19/20 186 lb (84.4 kg)  05/10/20 183 lb (83 kg)  04/05/20 178 lb (80.7 kg)  GEN: NAD, appears stated age, doesn't appear chronically ill PSYCH: Cooperative, without pressured speech EYE: Conjunctivae pink, sclerae anicteric ENT: MMM CV: Nontachycardic RESP: No audible wheezing GI: NABS, soft, NT/ND, without rebound or guarding MSK/EXT: No lower extremity edema SKIN: No jaundice NEURO:  Alert & Oriented x 3, no focal deficits   REVIEW OF DATA  I reviewed the following data at the time of this encounter:  GI Procedures and Studies  September 2021 colonoscopy - The examined portion of the ileum was normal. - Localized mild inflammation was found in the cecum. Biopsied. - One 3 mm polyp in the ascending colon, removed with a cold snare. Resected and retrieved. - One 5 mm polyp in the transverse colon, removed with a cold snare. Resected and retrieved. - One diminutive polyp in the transverse colon, removed with a cold biopsy forceps. Resected and retrieved. Pathology Diagnosis  3. Surgical [P], colon, cecal inflammation - COLONIC MUCOSA WITH NO SPECIFIC HISTOPATHOLOGIC CHANGES.  SEE NOTE - NEGATIVE FOR ACUTE INFLAMMATION, FEATURES OF CHRONICITY, GRANULOMAS OR DYSPLASIA 4. Surgical [P], colon, rectosigmoid, rectal, polyp (8) - TUBULAR ADENOMA(S) WITHOUT HIGH-GRADE DYSPLASIA OR MALIGNANCY - INFLAMMATORY POLYP(S) 5. Surgical [P], colon, distal sigmoid, polyp - TUBULOVILLOUS ADENOMA WITH FOCAL GLANDULAR MISPLACEMENT INTO THE POLYP STALK (PROLAPSE) - NEGATIVE FOR HIGH-GRADE DYSPLASIA OR CARCINOMA - LATERAL MUCOSAL MARGINS ARE NEGATIVE FOR DYSPLASIA. 6. Surgical [P], colon, rectal mass - SUPERFICIAL FRAGMENTS OF TUBULAR ADENOMA(S) WITH HIGH-GRADE DYSPLASIA. SEE NOTE 7. Surgical [P], colon, anal papilla - LOW-GRADE SQUAMOUS INTRAEPITHELIAL LESION (AIN1, LOW GRADE DYSPLASIA)  Laboratory Studies  Reviewed those in epic Negative CEA from last week  Imaging Studies  July 2021 CT abdomen pelvis with contrast IMPRESSION: Sigmoid diverticulosis.  Large stool burden. Aortoiliac atherosclerosis. Prostate enlargement. No acute findings.   ASSESSMENT  Mr. Siemen is a 74 y.o. male with a pmh significant for cataracts, hypertension chronic back pain, GERD, colon polyps (adenomatous), AIN 1.  The patient is seen today for evaluation and management of:  1. 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 Rectal polyp   3. Abnormal colonoscopy   4. AIN grade I    The patient is clinically and hemodynamically stable.  Based upon the description and endoscopic pictures I do feel that it is reasonable to pursue an Advanced Polypectomy attempt of the polyp/lesion.  I will have endoscopic ultrasound available as well to further evaluate the lesion.  We discussed some of the techniques of advanced polypectomy which include Endoscopic Mucosal Resection, OVESCO Full-Thickness Resection, Endorotor Morcellation, and Tissue Ablation via Fulguration.  The risks and benefits of endoscopic evaluation were discussed with the patient; these include but are not limited to the risk of perforation,  infection, bleeding, missed lesions, lack of diagnosis, severe illness requiring hospitalization, as well as anesthesia and sedation related illnesses.  During attempts at advanced resection, the risks of bleeding and perforation/leak are increased as opposed to diagnostic and screening procedures, and that was discussed with the patient as well.   In addition, I explained that with the possible need for piecemeal resection, subsequent short-interval endoscopic evaluation for follow up and potential retreatment of the lesion/area may be necessary.  I did offer, a referral to surgery in order for patient to have opportunity to discuss surgical management/intervention prior to finalizing decision for attempt at endoscopic removal, however, the patient deferred on this.  If, after attempt at removal of the polyp/lesion, it is found that the patient has a complication or that an invasive lesion or malignant lesion is found, or that the polyp/lesion continues to recur, the patient is aware and understands that surgery may still be indicated/required.  All patient questions were answered, to the best of my ability, and the patient agrees to the aforementioned plan of action with follow-up as indicated.   PLAN  Preprocedure labs to be obtained Proceed with already scheduled colonoscopy with EMR attempt next week with endoscopic ultrasound available if needed Patient will still need colorectal surgery evaluation for AIN 1 hopefully will not need anything for the rectal polyp however we will see   Orders Placed This Encounter  Procedures   CBC   Basic Metabolic Panel (BMET)   INR/PT    New Prescriptions   No medications on file   Modified Medications   No medications on file    Planned Follow Up No follow-ups on file.   Total Time  in [place] and in Index for patient including independent/personal interpretation/review of prior testing, medical history, examination, medication  adjustment, communicating results with the patient directly, and documentation with the EHR is 30 minutes.   Justice Britain, MD [facility] Advanced Endoscopy Office # [phone]

## 2020-05-20 NOTE — H&P (View-Only) (Signed)
[facility] VISIT   Primary Care Provider Ma [place], [place] [address] [place] [address] [phone]  Referring Provider Dr. Havery Moros  Patient Profile: Nathan [name] is a 74 y.o. male with a pmh significant for cataracts, hypertension chronic back pain, GERD, colon polyps (adenomatous), AIN 1.  The patient presents to the [facility] for an evaluation and management of problem(s) noted below:  Problem List 1. 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 Rectal polyp   3. Abnormal colonoscopy   4. AIN grade I     History of Present Illness Please see prior progress notes by Dr. Havery Moros and NP Chester Holstein for full details of HPI.  Interval History The patient was evaluated in September for heme positive stools and normocytic anemia.  He underwent an EGD and colonoscopy for further evaluation.  Colonoscopy found multiple colon polyps as well as a large rectal TVA with high-grade dysplasia.  He was also found to have AIN 1 and is to be evaluated by colorectal surgery at some point in the future for further therapy of this.  Patient had undergone a CT scan earlier this summer that did not show any evidence of a rectal mass.  It is for this reason that the patient was referred for consideration of advanced polyp resection attempt.  The patient had a CEA level done this last week and it returned normal.  Patient is not having any other symptoms at this time.  Patient denies any significant pain or discomfort.  Patient is hopeful that we can help spare a colon resection.  GI Review of Systems Positive as above Negative for dysphagia, nausea, vomiting, pain, melena, hematochezia  Review of Systems General: Denies fevers/chills/weight loss unintentionally Cardiovascular: Denies chest pain Pulmonary: Denies shortness of breath Gastroenterological: See HPI Genitourinary: Denies darkened urine Hematological: Denies easy  bruising/bleeding Dermatological: Denies jaundice Psychological: Mood is stable   Medications Current Outpatient Medications  Medication Sig Dispense Refill  . amLODipine (NORVASC) 10 MG tablet Take 1 tablet (10 mg total) by mouth daily. 90 tablet 1  . b complex vitamins tablet Take 1 tablet by mouth every other day.    . fexofenadine (ALLEGRA) 180 MG tablet TAKE 1 TABLET BY MOUTH EVERY DAY 30 tablet 5  . fluticasone (FLONASE) 50 MCG/ACT nasal spray SPRAY 2 SPRAYS INTO EACH NOSTRIL EVERY DAY (Patient taking differently: Place 2 sprays into both nostrils daily. ) 16 mL 5  . lisinopril (ZESTRIL) 30 MG tablet Take 1 tablet (30 mg total) by mouth daily. Needs office visit prior to any additional refills (Patient taking differently: Take 30 mg by mouth daily. ) 90 tablet 1  . Multiple Vitamin tablet Take 1 tablet by mouth daily.     [name] omeprazole (PRILOSEC) 20 MG capsule TAKE 1 CAPSULE BY MOUTH TWICE A DAY (Patient taking differently: Take 20 mg by mouth 2 (two) times daily before a meal. ) 180 capsule 1   No current facility-administered medications for this visit.    Allergies Allergies  Allergen Reactions  . Morphine And Related Other (See Comments)    Severe drop in blood pressure  . Citalopram Hydrobromide Other (See Comments)    PATIENT PREFERENCE: "Just ineffective"  . Sertraline Hcl Other (See Comments)    Headache    Histories Past Medical History:  Diagnosis Date  . Adrenal mass ([place]) 2018   Urology fully evaluated; benign  . Allergy    hay fever  . Anxiety   .  Cataract   . Cervicalgia   . Complication of anesthesia    hypoglycemia, low blood pressure, pt. reports it dropped to zero- post op MORPHINE , then taken ICU due to either the morphine or hypoglycemia . In addition post op from cataract surgery- 07/2015, experienced an episode of hypoglycemia    . Depressive disorder, not elsewhere classified   . Diverticulosis   . Elevated prostate specific antigen (PSA) 2008     Dr Jeffie Pollock  . Gallstone   . H/O cardiovascular stress test >10 yrs.   told that its wnl. Told to drink less coffee  . Hematuria 2018   Hematuria with enlarged prostate, kidney cyst, adrenal adenoma--> followed by urology with cystoscopy and repeat CT, benign workup.  [name] Hx of migraines   . Hypertension   . Hypoglycemia   . IBS (irritable bowel syndrome)   . Inguinal hernia 12/2015   Bilateral small indirect hernias on CT, umbilical hernia also present.  . Kidney cyst, acquired 2018   Followed by urology, benign.  . Low back pain   . Macular degeneration (senile) of retina, unspecified    2 different opinions from 2 different Ophth  . Osteoarthrosis, unspecified whether generalized or localized, unspecified site   . Other and unspecified hyperlipidemia   . Prostatitis 2008  . Refusal of blood transfusions as patient is Jehovah's Witness   . Segmental and somatic dysfunction of cervical region   . Segmental and somatic dysfunction of lumbar region   . Segmental and somatic dysfunction of sacral region   . Segmental and somatic dysfunction of thoracic region   . Spondylosis of lumbar spine   . Strain of pelvis   . 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   Past Surgical History:  Procedure Laterality Date  . CATARACT EXTRACTION, BILATERAL    . COLONOSCOPY  2012   Dr Olevia Perches  . CYSTOSCOPY     for hematuria  . hydrocoelectomy  2003   Dr Jeffie Pollock  . KNEE ARTHROSCOPY Left 1994  . prostate nodule resection  2003  . TONSILLECTOMY    . TOTAL KNEE ARTHROPLASTY  2009  . TOTAL KNEE ARTHROPLASTY Right 06/27/2016   Procedure: TOTAL KNEE ARTHROPLASTY;  Surgeon: Leandrew Koyanagi, MD;  Location: [facility];  Service: Orthopedics;  Laterality: Right;   Social History   Socioeconomic History  . Marital status: Married    Spouse name: Not on file  . Number of children: Not on file  . Years of education: Not on file  . Highest education level: Not on file  Occupational  History  . Not on file  Tobacco Use  . Smoking status: Former Smoker    Quit date: 07/15/1968    Years since quitting: 51.8  . Smokeless tobacco: Never Used  . Tobacco comment: smoked [place], up to 1ppd  Vaping Use  . Vaping Use: Never used  Substance and Sexual Activity  . Alcohol use: Not Currently  . Drug use: No  . Sexual activity: Yes    Partners: Female    Birth control/protection: None  Other Topics Concern  . Not on file  Social History Narrative   Married. Wife's name is [name]. Full time employed, Pensions consultant.   Drinks caffeinated beverages. Take a multivitamin. Wears his seatbelt.   Exercises at least 3 times a week. Is not a strict vegetarian, but only eats meat 1-2 times a week   Wears a hearing aid. Does  not wear dentures or use an assistive walking device.   There is a smoke detector in his home.   He feels safe in his relationships.   Social Determinants of Health   Financial Resource Strain:   . Difficulty of Paying Living Expenses: Not on file  Food Insecurity:   . Worried About Charity fundraiser in the Last Year: Not on file  . Ran Out of Food in the Last Year: Not on file  Transportation Needs:   . Lack of Transportation (Medical): Not on file  . Lack of Transportation (Non-Medical): Not on file  Physical Activity:   . Days of Exercise per Week: Not on file  . Minutes of Exercise per Session: Not on file  Stress:   . Feeling of Stress : Not on file  Social Connections:   . Frequency of Communication with Friends and Family: Not on file  . Frequency of Social Gatherings with Friends and Family: Not on file  . Attends Religious Services: Not on file  . Active Member of Clubs or Organizations: Not on file  . Attends Archivist Meetings: Not on file  . Marital Status: Not on file  Intimate Partner Violence:   . Fear of Current or Ex-Partner: Not on file  . Emotionally Abused: Not on file  . Physically Abused: Not on file   . Sexually Abused: Not on file   Family History  Problem Relation Age of Onset  . Lung cancer Father        smoker  . Alcohol abuse Father   . Tuberculosis Father   . Heart disease Father   . Early death Father   . Diabetes Other   . Heart disease Mother   . Heart attack Brother   . Stroke Neg Hx   . Thyroid disease Neg Hx   . Colon cancer Neg Hx   . Stomach cancer Neg Hx   . Rectal cancer Neg Hx   . Esophageal cancer Neg Hx   . Inflammatory bowel disease Neg Hx   . Liver disease Neg Hx   . Pancreatic cancer Neg Hx    I have reviewed his medical, social, and family history in detail and updated the electronic medical record as necessary.    PHYSICAL EXAMINATION  BP 128/62   Pulse 63   Ht 5\' 9"  (1.753 m)   Wt 186 lb (84.4 kg)   BMI 27.47 kg/m  Wt Readings from Last 3 Encounters:  05/19/20 186 lb (84.4 kg)  05/10/20 183 lb (83 kg)  04/05/20 178 lb (80.7 kg)  GEN: NAD, appears stated age, doesn't appear chronically ill PSYCH: Cooperative, without pressured speech EYE: Conjunctivae pink, sclerae anicteric ENT: MMM CV: Nontachycardic RESP: No audible wheezing GI: NABS, soft, NT/ND, without rebound or guarding MSK/EXT: No lower extremity edema SKIN: No jaundice NEURO:  Alert & Oriented x 3, no focal deficits   REVIEW OF DATA  I reviewed the following data at the time of this encounter:  GI Procedures and Studies  September 2021 colonoscopy - The examined portion of the ileum was normal. - Localized mild inflammation was found in the cecum. Biopsied. - One 3 mm polyp in the ascending colon, removed with a cold snare. Resected and retrieved. - One 5 mm polyp in the transverse colon, removed with a cold snare. Resected and retrieved. - One diminutive polyp in the transverse colon, removed with a cold biopsy forceps. Resected and retrieved. Pathology Diagnosis 3. Surgical [P], colon,  cecal inflammation - COLONIC MUCOSA WITH NO SPECIFIC HISTOPATHOLOGIC CHANGES.  SEE NOTE - NEGATIVE FOR ACUTE INFLAMMATION, FEATURES OF CHRONICITY, GRANULOMAS OR DYSPLASIA 4. Surgical [P], colon, rectosigmoid, rectal, polyp (8) - TUBULAR ADENOMA(S) WITHOUT HIGH-GRADE DYSPLASIA OR MALIGNANCY - INFLAMMATORY POLYP(S) 5. Surgical [P], colon, distal sigmoid, polyp - TUBULOVILLOUS ADENOMA WITH FOCAL GLANDULAR MISPLACEMENT INTO THE POLYP STALK (PROLAPSE) - NEGATIVE FOR HIGH-GRADE DYSPLASIA OR CARCINOMA - LATERAL MUCOSAL MARGINS ARE NEGATIVE FOR DYSPLASIA. 6. Surgical [P], colon, rectal mass - SUPERFICIAL FRAGMENTS OF TUBULAR ADENOMA(S) WITH HIGH-GRADE DYSPLASIA. SEE NOTE 7. Surgical [P], colon, anal papilla - LOW-GRADE SQUAMOUS INTRAEPITHELIAL LESION (AIN1, LOW GRADE DYSPLASIA)  Laboratory Studies  Reviewed those in epic Negative CEA from last week  Imaging Studies  July 2021 CT abdomen pelvis with contrast IMPRESSION: Sigmoid diverticulosis.  Large stool burden. Aortoiliac atherosclerosis. Prostate enlargement. No acute findings.   ASSESSMENT  Mr. Jeffries is a 74 y.o. male with a pmh significant for cataracts, hypertension chronic back pain, GERD, colon polyps (adenomatous), AIN 1.  The patient is seen today for evaluation and management of:  1. 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 Rectal polyp   3. Abnormal colonoscopy   4. AIN grade I    The patient is clinically and hemodynamically stable.  Based upon the description and endoscopic pictures I do feel that it is reasonable to pursue an Advanced Polypectomy attempt of the polyp/lesion.  I will have endoscopic ultrasound available as well to further evaluate the lesion.  We discussed some of the techniques of advanced polypectomy which include Endoscopic Mucosal Resection, OVESCO Full-Thickness Resection, Endorotor Morcellation, and Tissue Ablation via Fulguration.  The risks and benefits of endoscopic evaluation were discussed with the patient; these include but are not limited to the risk of perforation,  infection, bleeding, missed lesions, lack of diagnosis, severe illness requiring hospitalization, as well as anesthesia and sedation related illnesses.  During attempts at advanced resection, the risks of bleeding and perforation/leak are increased as opposed to diagnostic and screening procedures, and that was discussed with the patient as well.   In addition, I explained that with the possible need for piecemeal resection, subsequent short-interval endoscopic evaluation for follow up and potential retreatment of the lesion/area may be necessary.  I did offer, a referral to surgery in order for patient to have opportunity to discuss surgical management/intervention prior to finalizing decision for attempt at endoscopic removal, however, the patient deferred on this.  If, after attempt at removal of the polyp/lesion, it is found that the patient has a complication or that an invasive lesion or malignant lesion is found, or that the polyp/lesion continues to recur, the patient is aware and understands that surgery may still be indicated/required.  All patient questions were answered, to the best of my ability, and the patient agrees to the aforementioned plan of action with follow-up as indicated.   PLAN  Preprocedure labs to be obtained Proceed with already scheduled colonoscopy with EMR attempt next week with endoscopic ultrasound available if needed Patient will still need colorectal surgery evaluation for AIN 1 hopefully will not need anything for the rectal polyp however we will see   Orders Placed This Encounter  Procedures  . CBC  . Basic Metabolic Panel (BMET)  . INR/PT    New Prescriptions   No medications on file   Modified Medications   No medications on file    Planned Follow Up No follow-ups on file.   Total Time in Face-to-Face and in  Coordination of Care for patient including independent/personal interpretation/review of prior testing, medical history, examination, medication  adjustment, communicating results with the patient directly, and documentation with the EHR is 30 minutes.   Justice Britain, MD [facility]oscopy Office # [phone]

## 2020-05-21 ENCOUNTER — Encounter: Payer: Self-pay | Admitting: Gastroenterology

## 2020-05-21 ENCOUNTER — Other Ambulatory Visit: Payer: Self-pay | Admitting: Family Medicine

## 2020-05-23 NOTE — Progress Notes (Signed)
Unable to reach patient by phone. Left detailed pre-op instructions on his wife's phone number. His number was his work place.   Covid test done on 05/20/20 and it's negative.  EKG - 11/30/19 CXR - 11/30/19 Echo - 02/11/19 Stress - 02/16/19

## 2020-05-24 ENCOUNTER — Ambulatory Visit (HOSPITAL_COMMUNITY): Payer: Medicare Other | Admitting: Anesthesiology

## 2020-05-24 ENCOUNTER — Other Ambulatory Visit: Payer: Self-pay

## 2020-05-24 ENCOUNTER — Encounter (HOSPITAL_COMMUNITY): Payer: Self-pay | Admitting: Gastroenterology

## 2020-05-24 ENCOUNTER — Ambulatory Visit (HOSPITAL_COMMUNITY)
Admission: RE | Admit: 2020-05-24 | Discharge: 2020-05-24 | Disposition: A | Discharge status: Home / Self Care | Payer: Medicare Other | Attending: Gastroenterology | Admitting: Gastroenterology

## 2020-05-24 ENCOUNTER — Encounter (HOSPITAL_COMMUNITY)
Admission: RE | Disposition: A | Discharge status: Home / Self Care | Payer: Self-pay | Source: Home / Self Care | Attending: Gastroenterology

## 2020-05-24 DIAGNOSIS — D123 Benign neoplasm of transverse colon: Secondary | ICD-10-CM | POA: Diagnosis not present

## 2020-05-24 DIAGNOSIS — C20 Malignant neoplasm of rectum: Secondary | ICD-10-CM | POA: Diagnosis not present

## 2020-05-24 DIAGNOSIS — D120 Benign neoplasm of cecum: Secondary | ICD-10-CM | POA: Diagnosis not present

## 2020-05-24 DIAGNOSIS — Z79899 Other long term (current) drug therapy: Secondary | ICD-10-CM | POA: Insufficient documentation

## 2020-05-24 DIAGNOSIS — D490 Neoplasm of unspecified behavior of digestive system: Secondary | ICD-10-CM | POA: Diagnosis not present

## 2020-05-24 DIAGNOSIS — Z888 Allergy status to other drugs, medicaments and biological substances status: Secondary | ICD-10-CM | POA: Insufficient documentation

## 2020-05-24 DIAGNOSIS — K6289 Other specified diseases of anus and rectum: Secondary | ICD-10-CM | POA: Diagnosis present

## 2020-05-24 DIAGNOSIS — K644 Residual hemorrhoidal skin tags: Secondary | ICD-10-CM | POA: Diagnosis not present

## 2020-05-24 DIAGNOSIS — Z885 Allergy status to narcotic agent status: Secondary | ICD-10-CM | POA: Insufficient documentation

## 2020-05-24 DIAGNOSIS — K641 Second degree hemorrhoids: Secondary | ICD-10-CM | POA: Insufficient documentation

## 2020-05-24 DIAGNOSIS — Z86010 Personal history of colonic polyps: Secondary | ICD-10-CM | POA: Diagnosis not present

## 2020-05-24 DIAGNOSIS — K629 Disease of anus and rectum, unspecified: Secondary | ICD-10-CM

## 2020-05-24 DIAGNOSIS — Z87891 Personal history of nicotine dependence: Secondary | ICD-10-CM | POA: Insufficient documentation

## 2020-05-24 DIAGNOSIS — K635 Polyp of colon: Secondary | ICD-10-CM | POA: Diagnosis not present

## 2020-05-24 DIAGNOSIS — K5730 Diverticulosis of large intestine without perforation or abscess without bleeding: Secondary | ICD-10-CM | POA: Insufficient documentation

## 2020-05-24 DIAGNOSIS — D128 Benign neoplasm of rectum: Secondary | ICD-10-CM | POA: Diagnosis not present

## 2020-05-24 DIAGNOSIS — I899 Noninfective disorder of lymphatic vessels and lymph nodes, unspecified: Secondary | ICD-10-CM | POA: Diagnosis not present

## 2020-05-24 HISTORY — PX: BIOPSY: SHX5522

## 2020-05-24 HISTORY — PX: COLONOSCOPY WITH PROPOFOL: SHX5780

## 2020-05-24 HISTORY — PX: EUS: SHX5427

## 2020-05-24 HISTORY — PX: POLYPECTOMY: SHX5525

## 2020-05-24 SURGERY — COLONOSCOPY WITH PROPOFOL
Anesthesia: Monitor Anesthesia Care

## 2020-05-24 MED ORDER — LACTATED RINGERS IV SOLN: INTRAVENOUS | Status: DC | PRN

## 2020-05-24 MED ORDER — LIDOCAINE HCL (CARDIAC) PF 100 MG/5ML IV SOSY
PREFILLED_SYRINGE | INTRAVENOUS | Status: DC | PRN
  Administered 2020-05-24: 100 mg via INTRAVENOUS

## 2020-05-24 MED ORDER — PROPOFOL 10 MG/ML IV BOLUS
INTRAVENOUS | Status: DC | PRN
  Administered 2020-05-24 (×2): 30 mg via INTRAVENOUS
  Administered 2020-05-24: 20 mg via INTRAVENOUS

## 2020-05-24 MED ORDER — SODIUM CHLORIDE 0.9 % IV SOLN: INTRAVENOUS | Status: DC

## 2020-05-24 MED ORDER — PROPOFOL 500 MG/50ML IV EMUL
INTRAVENOUS | Status: DC | PRN
  Administered 2020-05-24: 100 ug/kg/min via INTRAVENOUS

## 2020-05-24 SURGICAL SUPPLY — 22 items

## 2020-05-24 NOTE — Transfer of Care (Signed)
Immediate Anesthesia Transfer of Care Note  Patient: Nathan [name]  Procedure(s) Performed: COLONOSCOPY WITH PROPOFOL (N/A ) LOWER ENDOSCOPIC ULTRASOUND (EUS) (N/A ) POLYPECTOMY BIOPSY  Patient Location: PACU and Endoscopy Unit  Anesthesia Type:MAC  Level of Consciousness: awake, alert  and oriented  Airway & Oxygen Therapy: Patient Spontanous Breathing and Patient connected to nasal cannula oxygen  Post-op Assessment: Report given to RN, Post -op Vital signs reviewed and stable and Patient moving all extremities  Post vital signs: Reviewed and stable  Last Vitals:  Vitals Value Taken Time  BP 174/68 05/24/20 1140  Temp 36.5 C 05/24/20 1132  Pulse 46 05/24/20 1140  Resp 12 05/24/20 1140  SpO2 100 % 05/24/20 1140  Vitals shown include unvalidated device data.  Last Pain:  Vitals:   05/24/20 1132  TempSrc: Oral  PainSc: 0-No pain         Complications: No complications documented.

## 2020-05-24 NOTE — Discharge Instructions (Signed)
YOU HAD AN ENDOSCOPIC PROCEDURE TODAY: Refer to the procedure report and other information in the discharge instructions given to you for any specific questions about what was found during the examination. If this information does not answer your questions, please call [facility] office at [phone] to clarify.   YOU SHOULD EXPECT: Some feelings of bloating in the abdomen. Passage of more gas than usual. Walking can help get rid of the air that was put into your GI tract during the procedure and reduce the bloating. If you had a lower endoscopy (such as a colonoscopy or flexible sigmoidoscopy) you may notice spotting of blood in your stool or on the toilet paper. Some abdominal soreness may be present for a day or two, also.  DIET: Your first meal following the procedure should be a light meal and then it is ok to progress to your normal diet. A half-sandwich or bowl of soup is an example of a good first meal. Heavy or fried foods are harder to digest and may make you feel nauseous or bloated. Drink plenty of fluids but you should avoid alcoholic beverages for 24 hours. If you had a esophageal dilation, please see attached instructions for diet.    ACTIVITY: Your care partner should take you home directly after the procedure. You should plan to take it easy, moving slowly for the rest of the day. You can resume normal activity the day after the procedure however YOU SHOULD NOT DRIVE, use power tools, machinery or perform tasks that involve climbing or major physical exertion for 24 hours (because of the sedation medicines used during the test).   SYMPTOMS TO REPORT IMMEDIATELY: A gastroenterologist can be reached at any hour. Please call [phone]  for any of the following symptoms:  . Following lower endoscopy (colonoscopy, flexible sigmoidoscopy) Excessive amounts of blood in the stool  Significant tenderness, worsening of abdominal pains  Swelling of the abdomen that is new, acute  Fever of 100  or higher  . Following upper endoscopy (EGD, EUS, ERCP, esophageal dilation) Vomiting of blood or coffee ground material  New, significant abdominal pain  New, significant chest pain or pain under the shoulder blades  Painful or persistently difficult swallowing  New shortness of breath  Black, tarry-looking or red, bloody stools  FOLLOW UP:  If any biopsies were taken you will be contacted by phone or by letter within the next 1-3 weeks. Call [phone]  if you have not heard about the biopsies in 3 weeks.  Please also call with any specific questions about appointments or follow up tests.YOU HAD AN ENDOSCOPIC PROCEDURE TODAY: Refer to the procedure report and other information in the discharge instructions given to you for any specific questions about what was found during the examination. If this information does not answer your questions, please call [facility] office at [phone] to clarify.   YOU SHOULD EXPECT: Some feelings of bloating in the abdomen. Passage of more gas than usual. Walking can help get rid of the air that was put into your GI tract during the procedure and reduce the bloating. If you had a lower endoscopy (such as a colonoscopy or flexible sigmoidoscopy) you may notice spotting of blood in your stool or on the toilet paper. Some abdominal soreness may be present for a day or two, also.  DIET: Your first meal following the procedure should be a light meal and then it is ok to progress to your normal diet. A half-sandwich or bowl of soup is an example  of a good first meal. Heavy or fried foods are harder to digest and may make you feel nauseous or bloated. Drink plenty of fluids but you should avoid alcoholic beverages for 24 hours. If you had a esophageal dilation, please see attached instructions for diet.    ACTIVITY: Your care partner should take you home directly after the procedure. You should plan to take it easy, moving slowly for the rest of the day. You can resume  normal activity the day after the procedure however YOU SHOULD NOT DRIVE, use power tools, machinery or perform tasks that involve climbing or major physical exertion for 24 hours (because of the sedation medicines used during the test).   SYMPTOMS TO REPORT IMMEDIATELY: A gastroenterologist can be reached at any hour. Please call [phone]  for any of the following symptoms:  . Following lower endoscopy (colonoscopy, flexible sigmoidoscopy) Excessive amounts of blood in the stool  Significant tenderness, worsening of abdominal pains  Swelling of the abdomen that is new, acute  Fever of 100 or higher   FOLLOW UP:  If any biopsies were taken you will be contacted by phone or by letter within the next 1-3 weeks. Call [phone]  if you have not heard about the biopsies in 3 weeks.  Please also call with any specific questions about appointments or follow up tests.   Monitored Anesthesia Care, Care After These instructions provide you with information about caring for yourself after your procedure. Your health care provider may also give you more specific instructions. Your treatment has been planned according to current medical practices, but problems sometimes occur. Call your health care provider if you have any problems or questions after your procedure. What can I expect after the procedure? After your procedure, you may:  Feel sleepy for several hours.  Feel clumsy and have poor balance for several hours.  Feel forgetful about what happened after the procedure.  Have poor judgment for several hours.  Feel nauseous or vomit.  Have a sore throat if you had a breathing tube during the procedure. Follow these instructions at home: For at least 24 hours after the procedure:      Have a responsible adult stay with you. It is important to have someone help care for you until you are awake and alert.  Rest as needed.  Do not: ? Participate in activities in which you could  fall or become injured. ? Drive. ? Use heavy machinery. ? Drink alcohol. ? Take sleeping pills or medicines that cause drowsiness. ? Make important decisions or sign legal documents. ? Take care of children on your own. Eating and drinking  Follow the diet that is recommended by your health care provider.  If you vomit, drink water, juice, or soup when you can drink without vomiting.  Make sure you have little or no nausea before eating solid foods. General instructions  Take over-the-counter and prescription medicines only as told by your health care provider.  If you have sleep apnea, surgery and certain medicines can increase your risk for breathing problems. Follow instructions from your health care provider about wearing your sleep device: ? Anytime you are sleeping, including during daytime naps. ? While taking prescription pain medicines, sleeping medicines, or medicines that make you drowsy.  If you smoke, do not smoke without supervision.  Keep all follow-up visits as told by your health care provider. This is important. Contact a health care provider if:  You keep feeling nauseous or you keep vomiting.  You feel light-headed.  You develop a rash.  You have a fever. Get help right away if:  You have trouble breathing. Summary  For several hours after your procedure, you may feel sleepy and have poor judgment.  Have a responsible adult stay with you for at least 24 hours or until you are awake and alert. This information is not intended to replace advice given to you by your health care provider. Make sure you discuss any questions you have with your health care provider. Document Revised: 09/29/2017 Document Reviewed: 10/22/2015 Elsevier Patient Education  [place].

## 2020-05-24 NOTE — Op Note (Signed)
[facility] Patient Name: Nathan [name] Procedure Date : 05/24/2020 MRN: [id number] Attending MD: Justice Britain , MD Date of Birth: 1945-08-22 CSN: [id number] Age: 74 Admit Type: Inpatient Procedure:                Lower EUS Indications:              Rectal mucosal lesion - TVA with HGD found on flex                            sig/colonoscopy here for attempt at resection Providers:                Justice Britain, MD, Vista Lawman, RN, Ladona Ridgel, Technician, Raphael Gibney, CRNA Referring MD:             Carlota Raspberry. Havery Moros, MD, Ma Hillock Medicines:                Monitored Anesthesia Care Complications:            No immediate complications. Estimated Blood Loss:     Estimated blood loss was minimal. Procedure:                Pre-Anesthesia Assessment:                           - Prior to the procedure, a History and Physical                            was performed, and patient medications and                            allergies were reviewed. The patient's tolerance of                            previous anesthesia was also reviewed. The risks                            and benefits of the procedure and the sedation                            options and risks were discussed with the patient.                            All questions were answered, and informed consent                            was obtained. Prior Anticoagulants: The patient has                            taken no previous anticoagulant or antiplatelet                            agents. ASA Grade Assessment: III - A patient with  severe systemic disease. After reviewing the risks                            and benefits, the patient was deemed in                            satisfactory condition to undergo the procedure.                           After obtaining informed consent, the endoscope was                             passed under direct vision. Throughout the                            procedure, the patient's blood pressure, pulse, and                            oxygen saturations were monitored continuously. The                            CF-HQ190L ([id number]) Olympus colonoscope was                            introduced through the anus and advanced to the 5                            cm into the ileum. The [id number]-[id number] ([id number]) Olympus                            gastroscope was introduced through the anus and                            advanced to the the sigmoid colon. The [id number]-[id number]                            ([id number]) Olympus Radial EUS scope was introduced                            through the anus and advanced to the the sigmoid                            colon for ultrasound. We did have issues with                            saving some EUS imaging to [employer] so there are                            some images that are loaded into [employer] Tab The                            lower EUS was accomplished without difficulty. The  patient tolerated the procedure. Scope In: 10:05:59 AM Scope Out: 11:21:48 AM Scope Withdrawal Time: 1 hour 10 minutes 51 seconds  Total Procedure Duration: 1 hour 15 minutes 49 seconds  Findings:      ENDOSCOPIC FINDING: :      DRE suggested a lesion in the posterior wall upon very deep insertion of       digit.      The terminal ileum and ileocecal valve appeared normal.      Three sessile polyps were found in the recto-sigmoid colon , ascending       colon and cecum. The polyps were 3 to 7 mm in size. These polyps were       removed with a cold snare. Resection and retrieval were complete.      A small scar was found in the sigmoid colon next to tattoo - from       previous pedunculated polypectomy EMR. There was no evidence of the       previous polyp. The region was sampled with a cold snare.      An at least 35 mm polypoid lesion was  found in the upper middle/proximal       rectum (9-12 cm from anal canal). The lesion was semi-sessile. Area of       recent oozing was present though no overt blood was noted upon the       examination. The lesion is characterized as a JNET2-JNET3 and there are       significant portions within the middle of the lesion that have loss of       pit-pattern. We proceeded with EUS as below.      Multiple small and large-mouthed diverticula were found in the entire       colon.      External and internal hemorrhoids were found during retroflexion, during       perianal exam and during digital exam. The hemorrhoids were Grade II       (internal hemorrhoids that prolapse but reduce spontaneously).      ENDOSONOGRAPHIC FINDING: :      A lobulated lesion was found in the rectum. The lesion was encountered       from 9 to 12 cm (from the anal verge). The lesion was partially       circumferential (involving 30-40% of the lumen). The lesion was       hypoechoic. Sonographically, the origin appeared to be within the       luminal interface/superficial mucosa (Layer 1), deep mucosa (Layer 2)       and submucosa (Layer 3). No additional wall layers were involved. The       wall measured up to 14 mm in thickness. The endosonographic borders were       irregular. There was sonographic evidence suggesting parenchymal       invasion into the luminal interface/superficial mucosa (Layer 1), deep       mucosa (Layer 2) and submucosa (Layer 3). An intact interface was seen       between the lesion and the muscularis propria (Layer 4) suggesting a       lack of invasion. Biopsies were taken with a cold forceps for histology.      The perirectal space was normal.      No malignant-appearing lymph nodes were visualized in the perirectal       region and in the left iliac region. The nodes were.  The internal anal sphincter was visualized endosonographically and       appeared normal. Impression:                EGD Impression:                           - DRE with rectal nodularity felt at distal tip of                            digit.                           - The examined portion of the ileum was normal.                           - Three 3 to 7 mm polyps at the recto-sigmoid                            colon, in the ascending colon and in the cecum,                            removed with a cold snare. Resected and retrieved.                           - Diverticulosis in the entire examined colon.                           - Scar in the sigmoid colon - sampled to exclude                            remaining adenoma.                           - Rule out malignancy, polypoid lesion in the                            proximal/middle rectum (9-12 cm). Biopsied below as                            per EUS.                           - External and internal hemorrhoids.                           EUS Impression:                           - An polypoid mass was visualized                            endosonographically in the rectum. The origin of                            the lesion appeared to be within the luminal  interface/superficial mucosa (Layer 1), deep mucosa                            (Layer 2) and submucosa (Layer 3). There is no                            involvement of the muscularis propria on today's                            examination however. I have concern that this may                            already be a transformed polyp into an                            adenocarcinoma. Biopsied to further define next                            steps in evaluation/treatment.                           - Endosonographic images of the perirectal space                            were unremarkable.                           - No malignant-appearing lymph nodes were                            visualized endosonographically in the perirectal                             region and in the left iliac region.                           - The internal anal sphincter was visualized                            endosonographically and appeared normal. Recommendation:           - The patient will be observed post-procedure,                            until all discharge criteria are met.                           - Discharge patient to home.                           - Patient has a contact number available for                            emergencies. The signs and symptoms of potential  delayed complications were discussed with the                            patient. Return to normal activities tomorrow.                            Written discharge instructions were provided to the                            patient.                           - Resume previous diet.                           - Await path results.                           - Further discussions based on final pathology                            though I am concerned that this may already be                            malignant.                           - If malignant will need updated CT-CAP and a                            Pelvic MRI. Would likely then need Surgical                            evaluation. If lesion demonstrated is not overtly                            malignant, then consideration of whether ESD could                            be considered may be had, though patient is healthy                            and overall life expectancy is such that surgery                            may still be recommended for final delineation.                           - The findings and recommendations were discussed                            with the patient.                           - The findings and recommendations were discussed  with the patient's family. Procedure Code(s):        --- Professional ---                           [phone],  Colonoscopy, flexible; with endoscopic                            ultrasound examination limited to the rectum,                            sigmoid, descending, transverse, or ascending colon                            and cecum, and adjacent structures                           45385, Colonoscopy, flexible; with removal of                            tumor(s), polyp(s), or other lesion(s) by snare                            technique                           45380, 59, Colonoscopy, flexible; with biopsy,                            single or multiple Diagnosis Code(s):        --- Professional ---                           K64.1, Second degree hemorrhoids                           K63.5, Polyp of colon                           K63.89, Other specified diseases of intestine                           D49.0, Neoplasm of unspecified behavior of                            digestive system                           K62.89, Other specified diseases of anus and rectum                           I89.9, Noninfective disorder of lymphatic vessels                            and lymph nodes, unspecified                           K57.30, Diverticulosis of large intestine without  perforation or abscess without bleeding CPT copyright 2019 [employer]. All rights reserved. The codes documented in this report are preliminary and upon coder review may  be revised to meet current compliance requirements. Justice Britain, MD 05/24/2020 12:06:47 PM Number of Addenda: 0

## 2020-05-24 NOTE — Anesthesia Preprocedure Evaluation (Addendum)
Anesthesia Evaluation  Patient identified by MRN, date of birth, ID band Patient awake    Reviewed: Allergy & Precautions, NPO status , Patient's Chart, lab work & pertinent test results  History of Anesthesia Complications Negative for: history of anesthetic complications (episode of hypotension, hypoglycemia in past)  Airway Mallampati: I  TM Distance: >3 FB Neck ROM: Full    Dental no notable dental hx. (+) Teeth Intact, Dental Advisory Given   Pulmonary former smoker,    Pulmonary exam normal breath sounds clear to auscultation       Cardiovascular hypertension, Pt. on medications Normal cardiovascular exam Rhythm:Regular Rate:Normal  Echo 2020: 1. The left ventricle has normal systolic function, with an ejection  fraction of 60-65%. The cavity size was normal. There is mildly increased  left ventricular wall thickness. Left ventricular diastolic parameters  were normal.  2. The right ventricle has normal systolc function. The cavity was mildly  enlarged. There is no increase in right ventricular wall thickness. Right  ventricular systolic pressure is normal with an estimated pressure of 28.0  mmHg.  3. Aortic valve regurgitation was not assessed by color flow Doppler.  4. Pulmonic valve regurgitation was not assessed by color flow Doppler.    Neuro/Psych PSYCHIATRIC DISORDERS Anxiety Depression negative neurological ROS     GI/Hepatic Neg liver ROS, GERD  Medicated and Controlled,Rectal lesion   Endo/Other    Renal/GU negative Renal ROS  negative genitourinary   Musculoskeletal  (+) Arthritis , Osteoarthritis,    Abdominal Normal abdominal exam  (+)   Peds  Hematology negative hematology ROS (+)   Anesthesia Other Findings   Reproductive/Obstetrics negative OB ROS                            Anesthesia Physical Anesthesia Plan  ASA: II  Anesthesia Plan: MAC   Post-op  Pain Management:    Induction:   PONV Risk Score and Plan: Propofol infusion, TIVA and Treatment may vary due to age or medical condition  Airway Management Planned: Natural Airway and Nasal Cannula  Additional Equipment: None  Intra-op Plan:   Post-operative Plan:   Informed Consent: I have reviewed the patients History and Physical, chart, labs and discussed the procedure including the risks, benefits and alternatives for the proposed anesthesia with the patient or authorized representative who has indicated his/her understanding and acceptance.       Plan Discussed with: CRNA  Anesthesia Plan Comments:        Anesthesia Quick Evaluation

## 2020-05-24 NOTE — Interval H&P Note (Signed)
History and Physical Interval Note:  05/24/2020 9:32 AM  Nathan [name]  has presented today for surgery, with the diagnosis of rectal lesion.  The various methods of treatment have been discussed with the patient and family. After consideration of risks, benefits and other options for treatment, the patient has consented to  Procedure(s): COLONOSCOPY WITH PROPOFOL (N/A) LOWER ENDOSCOPIC ULTRASOUND (EUS) (N/A) ENDOSCOPIC MUCOSAL RESECTION (N/A) as a surgical intervention.  The patient's history has been reviewed, patient examined, no change in status, stable for surgery.  I have reviewed the patient's chart and labs.  Questions were answered to the patient's satisfaction.     [employer]

## 2020-05-24 NOTE — Anesthesia Postprocedure Evaluation (Signed)
Anesthesia Post Note  Patient: ISRRAEL FLUCKIGER  Procedure(s) Performed: COLONOSCOPY WITH PROPOFOL (N/A ) LOWER ENDOSCOPIC ULTRASOUND (EUS) (N/A ) POLYPECTOMY BIOPSY     Patient location during evaluation: PACU Anesthesia Type: MAC Level of consciousness: awake and alert Pain management: pain level controlled Vital Signs Assessment: post-procedure vital signs reviewed and stable Respiratory status: spontaneous breathing, nonlabored ventilation and respiratory function stable Cardiovascular status: blood pressure returned to baseline and stable Postop Assessment: no apparent nausea or vomiting Anesthetic complications: no   No complications documented.  Last Vitals:  Vitals:   05/24/20 1150 05/24/20 1200  BP: (!) 175/71 (!) 183/73  Pulse: (!) 47 (!) 49  Resp: 15 12  Temp:    SpO2: 100% 100%    Last Pain:  Vitals:   05/24/20 1200  TempSrc:   PainSc: 0-No pain                 Pervis Hocking

## 2020-05-25 LAB — SURGICAL PATHOLOGY

## 2020-05-26 ENCOUNTER — Telehealth: Payer: Self-pay | Admitting: Gastroenterology

## 2020-05-26 ENCOUNTER — Other Ambulatory Visit: Payer: Self-pay

## 2020-05-26 DIAGNOSIS — C20 Malignant neoplasm of rectum: Secondary | ICD-10-CM

## 2020-05-26 NOTE — Telephone Encounter (Signed)
Pt returning your call

## 2020-05-26 NOTE — Telephone Encounter (Signed)
Hi Dr. Rush Landmark, pt's son Thurmond Butts returned your call.. Pls call him again when you have a chance. Thank you.

## 2020-05-28 ENCOUNTER — Encounter (HOSPITAL_COMMUNITY): Payer: Self-pay | Admitting: Gastroenterology

## 2020-05-28 NOTE — Telephone Encounter (Signed)
I was able to connect with the patient on 11/12 PM. We went over the results and next steps in his father's management. Son appreciative for the callback and discussion. No further issues at this time.  Justice Britain, MD [facility] Office # [phone]

## 2020-05-29 ENCOUNTER — Encounter: Payer: Self-pay | Admitting: Gastroenterology

## 2020-05-29 NOTE — Progress Notes (Signed)
Spoke with patient regarding referral we received from Dr. Rush Landmark.  I have given him an appointment with Dr. Julieanne Manson for Monday 11/19 at 2 pm to arrive by 1:45.  This will be after his CT and MRI is completed and we will have those results.  He understands he can bring one person with him to this consult.  Also that [place] parking is available.  He is aware of our physical location.

## 2020-05-30 ENCOUNTER — Telehealth: Payer: Self-pay | Admitting: Gastroenterology

## 2020-05-30 DIAGNOSIS — C20 Malignant neoplasm of rectum: Secondary | ICD-10-CM

## 2020-05-30 NOTE — Telephone Encounter (Signed)
He order has been entered as requested.

## 2020-05-30 NOTE — Telephone Encounter (Signed)
Ginger from [facility] Scheduling received a message from a tech to correct the MRI order to MRI W and WO Contrast instead, new order needs to be placed.

## 2020-05-31 ENCOUNTER — Ambulatory Visit (HOSPITAL_COMMUNITY)
Admission: RE | Admit: 2020-05-31 | Discharge: 2020-05-31 | Disposition: A | Discharge status: Home / Self Care | Payer: Medicare Other | Source: Ambulatory Visit | Attending: Gastroenterology | Admitting: Gastroenterology

## 2020-05-31 ENCOUNTER — Other Ambulatory Visit: Payer: Self-pay

## 2020-05-31 ENCOUNTER — Other Ambulatory Visit: Payer: Self-pay | Admitting: Gastroenterology

## 2020-05-31 DIAGNOSIS — C20 Malignant neoplasm of rectum: Secondary | ICD-10-CM | POA: Insufficient documentation

## 2020-05-31 DIAGNOSIS — D492 Neoplasm of unspecified behavior of bone, soft tissue, and skin: Secondary | ICD-10-CM | POA: Diagnosis not present

## 2020-05-31 DIAGNOSIS — L989 Disorder of the skin and subcutaneous tissue, unspecified: Secondary | ICD-10-CM | POA: Diagnosis not present

## 2020-06-06 ENCOUNTER — Encounter (HOSPITAL_COMMUNITY): Payer: Self-pay

## 2020-06-06 ENCOUNTER — Ambulatory Visit (HOSPITAL_COMMUNITY)
Admission: RE | Admit: 2020-06-06 | Discharge: 2020-06-06 | Disposition: A | Discharge status: Home / Self Care | Payer: Medicare Other | Source: Ambulatory Visit | Attending: Gastroenterology | Admitting: Gastroenterology

## 2020-06-06 ENCOUNTER — Other Ambulatory Visit: Payer: Self-pay

## 2020-06-06 DIAGNOSIS — C20 Malignant neoplasm of rectum: Secondary | ICD-10-CM

## 2020-06-06 DIAGNOSIS — J439 Emphysema, unspecified: Secondary | ICD-10-CM | POA: Diagnosis not present

## 2020-06-06 DIAGNOSIS — C19 Malignant neoplasm of rectosigmoid junction: Secondary | ICD-10-CM | POA: Diagnosis not present

## 2020-06-06 DIAGNOSIS — I2510 Atherosclerotic heart disease of native coronary artery without angina pectoris: Secondary | ICD-10-CM | POA: Diagnosis not present

## 2020-06-06 DIAGNOSIS — I700 Atherosclerosis of aorta: Secondary | ICD-10-CM | POA: Diagnosis not present

## 2020-06-06 DIAGNOSIS — K5730 Diverticulosis of large intestine without perforation or abscess without bleeding: Secondary | ICD-10-CM | POA: Diagnosis not present

## 2020-06-06 MED ORDER — IOHEXOL 300 MG/ML  SOLN
100.0000 mL | Freq: Once | INTRAMUSCULAR | Status: AC | PRN
  Administered 2020-06-06: 100 mL via INTRAVENOUS

## 2020-06-12 ENCOUNTER — Telehealth: Payer: Self-pay | Admitting: Oncology

## 2020-06-12 ENCOUNTER — Inpatient Hospital Stay: Payer: Medicare Other | Attending: Oncology | Admitting: Oncology

## 2020-06-12 ENCOUNTER — Other Ambulatory Visit: Payer: Self-pay

## 2020-06-12 VITALS — BP 141/78 | HR 71 | Temp 97.9°F | Resp 16 | Ht 70.0 in | Wt 183.8 lb

## 2020-06-12 DIAGNOSIS — C20 Malignant neoplasm of rectum: Secondary | ICD-10-CM

## 2020-06-12 NOTE — Telephone Encounter (Signed)
Scheduled per los. Patient declined printout  

## 2020-06-12 NOTE — Progress Notes (Signed)
Nathan [name]   Requesting MD: [address] [place],  [place] [address]   Nathan [name] 74 y.o.  07-02-1946    Reason for [name]: Rectal cancer   HPI: [name] saw his primary provider for evaluation of fatigue.  He reports being diagnosed with a thyroid abnormality.  He was noted to have mild anemia with a Hemoccult positive stool.  He was referred to Dr. Havery Moros and was taken to an upper endoscopy/colonoscopy on 04/05/2020.  Polyps were removed from the ascending, transverse, sigmoid, and rectum.  A large broad-based polypoid lesion was found in the proximal rectum at 10 cm from the anal verge.  Biopsies were obtained and the area was tattooed.  Multiple diverticula were found in the colon. The pathology from the GE junction revealed Barrett's esophagus.  Tubular adenomas at the rectosigmoid, sigmoid, and rectal polyps.  The rectal mass returned as a tubular adenoma with high-grade dysplasia.  A biopsy from the anus revealed a low-grade squamous intraepithelial lesion.  He was referred to Dr. Rush Landmark for a lower EUS on 05/24/2020.  Polyps were removed from the rectosigmoid, ascending colon, and cecum.  A small scar was found in the sigmoid colon adjacent to a tattoo from a previous pedunculated polypectomy.  This area was biopsied.  A 35 mm polypoid lesion was noted in the upper middle/proximal rectum at 9-12 cm from the anal canal.  On ultrasound the lesion was partially circumferential and involve the superficial mucosa, deep mucosa, and submucosa.  There was lack of invasion of the muscularis propria.  Biopsies were obtained.  The perirectal space appeared normal.  No malignant appearing lymph nodes were identified. The pathology from the colon polypectomies returned as a tubular adenoma.  The sigmoid polypectomy revealed colonic mucosa with an underlying lymphoid aggregate.  The biopsy from the rectum revealed superficial fragments of  a tubular adenoma with marked high-grade dysplasia and focal intramucosal adenocarcinoma.  Nathan [name] was referred for CTs of the chest, abdomen, and pelvis on 06/06/2020.  A 1.9 x 1.5 cm area of nodular thickening was noted in the rectum.  Sigmoid diverticulosis.  No adenopathy.  No abnormal perirectal lymph nodes.  Mildly enlarged prostate. An MRI of the pelvis on 05/31/2020 revealed a tumor at 13 cm from the anal verge the lesion was felt to be at or above the anterior peritoneal reflection.  The tumor measured 10 cm from the internal anal sphincter.  No tumor extension through the muscularis propria.  A lymph node measured 5-6 mm approximately 2 mm from the upper margin of the mesorectum.  An accurate T stage could not be determined.  The tumor was staged as an N1 lesion.    Past Medical History:  Diagnosis Date  . Adrenal mass ([place]) 2018   Urology fully evaluated; benign  . Allergy    hay fever  . Anxiety   . Cataract   . Cervicalgia   . Complication of anesthesia    hypoglycemia, low blood pressure, pt. reports it dropped to zero- post op MORPHINE , then taken ICU due to either the morphine or hypoglycemia . In addition post op from cataract surgery- 07/2015, experienced an episode of hypoglycemia    . Depressive disorder, not elsewhere classified   . Diverticulosis   . Elevated prostate specific antigen (PSA) 2008   Dr Jeffie Pollock  . Gallstone   . H/O cardiovascular stress test >10 yrs.   told that its wnl.  Told to drink less coffee  . Hematuria 2018   Hematuria with enlarged prostate, kidney cyst, adrenal adenoma--> followed by urology with cystoscopy and repeat CT, benign workup.  [name] Hx of migraines   . Hypertension   . Hypoglycemia   . IBS (irritable bowel syndrome)   . Inguinal hernia 12/2015   Bilateral small indirect hernias on CT, umbilical hernia also present.  . Kidney cyst, acquired 2018   Followed by urology, benign.  . Low back pain   . Macular degeneration (senile) of  retina, unspecified    2 different opinions from 2 different Ophth  . Osteoarthrosis, unspecified whether generalized or localized, unspecified site   . Other and unspecified hyperlipidemia   . Prostatitis 2008  . Refusal of blood transfusions as patient is Jehovah's Witness   . Segmental and somatic dysfunction of cervical region   . Segmental and somatic dysfunction of lumbar region   . Segmental and somatic dysfunction of sacral region   . Segmental and somatic dysfunction of thoracic region   . Spondylosis of lumbar spine   . Strain of pelvis   . 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    Past Surgical History:  Procedure Laterality Date  . BIOPSY  05/24/2020   Procedure: BIOPSY;  Surgeon: Rush Landmark Telford Nab., MD;  Location: [facility];  Service: Gastroenterology;;  . CATARACT EXTRACTION, BILATERAL    . COLONOSCOPY  2012   Dr Olevia Perches  . COLONOSCOPY WITH PROPOFOL N/A 05/24/2020   Procedure: COLONOSCOPY WITH PROPOFOL;  Surgeon: Rush Landmark Telford Nab., MD;  Location: [facility];  Service: Gastroenterology;  Laterality: N/A;  . CYSTOSCOPY     for hematuria  . EUS N/A 05/24/2020   Procedure: LOWER ENDOSCOPIC ULTRASOUND (EUS);  Surgeon: Irving Copas., MD;  Location: [facility];  Service: Gastroenterology;  Laterality: N/A;  . hydrocoelectomy  2003   Dr Jeffie Pollock  . KNEE ARTHROSCOPY Left 1994  . POLYPECTOMY  05/24/2020   Procedure: POLYPECTOMY;  Surgeon: Mansouraty, Telford Nab., MD;  Location[facility];  Service: Gastroenterology;;  . prostate nodule resection  2003  . TONSILLECTOMY    . TOTAL KNEE ARTHROPLASTY  2009  . TOTAL KNEE ARTHROPLASTY Right 06/27/2016   Procedure: TOTAL KNEE ARTHROPLASTY;  Surgeon: Leandrew Koyanagi, MD;  Location: [facility];  Service: Orthopedics;  Laterality: Right;    Medications: Reviewed  Allergies:  Allergies  Allergen Reactions  . Morphine And Related Other (See Comments)    Severe drop in blood  pressure  . Citalopram Hydrobromide Other (See Comments)    PATIENT PREFERENCE: "Just ineffective"  . Sertraline Hcl Other (See Comments)    Headache    Family history: His son was diagnosed with colon cancer in his 19s.  His father had "cancer ".  Social History: He lives with his wife in [place].  He works in Civil engineer, contracting.  He does not use cigarettes.  Rare alcohol use.  No transfusion history.  No risk factor for HIV or hepatitis.   ROS:   Positives include: Intentional 25 pound weight loss with weight watcher, recent fall injuring the right knee  A complete ROS was otherwise negative.  Physical Exam:  Blood pressure (!) 141/78, pulse 71, temperature 97.9 F (36.6 C), temperature source Tympanic, resp. rate 16, height _0  (1.778 m), weight 183 lb 12.8 oz (83.4 kg), SpO2 99 %.  HEENT: Neck without mass Lungs: Clear bilaterally Cardiac: Regular rate and rhythm Abdomen: No hepatosplenomegaly,  no mass, nontender  Vascular: No leg edema Lymph nodes: No cervical, supraclavicular, axillary, or inguinal nodes Neurologic: Alert and oriented, the motor exam appears intact in the upper and lower extremities bilaterally Skin: No rash Musculoskeletal: No spine tenderness   LAB:  CBC  Lab Results  Component Value Date   WBC 7.5 05/19/2020   HGB 13.4 05/19/2020   HCT 40.3 05/19/2020   MCV 88.4 05/19/2020   PLT 265.0 05/19/2020   NEUTROABS 4.9 01/28/2020        CMP  Lab Results  Component Value Date   NA 139 05/19/2020   K 4.1 05/19/2020   CL 105 05/19/2020   CO2 27 05/19/2020   GLUCOSE 97 05/19/2020   BUN 15 05/19/2020   CREATININE 1.06 05/19/2020   CALCIUM 8.6 05/19/2020   PROT 6.0 01/28/2020   ALBUMIN 3.6 01/28/2020   AST 15 01/28/2020   ALT 18 01/28/2020   ALKPHOS 84 01/28/2020   BILITOT 0.4 01/28/2020   GFRNONAA >60 11/30/2019   GFRAA >60 11/30/2019   05/17/2020: CEA-0.6    Imaging:  As per HPI-CT images from 06/06/2020  reviewed with Nathan [name] and his wife   Assessment/Plan:   1. Rectal cancer  Colonoscopy 04/05/2020-proximal rectal mass at 10 cm from anal verge, biopsy-superficial fragments of tubular adenoma with high-grade dysplasia  EUS 05/24/2020-lobulated mass at 9-12 cm from the anal verge, involvement of superficial, deep, and submucosa.  No invasion of the muscularis.  No malignant appearing lymph nodes in the perirectal region and left iliac region.  Biopsy-superficial fragments of tubular adenoma with marked high-grade dysplasia and focal intramucosal adenocarcinoma  Pelvic MRI 05/31/2020-T stage difficult to obtain, no extra rectal extension, 5-6 mm lymph node approximately 2 mm from the posterior margin of the mesorectum-in 1.  Tumor appears to be located just at or above the anterior peritoneal reflection without extension beyond the muscularis  CTs 06/06/2020-ill-defined area of nodular thickening in the rectum, no evidence of metastatic disease.  Sigmoid diverticulosis.  Stable right adrenal hypodense lesion. 2. Multiple colon/rectal polyps removed on colonoscopy 04/05/2020 and EUS 05/24/2020 3. Barrett's esophagus 4. 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 Mild anemia 6. Hypertension 7. Family history of colon cancer-son   Disposition:   Nathan [name] has been diagnosed with rectal cancer.  The tumor lies in the high mid/proximal rectum.  He appears to have early stage disease based on the staging evaluation to date.  The lesion appears to have an early T stage without evidence of muscularis invasion on the staging EUS.  No perirectal lymphadenopathy was seen on the staging CTs or EUS.  A single 5-6 mm node was confirmed on the pelvic MRI.  I discussed the treatment of rectal cancer with Nathan [name] and his wife.  I will present his case at the multidisciplinary GI tumor conference on 06/14/2020.  My initial impression is to recommend upfront surgery as he may be proven to have early stage  disease and not require adjuvant chemotherapy or radiation.  He is scheduled to see Dr. Johney Maine on 06/19/2020.  We will contact Nathan [name] with the cancer conference treatment recommendation.  I will plan to see him after surgery or sooner depending on the treatment recommendation.  He most likely does not have hereditary nonpolyposis colon cancer syndrome, but his family members are at increased risk for developing colorectal cancer and should receive appropriate screening.  We will request mismatch repair protein testing on the rectal biopsy.  Betsy Coder, MD  06/12/2020, 4:04 PM

## 2020-06-12 NOTE — Progress Notes (Signed)
Met with patient and his wife [name] prior to his initial medical oncology consult with Dr. Julieanne Manson.  I explained my role as nurse navigator.  They were given my card with my direct contact information.  He verbalized an understanding of the plan is to present his case at our GI conference this Wed 12/1, have his surgical consult with Dr. Johney Maine on 12/6 and most likely have upfront surgery and then follow back up with Dr. Benay Spice a couple weeks post surgery.

## 2020-06-14 ENCOUNTER — Other Ambulatory Visit: Payer: Self-pay

## 2020-06-14 ENCOUNTER — Telehealth: Payer: Self-pay

## 2020-06-14 NOTE — Telephone Encounter (Signed)
Spoke with patient to let him know we discussed his case in Tumor Board this morning and it is felt this is an early stage rectal cancer.  The recommendation is upfront surgery.  He sees Dr. Johney Maine for surgical consult on Monday 12/6.  Patient verbalized an understanding.

## 2020-06-19 ENCOUNTER — Encounter: Payer: Self-pay | Admitting: Surgery

## 2020-06-19 ENCOUNTER — Ambulatory Visit: Payer: Self-pay | Admitting: Surgery

## 2020-06-19 DIAGNOSIS — C20 Malignant neoplasm of rectum: Secondary | ICD-10-CM | POA: Diagnosis not present

## 2020-06-19 DIAGNOSIS — R55 Syncope and collapse: Secondary | ICD-10-CM | POA: Diagnosis not present

## 2020-06-19 DIAGNOSIS — Z531 Procedure and treatment not carried out because of patient's decision for reasons of belief and group pressure: Secondary | ICD-10-CM | POA: Diagnosis not present

## 2020-06-19 DIAGNOSIS — IMO0001 Reserved for inherently not codable concepts without codable children: Secondary | ICD-10-CM | POA: Insufficient documentation

## 2020-06-19 NOTE — H&P (View-Only) (Signed)
[name] Appointment: 06/19/2020 11:30 AM Location: [facility] Patient #: [id number] DOB: 11-03-1945 Married / Language: [name] / Race: White Male  History of Present Illness Nathan Hector MD; 06/19/2020 12:25 PM) The patient is a 74 year old male who presents with colorectal cancer. Note for "Colorectal cancer": ` ` ` Patient sent for surgical consultation at the request of Dr Havery Moros, [facility] gastroenterology  Chief Complaint: Proximal rectal mass with probable focus of adenocarcinoma ` ` The patient is a pleasant gentleman that I had seen a few years ago her some and all complaints of resolved. Had underwhelming colonoscopy in 2012. Had follow-up screening colonoscopy 2021. Numerous adenomas removed. Larger polypoid mass noted in proximal rectum involving 1/3 of the circumference - unable be fully excised due to central dithering back showing at least high-grade dysplasia. Endoscopic ultrasound done with some concern of invasion and biopsy showing high-grade dysplasia with focus of adenocarcinoma. I hard to stage but suspect T1 at the most. Discussed that GI tumor Board. Because this seemed rather proximal, it was felt that surgery first may be appropriate and no need for neoadjuvant chemotherapy radiation therapy. Consultations with medical oncology and surgery requested.  Patient comes today with his wife. Usually moves his bowels every morning. Denies any episodes of bleeding. He walk a half hour without too much difficulty. Does have some right knee soreness. He did have some episodes of lightheadedness dizziness and near syncope. Had a rather extensive cardiac workup last year and to show no evidence of heart failure and dysrhythmia. Seen by Dr. Bettina Gavia. Patient had a TURP for some prostatism and 2001. He is there any abdominal surgery. Does not smoke. Not diabetic. No sleep apnea.  No personal nor family history of inflammatory bowel disease,  irritable bowel syndrome, allergy such as Celiac Sprue, dietary/dairy problems, colitis, ulcers nor gastritis. No recent sick contacts/gastroenteritis. No travel outside the country. No changes in diet. No dysphagia to solids or liquids. No significant heartburn or reflux. No melena, hematemesis, coffee ground emesis. No evidence of prior gastric/peptic ulceration.  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   (Review of systems as stated in this history (HPI) or in the review of systems. Otherwise all other 12 point ROS are negative)   SURGICAL PATHOLOGY CASE: [id number] PATIENT: Nathan [name] Surgical Pathology Report     Clinical History: Rectal lesion; hemorrhoids, diverticulosis; r/o adenoma and malignancy (nt)     FINAL MICROSCOPIC DIAGNOSIS:  A. COLON, POLYPECTOMY: - Tubular adenoma. - Negative for high grade dysplasia.  B. COLON, SIGMOID, POLYPECTOMY: - Colonic mucosa with underlying lymphoid aggregate. - Negative for dysplasia.  C. RECTUM, BIOPSY: - Superficial fragments of tubular adenoma with marked high grade dysplasia and focal intramucosal adenocarcinoma; see comment.  COMMENT:  C. A deeper/adjacent more serious process cannot be entirely excluded on this material alone. Endoscopic correlation is suggested. Dr. Vic Ripper reviewed.  [name] DESCRIPTION:  Specimen A: Received in formalin is a 1.6 x 1.2 x 0.25 cm aggregate of tan-red soft tissue fragments, submitted in 1 block.  Specimen B: Received in formalin is a 1.2 x 0.2 x 0.15 cm of pink-red soft tissue fragments, submitted in 1 block.  Specimen C: Received in formalin is a 1 x 0.7 x 0.2 cm aggregate of tan-pink soft tissue fragments, submitted in 1 block.  SW 05/24/2020    Final Diagnosis performed by [name], MD. Electronically signed 05/25/2020 Technical and / or Professional components performed at [facility].  [facility],  [place] [address] [place], [place] [address]. Immunohistochemistry Technical component (if applicable) was performed at [facility]. [address] [place], [place],  [address]. IMMUNOHISTOCHEMISTRY DISCLAIMER (if applicable): Some of these immunohistochemical stains may have been developed and the performance characteristics determine by [facility]. Some may not have been cleared or approved by the U.S. Food and Drug Administration. The FDA has determined that such clearance or approval is not necessary. This test is used for clinical purposes. It should not be regarded as investigational or for research. This laboratory is certified under the [place] (CLIA-88) as qualified to perform high complexity clinical laboratory testing. The controls stained appropriately.    Diagnosis 1. Surgical [P], gastric antrum and gastric body - GASTRIC ANTRAL MUCOSA WITH NO SPECIFIC HISTOPATHOLOGIC CHANGES - GASTRIC OXYNTIC MUCOSA WITH PARIETAL CELL HYPERPLASIA AS CAN BE SEEN IN HYPERGASTRINEMIC STATES SUCH AS PPI THERAPY. - WARTHIN STARRY STAIN IS NEGATIVE FOR HELICOBACTER PYLORI 2. Surgical [P], esophagus, GE junction - BARRETTS ESOPHAGUS, NEGATIVE FOR DYSPLASIA 3. Surgical [P], colon, cecal inflammation - COLONIC MUCOSA WITH NO SPECIFIC HISTOPATHOLOGIC CHANGES. SEE NOTE - NEGATIVE FOR ACUTE INFLAMMATION, FEATURES OF CHRONICITY, GRANULOMAS OR DYSPLASIA 4. Surgical [P], colon, rectosigmoid, rectal, polyp (8) - TUBULAR ADENOMA(S) WITHOUT HIGH-GRADE DYSPLASIA OR MALIGNANCY - INFLAMMATORY POLYP(S) 5. Surgical [P], colon, distal sigmoid, polyp - TUBULOVILLOUS ADENOMA WITH FOCAL GLANDULAR MISPLACEMENT INTO THE POLYP STALK (PROLAPSE) - NEGATIVE FOR HIGH-GRADE DYSPLASIA OR CARCINOMA - LATERAL MUCOSAL MARGINS ARE NEGATIVE FOR DYSPLASIA. 6. Surgical [P], colon, rectal mass - SUPERFICIAL FRAGMENTS OF TUBULAR ADENOMA(S) WITH  HIGH-GRADE DYSPLASIA. SEE NOTE 7. Surgical [P], colon, anal papilla - LOW-GRADE SQUAMOUS INTRAEPITHELIAL LESION (AIN1, LOW GRADE DYSPLASIA) Diagnosis Note 6. Definite evidence of invasive carcinoma is not seen in the submitted biopsies but the biopsies are relatively superficial. A deeper, more severe process cannot be ruled out. Clinical-endoscopic correlation is suggested. 1 of 3 FINAL for MATHHEW, BUYSSE ([id number]) Jaquita Folds MD Pathologist, Electronic Signature (Case signed 04/10/2020) [name] ###########################################`  This patient encounter took 50 minutes today to perform the following: obtain history, perform exam, review outside records, interpret tests & imaging, counsel the patient on their diagnosis; and, document this encounter, including findings & plan in the electronic health record (EHR).   Allergies Mallie Snooks, CMA; 06/19/2020 11:33 AM) Citalopram Hydrobromide *ANTIDEPRESSANTS* Morphine Sulfate (Concentrate) *ANALGESICS - OPIOID* Sertraline HCl *CHEMICALS* Allergies Reconciled  Medication History Mallie Snooks, CMA; 06/19/2020 11:36 AM) amLODIPine Besylate (10MG  Tablet, Oral) Active. Atorvastatin Calcium (40MG  Tablet, Oral) Active. Doxycycline Hyclate (100MG  Tablet, Oral) Active. Fluticasone Propionate (50MCG/ACT Suspension, Nasal) Active. Lisinopril (30MG  Tablet, Oral) Active. Omeprazole (20MG  Capsule DR, Oral) Active. Polyethylene Glycol 3350 (17GM Packet, Oral) Active. Valerian (500MG  Capsule, Oral) Active. Multiple Vitamin (1 (one) Oral) Active. B Complex Vitamin Plus (Oral) Active. Medications Reconciled    Vitals Mallie Snooks CMA; 06/19/2020 11:36 AM) 06/19/2020 11:36 AM Weight: 183.38 lb Height: 70in Body Surface Area: 2.01 m Body Mass Index: 26.31 kg/m  Temp.: 97.55F  Pulse: 62 (Regular)  BP: 122/72(Sitting, Left Arm, Standard)        Physical Exam Nathan Hector MD; 06/19/2020  12:02 PM)  General Mental Status-Alert. General Appearance-Not in acute distress, Not Sickly. Orientation-Oriented X3. Hydration-Well hydrated. Voice-Normal.  Integumentary Global Assessment Upon inspection and palpation of skin surfaces of the - Axillae: non-tender, no inflammation or ulceration, no drainage. and Distribution of scalp and body hair is normal. General Characteristics Temperature - normal warmth is  noted.  Head and Neck Head-normocephalic, atraumatic with no lesions or palpable masses. Face Global Assessment - atraumatic, no absence of expression. Neck Global Assessment - no abnormal movements, no bruit auscultated on the right, no bruit auscultated on the left, no decreased range of motion, non-tender. Trachea-midline. Thyroid Gland Characteristics - non-tender.  Eye Eyeball - Left-Extraocular movements intact, No Nystagmus - Left. Eyeball - Right-Extraocular movements intact, No Nystagmus - Right. Cornea - Left-No Hazy - Left. Cornea - Right-No Hazy - Right. Sclera/Conjunctiva - Left-No scleral icterus, No Discharge - Left. Sclera/Conjunctiva - Right-No scleral icterus, No Discharge - Right. Pupil - Left-Direct reaction to light normal. Pupil - Right-Direct reaction to light normal.  ENMT Ears Pinna - Left - no drainage observed, no generalized tenderness observed. Pinna - Right - no drainage observed, no generalized tenderness observed. Nose and Sinuses External Inspection of the Nose - no destructive lesion observed. Inspection of the nares - Left - quiet respiration. Inspection of the nares - Right - quiet respiration. Mouth and Throat Lips - Upper Lip - no fissures observed, no pallor noted. Lower Lip - no fissures observed, no pallor noted. Nasopharynx - no discharge present. Oral Cavity/Oropharynx - Tongue - no dryness observed. Oral Mucosa - no cyanosis observed. Hypopharynx - no evidence of airway distress  observed. Note: Hard of hearing  Chest and Lung Exam Inspection Movements - Normal and Symmetrical. Accessory muscles - No use of accessory muscles in breathing. Palpation Palpation of the chest reveals - Non-tender. Auscultation Breath sounds - Normal and Clear.  Cardiovascular Auscultation Rhythm - Regular. Murmurs & Other Heart Sounds - Auscultation of the heart reveals - No Murmurs and No Systolic Clicks.  Abdomen Inspection Inspection of the abdomen reveals - No Visible peristalsis and No Abnormal pulsations. Umbilicus - No Bleeding, No Urine drainage. Palpation/Percussion Palpation and Percussion of the abdomen reveal - Soft, Non Tender, No Rebound tenderness, No Rigidity (guarding) and No Cutaneous hyperesthesia. Note: Abdomen soft. Nontender. Not distended. No umbilical or incisional hernias. No guarding.  Male Genitourinary Sexual Maturity Tanner 5 - Adult hair pattern and Adult penile size and shape. Note: No inguinal hernias. Normal external genitalia. Epididymi, testes, and spermatic cords normal without any masses.  Rectal Note: Perianal skin clear. Right posterior external hemorrhoid tag soft and underwhelming. Normal sphincter tone. Tolerates digital exam. Soft stool in rectal vault. No masses felt to 8-9 cm by digital rectal exam. Prostate mildly enlarged but no major nodularity  Peripheral Vascular Upper Extremity Inspection - Left - No Cyanotic nailbeds - Left, Not Ischemic. Inspection - Right - No Cyanotic nailbeds - Right, Not Ischemic.  Neurologic Neurologic evaluation reveals -normal attention span and ability to concentrate, able to name objects and repeat phrases. Appropriate fund of knowledge , normal sensation and normal coordination. Mental Status Affect - not angry, not paranoid. Cranial Nerves-Normal Bilaterally. Gait-Normal.  Neuropsychiatric Mental status exam performed with findings of-able to articulate well with normal  speech/language, rate, volume and coherence, thought content normal with ability to perform basic computations and apply abstract reasoning and no evidence of hallucinations, delusions, obsessions or homicidal/suicidal ideation.  Musculoskeletal Global Assessment Spine, Ribs and Pelvis - no instability, subluxation or laxity. Right Upper Extremity - no instability, subluxation or laxity.  Lymphatic Head & Neck  General Head & Neck Lymphatics: Bilateral - Description - No Localized lymphadenopathy. Axillary  General Axillary Region: Bilateral - Description - No Localized lymphadenopathy. Femoral & Inguinal  Generalized Femoral & Inguinal Lymphatics: Left - Description - No Localized  lymphadenopathy. Right - Description - No Localized lymphadenopathy.    Assessment & Plan Nathan Hector MD; 06/19/2020 12:20 PM)  RECTAL CANCER (C20) Impression: Pleasant gentleman with focus of cancer and a proximal rectal polyp. 9-12 centimeters by ultrasound. Cannot feel. Hopefully that makes a good likelihood of able to do immediate colorectal anastomosis and avoid diverting ileostomy. We will see.  Discussed at multidisciplinary GI tumor board. Felt to benefit from rectosigmoid resection. Good robotic candidate. Because this is relatively small and seems early stage and rather proximal, neoadjuvant chemoradiation therapy felt to be of little benefit at this time. Hopefully he will just need surgery only.  We'll work to try and get this done soon. Fortunately, he is not symptomatic.  Given his history of near syncope and dizziness with extensive cardiac workup a few years ago, would like to get cardiac clearance to make sure they do not have any concerns given this is a significant operation. He has good performance status, so hopefully they will be able to clear with a chart check versus a formal office visit since since been over a year.  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 Fortunately, he is not anemic at this time. He is open to the idea of IV iron as an alternative if needed perioperatively. Hopefully that will not be needed.  Current Plans You are being scheduled for surgery- Our schedulers will call you.  You should hear from our office's scheduling department within 5 working days about the location, date, and time of surgery. We try to make accommodations for patient's preferences in scheduling surgery, but sometimes the OR schedule or the surgeon's schedule prevents [place] from making those accommodations.  If you have not heard from our office [phone]) in 5 working days, call the office and ask for your surgeon's nurse.  If you have other questions about your diagnosis, plan, or surgery, call the office and ask for your surgeon's nurse.  Written instructions provided The anatomy & physiology of the digestive tract was discussed. The pathophysiology of the colon was discussed. Natural history risks without surgery was discussed. I feel the risks of no intervention will lead to serious problems that outweigh the operative risks; therefore, I recommended a partial colectomy to remove the pathology. Minimally invasive (Robotic/Laparoscopic) & open techniques were discussed.  Risks such as bleeding, infection, abscess, leak, reoperation, possible ostomy, hernia, heart attack, death, and other risks were discussed. I noted a good likelihood this will help address the problem. Goals of post-operative recovery were discussed as well. Need for adequate nutrition, daily bowel regimen and healthy physical activity, to optimize recovery was noted as well. We will work to minimize complications. Educational materials were available as well. Questions were answered. The patient expresses understanding & wishes to proceed with surgery.   NEAR SYNCOPE (R55) Impression: Episode of dizziness and near syncope with rather extensive workup by Dr.  Bettina Gavia with cardiology in 2020 that seems to be underwhelming. Like to double check he feels there is no further workup or clearance needed for this bigger operation  Current Plans I recommended obtaining preoperative cardiac clearance. I am concerned about the health of the patient and the ability to tolerate the operation. Therefore, we will request clearance by cardiology to better assess operative risk & see if a reevaluation, further workup, etc is needed. Also recommendations on how medications such as for anticoagulation and blood pressure should be managed/held/restarted after surgery.  PREOP COLON - ENCOUNTER  FOR PREOPERATIVE EXAMINATION FOR GENERAL SURGICAL PROCEDURE (Z01.818)  Current Plans Pt Education - [facility] Colon Bowel Prep 2018 ERAS/Miralax/Antibiotics Started Neomycin Sulfate 500 MG Oral Tablet, 2 (two) Tablet SEE NOTE, #6, 06/19/2020, No Refill. Local Order: Pharmacist Notes: TAKE TWO TABLETS AT 2 PM, 3 PM, AND 10 PM THE DAY PRIOR TO SURGERY Started metroNIDAZOLE 500 MG Oral Tablet, 2 (two) Tablet three times daily, #6, 1 day starting 06/19/2020, No Refill. Local Order: Pharmacist Notes: Pharmacy Instructions: Take 2 tablets at 2pm, 3pm, and 10pm the day prior to your colon operation. Pt Education - Pamphlet Given - Laparoscopic Colorectal Surgery: discussed with patient and provided information. Pt Education - [facility] Colectomy post-op instructions: discussed with patient and provided information.  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 Fortunately, he is not anemic at this time. He is open to the idea of IV iron as an alternative if needed perioperatively. Hopefully that will not be needed.  Nathan Hector, MD, FACS, MASCRS Gastrointestinal and Minimally Invasive Surgery  [facility] [address] [place], [place] [address] [phone] Fax [phone] Main/Paging  CONTACT INFORMATION: Weekday (9AM-5PM) concerns: Call [facility] main office at [phone] Weeknight (5PM-9AM) or Weekend/Holiday concerns: Check [email or web address] for General Surgery [facility] coverage (Please, do not use SecureChat as it is not reliable communication to operating surgeons for immediate patient care)

## 2020-06-19 NOTE — H&P (Signed)
Nathan [name] Appointment: 06/19/2020 11:30 AM Location: [facility] Patient #: [id number] DOB: 09-Jul-1946 Married / Language: Nathan [name] / Race: White Male  History of Present Illness [name]; 06/19/2020 12:25 PM) The patient is a 74 year old male who presents with colorectal cancer. Note for "Colorectal cancer": ` ` ` Patient sent for surgical consultation at the request of Dr Havery Moros, [facility] gastroenterology  Chief Complaint: Proximal rectal mass with probable focus of adenocarcinoma ` ` The patient is a pleasant gentleman that I had seen a few years ago her some and all complaints of resolved. Had underwhelming colonoscopy in 2012. Had follow-up screening colonoscopy 2021. Numerous adenomas removed. Larger polypoid mass noted in proximal rectum involving 1/3 of the circumference - unable be fully excised due to central dithering back showing at least high-grade dysplasia. Endoscopic ultrasound done with some concern of invasion and biopsy showing high-grade dysplasia with focus of adenocarcinoma. I hard to stage but suspect T1 at the most. Discussed that GI tumor Board. Because this seemed rather proximal, it was felt that surgery first may be appropriate and no need for neoadjuvant chemotherapy radiation therapy. Consultations with medical oncology and surgery requested.  Patient comes today with his wife. Usually moves his bowels every morning. Denies any episodes of bleeding. He walk a half hour without too much difficulty. Does have some right knee soreness. He did have some episodes of lightheadedness dizziness and near syncope. Had a rather extensive cardiac workup last year and to show no evidence of heart failure and dysrhythmia. Seen by Dr. Bettina Gavia. Patient had a TURP for some prostatism and 2001. He is there any abdominal surgery. Does not smoke. Not diabetic. No sleep apnea.  No personal nor family history of inflammatory bowel disease,  irritable bowel syndrome, allergy such as Celiac Sprue, dietary/dairy problems, colitis, ulcers nor gastritis. No recent sick contacts/gastroenteritis. No travel outside the country. No changes in diet. No dysphagia to solids or liquids. No significant heartburn or reflux. No melena, hematemesis, coffee ground emesis. No evidence of prior gastric/peptic ulceration.  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   (Review of systems as stated in this history (HPI) or in the review of systems. Otherwise all other 12 point ROS are negative)   SURGICAL PATHOLOGY CASE: [id number] PATIENT: Nathan [name] Surgical Pathology Report     Clinical History: Rectal lesion; hemorrhoids, diverticulosis; r/o adenoma and malignancy (nt)     FINAL MICROSCOPIC DIAGNOSIS:  A. COLON, POLYPECTOMY: - Tubular adenoma. - Negative for high grade dysplasia.  B. COLON, SIGMOID, POLYPECTOMY: - Colonic mucosa with underlying lymphoid aggregate. - Negative for dysplasia.  C. RECTUM, BIOPSY: - Superficial fragments of tubular adenoma with marked high grade dysplasia and focal intramucosal adenocarcinoma; see comment.  COMMENT:  C. A deeper/adjacent more serious process cannot be entirely excluded on this material alone. Endoscopic correlation is suggested. Dr. Vic Ripper reviewed.  [name] DESCRIPTION:  Specimen A: Received in formalin is a 1.6 x 1.2 x 0.25 cm aggregate of tan-red soft tissue fragments, submitted in 1 block.  Specimen B: Received in formalin is a 1.2 x 0.2 x 0.15 cm of pink-red soft tissue fragments, submitted in 1 block.  Specimen C: Received in formalin is a 1 x 0.7 x 0.2 cm aggregate of tan-pink soft tissue fragments, submitted in 1 block.  SW 05/24/2020    Final Diagnosis performed by [name], MD. Electronically signed 05/25/2020 Technical and / or Professional components performed at [facility].  [facility],  [place] [address] [place], [place] [address]. Immunohistochemistry Technical component (if applicable) was performed at [facility]. [address], [place], [place], [place] [address]. IMMUNOHISTOCHEMISTRY DISCLAIMER (if applicable): Some of these immunohistochemical stains may have been developed and the performance characteristics determine by [facility]. Some may not have been cleared or approved by the U.S. Food and Drug Administration. The FDA has determined that such clearance or approval is not necessary. This test is used for clinical purposes. It should not be regarded as investigational or for research. This laboratory is certified under the [place] (CLIA-88) as qualified to perform high complexity clinical laboratory testing. The controls stained appropriately.    Diagnosis 1. Surgical [P], gastric antrum and gastric body - GASTRIC ANTRAL MUCOSA WITH NO SPECIFIC HISTOPATHOLOGIC CHANGES - GASTRIC OXYNTIC MUCOSA WITH PARIETAL CELL HYPERPLASIA AS CAN BE SEEN IN HYPERGASTRINEMIC STATES SUCH AS PPI THERAPY. - WARTHIN STARRY STAIN IS NEGATIVE FOR HELICOBACTER PYLORI 2. Surgical [P], esophagus, GE junction - BARRETTS ESOPHAGUS, NEGATIVE FOR DYSPLASIA 3. Surgical [P], colon, cecal inflammation - COLONIC MUCOSA WITH NO SPECIFIC HISTOPATHOLOGIC CHANGES. SEE NOTE - NEGATIVE FOR ACUTE INFLAMMATION, FEATURES OF CHRONICITY, GRANULOMAS OR DYSPLASIA 4. Surgical [P], colon, rectosigmoid, rectal, polyp (8) - TUBULAR ADENOMA(S) WITHOUT HIGH-GRADE DYSPLASIA OR MALIGNANCY - INFLAMMATORY POLYP(S) 5. Surgical [P], colon, distal sigmoid, polyp - TUBULOVILLOUS ADENOMA WITH FOCAL GLANDULAR MISPLACEMENT INTO THE POLYP STALK (PROLAPSE) - NEGATIVE FOR HIGH-GRADE DYSPLASIA OR CARCINOMA - LATERAL MUCOSAL MARGINS ARE NEGATIVE FOR DYSPLASIA. 6. Surgical [P], colon, rectal mass - SUPERFICIAL FRAGMENTS OF TUBULAR ADENOMA(S) WITH  HIGH-GRADE DYSPLASIA. SEE NOTE 7. Surgical [P], colon, anal papilla - LOW-GRADE SQUAMOUS INTRAEPITHELIAL LESION (AIN1, LOW GRADE DYSPLASIA) Diagnosis Note 6. Definite evidence of invasive carcinoma is not seen in the submitted biopsies but the biopsies are relatively superficial. A deeper, more severe process cannot be ruled out. Clinical-endoscopic correlation is suggested. 1 of 3 FINAL for JURGEN, GROENEVELD ([id number]) Jaquita Folds MD Pathologist, Electronic Signature (Case signed 04/10/2020) [name] ###########################################`  This patient encounter took 50 minutes today to perform the following: obtain history, perform exam, review outside records, interpret tests & imaging, counsel the patient on their diagnosis; and, document this encounter, including findings & plan in the electronic health record (EHR).   Allergies Mallie Snooks, CMA; 06/19/2020 11:33 AM) Citalopram Hydrobromide *ANTIDEPRESSANTS* Morphine Sulfate (Concentrate) *ANALGESICS - OPIOID* Sertraline HCl *CHEMICALS* Allergies Reconciled  Medication History Mallie Snooks, CMA; 06/19/2020 11:36 AM) amLODIPine Besylate (10MG  Tablet, Oral) Active. Atorvastatin Calcium (40MG  Tablet, Oral) Active. Doxycycline Hyclate (100MG  Tablet, Oral) Active. Fluticasone Propionate (50MCG/ACT Suspension, Nasal) Active. Lisinopril (30MG  Tablet, Oral) Active. Omeprazole (20MG  Capsule DR, Oral) Active. Polyethylene Glycol 3350 (17GM Packet, Oral) Active. Valerian (500MG  Capsule, Oral) Active. Multiple Vitamin (1 (one) Oral) Active. B Complex Vitamin Plus (Oral) Active. Medications Reconciled    Vitals Mallie Snooks CMA; 06/19/2020 11:36 AM) 06/19/2020 11:36 AM Weight: 183.38 lb Height: 70in Body Surface Area: 2.01 m Body Mass Index: 26.31 kg/m  Temp.: 97.71F  Pulse: 62 (Regular)  BP: 122/72(Sitting, Left Arm, Standard)        Physical Exam [name]; 06/19/2020  12:02 PM)  General Mental Status-Alert. General Appearance-Not in acute distress, Not Sickly. Orientation-Oriented X3. Hydration-Well hydrated. Voice-Normal.  Integumentary Global Assessment Upon inspection and palpation of skin surfaces of the - Axillae: non-tender, no inflammation or ulceration, no drainage. and Distribution of scalp and body hair is normal. General Characteristics Temperature - normal warmth is  noted.  Head and Neck Head-normocephalic, atraumatic with no lesions or palpable masses. Face Global Assessment - atraumatic, no absence of expression. Neck Global Assessment - no abnormal movements, no bruit auscultated on the right, no bruit auscultated on the left, no decreased range of motion, non-tender. Trachea-midline. Thyroid Gland Characteristics - non-tender.  Eye Eyeball - Left-Extraocular movements intact, No Nystagmus - Left. Eyeball - Right-Extraocular movements intact, No Nystagmus - Right. Cornea - Left-No Hazy - Left. Cornea - Right-No Hazy - Right. Sclera/Conjunctiva - Left-No scleral icterus, No Discharge - Left. Sclera/Conjunctiva - Right-No scleral icterus, No Discharge - Right. Pupil - Left-Direct reaction to light normal. Pupil - Right-Direct reaction to light normal.  ENMT Ears Pinna - Left - no drainage observed, no generalized tenderness observed. Pinna - Right - no drainage observed, no generalized tenderness observed. Nose and Sinuses External Inspection of the Nose - no destructive lesion observed. Inspection of the nares - Left - quiet respiration. Inspection of the nares - Right - quiet respiration. Mouth and Throat Lips - Upper Lip - no fissures observed, no pallor noted. Lower Lip - no fissures observed, no pallor noted. Nasopharynx - no discharge present. Oral Cavity/Oropharynx - Tongue - no dryness observed. Oral Mucosa - no cyanosis observed. Hypopharynx - no evidence of airway distress  observed. Note: Hard of hearing  Chest and Lung Exam Inspection Movements - Normal and Symmetrical. Accessory muscles - No use of accessory muscles in breathing. Palpation Palpation of the chest reveals - Non-tender. Auscultation Breath sounds - Normal and Clear.  Cardiovascular Auscultation Rhythm - Regular. Murmurs & Other Heart Sounds - Auscultation of the heart reveals - No Murmurs and No Systolic Clicks.  Abdomen Inspection Inspection of the abdomen reveals - No Visible peristalsis and No Abnormal pulsations. Umbilicus - No Bleeding, No Urine drainage. Palpation/Percussion Palpation and Percussion of the abdomen reveal - Soft, Non Tender, No Rebound tenderness, No Rigidity (guarding) and No Cutaneous hyperesthesia. Note: Abdomen soft. Nontender. Not distended. No umbilical or incisional hernias. No guarding.  Male Genitourinary Sexual Maturity Tanner 5 - Adult hair pattern and Adult penile size and shape. Note: No inguinal hernias. Normal external genitalia. Epididymi, testes, and spermatic cords normal without any masses.  Rectal Note: Perianal skin clear. Right posterior external hemorrhoid tag soft and underwhelming. Normal sphincter tone. Tolerates digital exam. Soft stool in rectal vault. No masses felt to 8-9 cm by digital rectal exam. Prostate mildly enlarged but no major nodularity  Peripheral Vascular Upper Extremity Inspection - Left - No Cyanotic nailbeds - Left, Not Ischemic. Inspection - Right - No Cyanotic nailbeds - Right, Not Ischemic.  Neurologic Neurologic evaluation reveals -normal attention span and ability to concentrate, able to name objects and repeat phrases. Appropriate fund of knowledge , normal sensation and normal coordination. Mental Status Affect - not angry, not paranoid. Cranial Nerves-Normal Bilaterally. Gait-Normal.  Neuropsychiatric Mental status exam performed with findings of-able to articulate well with normal  speech/language, rate, volume and coherence, thought content normal with ability to perform basic computations and apply abstract reasoning and no evidence of hallucinations, delusions, obsessions or homicidal/suicidal ideation.  Musculoskeletal Global Assessment Spine, Ribs and Pelvis - no instability, subluxation or laxity. Right Upper Extremity - no instability, subluxation or laxity.  Lymphatic Head & Neck  General Head & Neck Lymphatics: Bilateral - Description - No Localized lymphadenopathy. Axillary  General Axillary Region: Bilateral - Description - No Localized lymphadenopathy. Femoral & Inguinal  Generalized Femoral & Inguinal Lymphatics: Left - Description - No Localized  lymphadenopathy. Right - Description - No Localized lymphadenopathy.    Assessment & Plan [name]; 06/19/2020 12:20 PM)  RECTAL CANCER (C20) Impression: Pleasant gentleman with focus of cancer and a proximal rectal polyp. 9-12 centimeters by ultrasound. Cannot feel. Hopefully that makes a good likelihood of able to do immediate colorectal anastomosis and avoid diverting ileostomy. We will see.  Discussed at multidisciplinary GI tumor board. Felt to benefit from rectosigmoid resection. Good robotic candidate. Because this is relatively small and seems early stage and rather proximal, neoadjuvant chemoradiation therapy felt to be of little benefit at this time. Hopefully he will just need surgery only.  We'll work to try and get this done soon. Fortunately, he is not symptomatic.  Given his history of near syncope and dizziness with extensive cardiac workup a few years ago, would like to get cardiac clearance to make sure they do not have any concerns given this is a significant operation. He has good performance status, so hopefully they will be able to clear with a chart check versus a formal office visit since since been over a year.  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 Fortunately, he is not anemic at this time. He is open to the idea of IV iron as an alternative if needed perioperatively. Hopefully that will not be needed.  Current Plans You are being scheduled for surgery- Our schedulers will call you.  You should hear from our office's scheduling department within 5 working days about the location, date, and time of surgery. We try to make accommodations for patient's preferences in scheduling surgery, but sometimes the OR schedule or the surgeon's schedule prevents [place] from making those accommodations.  If you have not heard from our office [phone]) in 5 working days, call the office and ask for your surgeon's nurse.  If you have other questions about your diagnosis, plan, or surgery, call the office and ask for your surgeon's nurse.  Written instructions provided The anatomy & physiology of the digestive tract was discussed. The pathophysiology of the colon was discussed. Natural history risks without surgery was discussed. I feel the risks of no intervention will lead to serious problems that outweigh the operative risks; therefore, I recommended a partial colectomy to remove the pathology. Minimally invasive (Robotic/Laparoscopic) & open techniques were discussed.  Risks such as bleeding, infection, abscess, leak, reoperation, possible ostomy, hernia, heart attack, death, and other risks were discussed. I noted a good likelihood this will help address the problem. Goals of post-operative recovery were discussed as well. Need for adequate nutrition, daily bowel regimen and healthy physical activity, to optimize recovery was noted as well. We will work to minimize complications. Educational materials were available as well. Questions were answered. The patient expresses understanding & wishes to proceed with surgery.   NEAR SYNCOPE (R55) Impression: Episode of dizziness and near syncope with rather extensive workup by Dr.  Bettina Gavia with cardiology in 2020 that seems to be underwhelming. Like to double check he feels there is no further workup or clearance needed for this bigger operation  Current Plans I recommended obtaining preoperative cardiac clearance. I am concerned about the health of the patient and the ability to tolerate the operation. Therefore, we will request clearance by cardiology to better assess operative risk & see if a reevaluation, further workup, etc is needed. Also recommendations on how medications such as for anticoagulation and blood pressure should be managed/held/restarted after surgery.  PREOP COLON - ENCOUNTER  FOR PREOPERATIVE EXAMINATION FOR GENERAL SURGICAL PROCEDURE (Z01.818)  Current Plans Pt Education - [facility] Colon Bowel Prep 2018 ERAS/Miralax/Antibiotics Started Neomycin Sulfate 500 MG Oral Tablet, 2 (two) Tablet SEE NOTE, #6, 06/19/2020, No Refill. Local Order: Pharmacist Notes: TAKE TWO TABLETS AT 2 PM, 3 PM, AND 10 PM THE DAY PRIOR TO SURGERY Started metroNIDAZOLE 500 MG Oral Tablet, 2 (two) Tablet three times daily, #6, 1 day starting 06/19/2020, No Refill. Local Order: Pharmacist Notes: Pharmacy Instructions: Take 2 tablets at 2pm, 3pm, and 10pm the day prior to your colon operation. Pt Education - Pamphlet Given - Laparoscopic Colorectal Surgery: discussed with patient and provided information. Pt Education - [facility] Colectomy post-op instructions: discussed with patient and provided information.  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 Fortunately, he is not anemic at this time. He is open to the idea of IV iron as an alternative if needed perioperatively. Hopefully that will not be needed.  Adin Hector, MD, FACS, MASCRS Gastrointestinal and Minimally Invasive Surgery  [facility] [address] [place], [place] [address] [phone] Fax [phone] Main/Paging  CONTACT INFORMATION: Weekday (9AM-5PM) concerns: Call [facility] main office at [phone] Weeknight (5PM-9AM) or Weekend/Holiday concerns: Check [email or web address] for General Surgery [facility] coverage (Please, do not use SecureChat as it is not reliable communication to operating surgeons for immediate patient care)

## 2020-06-20 ENCOUNTER — Encounter (HOSPITAL_COMMUNITY): Payer: Self-pay | Admitting: Surgery

## 2020-06-20 ENCOUNTER — Ambulatory Visit: Payer: Medicare Other | Admitting: Endocrinology

## 2020-06-20 ENCOUNTER — Other Ambulatory Visit (HOSPITAL_COMMUNITY)
Admission: RE | Admit: 2020-06-20 | Discharge: 2020-06-20 | Disposition: A | Discharge status: Home / Self Care | Payer: Medicare Other | Source: Ambulatory Visit | Attending: Physician Assistant | Admitting: Physician Assistant

## 2020-06-20 ENCOUNTER — Other Ambulatory Visit: Payer: Self-pay

## 2020-06-20 DIAGNOSIS — K219 Gastro-esophageal reflux disease without esophagitis: Secondary | ICD-10-CM | POA: Diagnosis not present

## 2020-06-20 DIAGNOSIS — Z01812 Encounter for preprocedural laboratory examination: Secondary | ICD-10-CM | POA: Insufficient documentation

## 2020-06-20 DIAGNOSIS — Z96651 Presence of right artificial knee joint: Secondary | ICD-10-CM | POA: Diagnosis not present

## 2020-06-20 DIAGNOSIS — I10 Essential (primary) hypertension: Secondary | ICD-10-CM | POA: Diagnosis not present

## 2020-06-20 DIAGNOSIS — I6521 Occlusion and stenosis of right carotid artery: Secondary | ICD-10-CM | POA: Diagnosis not present

## 2020-06-20 DIAGNOSIS — R739 Hyperglycemia, unspecified: Secondary | ICD-10-CM | POA: Diagnosis not present

## 2020-06-20 DIAGNOSIS — Z87891 Personal history of nicotine dependence: Secondary | ICD-10-CM | POA: Diagnosis not present

## 2020-06-20 DIAGNOSIS — R001 Bradycardia, unspecified: Secondary | ICD-10-CM | POA: Diagnosis not present

## 2020-06-20 DIAGNOSIS — Z531 Procedure and treatment not carried out because of patient's decision for reasons of belief and group pressure: Secondary | ICD-10-CM | POA: Diagnosis not present

## 2020-06-20 DIAGNOSIS — I959 Hypotension, unspecified: Secondary | ICD-10-CM | POA: Diagnosis not present

## 2020-06-20 DIAGNOSIS — Z20822 Contact with and (suspected) exposure to covid-19: Secondary | ICD-10-CM | POA: Insufficient documentation

## 2020-06-20 DIAGNOSIS — C19 Malignant neoplasm of rectosigmoid junction: Secondary | ICD-10-CM | POA: Diagnosis not present

## 2020-06-20 DIAGNOSIS — C20 Malignant neoplasm of rectum: Secondary | ICD-10-CM | POA: Diagnosis not present

## 2020-06-20 DIAGNOSIS — E785 Hyperlipidemia, unspecified: Secondary | ICD-10-CM | POA: Diagnosis not present

## 2020-06-20 DIAGNOSIS — Z79899 Other long term (current) drug therapy: Secondary | ICD-10-CM | POA: Diagnosis not present

## 2020-06-20 LAB — SARS CORONAVIRUS 2 (TAT 6-24 HRS): SARS Coronavirus 2: NEGATIVE

## 2020-06-20 MED ORDER — BUPIVACAINE LIPOSOME 1.3 % IJ SUSP: 20.0000 mL | Freq: Once | INTRAMUSCULAR | Status: DC

## 2020-06-20 NOTE — Progress Notes (Signed)
COVID Vaccine Completed: Yes Date COVID Vaccine completed: 09/20/19, 10/20/19 COVID vaccine manufacturer: [place]      PCP - Ma Hillock, DO Cardiologist - Dr. Lin Givens  Chest x-ray - 11/30/19 in epic  EKG - 12/01/19 in epic Stress Test - 02/16/2019 in epic ECHO - 02/11/2019 in epic Cardiac Cath - N/A Pacemaker/ICD device last checked: N/A  Sleep Study - N/A CPAP - N/A  Fasting Blood Sugar - N/A Checks Blood Sugar _N/A___ times a day  Blood Thinner Instructions: N/A Aspirin Instructions:N/A Last Dose:N/A  Activity level:  Able to exercise without symptoms     Anesthesia review: N/A  Patient denies shortness of breath, fever, cough and chest pain at PAT appointment   Patient verbalized understanding of instructions that were given to them at the PAT appointment. Patient was also instructed that they will need to review over the PAT instructions again at home before surgery.

## 2020-06-21 ENCOUNTER — Other Ambulatory Visit: Payer: Self-pay

## 2020-06-21 ENCOUNTER — Inpatient Hospital Stay (HOSPITAL_COMMUNITY)
Admission: RE | Admit: 2020-06-21 | Discharge: 2020-06-24 | DRG: 331 | Disposition: A | Discharge status: Home / Self Care | Payer: Medicare Other | Attending: Surgery | Admitting: Surgery

## 2020-06-21 ENCOUNTER — Inpatient Hospital Stay (HOSPITAL_COMMUNITY): Payer: Medicare Other | Admitting: Anesthesiology

## 2020-06-21 ENCOUNTER — Encounter (HOSPITAL_COMMUNITY): Payer: Self-pay | Admitting: Surgery

## 2020-06-21 ENCOUNTER — Encounter: Payer: Self-pay | Admitting: Surgery

## 2020-06-21 ENCOUNTER — Encounter (HOSPITAL_COMMUNITY)
Admission: RE | Disposition: A | Discharge status: Home / Self Care | Payer: Self-pay | Source: Home / Self Care | Attending: Surgery

## 2020-06-21 DIAGNOSIS — C19 Malignant neoplasm of rectosigmoid junction: Secondary | ICD-10-CM | POA: Diagnosis present

## 2020-06-21 DIAGNOSIS — R739 Hyperglycemia, unspecified: Secondary | ICD-10-CM | POA: Diagnosis present

## 2020-06-21 DIAGNOSIS — I959 Hypotension, unspecified: Secondary | ICD-10-CM | POA: Diagnosis not present

## 2020-06-21 DIAGNOSIS — Z974 Presence of external hearing-aid: Secondary | ICD-10-CM

## 2020-06-21 DIAGNOSIS — K219 Gastro-esophageal reflux disease without esophagitis: Secondary | ICD-10-CM | POA: Diagnosis present

## 2020-06-21 DIAGNOSIS — R001 Bradycardia, unspecified: Secondary | ICD-10-CM | POA: Diagnosis not present

## 2020-06-21 DIAGNOSIS — Z20822 Contact with and (suspected) exposure to covid-19: Secondary | ICD-10-CM | POA: Diagnosis present

## 2020-06-21 DIAGNOSIS — Z79899 Other long term (current) drug therapy: Secondary | ICD-10-CM

## 2020-06-21 DIAGNOSIS — I10 Essential (primary) hypertension: Secondary | ICD-10-CM | POA: Diagnosis present

## 2020-06-21 DIAGNOSIS — Z87891 Personal history of nicotine dependence: Secondary | ICD-10-CM

## 2020-06-21 DIAGNOSIS — C20 Malignant neoplasm of rectum: Secondary | ICD-10-CM | POA: Diagnosis present

## 2020-06-21 DIAGNOSIS — E785 Hyperlipidemia, unspecified: Secondary | ICD-10-CM | POA: Diagnosis present

## 2020-06-21 DIAGNOSIS — Z531 Procedure and treatment not carried out because of patient's decision for reasons of belief and group pressure: Secondary | ICD-10-CM | POA: Diagnosis present

## 2020-06-21 DIAGNOSIS — Z96651 Presence of right artificial knee joint: Secondary | ICD-10-CM | POA: Diagnosis present

## 2020-06-21 DIAGNOSIS — D126 Benign neoplasm of colon, unspecified: Secondary | ICD-10-CM | POA: Diagnosis present

## 2020-06-21 HISTORY — DX: Malignant neoplasm of rectum: C20

## 2020-06-21 HISTORY — PX: PROCTOSCOPY: SHX2266

## 2020-06-21 HISTORY — DX: Thyrotoxicosis, unspecified without thyrotoxic crisis or storm: E05.90

## 2020-06-21 HISTORY — PX: XI ROBOTIC ASSISTED LOWER ANTERIOR RESECTION: SHX6558

## 2020-06-21 HISTORY — PX: OSTOMY: SHX5997

## 2020-06-21 HISTORY — DX: Gastro-esophageal reflux disease without esophagitis: K21.9

## 2020-06-21 HISTORY — DX: Anemia, unspecified: D64.9

## 2020-06-21 LAB — CBC
HCT: 46.2 % (ref 39.0–52.0)
HCT: 32.8 % — ABNORMAL LOW (ref 39.0–52.0)
Hemoglobin: 15.5 g/dL (ref 13.0–17.0)
Hemoglobin: 11.1 g/dL — ABNORMAL LOW (ref 13.0–17.0)
MCH: 29.8 pg (ref 26.0–34.0)
MCH: 30.7 pg (ref 26.0–34.0)
MCHC: 33.5 g/dL (ref 30.0–36.0)
MCHC: 33.8 g/dL (ref 30.0–36.0)
MCV: 88.7 fL (ref 80.0–100.0)
MCV: 90.6 fL (ref 80.0–100.0)
Platelets: 304 10*3/uL (ref 150–400)
Platelets: 217 10*3/uL (ref 150–400)
RBC: 5.21 MIL/uL (ref 4.22–5.81)
RBC: 3.62 MIL/uL — ABNORMAL LOW (ref 4.22–5.81)
RDW: 13.9 % (ref 11.5–15.5)
RDW: 14.1 % (ref 11.5–15.5)
WBC: 7.6 10*3/uL (ref 4.0–10.5)
WBC: 11.2 10*3/uL — ABNORMAL HIGH (ref 4.0–10.5)
nRBC: 0 % (ref 0.0–0.2)
nRBC: 0 % (ref 0.0–0.2)

## 2020-06-21 LAB — COMPREHENSIVE METABOLIC PANEL
ALT: 21 U/L (ref 0–44)
ALT: 15 U/L (ref 0–44)
AST: 27 U/L (ref 15–41)
AST: 21 U/L (ref 15–41)
Albumin: 4.6 g/dL (ref 3.5–5.0)
Albumin: 3 g/dL — ABNORMAL LOW (ref 3.5–5.0)
Alkaline Phosphatase: 76 U/L (ref 38–126)
Alkaline Phosphatase: 48 U/L (ref 38–126)
Anion gap: 12 (ref 5–15)
Anion gap: 10 (ref 5–15)
BUN: 10 mg/dL (ref 8–23)
BUN: 11 mg/dL (ref 8–23)
CO2: 22 mmol/L (ref 22–32)
CO2: 21 mmol/L — ABNORMAL LOW (ref 22–32)
Calcium: 9.4 mg/dL (ref 8.9–10.3)
Calcium: 7.9 mg/dL — ABNORMAL LOW (ref 8.9–10.3)
Chloride: 104 mmol/L (ref 98–111)
Chloride: 107 mmol/L (ref 98–111)
Creatinine, Ser: 1.15 mg/dL (ref 0.61–1.24)
Creatinine, Ser: 1.13 mg/dL (ref 0.61–1.24)
GFR, Estimated: >60 mL/min (ref >60)
GFR, Estimated: >60 mL/min (ref >60)
Glucose, Bld: 97 mg/dL (ref 70–99)
Glucose, Bld: 184 mg/dL — ABNORMAL HIGH (ref 70–99)
Potassium: 3.4 mmol/L — ABNORMAL LOW (ref 3.5–5.1)
Potassium: 3.6 mmol/L (ref 3.5–5.1)
Sodium: 138 mmol/L (ref 135–145)
Sodium: 138 mmol/L (ref 135–145)
Total Bilirubin: 0.9 mg/dL (ref 0.3–1.2)
Total Bilirubin: 0.6 mg/dL (ref 0.3–1.2)
Total Protein: 7.7 g/dL (ref 6.5–8.1)
Total Protein: 5.1 g/dL — ABNORMAL LOW (ref 6.5–8.1)

## 2020-06-21 LAB — HEMOGLOBIN A1C
Hgb A1c MFr Bld: 5.5 % (ref 4.8–5.6)
Mean Plasma Glucose: 111.15 mg/dL

## 2020-06-21 LAB — GLUCOSE, CAPILLARY: Glucose-Capillary: 168 mg/dL — ABNORMAL HIGH (ref 70–99)

## 2020-06-21 SURGERY — RESECTION, RECTUM, LOW ANTERIOR, ROBOT-ASSISTED
Anesthesia: General | Site: Abdomen

## 2020-06-21 MED ORDER — ORAL CARE MOUTH RINSE: 15.0000 mL | Freq: Once | OROMUCOSAL | Status: AC

## 2020-06-21 MED ORDER — ENSURE PRE-SURGERY PO LIQD: 296.0000 mL | Freq: Once | ORAL | Status: DC

## 2020-06-21 MED ORDER — ACETAMINOPHEN 500 MG PO TABS
1000.0000 mg | ORAL_TABLET | ORAL | Status: AC
  Administered 2020-06-21: 1000 mg via ORAL
  Filled 2020-06-21: qty 2

## 2020-06-21 MED ORDER — CHLORHEXIDINE GLUCONATE CLOTH 2 % EX PADS: 6.0000 | MEDICATED_PAD | Freq: Once | CUTANEOUS | Status: DC

## 2020-06-21 MED ORDER — DIPHENHYDRAMINE HCL 50 MG/ML IJ SOLN: 12.5000 mg | Freq: Four times a day (QID) | INTRAMUSCULAR | Status: DC | PRN

## 2020-06-21 MED ORDER — ACETAMINOPHEN 500 MG PO TABS
1000.0000 mg | ORAL_TABLET | Freq: Four times a day (QID) | ORAL | Status: DC
  Administered 2020-06-21 – 2020-06-24 (×10): 1000 mg via ORAL
  Filled 2020-06-21 (×10): qty 2

## 2020-06-21 MED ORDER — CHLORHEXIDINE GLUCONATE 0.12 % MT SOLN
15.0000 mL | Freq: Once | OROMUCOSAL | Status: AC
  Administered 2020-06-21: 15 mL via OROMUCOSAL

## 2020-06-21 MED ORDER — PROPOFOL 10 MG/ML IV BOLUS
INTRAVENOUS | Status: DC | PRN
  Administered 2020-06-21: 30 mg via INTRAVENOUS
  Administered 2020-06-21: 100 mg via INTRAVENOUS

## 2020-06-21 MED ORDER — ALUM & MAG HYDROXIDE-SIMETH 200-200-20 MG/5ML PO SUSP: 30.0000 mL | Freq: Four times a day (QID) | ORAL | Status: DC | PRN

## 2020-06-21 MED ORDER — NEOMYCIN SULFATE 500 MG PO TABS: 1000.0000 mg | ORAL_TABLET | ORAL | Status: DC

## 2020-06-21 MED ORDER — ONDANSETRON HCL 4 MG PO TABS: 4.0000 mg | ORAL_TABLET | Freq: Four times a day (QID) | ORAL | Status: DC | PRN

## 2020-06-21 MED ORDER — FLUTICASONE PROPIONATE 50 MCG/ACT NA SUSP
2.0000 | Freq: Every day | NASAL | Status: DC
  Administered 2020-06-22: 2 via NASAL
  Filled 2020-06-21 (×2): qty 16

## 2020-06-21 MED ORDER — FENTANYL CITRATE (PF) 100 MCG/2ML IJ SOLN
INTRAMUSCULAR | Status: AC
  Administered 2020-06-21: 25 ug via INTRAVENOUS
  Filled 2020-06-21: qty 2

## 2020-06-21 MED ORDER — TRAMADOL HCL 50 MG PO TABS: 50.0000 mg | ORAL_TABLET | Freq: Four times a day (QID) | ORAL | Status: DC | PRN

## 2020-06-21 MED ORDER — ONDANSETRON HCL 4 MG/2ML IJ SOLN: 4.0000 mg | Freq: Four times a day (QID) | INTRAMUSCULAR | Status: DC | PRN

## 2020-06-21 MED ORDER — ONDANSETRON HCL 4 MG/2ML IJ SOLN
INTRAMUSCULAR | Status: DC | PRN
  Administered 2020-06-21: 4 mg via INTRAVENOUS

## 2020-06-21 MED ORDER — ALVIMOPAN 12 MG PO CAPS
12.0000 mg | ORAL_CAPSULE | ORAL | Status: AC
  Administered 2020-06-21: 12 mg via ORAL
  Filled 2020-06-21: qty 1

## 2020-06-21 MED ORDER — B COMPLEX-C PO TABS
1.0000 | ORAL_TABLET | Freq: Every day | ORAL | Status: DC
  Administered 2020-06-21 – 2020-06-24 (×4): 1 via ORAL
  Filled 2020-06-21 (×4): qty 1

## 2020-06-21 MED ORDER — PROCHLORPERAZINE MALEATE 10 MG PO TABS
10.0000 mg | ORAL_TABLET | Freq: Four times a day (QID) | ORAL | Status: DC | PRN
  Filled 2020-06-21: qty 1

## 2020-06-21 MED ORDER — AMLODIPINE BESYLATE 10 MG PO TABS
10.0000 mg | ORAL_TABLET | Freq: Every day | ORAL | Status: DC
  Administered 2020-06-22 – 2020-06-24 (×3): 10 mg via ORAL
  Filled 2020-06-21 (×5): qty 1

## 2020-06-21 MED ORDER — ENSURE SURGERY PO LIQD
237.0000 mL | Freq: Two times a day (BID) | ORAL | Status: DC
  Administered 2020-06-22: 237 mL via ORAL
  Filled 2020-06-21 (×6): qty 237

## 2020-06-21 MED ORDER — LIDOCAINE 2% (20 MG/ML) 5 ML SYRINGE
INTRAMUSCULAR | Status: DC | PRN
  Administered 2020-06-21: 80 mg via INTRAVENOUS

## 2020-06-21 MED ORDER — MULTIPLE VITAMIN PO TABS: 1.0000 | ORAL_TABLET | Freq: Every day | ORAL | Status: DC

## 2020-06-21 MED ORDER — LIDOCAINE 2% (20 MG/ML) 5 ML SYRINGE
INTRAMUSCULAR | Status: DC | PRN
  Administered 2020-06-21: 3.6 mL/h via INTRAVENOUS

## 2020-06-21 MED ORDER — B COMPLEX PO TABS: 1.0000 | ORAL_TABLET | ORAL | Status: DC

## 2020-06-21 MED ORDER — BUPIVACAINE-EPINEPHRINE (PF) 0.25% -1:200000 IJ SOLN
INTRAMUSCULAR | Status: AC
  Filled 2020-06-21: qty 60

## 2020-06-21 MED ORDER — ENOXAPARIN SODIUM 40 MG/0.4ML ~~LOC~~ SOLN
40.0000 mg | SUBCUTANEOUS | Status: DC
  Administered 2020-06-22 – 2020-06-24 (×3): 40 mg via SUBCUTANEOUS
  Filled 2020-06-21 (×3): qty 0.4

## 2020-06-21 MED ORDER — SODIUM CHLORIDE 0.9 % IV BOLUS
500.0000 mL | Freq: Once | INTRAVENOUS | Status: AC | PRN
  Administered 2020-06-21: 500 mL via INTRAVENOUS

## 2020-06-21 MED ORDER — POLYETHYLENE GLYCOL 3350 17 GM/SCOOP PO POWD
1.0000 | Freq: Once | ORAL | Status: DC
  Filled 2020-06-21: qty 255

## 2020-06-21 MED ORDER — MAGIC MOUTHWASH
15.0000 mL | Freq: Four times a day (QID) | ORAL | Status: DC | PRN
  Filled 2020-06-21: qty 15

## 2020-06-21 MED ORDER — LACTATED RINGERS IV BOLUS: 1000.0000 mL | Freq: Three times a day (TID) | INTRAVENOUS | Status: AC | PRN

## 2020-06-21 MED ORDER — BUPIVACAINE-EPINEPHRINE (PF) 0.25% -1:200000 IJ SOLN
INTRAMUSCULAR | Status: DC | PRN
  Administered 2020-06-21: 60 mL

## 2020-06-21 MED ORDER — SODIUM CHLORIDE 0.9 % IV SOLN
2.0000 g | Freq: Two times a day (BID) | INTRAVENOUS | Status: AC
  Administered 2020-06-21: 2 g via INTRAVENOUS
  Filled 2020-06-21: qty 2

## 2020-06-21 MED ORDER — LACTATED RINGERS IR SOLN
Status: DC | PRN
  Administered 2020-06-21: 1000 mL

## 2020-06-21 MED ORDER — PROCHLORPERAZINE EDISYLATE 10 MG/2ML IJ SOLN: 5.0000 mg | Freq: Four times a day (QID) | INTRAMUSCULAR | Status: DC | PRN

## 2020-06-21 MED ORDER — LACTATED RINGERS IV SOLN: INTRAVENOUS | Status: DC

## 2020-06-21 MED ORDER — ALVIMOPAN 12 MG PO CAPS
12.0000 mg | ORAL_CAPSULE | Freq: Two times a day (BID) | ORAL | Status: DC
  Administered 2020-06-22: 12 mg via ORAL
  Filled 2020-06-21: qty 1

## 2020-06-21 MED ORDER — ORAL CARE MOUTH RINSE
15.0000 mL | Freq: Two times a day (BID) | OROMUCOSAL | Status: DC
  Administered 2020-06-21 – 2020-06-24 (×4): 15 mL via OROMUCOSAL

## 2020-06-21 MED ORDER — HYDROMORPHONE HCL 1 MG/ML IJ SOLN: 0.5000 mg | INTRAMUSCULAR | Status: DC | PRN

## 2020-06-21 MED ORDER — KETOROLAC TROMETHAMINE 30 MG/ML IJ SOLN
INTRAMUSCULAR | Status: DC | PRN
  Administered 2020-06-21: 15 mg via INTRAVENOUS

## 2020-06-21 MED ORDER — BISACODYL 5 MG PO TBEC: 20.0000 mg | DELAYED_RELEASE_TABLET | Freq: Once | ORAL | Status: DC

## 2020-06-21 MED ORDER — BUPIVACAINE LIPOSOME 1.3 % IJ SUSP
INTRAMUSCULAR | Status: DC | PRN
  Administered 2020-06-21: 20 mL

## 2020-06-21 MED ORDER — BUPIVACAINE LIPOSOME 1.3 % IJ SUSP
20.0000 mL | Freq: Once | INTRAMUSCULAR | Status: DC
  Filled 2020-06-21: qty 20

## 2020-06-21 MED ORDER — KETAMINE HCL 100 MG/ML IJ SOLN
INTRAMUSCULAR | Status: DC | PRN
  Administered 2020-06-21: 30 mg via INTRAVENOUS

## 2020-06-21 MED ORDER — PROPOFOL 10 MG/ML IV BOLUS
INTRAVENOUS | Status: AC
  Filled 2020-06-21: qty 20

## 2020-06-21 MED ORDER — GABAPENTIN 100 MG PO CAPS
200.0000 mg | ORAL_CAPSULE | Freq: Three times a day (TID) | ORAL | Status: DC
  Administered 2020-06-21 – 2020-06-24 (×8): 200 mg via ORAL
  Filled 2020-06-21 (×9): qty 2

## 2020-06-21 MED ORDER — TRAMADOL HCL 50 MG PO TABS: 50.0000 mg | ORAL_TABLET | Freq: Four times a day (QID) | ORAL | 0 refills | Status: DC | PRN

## 2020-06-21 MED ORDER — LACTATED RINGERS IV SOLN: INTRAVENOUS | Status: DC | PRN

## 2020-06-21 MED ORDER — 0.9 % SODIUM CHLORIDE (POUR BTL) OPTIME
TOPICAL | Status: DC | PRN
  Administered 2020-06-21: 2000 mL

## 2020-06-21 MED ORDER — FENTANYL CITRATE (PF) 250 MCG/5ML IJ SOLN
INTRAMUSCULAR | Status: AC
  Filled 2020-06-21: qty 5

## 2020-06-21 MED ORDER — METRONIDAZOLE 500 MG PO TABS: 1000.0000 mg | ORAL_TABLET | ORAL | Status: DC

## 2020-06-21 MED ORDER — SODIUM CHLORIDE 0.9 % IV SOLN
2.0000 g | INTRAVENOUS | Status: AC
  Administered 2020-06-21: 2 g via INTRAVENOUS
  Filled 2020-06-21: qty 2

## 2020-06-21 MED ORDER — FENTANYL CITRATE (PF) 100 MCG/2ML IJ SOLN
INTRAMUSCULAR | Status: DC | PRN
  Administered 2020-06-21: 100 ug via INTRAVENOUS
  Administered 2020-06-21: 50 ug via INTRAVENOUS

## 2020-06-21 MED ORDER — ADULT MULTIVITAMIN W/MINERALS CH
1.0000 | ORAL_TABLET | Freq: Every day | ORAL | Status: DC
  Administered 2020-06-21 – 2020-06-24 (×4): 1 via ORAL
  Filled 2020-06-21 (×4): qty 1

## 2020-06-21 MED ORDER — METRONIDAZOLE 500 MG PO TABS
500.0000 mg | ORAL_TABLET | Freq: Every day | ORAL | Status: DC
  Administered 2020-06-21 – 2020-06-24 (×4): 500 mg via ORAL
  Filled 2020-06-21 (×4): qty 1

## 2020-06-21 MED ORDER — ENALAPRILAT 1.25 MG/ML IV SOLN
0.6250 mg | Freq: Four times a day (QID) | INTRAVENOUS | Status: DC | PRN
  Filled 2020-06-21: qty 1

## 2020-06-21 MED ORDER — ROCURONIUM BROMIDE 10 MG/ML (PF) SYRINGE
PREFILLED_SYRINGE | INTRAVENOUS | Status: DC | PRN
  Administered 2020-06-21: 80 mg via INTRAVENOUS
  Administered 2020-06-21: 10 mg via INTRAVENOUS

## 2020-06-21 MED ORDER — METHOCARBAMOL 1000 MG/10ML IJ SOLN
1000.0000 mg | Freq: Four times a day (QID) | INTRAVENOUS | Status: DC | PRN
  Filled 2020-06-21: qty 10

## 2020-06-21 MED ORDER — SUGAMMADEX SODIUM 200 MG/2ML IV SOLN
INTRAVENOUS | Status: DC | PRN
  Administered 2020-06-21: 200 mg via INTRAVENOUS

## 2020-06-21 MED ORDER — LACTATED RINGERS IV BOLUS
1000.0000 mL | Freq: Once | INTRAVENOUS | Status: AC
  Administered 2020-06-21: 23:00:00 1000 mL via INTRAVENOUS

## 2020-06-21 MED ORDER — ROCURONIUM BROMIDE 10 MG/ML (PF) SYRINGE
PREFILLED_SYRINGE | INTRAVENOUS | Status: AC
  Filled 2020-06-21: qty 30

## 2020-06-21 MED ORDER — METOPROLOL TARTRATE 5 MG/5ML IV SOLN: 5.0000 mg | Freq: Four times a day (QID) | INTRAVENOUS | Status: DC | PRN

## 2020-06-21 MED ORDER — SODIUM CHLORIDE 0.9 % IV SOLN: Freq: Three times a day (TID) | INTRAVENOUS | Status: DC | PRN

## 2020-06-21 MED ORDER — CHLORHEXIDINE GLUCONATE CLOTH 2 % EX PADS
6.0000 | MEDICATED_PAD | Freq: Every day | CUTANEOUS | Status: DC
  Administered 2020-06-23 – 2020-06-24 (×2): 6 via TOPICAL

## 2020-06-21 MED ORDER — DEXAMETHASONE SODIUM PHOSPHATE 10 MG/ML IJ SOLN
INTRAMUSCULAR | Status: DC | PRN
  Administered 2020-06-21: 5 mg via INTRAVENOUS

## 2020-06-21 MED ORDER — GABAPENTIN 300 MG PO CAPS
300.0000 mg | ORAL_CAPSULE | ORAL | Status: AC
  Administered 2020-06-21: 300 mg via ORAL
  Filled 2020-06-21: qty 1

## 2020-06-21 MED ORDER — DIPHENHYDRAMINE HCL 12.5 MG/5ML PO ELIX: 12.5000 mg | ORAL_SOLUTION | Freq: Four times a day (QID) | ORAL | Status: DC | PRN

## 2020-06-21 MED ORDER — PHENYLEPHRINE 40 MCG/ML (10ML) SYRINGE FOR IV PUSH (FOR BLOOD PRESSURE SUPPORT)
PREFILLED_SYRINGE | INTRAVENOUS | Status: DC | PRN
  Administered 2020-06-21 (×2): 80 ug via INTRAVENOUS
  Administered 2020-06-21: 120 ug via INTRAVENOUS
  Administered 2020-06-21: 80 ug via INTRAVENOUS

## 2020-06-21 MED ORDER — PANTOPRAZOLE SODIUM 40 MG PO TBEC
80.0000 mg | DELAYED_RELEASE_TABLET | Freq: Every day | ORAL | Status: DC
  Administered 2020-06-21 – 2020-06-24 (×4): 80 mg via ORAL
  Filled 2020-06-21 (×4): qty 2

## 2020-06-21 MED ORDER — EPHEDRINE SULFATE-NACL 50-0.9 MG/10ML-% IV SOSY
PREFILLED_SYRINGE | INTRAVENOUS | Status: DC | PRN
  Administered 2020-06-21: 15 mg via INTRAVENOUS
  Administered 2020-06-21: 10 mg via INTRAVENOUS

## 2020-06-21 MED ORDER — FENTANYL CITRATE (PF) 100 MCG/2ML IJ SOLN: 25.0000 ug | INTRAMUSCULAR | Status: DC | PRN

## 2020-06-21 MED ORDER — ENSURE PRE-SURGERY PO LIQD: 592.0000 mL | Freq: Once | ORAL | Status: DC

## 2020-06-21 MED ORDER — LIP MEDEX EX OINT
1.0000 "application " | TOPICAL_OINTMENT | Freq: Two times a day (BID) | CUTANEOUS | Status: DC
  Administered 2020-06-21 – 2020-06-23 (×4): 1 via TOPICAL
  Filled 2020-06-21 (×2): qty 7

## 2020-06-21 MED ORDER — SODIUM CHLORIDE 0.9 % IV SOLN: INTRAVENOUS | Status: DC

## 2020-06-21 MED ORDER — ENOXAPARIN SODIUM 40 MG/0.4ML ~~LOC~~ SOLN
40.0000 mg | Freq: Once | SUBCUTANEOUS | Status: AC
  Administered 2020-06-21: 40 mg via SUBCUTANEOUS
  Filled 2020-06-21: qty 0.4

## 2020-06-21 SURGICAL SUPPLY — 114 items
APL PRP STRL LF DISP 70% ISPRP (MISCELLANEOUS)
APPLIER CLIP 5 13 M/L LIGAMAX5 (MISCELLANEOUS)
APPLIER CLIP ROT 10 11.4 M/L (STAPLE)
APR CLP MED LRG 11.4X10 (STAPLE)
APR CLP MED LRG 5 ANG JAW (MISCELLANEOUS)
BLADE EXTENDED COATED 6.5IN (ELECTRODE) ×2 IMPLANT
CANNULA REDUC XI 12-8 STAPL (CANNULA)
CANNULA REDUC XI 12-8MM STAPL (CANNULA)
CANNULA REDUCER 12-8 DVNC XI (CANNULA) IMPLANT
CELLS DAT CNTRL 66122 CELL SVR (MISCELLANEOUS) IMPLANT
CHLORAPREP W/TINT 26 (MISCELLANEOUS) IMPLANT
CLIP APPLIE 5 13 M/L LIGAMAX5 (MISCELLANEOUS) IMPLANT
CLIP APPLIE ROT 10 11.4 M/L (STAPLE) IMPLANT
COVER SURGICAL LIGHT HANDLE (MISCELLANEOUS) ×8 IMPLANT
COVER TIP SHEARS 8 DVNC (MISCELLANEOUS) ×2 IMPLANT
COVER TIP SHEARS 8MM DA VINCI (MISCELLANEOUS) ×4
COVER WAND RF STERILE (DRAPES) ×4 IMPLANT
DEVICE TROCAR PUNCTURE CLOSURE (ENDOMECHANICALS) IMPLANT
DRAIN CHANNEL 19F RND (DRAIN) IMPLANT
DRAPE ARM DVNC X/XI (DISPOSABLE) ×8 IMPLANT
DRAPE COLUMN DVNC XI (DISPOSABLE) ×2 IMPLANT
DRAPE DA VINCI XI ARM (DISPOSABLE) ×16
DRAPE DA VINCI XI COLUMN (DISPOSABLE) ×4
DRAPE SURG IRRIG POUCH 19X23 (DRAPES) ×4 IMPLANT
DRSG OPSITE POSTOP 4X10 (GAUZE/BANDAGES/DRESSINGS) IMPLANT
DRSG OPSITE POSTOP 4X6 (GAUZE/BANDAGES/DRESSINGS) ×3 IMPLANT
DRSG OPSITE POSTOP 4X8 (GAUZE/BANDAGES/DRESSINGS) IMPLANT
DRSG TEGADERM 2-3/8X2-3/4 SM (GAUZE/BANDAGES/DRESSINGS) ×18 IMPLANT
DRSG TEGADERM 4X4.75 (GAUZE/BANDAGES/DRESSINGS) ×2 IMPLANT
ELECT PENCIL ROCKER SW 15FT (MISCELLANEOUS) ×4 IMPLANT
ELECT REM PT RETURN 15FT ADLT (MISCELLANEOUS) ×4 IMPLANT
ENDOLOOP SUT PDS II  0 18 (SUTURE)
ENDOLOOP SUT PDS II 0 18 (SUTURE) IMPLANT
EVACUATOR SILICONE 100CC (DRAIN) IMPLANT
GAUZE SPONGE 2X2 8PLY STRL LF (GAUZE/BANDAGES/DRESSINGS) ×2 IMPLANT
GLOVE ECLIPSE 8.0 STRL XLNG CF (GLOVE) ×12 IMPLANT
GLOVE INDICATOR 8.0 STRL GRN (GLOVE) ×12 IMPLANT
GOWN STRL REUS W/TWL XL LVL3 (GOWN DISPOSABLE) ×12 IMPLANT
GRASPER SUT TROCAR 14GX15 (MISCELLANEOUS) IMPLANT
HOLDER FOLEY CATH W/STRAP (MISCELLANEOUS) ×4 IMPLANT
IRRIG SUCT STRYKERFLOW 2 WTIP (MISCELLANEOUS) ×4
IRRIGATION SUCT STRKRFLW 2 WTP (MISCELLANEOUS) ×2 IMPLANT
KIT PROCEDURE DA VINCI SI (MISCELLANEOUS) ×4
KIT PROCEDURE DVNC SI (MISCELLANEOUS) IMPLANT
KIT SIGMOIDOSCOPE (SET/KITS/TRAYS/PACK) ×2 IMPLANT
KIT TURNOVER KIT A (KITS) ×2 IMPLANT
NDL INSUFFLATION 14GA 120MM (NEEDLE) ×2 IMPLANT
NEEDLE INSUFFLATION 14GA 120MM (NEEDLE) ×4 IMPLANT
PACK CARDIOVASCULAR III (CUSTOM PROCEDURE TRAY) ×4 IMPLANT
PACK COLON (CUSTOM PROCEDURE TRAY) ×4 IMPLANT
PAD POSITIONING PINK XL (MISCELLANEOUS) ×4 IMPLANT
PORT LAP GEL ALEXIS MED 5-9CM (MISCELLANEOUS) ×4 IMPLANT
PROTECTOR NERVE ULNAR (MISCELLANEOUS) ×8 IMPLANT
RELOAD STAPLE 45 3.5 BLU DVNC (STAPLE) IMPLANT
RELOAD STAPLE 45 4.3 GRN DVNC (STAPLE) IMPLANT
RELOAD STAPLE 60 3.5 BLU DVNC (STAPLE) IMPLANT
RELOAD STAPLE 60 4.3 GRN DVNC (STAPLE) IMPLANT
RELOAD STAPLER 3.5X45 BLU DVNC (STAPLE) IMPLANT
RELOAD STAPLER 3.5X60 BLU DVNC (STAPLE) ×4 IMPLANT
RELOAD STAPLER 4.3X45 GRN DVNC (STAPLE) IMPLANT
RELOAD STAPLER 4.3X60 GRN DVNC (STAPLE) IMPLANT
RETRACTOR WND ALEXIS 18 MED (MISCELLANEOUS) IMPLANT
RTRCTR WOUND ALEXIS 18CM MED (MISCELLANEOUS)
SCISSORS LAP 5X35 DISP (ENDOMECHANICALS) ×1 IMPLANT
SEAL CANN UNIV 5-8 DVNC XI (MISCELLANEOUS) ×6 IMPLANT
SEAL XI 5MM-8MM UNIVERSAL (MISCELLANEOUS) ×16
SEALER VESSEL DA VINCI XI (MISCELLANEOUS) ×4
SEALER VESSEL EXT DVNC XI (MISCELLANEOUS) ×2 IMPLANT
SOLUTION ELECTROLUBE (MISCELLANEOUS) ×4 IMPLANT
SPONGE GAUZE 2X2 STER 10/PKG (GAUZE/BANDAGES/DRESSINGS) ×2
STAPLER 45 DA VINCI SURE FORM (STAPLE)
STAPLER 45 SUREFORM DVNC (STAPLE) IMPLANT
STAPLER 60 DA VINCI SURE FORM (STAPLE) ×4
STAPLER 60 SUREFORM DVNC (STAPLE) IMPLANT
STAPLER CANNULA SEAL DVNC XI (STAPLE) ×2 IMPLANT
STAPLER CANNULA SEAL XI (STAPLE) ×4
STAPLER ECHELON POWER CIR 29 (STAPLE) IMPLANT
STAPLER ECHELON POWER CIR 31 (STAPLE) ×2 IMPLANT
STAPLER RELOAD 3.5X45 BLU DVNC (STAPLE)
STAPLER RELOAD 3.5X45 BLUE (STAPLE)
STAPLER RELOAD 3.5X60 BLU DVNC (STAPLE) ×4
STAPLER RELOAD 3.5X60 BLUE (STAPLE) ×8
STAPLER RELOAD 4.3X45 GREEN (STAPLE)
STAPLER RELOAD 4.3X45 GRN DVNC (STAPLE)
STAPLER RELOAD 4.3X60 GREEN (STAPLE)
STAPLER RELOAD 4.3X60 GRN DVNC (STAPLE)
STOPCOCK 4 WAY LG BORE MALE ST (IV SETS) ×8 IMPLANT
SURGILUBE 2OZ TUBE FLIPTOP (MISCELLANEOUS) ×2 IMPLANT
SUT MNCRL AB 4-0 PS2 18 (SUTURE) ×4 IMPLANT
SUT PDS AB 1 CT1 27 (SUTURE) ×8 IMPLANT
SUT PROLENE 0 CT 2 (SUTURE) IMPLANT
SUT PROLENE 2 0 KS (SUTURE) ×2 IMPLANT
SUT PROLENE 2 0 SH DA (SUTURE) IMPLANT
SUT SILK 2 0 (SUTURE)
SUT SILK 2 0 SH CR/8 (SUTURE) IMPLANT
SUT SILK 2-0 18XBRD TIE 12 (SUTURE) IMPLANT
SUT SILK 3 0 (SUTURE)
SUT SILK 3 0 SH CR/8 (SUTURE) ×4 IMPLANT
SUT SILK 3-0 18XBRD TIE 12 (SUTURE) IMPLANT
SUT V-LOC BARB 180 2/0GR6 GS22 (SUTURE)
SUT VIC AB 3-0 SH 18 (SUTURE) IMPLANT
SUT VIC AB 3-0 SH 27 (SUTURE)
SUT VIC AB 3-0 SH 27XBRD (SUTURE) IMPLANT
SUT VICRYL 0 UR6 27IN ABS (SUTURE) ×4 IMPLANT
SUTURE V-LC BRB 180 2/0GR6GS22 (SUTURE) IMPLANT
SYR 10ML ECCENTRIC (SYRINGE) ×4 IMPLANT
SYS LAPSCP GELPORT 120MM (MISCELLANEOUS)
SYSTEM LAPSCP GELPORT 120MM (MISCELLANEOUS) IMPLANT
TOWEL OR NON WOVEN STRL DISP B (DISPOSABLE) ×4 IMPLANT
TRAY FOLEY MTR SLVR 16FR STAT (SET/KITS/TRAYS/PACK) ×4 IMPLANT
TROCAR ADV FIXATION 5X100MM (TROCAR) ×4 IMPLANT
TUBING CONNECTING 10 (TUBING) ×6 IMPLANT
TUBING CONNECTING 10' (TUBING) ×2
TUBING INSUFFLATION 10FT LAP (TUBING) ×4 IMPLANT

## 2020-06-21 NOTE — Significant Event (Signed)
Rapid Response Event Note   Reason for Call :  Received call from Bedside nurse on [facility] (room 1308) that patient hypotensive, bradycardic, and c/o dizziness and feeling faint. Bedside nurse reported SBP in 90's and HR 50's where it had previously been SBP 130's and HR 70's.  Initial Focused Assessment:  Patient is s/p Lap colon resection and proctoscopy w/o obvious external bleeding. Patient is pale, alert, oriented, and hypotensive. Initial blood pressure is 64/42 MAP 50, HR 50's. Patient blood glucose 168. Patient continues to report slight dizziness, but remains responsive, alert, and oriented. Established that patient has not been given any opioids, beta blockers, or heart meds since surgery.    Interventions:  EKG and CBG done; vitals and assessment completed. Contacted attending physician, Dr. Johney Maine. Per rapid response standing orders, gave 500 mL NS bolus. Dr. Johney Maine also ordered 1 L bolus of LR and this was started. Labs ordered per Dr. Johney Maine.   Plan of Care:  BP improved while bolus running, but concerned that BP may drop again. Therefore, RN obtained transfer orders to bring pt to stepdown room 1223. Bedside RN called report to Lauren and assisted with transport to room 1223.    Event Summary:  Patient transferred to ICU/SD room 1223 for closer monitoring. Called patient's wife at 23 to inform of transfer and provide update. MD Notified:  Call Time: [place] Time: 2220 End Time: [place], RN

## 2020-06-21 NOTE — Anesthesia Procedure Notes (Signed)
Procedure Name: Intubation Performed by: Gean Maidens, CRNA Pre-anesthesia Checklist: Patient identified, Emergency Drugs available, Suction available, Patient being monitored and Timeout performed Patient Re-evaluated:Patient Re-evaluated prior to induction Oxygen Delivery Method: Circle system utilized Preoxygenation: Pre-oxygenation with 100% oxygen Induction Type: IV induction Ventilation: Mask ventilation without difficulty Laryngoscope Size: Mac and 4 Grade View: Grade I Tube type: Oral Tube size: 7.5 mm Number of attempts: 1 Airway Equipment and Method: Stylet Placement Confirmation: ETT inserted through vocal cords under direct vision,  positive ETCO2 and breath sounds checked- equal and bilateral Secured at: 23 cm Tube secured with: Tape Dental Injury: Teeth and Oropharynx as per pre-operative assessment

## 2020-06-21 NOTE — Interval H&P Note (Signed)
History and Physical Interval Note:  06/21/2020 10:49 AM  Nathan [name]  has presented today for surgery, with the diagnosis of RECTAL CANCER.  The various methods of treatment have been discussed with the patient and family. After consideration of risks, benefits and other options for treatment, the patient has consented to  Procedure(s): XI ROBOTIC ASSISTED LOWER ANTERIOR RESECTION (N/A) POSSIBLE OSTOMY (N/A) RIGID PROCTOSCOPY (N/A) as a surgical intervention.  The patient's history has been reviewed, patient examined, no change in status, stable for surgery.  I have reviewed the patient's chart and labs.  Questions were answered to the patient's satisfaction.    I have re-reviewed the the patient's records, history, medications, and allergies.  I have re-examined the patient.  I again discussed intraoperative plans and goals of post-operative recovery.  The patient agrees to proceed.  Nathan [name]  [birth year] [id number]  Patient Care Team: Ma Hillock, DO as PCP - General (Family Medicine) Pa, [facility] Leandrew Koyanagi, MD as Attending Physician (Orthopedic Surgery) Jonnie Finner, RN as Oncology Nurse Navigator Ladell Pier, MD as Consulting Physician (Oncology) Michael Boston, MD as Consulting Physician (General Surgery) Richardo Priest, MD as Consulting Physician (Cardiology)  Patient Active Problem List   Diagnosis Date Noted   Refusal of blood transfusions as patient is Jehovah's Witness 06/19/2020    Priority: Medium   AIN grade I 05/20/2020   Abnormal colonoscopy 05/20/2020   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 05/20/2020   Rectal polyp 05/20/2020   Hoarseness 02/07/2020   Right upper quadrant abdominal pain 02/07/2020   Epigastric pain 02/07/2020   Urinary frequency 02/07/2020   Other fatigue 02/07/2020   Anemia 02/07/2020   Fecal occult blood test positive 02/07/2020   Hyperthyroidism 02/04/2020   Abnormal echocardiogram 02/05/2019    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 reports it dropped to zero- post op MORPHINE , then taken ICU due to either the morphine or hypoglycemia . In addition post op from cataract surgery- 07/2015, experienced an episode of hypoglycemia     Depressive disorder, not elsewhere classified    Diverticulosis    Elevated prostate specific antigen (PSA) 2008   Dr Jeffie Pollock   Gallstone    GERD (gastroesophageal reflux disease)    H/O cardiovascular stress test >10 yrs.   told that its wnl. Told to drink less coffee   Hematuria 2018   Hematuria with enlarged prostate, kidney cyst, adrenal adenoma--> followed by urology with cystoscopy and repeat CT, benign workup.   Hx of migraines    Hypertension    Hyperthyroidism    Treated with medication no longer needed at this time   Hypoglycemia    IBS (irritable bowel syndrome)    patient not aware   Inguinal hernia 12/2015   Bilateral small indirect hernias on CT, umbilical hernia also present.   Kidney cyst, acquired  2018   Followed by urology, benign.   Low back pain    Macular degeneration (senile) of retina, unspecified    2 different opinions from 2 different Ophth   Osteoarthrosis, unspecified whether generalized or localized, unspecified site    Other and unspecified hyperlipidemia    Prostatitis 2008   Rectal cancer ([place])    Refusal of blood transfusions as patient is  Jehovah's Witness    Segmental and somatic dysfunction of cervical region    Segmental and somatic dysfunction of lumbar region    Segmental and somatic dysfunction of sacral region    Segmental and somatic dysfunction of thoracic region    Spondylosis of lumbar spine    Strain of pelvis    Tuberculosis    had exposure as child tests positive on skin test.    Past Surgical History:  Procedure Laterality Date   BIOPSY  05/24/2020   Procedure: BIOPSY;  Surgeon: Irving Copas., MD;  Location: [facility];  Service: Gastroenterology;;   CATARACT EXTRACTION, BILATERAL     COLONOSCOPY  2012   Dr Olevia Perches   COLONOSCOPY WITH PROPOFOL N/A 05/24/2020   Procedure: COLONOSCOPY WITH PROPOFOL;  Surgeon: Irving Copas., MD;  Location: [place];  Service: Gastroenterology;  Laterality: N/A;   CYSTOSCOPY     for hematuria   EUS N/A 05/24/2020   Procedure: LOWER ENDOSCOPIC ULTRASOUND (EUS);  Surgeon: Irving Copas., MD;  Location: [place];  Service: Gastroenterology;  Laterality: N/A;   hydrocoelectomy  2003   Dr Jeffie Pollock   KNEE ARTHROSCOPY Left 1994   POLYPECTOMY  05/24/2020   Procedure: POLYPECTOMY;  Surgeon: Rush Landmark Telford Nab., MD;  Location: [place];  Service: Gastroenterology;;   prostate nodule resection  2003   TONSILLECTOMY     TOTAL KNEE ARTHROPLASTY  2009   TOTAL KNEE ARTHROPLASTY Right 06/27/2016   Procedure: TOTAL KNEE ARTHROPLASTY;  Surgeon: Leandrew Koyanagi, MD;  Location: [place];  Service: Orthopedics;  Laterality: Right;    Social History   Socioeconomic History   Marital status: Married    Spouse name: Not on file   Number of children: 4   Years of education: Not on file   Highest education level: Not on file  Occupational History   Not on file  Tobacco Use   Smoking status: Former Smoker    Quit date: 07/15/1968    Years since quitting: 51.9   Smokeless tobacco: Never Used   Tobacco comment: smoked [place], up to 1ppd  Vaping  Use   Vaping Use: Never used  Substance and Sexual Activity   Alcohol use: Not Currently    Comment: on occasion   Drug use: No   Sexual activity: Yes    Partners: Female    Birth control/protection: None  Other Topics Concern   Not on file  Social History Narrative   Married. Wife's name is [name]. Full time employed, Pensions consultant.   Drinks caffeinated beverages. Take a multivitamin. Wears his seatbelt.   Exercises at least 3 times a week. Is not a strict vegetarian, but only eats meat 1-2 times a week   Wears a hearing aid. Does not wear dentures or use an assistive walking device.   There is a smoke detector in his home.   He feels safe in his relationships.   Social Determinants of Health   Financial Resource Strain:    Difficulty of Paying Living Expenses: Not on file  Food Insecurity:    Worried  About Running Out of Food in the Last Year: Not on file   Ran Out of Food in the Last Year: Not on file  Transportation Needs:    Lack of Transportation (Medical): Not on file   Lack of Transportation (Non-Medical): Not on file  Physical Activity:    Days of Exercise per Week: Not on file   Minutes of Exercise per Session: Not on file  Stress:    Feeling of Stress : Not on file  Social Connections:    Frequency of Communication with Friends and Family: Not on file   Frequency of Social Gatherings with Friends and Family: Not on file   Attends Religious Services: Not on file   Active Member of Clubs or Organizations: Not on file   Attends Archivist Meetings: Not on file   Marital Status: Not on file  Intimate Partner Violence:    Fear of Current or Ex-Partner: Not on file   Emotionally Abused: Not on file   Physically Abused: Not on file   Sexually Abused: Not on file    Family History  Problem Relation Age of Onset   Lung cancer Father        smoker   Alcohol abuse Father    Tuberculosis Father    Heart disease Father    Early death Father     Diabetes Other    Heart disease Mother    Heart attack Brother    Stroke Neg Hx    Thyroid disease Neg Hx    Colon cancer Neg Hx    Stomach cancer Neg Hx    Rectal cancer Neg Hx    Esophageal cancer Neg Hx    Inflammatory bowel disease Neg Hx    Liver disease Neg Hx    Pancreatic cancer Neg Hx     Medications Prior to Admission  Medication Sig Dispense Refill Last Dose   amLODipine (NORVASC) 10 MG tablet Take 1 tablet (10 mg total) by mouth daily. 90 tablet 1 06/20/2020 at 0800   b complex vitamins tablet Take 1 tablet by mouth every other day.   06/19/2020   Doxylamine Succinate, Sleep, (SLEEP AID PO) Take 1 tablet by mouth at bedtime. herbalife   06/20/2020 at 2100   fluticasone (FLONASE) 50 MCG/ACT nasal spray SPRAY 2 SPRAYS INTO EACH NOSTRIL EVERY DAY (Patient taking differently: Place 2 sprays into both nostrils daily. ) 16 mL 5 06/21/2020 at Unknown time   lisinopril (ZESTRIL) 30 MG tablet Take 1 tablet (30 mg total) by mouth daily. Needs office visit prior to any additional refills (Patient taking differently: Take 30 mg by mouth daily. ) 90 tablet 1 06/20/2020 at 0800   metroNIDAZOLE (FLAGYL) 500 MG tablet Take 500 mg by mouth daily.   06/20/2020 at 2130   Misc Natural Products (BRAINSTRONG MEMORY SUPPORT PO) Take 2 tablets by mouth daily.   06/19/2020   Multiple Vitamin tablet Take 1 tablet by mouth daily.    06/19/2020   neomycin (MYCIFRADIN) 500 MG tablet Take 1,000 mg by mouth 3 (three) times daily.   06/20/2020 at 2130   omeprazole (PRILOSEC) 20 MG capsule TAKE 1 CAPSULE BY MOUTH TWICE A DAY (Patient taking differently: Take 20 mg by mouth 2 (two) times daily before a meal. ) 180 capsule 1 06/20/2020 at 0800   Polyethylene Glycol 3350 (MIRALAX PO) Take 17 g by mouth daily.   06/20/2020 at 1400   Probiotic Product (PROBIOTIC-10 PO) Take 1 tablet by mouth daily.  06/19/2020   Valerian 500 MG CAPS Take 1,000 mg by mouth at bedtime.   06/20/2020 at 2130    Current Facility-Administered  Medications  Medication Dose Route Frequency Provider Last Rate Last Admin   bisacodyl (DULCOLAX) EC tablet 20 mg  20 mg Oral Once Michael Boston, MD       bupivacaine liposome (EXPAREL) 1.3 % injection 266 mg  20 mL Infiltration Once Michael Boston, MD       cefoTEtan (CEFOTAN) 2 g in sodium chloride 0.9 % 100 mL IVPB  2 g Intravenous On Call to OR Michael Boston, MD       Chlorhexidine Gluconate Cloth 2 % PADS 6 each  6 each Topical Once Michael Boston, MD       And   Chlorhexidine Gluconate Cloth 2 % PADS 6 each  6 each Topical Once Michael Boston, MD       Derrill Memo ON 06/22/2020] feeding supplement (ENSURE PRE-SURGERY) liquid 296 mL  296 mL Oral Once Michael Boston, MD       feeding supplement (ENSURE PRE-SURGERY) liquid 592 mL  592 mL Oral Once Michael Boston, MD       lactated ringers infusion   Intravenous Continuous Ellender, Karyl Kinnier, MD 10 mL/hr at 06/21/20 1020 New Bag at 06/21/20 1020   neomycin (MYCIFRADIN) tablet 1,000 mg  1,000 mg Oral 3 times per day Michael Boston, MD       And   metroNIDAZOLE (FLAGYL) tablet 1,000 mg  1,000 mg Oral 3 times per day Michael Boston, MD       polyethylene glycol powder (GLYCOLAX/MIRALAX) container 255 g  1 Container Oral Once Michael Boston, MD         Allergies  Allergen Reactions   Morphine And Related Other (See Comments)    Severe drop in blood pressure   Citalopram Hydrobromide Other (See Comments)    PATIENT PREFERENCE: "Just ineffective"   Sertraline Hcl Other (See Comments)    Headache    BP (!) 174/87   Pulse 72   Temp 98.5 F (36.9 C) (Oral)   Resp 18   Ht 5\' 10"  (1.778 m)   Wt 80.6 kg   SpO2 98%   BMI 25.48 kg/m   Labs: Results for orders placed or performed during the hospital encounter of 06/21/20 (from the past 48 hour(s))  Comprehensive metabolic panel per protocol     Status: Abnormal   Collection Time: 06/21/20 10:17 AM  Result Value Ref Range   Sodium 138 135 - 145 mmol/L   Potassium 3.4 (L) 3.5 - 5.1 mmol/L   Chloride  104 98 - 111 mmol/L   CO2 22 22 - 32 mmol/L   Glucose, Bld 97 70 - 99 mg/dL    Comment: Glucose reference range applies only to samples taken after fasting for at least 8 hours.   BUN 10 8 - 23 mg/dL   Creatinine, Ser 1.15 0.61 - 1.24 mg/dL   Calcium 9.4 8.9 - 10.3 mg/dL   Total Protein 7.7 6.5 - 8.1 g/dL   Albumin 4.6 3.5 - 5.0 g/dL   AST 27 15 - 41 U/L   ALT 21 0 - 44 U/L   Alkaline Phosphatase 76 38 - 126 U/L   Total Bilirubin 0.9 0.3 - 1.2 mg/dL   GFR, Estimated >60 >60 mL/min    Comment: (NOTE) Calculated using the CKD-EPI Creatinine Equation (2021)    Anion gap 12 5 - 15    Comment: Performed at [facility], [place] [address], [place], [place] [address]  CBC per protocol     Status: None   Collection Time: 06/21/20 10:17 AM  Result Value Ref Range   WBC 7.6 4.0 - 10.5 K/uL   RBC 5.21 4.22 - 5.81 MIL/uL   Hemoglobin 15.5 13.0 - 17.0 g/dL   HCT 46.2 39 - 52 %   MCV 88.7 80.0 - 100.0 fL   MCH 29.8 26.0 - 34.0 pg   MCHC 33.5 30.0 - 36.0 g/dL   RDW 13.9 11.5 - 15.5 %   Platelets 304 150 - 400 K/uL   nRBC 0.0 0.0 - 0.2 %    Comment: Performed at [facility], [place] [address], [place], [place] [address]    Imaging / Studies: CT CHEST W CONTRAST  Result Date: 06/06/2020 CLINICAL DATA:  73 year old male with colorectal cancer staging. EXAM: CT CHEST, ABDOMEN, AND PELVIS WITH CONTRAST TECHNIQUE: Multidetector CT imaging of the chest, abdomen and pelvis was performed following the standard protocol during bolus administration of intravenous contrast. CONTRAST:  115mL OMNIPAQUE IOHEXOL 300 MG/ML  SOLN COMPARISON:  Pelvic MRI dated 05/31/2020 and CT abdomen pelvis dated 02/07/2020. FINDINGS: CT CHEST FINDINGS Cardiovascular: There is no cardiomegaly or pericardial effusion. There is coronary vascular calcification. Mild atherosclerotic calcification of the thoracic aorta. No aneurysmal dilatation or dissection. The central pulmonary arteries appear  patent. Mediastinum/Nodes: No hilar or mediastinal adenopathy. The esophagus and the thyroid gland are grossly unremarkable. No mediastinal fluid collection. Lungs/Pleura: Mild paraseptal emphysema. Musculoskeletal: No chest wall mass or suspicious bone lesions identified. CT ABDOMEN PELVIS FINDINGS No intra-abdominal free air or free fluid. Hepatobiliary: The liver is unremarkable. No intrahepatic biliary dilatation. Small gallstones. No pericholecystic fluid or evidence of acute cholecystitis by CT. Pancreas: Unremarkable. No pancreatic ductal dilatation or surrounding inflammatory changes. Spleen: Normal in size without focal abnormality. Adrenals/Urinary Tract: The left adrenal gland is unremarkable. There is a 15 mm right adrenal hypodense lesion which is similar to prior CT and indeterminate. This is however stable dating back to 2017 and most likely a benign etiology such as adenoma. There is a 1 cm right renal inferior pole cyst. There is no hydronephrosis on either side. There is symmetric enhancement and excretion of contrast by both kidneys. The visualized ureters appear unremarkable. The urinary bladder is partially distended and grossly unremarkable. Stomach/Bowel: There is a 1.9 x 1.5 cm ill-defined area of nodular thickening in the rectum (115/2) which may correspond to the rectal mass seen on the MRI. There is sigmoid diverticulosis without active inflammatory changes. There is moderate stool throughout the colon. There is no bowel obstruction or active inflammation. The appendix is normal. Vascular/Lymphatic: Moderate aortoiliac atherosclerotic disease. The IVC is unremarkable. No portal venous gas. There is no adenopathy. No abnormal perirectal lymph nodes. Reproductive: Mildly enlarged prostate gland measuring approximately 6 cm in transverse axial diameter. The seminal vesicles are symmetric. Other: Small fat containing umbilical hernia. Musculoskeletal: Degenerative changes of the spine. No  acute osseous pathology. IMPRESSION: 1. Ill-defined area of nodular thickening in the rectum may correspond to the rectal mass seen on the MRI. No evidence of metastatic disease in the chest, abdomen, or pelvis. 2. Sigmoid diverticulosis. No bowel obstruction. Normal appendix. 3. Cholelithiasis. 4. Stable right adrenal hypodense lesion, most likely a benign etiology such as adenoma. 5. Aortic Atherosclerosis (ICD10-I70.0) and Emphysema (ICD10-J43.9). Electronically Signed   By: Anner Crete M.D.   On: 06/06/2020 22:59   MR PELVIS WO CONTRAST  Result  Date: 05/31/2020 CLINICAL DATA:  History of neoplasm discovered on endoscopy in this 74 year old male, found to have tubular adenoma with marked high-grade dysplasia and focal intramucosal adenocarcinoma. EXAM: MRI PELVIS WITHOUT CONTRAST TECHNIQUE: Multiplanar multisequence MR imaging of the pelvis was performed. No intravenous contrast was administered. Small amount of [place] gel was administered per rectum to optimize tumor evaluation. COMPARISON:  CT abdomen and pelvis of February 07, 2020 FINDINGS: TUMOR LOCATION Tumor distance from Anal Verge/Skin Surface: 13 cm (discrete lesion is difficult to visualize. There is collapse of the rectosigmoid colon approximately 10 cm from the upper margin of the sphincter complex with suggestion of mild thickening in this area. This is at or above the anterior peritoneal reflection. Discrete thickening of the rectum below this level is not seen on small field-of-view images. In the axial plane on image 21 of series 10 is a small area measuring approximately 12 by 11 mm along the wall of the colon though collapse of the rectum and colon in this location and tortuosity does limit assessment. Tumor distance to Internal Anal Sphincter: 10 cm TUMOR DESCRIPTION Circumferential Extent: Suspected nodular eccentric lesion at the rectosigmoid transition/high rectum just above the APR. Area difficult to evaluate given collapse of the  rectosigmoid transition. Tumor Length: Approximately 1 cm T - CATEGORY Extension through Muscularis Propria: No Shortest Distance of any tumor/node from Mesorectal Fascia: Tumor appears to be located just at or above the anterior peritoneal reflection without extension beyond the muscularis. Lymph node approximately 2 mm from the upper margin of the mesorectum on image 27 of series 12 measures approximately 5-6 mm. Extramural Vascular Invasion/Tumor Thrombus: No Invasion of Anterior Peritoneal Reflection: No Involvement of Adjacent Organs or Pelvic Sidewall: No Levator Ani Involvement: No N - CATEGORY Mesorectal Lymph Nodes >=34mm: (Image 27, series 12) 5-6 mm lymph node approximately 2 mm from the posterior margin of the mesorectum. Other lymph nodes less than 5 mm in this location. N1 Extra-mesorectal Lymphadenopathy: No Other:  None. IMPRESSION: Rectal adenocarcinoma T stage: Difficult to stage though there is no extra rectal or colonic extension. Collapsed rectosigmoid likely in the area of the tumor with subtle abnormality that may correspond to the lesion as described. Rectal adenocarcinoma N stage:  Suspected N1 Distance from tumor to the internal anal sphincter is 9-10 cm. Electronically Signed   By: Zetta Bills M.D.   On: 05/31/2020 10:17   CT Abdomen Pelvis W Contrast  Result Date: 06/06/2020 CLINICAL DATA:  74 year old male with colorectal cancer staging. EXAM: CT CHEST, ABDOMEN, AND PELVIS WITH CONTRAST TECHNIQUE: Multidetector CT imaging of the chest, abdomen and pelvis was performed following the standard protocol during bolus administration of intravenous contrast. CONTRAST:  177mL OMNIPAQUE IOHEXOL 300 MG/ML  SOLN COMPARISON:  Pelvic MRI dated 05/31/2020 and CT abdomen pelvis dated 02/07/2020. FINDINGS: CT CHEST FINDINGS Cardiovascular: There is no cardiomegaly or pericardial effusion. There is coronary vascular calcification. Mild atherosclerotic calcification of the thoracic aorta. No  aneurysmal dilatation or dissection. The central pulmonary arteries appear patent. Mediastinum/Nodes: No hilar or mediastinal adenopathy. The esophagus and the thyroid gland are grossly unremarkable. No mediastinal fluid collection. Lungs/Pleura: Mild paraseptal emphysema. Musculoskeletal: No chest wall mass or suspicious bone lesions identified. CT ABDOMEN PELVIS FINDINGS No intra-abdominal free air or free fluid. Hepatobiliary: The liver is unremarkable. No intrahepatic biliary dilatation. Small gallstones. No pericholecystic fluid or evidence of acute cholecystitis by CT. Pancreas: Unremarkable. No pancreatic ductal dilatation or surrounding inflammatory changes. Spleen: Normal in size without focal abnormality. Adrenals/Urinary  Tract: The left adrenal gland is unremarkable. There is a 15 mm right adrenal hypodense lesion which is similar to prior CT and indeterminate. This is however stable dating back to 2017 and most likely a benign etiology such as adenoma. There is a 1 cm right renal inferior pole cyst. There is no hydronephrosis on either side. There is symmetric enhancement and excretion of contrast by both kidneys. The visualized ureters appear unremarkable. The urinary bladder is partially distended and grossly unremarkable. Stomach/Bowel: There is a 1.9 x 1.5 cm ill-defined area of nodular thickening in the rectum (115/2) which may correspond to the rectal mass seen on the MRI. There is sigmoid diverticulosis without active inflammatory changes. There is moderate stool throughout the colon. There is no bowel obstruction or active inflammation. The appendix is normal. Vascular/Lymphatic: Moderate aortoiliac atherosclerotic disease. The IVC is unremarkable. No portal venous gas. There is no adenopathy. No abnormal perirectal lymph nodes. Reproductive: Mildly enlarged prostate gland measuring approximately 6 cm in transverse axial diameter. The seminal vesicles are symmetric. Other: Small fat containing  umbilical hernia. Musculoskeletal: Degenerative changes of the spine. No acute osseous pathology. IMPRESSION: 1. Ill-defined area of nodular thickening in the rectum may correspond to the rectal mass seen on the MRI. No evidence of metastatic disease in the chest, abdomen, or pelvis. 2. Sigmoid diverticulosis. No bowel obstruction. Normal appendix. 3. Cholelithiasis. 4. Stable right adrenal hypodense lesion, most likely a benign etiology such as adenoma. 5. Aortic Atherosclerosis (ICD10-I70.0) and Emphysema (ICD10-J43.9). Electronically Signed   By: Anner Crete M.D.   On: 06/06/2020 22:59     .Adin Hector, M.D., F.A.C.S. Gastrointestinal and Minimally Invasive Surgery [facility] [address] [place], [place] [address] [phone] Main / Paging  06/21/2020 10:49 AM    Adin Hector

## 2020-06-21 NOTE — Transfer of Care (Signed)
Immediate Anesthesia Transfer of Care Note  Patient: Nathan [name]  Procedure(s) Performed: XI ROBOTIC ASSISTED LOWER ANTERIOR RECTOSIGMOID RESECTION, BILATERAL TAP BLOCK (N/A Abdomen) POSSIBLE OSTOMY (N/A ) RIGID PROCTOSCOPY (N/A )  Patient Location: PACU  Anesthesia Type:General  Level of Consciousness: sedated, patient cooperative and responds to stimulation  Airway & Oxygen Therapy: Patient Spontanous Breathing and Patient connected to face mask oxygen  Post-op Assessment: Report given to RN and Post -op Vital signs reviewed and stable  Post vital signs: Reviewed and stable  Last Vitals:  Vitals Value Taken Time  BP 140/54 06/21/20 1551  Temp    Pulse 68 06/21/20 1552  Resp 14 06/21/20 1553  SpO2 100 % 06/21/20 1552  Vitals shown include unvalidated device data.  Last Pain:  Vitals:   06/21/20 1022  TempSrc: Oral  PainSc:       Patients Stated Pain Goal: 3 (84/73/08 5694)  Complications: No complications documented.

## 2020-06-21 NOTE — Op Note (Signed)
06/21/2020  3:47 PM  PATIENT:  Nathan [name]  74 y.o. male  Patient Care Team: Ma Hillock, DO as PCP - General (Family Medicine) [place], [facility] Leandrew Koyanagi, MD as Attending Physician (Orthopedic Surgery) Jonnie Finner, RN as Oncology Nurse Navigator Ladell Pier, MD as Consulting Physician (Oncology) Michael Boston, MD as Consulting Physician (General Surgery) Richardo Priest, MD as Consulting Physician (Cardiology)  PRE-OPERATIVE DIAGNOSIS:  RECTAL CANCER  POST-OPERATIVE DIAGNOSIS:  RECTAL CANCER  PROCEDURE:  XI ROBOTIC ASSISTED LOWER ANTERIOR RECTOSIGMOID RESECTION Assessment of tissue perfusion using firefly immunofluorescence BILATERAL TAP BLOCK RIGID PROCTOSCOPY  SURGEON:  Adin Hector, MD  ASSISTANT: Leighton Ruff, MD, FACS.    ANESTHESIA:     General  Nerve block provided with liposomal bupivacaine (Experel) mixed with 0.25% bupivacaine as a Bilateral TAP block x 90mL each side at the level of the transverse abdominis & preperitoneal spaces along the flank at the anterior axillary line, from subcostal ridge to iliac crest under laparoscopic guidance   Local field block at port sites & extraction wound  EBL:  Total I/O In: 2600 [I.V.:2500; IV Piggyback:100] Out: 400 [Urine:300; Blood:100]  Delay start of Pharmacological VTE agent (>24hrs) due to surgical blood loss or risk of bleeding:  no  DRAINS: 19 Fr Blake drain goes to the pelvis  SPECIMEN:   RECTOSIGMOID COLON (open end proximal) DISTAL ANASTOMOTIC RING (final distal margin)  DISPOSITION OF SPECIMEN:  PATHOLOGY  COUNTS:  YES  PLAN OF CARE: Admit to inpatient   PATIENT DISPOSITION:  PACU - hemodynamically stable.  INDICATION:    Pleasant gentleman found to have polyps and suspicious mass in the colon in proximal rectum.  MRI and endoscopic ultrasound not able to stage it but seemed early-stage Tis versus T1.  I recommended segmental resection:  The anatomy &  physiology of the digestive tract was discussed.  The pathophysiology was discussed.  Natural history risks without surgery was discussed.   I worked to give an overview of the disease and the frequent need to have multispecialty involvement.  I feel the risks of no intervention will lead to serious problems that outweigh the operative risks; therefore, I recommended a partial colectomy to remove the pathology.  Laparoscopic & open techniques were discussed.   Risks such as bleeding, infection, abscess, leak, reoperation, possible ostomy, hernia, heart attack, death, and other risks were discussed.  I noted a good likelihood this will help address the problem.   Goals of post-operative recovery were discussed as well.  We will work to minimize complications.  Educational materials on the pathology had been given in the office.  Questions were answered.    The patient expressed understanding & wished to proceed with surgery.  OR FINDINGS:   Patient had rectal polypoid mass on right posterior rectal wall involving 20% of the circumference.  Located 9-11 cm from anal verge  No obvious metastatic disease on visceral parietal peritoneum or liver.  The anastomosis rests 6 cm from the anal verge by rigid proctoscopy.  CASE DATA:  Type of patient?: Elective [facility] Private Case  Status of Case? Elective Scheduled  Infection Present At Time Of Surgery (PATOS)?  NO  DESCRIPTION:   Informed consent was confirmed.  The patient underwent general anaesthesia without difficulty.  The patient was positioned appropriately.  VTE prevention in place.  The patient was clipped, prepped, & draped in a sterile fashion.  Surgical timeout confirmed our plan.  The patient was positioned in  reverse Trendelenburg.  Abdominal entry was gained using Varess technique at the left subcostal ridge on the anterior abdominal wall.  No elevated EtCO2 noted.  Port placed.  Camera inspection revealed no injury.  Extra ports were  carefully placed under direct laparoscopic visualization.   I reflected the greater omentum and the upper abdomen the small bowel in the upper abdomen.  The patient was carefully positioned.  The Intuitive daVinci robot was docked with camera & instruments carefully placed.  The patient had tattooing of his mid sigmoid colon that seem consistent with his prior polypectomy sites.  He also had tattooing and the peritoneal reflection.  I did rigid proctoscopy to find the mass on the right posterior rectal wall with the most distal and around 9 cm from the anal verge.  This correlated with just proximal to the peritoneal reflection on robotic laparoscopic visualization.   I mobilized the rectosigmoid colon & elevated it to put the main pedicle on tension.  I scored the base of peritoneum of the medial side of the mesentery of the elevated left colon from the ligament of Treitz to the mid rectum.   I elevated the sigmoid mesentery and entered into the retro-mesenteric plane. We were able to identify the left ureter and gonadal vessels. We kept those posterior within the retroperitoneum and elevated the left colon mesentery off that. I did isolate the inferior mesenteric artery (IMA) pedicle but did not ligate it yet.  I continued distally and got into the avascular plane posterior to the mesorectum, sparing the nervi ergentes.. This allowed me to help mobilize the rectum as well by freeing the mesorectum off the sacrum.  I stayed away from the right and left ureters.  I kept the lateral vascular pedicles to the rectum intact.  I skeletonized the lymph nodes off the inferior mesenteric artery pedicle.  I went down to its takeoff from the aorta.  I isolated the inferior mesenteric vein off of the ligament of Treitz just cephalad to that as well.  After confirming the left ureter was out of the way, I went ahead and ligated the inferior mesenteric artery pedicle just near its takeoff from the aorta.  I did ligate the  inferior mesenteric vein in a similar fashion.  We ensured hemostasis. I mobilized the peritoneal coverings down to & around peritoneal reflection on both the right and left sides of the rectum.  Mobilized the anterior rectal wall off of the mid to low pelvis.  I mobilized the mesorectum off the presacral space.  I repeated rigid proctoscopy to confirm that I dissected distal to the mass and chose an area 2 cm distally to the distal margin of the mass just distal to the peritoneal reflection.  Transected through the mesorectum on right and left lateral sides and came around posteriorly to completely transect the mesorectum off the mid/distal rectum.    I mobilized the left colon in a lateral to medial fashion off the retroperitoneum and sidewall attachments along the line of Toldt up towards the splenic flexure to ensure good mobilization of the remaining left colon to reach into the pelvis. I chose a region at the descending/sigmoid junction that was soft and easily reached down to the rectal stump.  I transected the mesentery of the colon radially to preserve remaining colon blood supply.  We asked anesthesia to dilute the indocyanine green (ICG) to 10 mL and inject 3 mL intravenously with IV flush.  I switched to the NIR fluorescence (Firefly mode)  imaging window on the daVinci platform.  I was able to see good light green visualization of blood vessels with good perfusion of tissues, confirming good tissue perfusion of both ends planned for anastomosis (descending colon to mid/distal rectum).  I skeletonized the mesorectum and transected at the proximal rectum using a robotic stapler.  I created an extraction incision through a small Pfannenstiel incision in the suprapubic region.  Placed a wound protector.  I was able to eviscerate the rectosigmoid and descending colon out the wound.   I clamped the colon proximal to this area using a reusable pursestringer device.  Passed a 2-0 Keith needle. I transected  at the descending/sigmoid junction with a scalpel. I got healthy bleeding mucosa.  We sent the rectosigmoid colon specimen off to go to pathology.  We sized the colon orifice.  I chose a 71mm EEA anvil stapler system.  I reinforced the prolene pursestring with interrupted silk suture.  I placed the anvil to the open end of the proximal remaining colon and closed around it using the pursestring.  I went back and opened up the specimen at the proximal rectum and to the distal end and found the mass of concern.  Grossly 2 cm distal margin noted.  Dr Marcello Moores did copious irrigation with crystalloid solution.  Hemostasis was good.  The distal end of the remaining colon easily reached down to the rectal stump, therefore, splenic flexure mobilization was not needed.      I scrubbed down and did gentle anal dilation and advanced the EEA stapler up the rectal stump. The spike was brought out at the provimal end of the rectal stump under direct visualization.  I  attached the anvil of the proximal colon the spike of the stapler. Anvil was tightened down and held clamped for 60 seconds. The EEA stapler was fired and held clamped for 30 seconds. The stapler was released & removed. We noted 2 excellent anastomotic rings. Blue stitch is in the proximal ring.  I did rigid proctoscopy noted the anastomosis was at 6 cm from the anal verge consistent with the proximal rectum.  We irrigation of isotonic solution & held that for the pelvic air leak test .  The rectum was insufflated the rectum while clamping the colon proximal to that anastomosis.  There was a negative air leak test. There was no tension of mesentery or bowel at the anastomosis.   Tissues looked viable.  Ureters & bowel uninjured.  The anastomosis looked healthy. Greater omentum positioned down into the pelvis to help protect the anastomosis.  Given the low pelvic dissection we left a 42 French Blake drain in the pelvis  Endoluminal gas was evacuated.  Ports & wound  protector removed.  We changed gloves & redraped the patient per colon SSI prevention protocol.  We aspirated the antibiotic irrigation.  Hemostasis was good.  Sterile unused instruments were used from this point.  I closed the skin at the port sites using Monocryl stitch and sterile dressing.  I closed the extraction wound using a 0 Vicryl vertical peritoneal closure and a #1 PDS transverse anterior rectal fascial closure like a small Pfannenstiel closure. I closed the skin with some interrupted Monocryl stitches. I placed sterile dressings.     Patient is being extubated go to recovery room. I had discussed postop care with the patient in detail the office & in the holding area. Instructions are written. I discussed operative findings, updated the patient's status, discussed probable steps to recovery, and  gave postoperative recommendations to the patient's spouse, Diron Haddon. Recommendations were made.  Questions were answered.  She expressed understanding & appreciation.  Adin Hector, M.D., F.A.C.S. Gastrointestinal and Minimally Invasive Surgery [facility] [address] [place], [place] [address] [phone] Main / Paging

## 2020-06-21 NOTE — Consult Note (Signed)
[place] Nurse requested for preoperative stoma site marking  Discussed surgical procedure and stoma creation with patient.  Explained role of the [place] nurse team.  Provided the patient with educational booklet and provided samples of pouching options.  Answered patient and family questions.   Examined patient lying and sitting in order to place the marking in the patient's visual field, away from any creases or abdominal contour issues and within the rectus muscle.   Marked for colostomy in the LLQ  __3.5cm __ cm to the left of the umbilicus and __3.4__HD below the umbilicus.  Marked for ileostomy in the RLQ  __4__cm to the right of the umbilicus and  _6.2___ cm below the umbilicus.   Patient had just had CHG bath prior to my arrival.Covered mark with thin film transparent dressing to preserve mark until date of surgery.   [place] Nurse team will follow up with patient after surgery for continue ostomy care and teaching.  [place] MSN, [place], [place], [place]

## 2020-06-21 NOTE — Discharge Instructions (Signed)
SURGERY: POST OP INSTRUCTIONS °(Surgery for small bowel obstruction, colon resection, etc) ° ° °###################################################################### ° °EAT °Gradually transition to a high fiber diet with a fiber supplement over the next few days after discharge ° °WALK °Walk an hour a day.  Control your pain to do that.   ° °CONTROL PAIN °Control pain so that you can walk, sleep, tolerate sneezing/coughing, go up/down stairs. ° °HAVE A BOWEL MOVEMENT DAILY °Keep your bowels regular to avoid problems.  OK to try a laxative to override constipation.  OK to use an antidairrheal to slow down diarrhea.  Call if not better after 2 tries ° °CALL IF YOU HAVE PROBLEMS/CONCERNS °Call if you are still struggling despite following these instructions. °Call if you have concerns not answered by these instructions ° °###################################################################### ° ° °DIET °Follow a light diet the first few days at home.  Start with a bland diet such as soups, liquids, starchy foods, low fat foods, etc.  If you feel full, bloated, or constipated, stay on a ful liquid or pureed/blenderized diet for a few days until you feel better and no longer constipated. °Be sure to drink plenty of fluids every day to avoid getting dehydrated (feeling dizzy, not urinating, etc.). °Gradually add a fiber supplement to your diet over the next week.  Gradually get back to a regular solid diet.  Avoid fast food or heavy meals the first week as you are more likely to get nauseated. °It is expected for your digestive tract to need a few months to get back to normal.  It is common for your bowel movements and stools to be irregular.  You will have occasional bloating and cramping that should eventually fade away.  Until you are eating solid food normally, off all pain medications, and back to regular activities; your bowels will not be normal. °Focus on eating a low-fat, high fiber diet the rest of your life  (See Getting to Good Bowel Health, below). ° °CARE of your INCISION or WOUND °It is good for closed incision and even open wounds to be washed every day.  Shower every day.  Short baths are fine.  Wash the incisions and wounds clean with soap & water.    °If you have a closed incision(s), wash the incision with soap & water every day.  You may leave closed incisions open to air if it is dry.   You may cover the incision with clean gauze & replace it after your daily shower for comfort. °If you have skin tapes (Steristrips) or skin glue (Dermabond) on your incision, leave them in place.  They will fall off on their own like a scab.  You may trim any edges that curl up with clean scissors.  If you have staples, set up an appointment for them to be removed in the office in 10 days after surgery.  °If you have a drain, wash around the skin exit site with soap & water and place a new dressing of gauze or band aid around the skin every day.  Keep the drain site clean & dry.    °If you have an open wound with packing, see wound care instructions.  In general, it is encouraged that you remove your dressing and packing, shower with soap & water, and replace your dressing once a day.  Pack the wound with clean gauze moistened with normal (0.9%) saline to keep the wound moist & uninfected.  Pressure on the dressing for 30 minutes will stop most wound   bleeding.  Eventually your body will heal & pull the open wound closed over the next few months.  °Raw open wounds will occasionally bleed or secrete yellow drainage until it heals closed.  Drain sites will drain a little until the drain is removed.  Even closed incisions can have mild bleeding or drainage the first few days until the skin edges scab over & seal.   °If you have an open wound with a wound vac, see wound vac care instructions. ° ° ° ° °ACTIVITIES as tolerated °Start light daily activities --- self-care, walking, climbing stairs-- beginning the day after surgery.   Gradually increase activities as tolerated.  Control your pain to be active.  Stop when you are tired.  Ideally, walk several times a day, eventually an hour a day.   °Most people are back to most day-to-day activities in a few weeks.  It takes 4-8 weeks to get back to unrestricted, intense activity. °If you can walk 30 minutes without difficulty, it is safe to try more intense activity such as jogging, treadmill, bicycling, low-impact aerobics, swimming, etc. °Save the most intensive and strenuous activity for last (Usually 4-8 weeks after surgery) such as sit-ups, heavy lifting, contact sports, etc.  Refrain from any intense heavy lifting or straining until you are off narcotics for pain control.  You will have off days, but things should improve week-by-week. °DO NOT PUSH THROUGH PAIN.  Let pain be your guide: If it hurts to do something, don't do it.  Pain is your body warning you to avoid that activity for another week until the pain goes down. °You may drive when you are no longer taking narcotic prescription pain medication, you can comfortably wear a seatbelt, and you can safely make sudden turns/stops to protect yourself without hesitating due to pain. °You may have sexual intercourse when it is comfortable. If it hurts to do something, stop. ° °MEDICATIONS °Take your usually prescribed home medications unless otherwise directed.   °Blood thinners:  °Usually you can restart any strong blood thinners after the second postoperative day.  It is OK to take aspirin right away.    ° If you are on strong blood thinners (warfarin/Coumadin, Plavix, Xerelto, Eliquis, Pradaxa, etc), discuss with your surgeon, medicine PCP, and/or cardiologist for instructions on when to restart the blood thinner & if blood monitoring is needed (PT/INR blood check, etc).   ° ° °PAIN CONTROL °Pain after surgery or related to activity is often due to strain/injury to muscle, tendon, nerves and/or incisions.  This pain is usually  short-term and will improve in a few months.  °To help speed the process of healing and to get back to regular activity more quickly, DO THE FOLLOWING THINGS TOGETHER: °1. Increase activity gradually.  DO NOT PUSH THROUGH PAIN °2. Use Ice and/or Heat °3. Try Gentle Massage and/or Stretching °4. Take over the counter pain medication °5. Take Narcotic prescription pain medication for more severe pain ° °Good pain control = faster recovery.  It is better to take more medicine to be more active than to stay in bed all day to avoid medications. °1.  Increase activity gradually °Avoid heavy lifting at first, then increase to lifting as tolerated over the next 6 weeks. °Do not “push through” the pain.  Listen to your body and avoid positions and maneuvers than reproduce the pain.  Wait a few days before trying something more intense °Walking an hour a day is encouraged to help your body recover faster   and more safely.  Start slowly and stop when getting sore.  If you can walk 30 minutes without stopping or pain, you can try more intense activity (running, jogging, aerobics, cycling, swimming, treadmill, sex, sports, weightlifting, etc.) °Remember: If it hurts to do it, then don’t do it! °2. Use Ice and/or Heat °You will have swelling and bruising around the incisions.  This will take several weeks to resolve. °Ice packs or heating pads (6-8 times a day, 30-60 minutes at a time) will help sooth soreness & bruising. °Some people prefer to use ice alone, heat alone, or alternate between ice & heat.  Experiment and see what works best for you.  Consider trying ice for the first few days to help decrease swelling and bruising; then, switch to heat to help relax sore spots and speed recovery. °Shower every day.  Short baths are fine.  It feels good!  Keep the incisions and wounds clean with soap & water.   °3. Try Gentle Massage and/or Stretching °Massage at the area of pain many times a day °Stop if you feel pain - do not  overdo it °4. Take over the counter pain medication °This helps the muscle and nerve tissues become less irritable and calm down faster °Choose ONE of the following over-the-counter anti-inflammatory medications: °Acetaminophen 500mg tabs (Tylenol) 1-2 pills with every meal and just before bedtime (avoid if you have liver problems or if you have acetaminophen in you narcotic prescription) °Naproxen 220mg tabs (ex. Aleve, Naprosyn) 1-2 pills twice a day (avoid if you have kidney, stomach, IBD, or bleeding problems) °Ibuprofen 200mg tabs (ex. Advil, Motrin) 3-4 pills with every meal and just before bedtime (avoid if you have kidney, stomach, IBD, or bleeding problems) °Take with food/snack several times a day as directed for at least 2 weeks to help keep pain / soreness down & more manageable. °5. Take Narcotic prescription pain medication for more severe pain °A prescription for strong pain control is often given to you upon discharge (for example: oxycodone/Percocet, hydrocodone/Norco/Vicodin, or tramadol/Ultram) °Take your pain medication as prescribed. °Be mindful that most narcotic prescriptions contain Tylenol (acetaminophen) as well - avoid taking too much Tylenol. °If you are having problems/concerns with the prescription medicine (does not control pain, nausea, vomiting, rash, itching, etc.), please call us [phone] to see if we need to switch you to a different pain medicine that will work better for you and/or control your side effects better. °If you need a refill on your pain medication, you must call the office before 4 pm and on weekdays only.  By federal law, prescriptions for narcotics cannot be called into a pharmacy.  They must be filled out on paper & picked up from our office by the patient or authorized caretaker.  Prescriptions cannot be filled after 4 pm nor on weekends.   ° °WHEN TO CALL US [phone] °Severe uncontrolled or worsening pain  °Fever over 101 F (38.5 C) °Concerns with  the incision: Worsening pain, redness, rash/hives, swelling, bleeding, or drainage °Reactions / problems with new medications (itching, rash, hives, nausea, etc.) °Nausea and/or vomiting °Difficulty urinating °Difficulty breathing °Worsening fatigue, dizziness, lightheadedness, blurred vision °Other concerns °If you are not getting better after two weeks or are noticing you are getting worse, contact our office [phone] for further advice.  We may need to adjust your medications, re-evaluate you in the office, send you to the emergency room, or see what other things we can do to help. °The   clinic staff is available to answer your questions during regular business hours (8:30am-5pm).  Please don’t hesitate to call and ask to speak to one of our nurses for clinical concerns.    °A surgeon from [facility] is always on call at the hospitals 24 hours/day °If you have a medical emergency, go to the nearest emergency room or call 911. ° °FOLLOW UP in our office °One the day of your discharge from the hospital (or the next business weekday), please call [facility] to set up or confirm an appointment to see your surgeon in the office for a follow-up appointment.  Usually it is 2-3 weeks after your surgery.   °If you have skin staples at your incision(s), let the office know so we can set up a time in the office for the nurse to remove them (usually around 10 days after surgery). °Make sure that you call for appointments the day of discharge (or the next business weekday) from the hospital to ensure a convenient appointment time. °IF YOU HAVE DISABILITY OR FAMILY LEAVE FORMS, BRING THEM TO THE OFFICE FOR PROCESSING.  DO NOT GIVE THEM TO YOUR DOCTOR. ° °[facility] °[address], [address], [place], [place]  [address] ? °[phone] - Main °[phone] - Toll Free,  [phone] - Fax °[email or web address] ° °GETTING TO GOOD BOWEL HEALTH. °It is  expected for your digestive tract to need a few months to get back to normal.  It is common for your bowel movements and stools to be irregular.  You will have occasional bloating and cramping that should eventually fade away.  Until you are eating solid food normally, off all pain medications, and back to regular activities; your bowels will not be normal.   °Avoiding constipation °The goal: ONE SOFT BOWEL MOVEMENT A DAY!    °Drink plenty of fluids.  Choose water first. °TAKE A FIBER SUPPLEMENT EVERY DAY THE REST OF YOUR LIFE °During your first week back home, gradually add back a fiber supplement every day °Experiment which form you can tolerate.   There are many forms such as powders, tablets, wafers, gummies, etc °Psyllium bran (Metamucil), methylcellulose (Citrucel), Miralax or Glycolax, Benefiber, Flax Seed.  °Adjust the dose week-by-week (1/2 dose/day to 6 doses a day) until you are moving your bowels 1-2 times a day.  Cut back the dose or try a different fiber product if it is giving you problems such as diarrhea or bloating. °Sometimes a laxative is needed to help jump-start bowels if constipated until the fiber supplement can help regulate your bowels.  If you are tolerating eating & you are farting, it is okay to try a gentle laxative such as double dose MiraLax, prune juice, or Milk of Magnesia.  Avoid using laxatives too often. °Stool softeners can sometimes help counteract the constipating effects of narcotic pain medicines.  It can also cause diarrhea, so avoid using for too long. °If you are still constipated despite taking fiber daily, eating solids, and a few doses of laxatives, call our office. °Controlling diarrhea °Try drinking liquids and eating bland foods for a few days to avoid stressing your intestines further. °Avoid dairy products (especially milk & ice cream) for a short time.  The intestines often can lose the ability to digest lactose when stressed. °Avoid foods that cause gassiness or  bloating.  Typical foods include beans and other legumes, cabbage, broccoli, and dairy foods.  Avoid greasy, spicy, fast foods.  Every person has   some sensitivity to other foods, so listen to your body and avoid those foods that trigger problems for you. °Probiotics (such as active yogurt, Align, etc) may help repopulate the intestines and colon with normal bacteria and calm down a sensitive digestive tract °Adding a fiber supplement gradually can help thicken stools by absorbing excess fluid and retrain the intestines to act more normally.  Slowly increase the dose over a few weeks.  Too much fiber too soon can backfire and cause cramping & bloating. °It is okay to try and slow down diarrhea with a few doses of antidiarrheal medicines.   °Bismuth subsalicylate (ex. Kayopectate, Pepto Bismol) for a few doses can help control diarrhea.  Avoid if pregnant.   °Loperamide (Imodium) can slow down diarrhea.  Start with one tablet (2mg) first.  Avoid if you are having fevers or severe pain.  °ILEOSTOMY PATIENTS WILL HAVE CHRONIC DIARRHEA since their colon is not in use.    °Drink plenty of liquids.  You will need to drink even more glasses of water/liquid a day to avoid getting dehydrated. °Record output from your ileostomy.  Expect to empty the bag every 3-4 hours at first.  Most people with a permanent ileostomy empty their bag 4-6 times at the least.   °Use antidiarrheal medicine (especially Imodium) several times a day to avoid getting dehydrated.  Start with a dose at bedtime & breakfast.  Adjust up or down as needed.  Increase antidiarrheal medications as directed to avoid emptying the bag more than 8 times a day (every 3 hours). °Work with your wound ostomy nurse to learn care for your ostomy.  See ostomy care instructions. °TROUBLESHOOTING IRREGULAR BOWELS °1) Start with a soft & bland diet. No spicy, greasy, or fried foods.  °2) Avoid gluten/wheat or dairy products from diet to see if symptoms improve. °3) Miralax  17gm or flax seed mixed in 8oz. water or juice-daily. May use 2-4 times a day as needed. °4) Gas-X, Phazyme, etc. as needed for gas & bloating.  °5) Prilosec (omeprazole) over-the-counter as needed °6)  Consider probiotics (Align, Activa, etc) to help calm the bowels down ° °Call your doctor if you are getting worse or not getting better.  Sometimes further testing (cultures, endoscopy, X-ray studies, CT scans, bloodwork, etc.) may be needed to help diagnose and treat the cause of the diarrhea. °[facility] °[address], [address], [place], [place]  [address] °[phone] - Main.    °[phone]  - Toll Free.   [phone] - Fax °[email or web address] ° ° °

## 2020-06-21 NOTE — Anesthesia Preprocedure Evaluation (Addendum)
Anesthesia Evaluation  Patient identified by MRN, date of birth, ID band Patient awake    Reviewed: Allergy & Precautions, NPO status , Patient's Chart, lab work & pertinent test results  Airway Mallampati: I  TM Distance: >3 FB Neck ROM: Full    Dental no notable dental hx. (+) Teeth Intact, Dental Advisory Given   Pulmonary neg pulmonary ROS, former smoker,    Pulmonary exam normal breath sounds clear to auscultation       Cardiovascular hypertension, Pt. on medications Normal cardiovascular exam Rhythm:Regular Rate:Normal  HLD  Stress Test 2020 The left ventricular ejection fraction is normal (55-65%). Nuclear stress EF: 55%. There was no ST segment deviation noted during stress. Defect 1: There is a small defect of mild severity present in the apex location. The study is normal. This is a low risk study  TTE 2020 unremarkable    Neuro/Psych PSYCHIATRIC DISORDERS Anxiety Depression negative neurological ROS     GI/Hepatic Neg liver ROS, GERD  Medicated,  Endo/Other  negative endocrine ROS  Renal/GU negative Renal ROS  negative genitourinary   Musculoskeletal  (+) Arthritis ,   Abdominal   Peds  Hematology  (+) REFUSES BLOOD PRODUCTS, JEHOVAH'S WITNESS  Anesthesia Other Findings Rectal CA  Reproductive/Obstetrics                           Anesthesia Physical Anesthesia Plan  ASA: II  Anesthesia Plan: General   Post-op Pain Management:    Induction: Intravenous  PONV Risk Score and Plan: 2 and Midazolam, Dexamethasone and Ondansetron  Airway Management Planned: Oral ETT  Additional Equipment:   Intra-op Plan:   Post-operative Plan: Extubation in OR  Informed Consent: I have reviewed the patients History and Physical, chart, labs and discussed the procedure including the risks, benefits and alternatives for the proposed anesthesia with the patient or authorized  representative who has indicated his/her understanding and acceptance.     Dental advisory given  Plan Discussed with: CRNA  Anesthesia Plan Comments: (2nd IV)        Anesthesia Quick Evaluation

## 2020-06-22 ENCOUNTER — Encounter (HOSPITAL_COMMUNITY): Payer: Self-pay | Admitting: Surgery

## 2020-06-22 DIAGNOSIS — R739 Hyperglycemia, unspecified: Secondary | ICD-10-CM

## 2020-06-22 LAB — CBC
HCT: 36.3 % — ABNORMAL LOW (ref 39.0–52.0)
Hemoglobin: 12.2 g/dL — ABNORMAL LOW (ref 13.0–17.0)
MCH: 30.3 pg (ref 26.0–34.0)
MCHC: 33.6 g/dL (ref 30.0–36.0)
MCV: 90.3 fL (ref 80.0–100.0)
Platelets: 245 10*3/uL (ref 150–400)
RBC: 4.02 MIL/uL — ABNORMAL LOW (ref 4.22–5.81)
RDW: 14.1 % (ref 11.5–15.5)
WBC: 10.2 10*3/uL (ref 4.0–10.5)
nRBC: 0 % (ref 0.0–0.2)

## 2020-06-22 LAB — BASIC METABOLIC PANEL
Anion gap: 10 (ref 5–15)
BUN: 10 mg/dL (ref 8–23)
CO2: 22 mmol/L (ref 22–32)
Calcium: 8.4 mg/dL — ABNORMAL LOW (ref 8.9–10.3)
Chloride: 106 mmol/L (ref 98–111)
Creatinine, Ser: 0.98 mg/dL (ref 0.61–1.24)
GFR, Estimated: >60 mL/min (ref >60)
Glucose, Bld: 147 mg/dL — ABNORMAL HIGH (ref 70–99)
Potassium: 3.7 mmol/L (ref 3.5–5.1)
Sodium: 138 mmol/L (ref 135–145)

## 2020-06-22 LAB — MRSA PCR SCREENING: MRSA by PCR: NEGATIVE

## 2020-06-22 LAB — MAGNESIUM: Magnesium: 1.9 mg/dL (ref 1.7–2.4)

## 2020-06-22 LAB — NO BLOOD PRODUCTS

## 2020-06-22 LAB — GLUCOSE, CAPILLARY
Glucose-Capillary: 111 mg/dL — ABNORMAL HIGH (ref 70–99)
Glucose-Capillary: 124 mg/dL — ABNORMAL HIGH (ref 70–99)
Glucose-Capillary: 81 mg/dL (ref 70–99)
Glucose-Capillary: 95 mg/dL (ref 70–99)

## 2020-06-22 MED ORDER — LISINOPRIL 10 MG PO TABS: 30.0000 mg | ORAL_TABLET | Freq: Every day | ORAL | Status: DC

## 2020-06-22 MED ORDER — INSULIN ASPART 100 UNIT/ML ~~LOC~~ SOLN: 0.0000 [IU] | Freq: Every day | SUBCUTANEOUS | Status: DC

## 2020-06-22 MED ORDER — LISINOPRIL 20 MG PO TABS
20.0000 mg | ORAL_TABLET | Freq: Every day | ORAL | Status: DC
  Administered 2020-06-22 – 2020-06-24 (×3): 20 mg via ORAL
  Filled 2020-06-22 (×2): qty 1

## 2020-06-22 MED ORDER — INSULIN ASPART 100 UNIT/ML ~~LOC~~ SOLN
0.0000 [IU] | Freq: Three times a day (TID) | SUBCUTANEOUS | Status: DC
  Administered 2020-06-22 – 2020-06-23 (×2): 2 [IU] via SUBCUTANEOUS

## 2020-06-22 NOTE — Anesthesia Postprocedure Evaluation (Signed)
Anesthesia Post Note  Patient: Nathan [name]  Procedure(s) Performed: XI ROBOTIC ASSISTED LOWER ANTERIOR RECTOSIGMOID RESECTION, BILATERAL TAP BLOCK (N/A Abdomen) POSSIBLE OSTOMY (N/A ) RIGID PROCTOSCOPY (N/A )     Patient location during evaluation: PACU Anesthesia Type: General Level of consciousness: awake and alert Pain management: pain level controlled Vital Signs Assessment: post-procedure vital signs reviewed and stable Respiratory status: spontaneous breathing, nonlabored ventilation, respiratory function stable and patient connected to nasal cannula oxygen Cardiovascular status: blood pressure returned to baseline and stable Postop Assessment: no apparent nausea or vomiting Anesthetic complications: no   No complications documented.  Last Vitals:  Vitals:   06/22/20 0650 06/22/20 0700  BP: 132/79   Pulse: 86   Resp: 18   Temp:  36.8 C  SpO2: 100%     Last Pain:  Vitals:   06/22/20 0512  TempSrc: Oral  PainSc:                  Linday Rhodes L Adonys Wildes

## 2020-06-22 NOTE — Progress Notes (Addendum)
Nathan [name] [id number] [birth year]  CARE TEAM:  PCP: Ma Hillock, DO  Outpatient Care Team: Patient Care Team: Ma Hillock, DO as PCP - General (Family Medicine) [place], [facility] Leandrew Koyanagi, MD as Attending Physician (Orthopedic Surgery) Jonnie Finner, RN as Oncology Nurse Navigator Ladell Pier, MD as Consulting Physician (Oncology) Michael Boston, MD as Consulting Physician (General Surgery) Richardo Priest, MD as Consulting Physician (Cardiology)  Inpatient Treatment Team: Treatment Team: Attending Provider: Michael Boston, MD; Registered Nurse: Raylene Everts, RN; Registered Nurse: Burnadette Pop, RN; Technician: Ernestina Patches, NT; Registered Nurse: Carl Best, RN   Problem List:   Principal Problem:   Rectal cancer s/p robotic LAR 06/21/2020 Active Problems:   Refusal of blood transfusions as patient is Jehovah's Witness   Hyperlipidemia   Hearing aid worn   Essential hypertension, benign   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  Located 9-11 cm from anal verge  No obvious metastatic disease on visceral parietal peritoneum or liver.  The anastomosis rests 6 cm from the anal verge by rigid proctoscopy   Assessment  Transient bradycardia hypotension resolved.  [facility] Stay = 1 days)  Plan:  Enhance recovery protocol.  Dysphagia 1 diet.  DC Foley.  Strict INO.  Stop IV fluids.  As needed boluses as needed.  Nonnarcotic pain regimen.  Hypertensive again.  Will use lisinopril and  have low threshold to hold amlodipine if he becomes bradycardic again.  Try and mobilize.  Hopefully transfer to floor if no events today.  Keep on telemetry.  Hyperglycemia with underwhelming preoperative hemoglobin A1c.  Most likely due to steroid bolus perioperatively per postop nausea protocol.  Sliding scale and follow  Follow-up on pathology.  Discussed operative findings and technique.  If he has any more episodes of bradycardia or other concerns, may ask cardiology to weigh in.  -VTE prophylaxis- SCDs, etc. continue enoxaparin.  Hold if hemoglobin drifts below 8.  Hopefully not too likely.  -mobilize as tolerated to help recovery  Disposition:  The patient is from: Home  Anticipate discharge to:  Home  Anticipated Date of Discharge is:  06/24/2020  Barriers to discharge:  Pending Clinical improvement (more likely than not)  Patient currently is NOT MEDICALLY STABLE for discharge from the hospital from a surgery standpoint.   30 minutes spent in review, evaluation, examination, counseling, and coordination of care.  More than 50% of that time was spent in counseling.  06/22/2020    Subjective: (Chief complaint)  Episode of bradycardia and hypotension last night.  Transfer stepdown unit.  No complaints since.  No nausea or vomiting.  Denies any pain.  Stepdown nursing just outside room.  Objective:  Vital signs:  Vitals:   06/22/20 0512 06/22/20 0600 06/22/20 0645 06/22/20 0650  BP:  (!) 156/72 (!) 152/73 132/79  Pulse: (!) 6 72 67 86  Resp:  16 (!) 8 18  Temp: 98.2 F (36.8 C)     TempSrc: Oral     SpO2:  99% 99% 100%  Weight:      Height:  Last BM Date: 06/20/20  Intake/Output   Yesterday:  12/08 0701 - 12/09 0700 In: 3017.7 [P.O.:240; I.V.:2511.7; IV Piggyback:266] Out: 1856 [Urine:1150; Drains:205; Blood:100] This shift:  No intake/output data recorded.  Bowel function:  Flatus: No  BM:  No  Drain:  Serosanguinous   Physical Exam:  General: Pt awake/alert in no acute distress.  Bright and alert.  Smiling.  Gave me for stump.  Relaxed.  Not sickly.  Not toxic.  Currently with good   Eyes: PERRL, normal EOM.  Sclera clear.  No icterus Neuro: CN II-XII intact w/o focal sensory/motor deficits. Lymph: No head/neck/groin lymphadenopathy Psych:  No delerium/psychosis/paranoia.  Oriented x 4 HENT: Normocephalic, Mucus membranes moist.  No thrush Neck: Supple, No tracheal deviation.  No obvious thyromegaly Chest: No pain to chest wall compression.  Good respiratory excursion.  No audible wheezing CV:  Pulses intact.  Regular rhythm.  No major extremity edema MS: Normal AROM mjr joints.  No obvious deformity  Abdomen: Soft.  Mildy distended.  Mildly tender at incisions only.  No evidence of peritonitis.  No incarcerated hernias.  Ext:  No deformity.  No mjr edema.  No cyanosis Skin: No petechiae / purpurea.  No major sores.  Warm and dry    Results:   Cultures: Recent Results (from the past 720 hour(s))  SARS CORONAVIRUS 2 (TAT 6-24 HRS) Nasopharyngeal Nasopharyngeal Swab     Status: None   Collection Time: 06/20/20  8:25 AM   Specimen: Nasopharyngeal Swab  Result Value Ref Range Status   SARS Coronavirus 2 NEGATIVE NEGATIVE Final    Comment: (NOTE) SARS-CoV-2 target nucleic acids are NOT DETECTED.  The SARS-CoV-2 RNA is generally detectable in upper and lower respiratory specimens during the acute phase of infection. Negative results do not preclude SARS-CoV-2 infection, do not rule out co-infections with other pathogens, and should not be used as the sole basis for treatment or other patient management decisions. Negative results must be combined with clinical observations, patient history, and epidemiological information. The expected result is Negative.  Fact Sheet for Patients: SugarRoll.be  Fact Sheet for Healthcare  Providers: https://www.woods-mathews.com/  This test is not yet approved or cleared by the [place] FDA and  has been authorized for detection and/or diagnosis of SARS-CoV-2 by FDA under an Emergency Use Authorization (EUA). This EUA will remain  in effect (meaning this test can be used) for the duration of the COVID-19 declaration under Se ction 564(b)(1) of the Act, 21 U.S.C. section 360bbb-3(b)(1), unless the authorization is terminated or revoked sooner.  Performed at [facility], [place] [address] [place], [place] [address]   MRSA PCR Screening     Status: None   Collection Time: 06/21/20 11:12 PM   Specimen: Nasopharyngeal  Result Value Ref Range Status   MRSA by PCR NEGATIVE NEGATIVE Final    Comment:        The GeneXpert MRSA Assay (FDA approved for NASAL specimens only), is one component of a comprehensive MRSA colonization surveillance program. It is not intended to diagnose MRSA infection nor to guide or monitor treatment for MRSA infections. Performed at [facility], [place] [address] [place], [place] [address]     Labs: Results for orders placed or performed during the hospital encounter of 06/21/20 (from the past 48 hour(s))  Hemoglobin A1c     Status: None   Collection Time: 06/21/20 10:17 AM  Result Value Ref Range   Hgb A1c MFr Bld 5.5 4.8 - 5.6 %  Comment: (NOTE) Pre diabetes:          5.7%-6.4%  Diabetes:              >6.4%  Glycemic control for   <7.0% adults with diabetes    Mean Plasma Glucose 111.15 mg/dL    Comment: Performed at [facility] [address] [place], [place] [address]  Comprehensive metabolic panel per protocol     Status: Abnormal   Collection Time: 06/21/20 10:17 AM  Result Value Ref Range   Sodium 138 135 - 145 mmol/L   Potassium 3.4 (L) 3.5 - 5.1 mmol/L   Chloride 104 98 - 111 mmol/L   CO2 22 22 - 32 mmol/L   Glucose, Bld 97 70 - 99 mg/dL    Comment: Glucose reference  range applies only to samples taken after fasting for at least 8 hours.   BUN 10 8 - 23 mg/dL   Creatinine, Ser 1.15 0.61 - 1.24 mg/dL   Calcium 9.4 8.9 - 10.3 mg/dL   Total Protein 7.7 6.5 - 8.1 g/dL   Albumin 4.6 3.5 - 5.0 g/dL   AST 27 15 - 41 U/L   ALT 21 0 - 44 U/L   Alkaline Phosphatase 76 38 - 126 U/L   Total Bilirubin 0.9 0.3 - 1.2 mg/dL   GFR, Estimated >60 >60 mL/min    Comment: (NOTE) Calculated using the CKD-EPI Creatinine Equation (2021)    Anion gap 12 5 - 15    Comment: Performed at [facility], [place] [address] [place], [place] [address]  CBC per protocol     Status: None   Collection Time: 06/21/20 10:17 AM  Result Value Ref Range   WBC 7.6 4.0 - 10.5 K/uL   RBC 5.21 4.22 - 5.81 MIL/uL   Hemoglobin 15.5 13.0 - 17.0 g/dL   HCT 46.2 39.0 - 52.0 %   MCV 88.7 80.0 - 100.0 fL   MCH 29.8 26.0 - 34.0 pg   MCHC 33.5 30.0 - 36.0 g/dL   RDW 13.9 11.5 - 15.5 %   Platelets 304 150 - 400 K/uL   nRBC 0.0 0.0 - 0.2 %    Comment: Performed at [facility], [place] [address] [place], [place] [address]  Glucose, capillary     Status: Abnormal   Collection Time: 06/21/20 10:30 PM  Result Value Ref Range   Glucose-Capillary 168 (H) 70 - 99 mg/dL    Comment: Glucose reference range applies only to samples taken after fasting for at least 8 hours.  CBC     Status: Abnormal   Collection Time: 06/21/20 11:04 PM  Result Value Ref Range   WBC 11.2 (H) 4.0 - 10.5 K/uL   RBC 3.62 (L) 4.22 - 5.81 MIL/uL   Hemoglobin 11.1 (L) 13.0 - 17.0 g/dL    Comment: REPEATED TO VERIFY DLTA    HCT 32.8 (L) 39.0 - 52.0 %   MCV 90.6 80.0 - 100.0 fL   MCH 30.7 26.0 - 34.0 pg   MCHC 33.8 30.0 - 36.0 g/dL   RDW 14.1 11.5 - 15.5 %   Platelets 217 150 - 400 K/uL   nRBC 0.0 0.0 - 0.2 %    Comment: Performed at [facility], [place] [address] [place], [place] [address]  Comprehensive metabolic panel     Status: Abnormal   Collection Time:  06/21/20 11:04 PM  Result Value Ref Range   Sodium 138 135 - 145  mmol/L   Potassium 3.6 3.5 - 5.1 mmol/L   Chloride 107 98 - 111 mmol/L   CO2 21 (L) 22 - 32 mmol/L   Glucose, Bld 184 (H) 70 - 99 mg/dL    Comment: Glucose reference range applies only to samples taken after fasting for at least 8 hours.   BUN 11 8 - 23 mg/dL   Creatinine, Ser 1.13 0.61 - 1.24 mg/dL   Calcium 7.9 (L) 8.9 - 10.3 mg/dL   Total Protein 5.1 (L) 6.5 - 8.1 g/dL   Albumin 3.0 (L) 3.5 - 5.0 g/dL   AST 21 15 - 41 U/L   ALT 15 0 - 44 U/L   Alkaline Phosphatase 48 38 - 126 U/L   Total Bilirubin 0.6 0.3 - 1.2 mg/dL   GFR, Estimated >60 >60 mL/min    Comment: (NOTE) Calculated using the CKD-EPI Creatinine Equation (2021)    Anion gap 10 5 - 15    Comment: Performed at [facility], [place] [address], [place], [place] [address]  MRSA PCR Screening     Status: None   Collection Time: 06/21/20 11:12 PM   Specimen: Nasopharyngeal  Result Value Ref Range   MRSA by PCR NEGATIVE NEGATIVE    Comment:        The GeneXpert MRSA Assay (FDA approved for NASAL specimens only), is one component of a comprehensive MRSA colonization surveillance program. It is not intended to diagnose MRSA infection nor to guide or monitor treatment for MRSA infections. Performed at [facility], [place] [address] [place], [place] [address]   Basic metabolic panel     Status: Abnormal   Collection Time: 06/22/20  2:38 AM  Result Value Ref Range   Sodium 138 135 - 145 mmol/L   Potassium 3.7 3.5 - 5.1 mmol/L   Chloride 106 98 - 111 mmol/L   CO2 22 22 - 32 mmol/L   Glucose, Bld 147 (H) 70 - 99 mg/dL    Comment: Glucose reference range applies only to samples taken after fasting for at least 8 hours.   BUN 10 8 - 23 mg/dL   Creatinine, Ser 0.98 0.61 - 1.24 mg/dL   Calcium 8.4 (L) 8.9 - 10.3 mg/dL   GFR, Estimated >60 >60 mL/min    Comment: (NOTE) Calculated using the CKD-EPI Creatinine Equation  (2021)    Anion gap 10 5 - 15    Comment: Performed at [facility], [place] [address], [place], [place] [address]  CBC     Status: Abnormal   Collection Time: 06/22/20  2:38 AM  Result Value Ref Range   WBC 10.2 4.0 - 10.5 K/uL   RBC 4.02 (L) 4.22 - 5.81 MIL/uL   Hemoglobin 12.2 (L) 13.0 - 17.0 g/dL   HCT 36.3 (L) 39.0 - 52.0 %   MCV 90.3 80.0 - 100.0 fL   MCH 30.3 26.0 - 34.0 pg   MCHC 33.6 30.0 - 36.0 g/dL   RDW 14.1 11.5 - 15.5 %   Platelets 245 150 - 400 K/uL   nRBC 0.0 0.0 - 0.2 %    Comment: Performed at [facility], [place] [address], [place], [place] [address]  Magnesium     Status: None   Collection Time: 06/22/20  2:38 AM  Result Value Ref Range   Magnesium 1.9 1.7 - 2.4 mg/dL    Comment: Performed at [facility], [place] [address] [place], [place] [address]  Imaging / Studies: No results found.  Medications / Allergies: per chart  Antibiotics: Anti-infectives (From admission, onward)   Start     Dose/Rate Route Frequency Ordered Stop   06/21/20 2200  cefoTEtan (CEFOTAN) 2 g in sodium chloride 0.9 % 100 mL IVPB        2 g 200 mL/hr over 30 Minutes Intravenous Every 12 hours 06/21/20 1709 06/22/20 0017   06/21/20 1800  metroNIDAZOLE (FLAGYL) tablet 500 mg        500 mg Oral Daily 06/21/20 1709     06/21/20 1400  neomycin (MYCIFRADIN) tablet 1,000 mg  Status:  Discontinued       "And" Linked Group Details   1,000 mg Oral 3 times per day 06/21/20 1000 06/21/20 1653   06/21/20 1400  metroNIDAZOLE (FLAGYL) tablet 1,000 mg  Status:  Discontinued       "And" Linked Group Details   1,000 mg Oral 3 times per day 06/21/20 1000 06/21/20 1653   06/21/20 1015  cefoTEtan (CEFOTAN) 2 g in sodium chloride 0.9 % 100 mL IVPB        2 g 200 mL/hr over 30 Minutes Intravenous On call to O.R. 06/21/20 1000 06/21/20 1353        Note: Portions of this report may have been transcribed using voice recognition software. Every  effort was made to ensure accuracy; however, inadvertent computerized transcription errors may be present.   Any transcriptional errors that result from this process are unintentional.    Adin Hector, MD, FACS, MASCRS Gastrointestinal and Minimally Invasive Surgery  [facility] [address] [place], [place] [address] [phone] Fax [phone] Main/Paging  CONTACT INFORMATION: Weekday (9AM-5PM) concerns: Call [facility] main office at [phone] Weeknight (5PM-9AM) or Weekend/Holiday concerns: Check [email or web address] for General Surgery [facility] coverage (Please, do not use SecureChat as it is not reliable communication to operating surgeons for immediate patient care)      06/22/2020  7:41 AM

## 2020-06-22 NOTE — Plan of Care (Signed)
  Problem: Health Behavior/Discharge Planning: Goal: Ability to manage health-related needs will improve Outcome: Progressing   Problem: Clinical Measurements: Goal: Will remain free from infection Outcome: Progressing   Problem: Clinical Measurements: Goal: Cardiovascular complication will be avoided Outcome: Progressing   Problem: Activity: Goal: Risk for activity intolerance will decrease Outcome: Progressing   Problem: Nutrition: Goal: Adequate nutrition will be maintained Outcome: Progressing   Problem: Elimination: Goal: Will not experience complications related to urinary retention Outcome: Progressing   Problem: Skin Integrity: Goal: Risk for impaired skin integrity will decrease Outcome: Progressing

## 2020-06-22 NOTE — Progress Notes (Signed)
Pt's BP was 93/57, HR 46; pt stated "I feel like I'm passing out."  Rapid was called, and another set of vitals was taken.  BP 64/42 and HR 50. Bolus was given, Gross, MD was notified. Transfer orders placed.  See flowsheet for data.

## 2020-06-22 NOTE — TOC Initial Note (Signed)
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 Expected Discharge Plan: Home/Self Care Barriers to Discharge: No Barriers Identified   Patient Goals and CMS Choice Patient states their goals for this hospitalization and ongoing recovery are:: to go home CMS Medicare.gov Compare Post Acute Care list provided to:: Patient    Expected Discharge Plan and Services Expected Discharge Plan: Home/Self Care   Discharge Planning Services: CM Consult   Living arrangements for the past 2 months: Single Family Home                                      Prior Living Arrangements/Services Living arrangements for the past 2 months: Single Family Home Lives with:: Spouse Patient language and need for interpreter reviewed:: Yes Do you feel safe going back to the place where you live?: Yes      Need for Family Participation in Patient Care: Yes (Comment) Care giver support system in place?: Yes (comment)   Criminal Activity/Legal Involvement Pertinent to Current Situation/Hospitalization: No - Comment as needed  Activities of Daily Living Home Assistive Devices/Equipment:  Eyeglasses (reading eyeglasses) ADL Screening (condition at time of admission) Patient's cognitive ability adequate to safely complete daily activities?: Yes Is the patient deaf or have difficulty hearing?: Yes Does the patient have difficulty seeing, even when wearing glasses/contacts?: No Does the patient have difficulty concentrating, remembering, or making decisions?: Yes Patient able to express need for assistance with ADLs?: Yes Does the patient have difficulty dressing or bathing?: No Independently performs ADLs?: Yes (appropriate for developmental age) Does the patient have difficulty walking or climbing stairs?: No Weakness of Legs: None Weakness of Arms/Hands: None  Permission Sought/Granted                  Emotional Assessment Appearance:: Appears stated age Attitude/Demeanor/Rapport: Engaged Affect (typically observed): Calm Orientation: : Oriented to Place,Oriented to Self,Oriented to  Time,Oriented to Situation Alcohol / Substance Use: Not Applicable Psych Involvement: No (comment)  Admission diagnosis:  Rectal cancer ([place]) [C20] Patient Active Problem List   Diagnosis Date Noted  . Hyperglycemia 06/22/2020  . Rectal cancer s/p robotic LAR 06/21/2020 06/21/2020  . Refusal of blood transfusions as patient is Jehovah's Witness 06/19/2020  . 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 Rectal polyp 05/20/2020  . Hoarseness 02/07/2020  . Right upper quadrant abdominal pain 02/07/2020  . Epigastric pain 02/07/2020  . Urinary frequency 02/07/2020  . Other fatigue 02/07/2020  . Anemia 02/07/2020  . Fecal occult blood test positive 02/07/2020  . Hyperthyroidism 02/04/2020  .  Abnormal echocardiogram 02/05/2019  . 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 Dizziness 01/11/2019  . Overweight (BMI 25.0-29.9) 04/20/2018  . Atherosclerosis of  aorta ([place]) 12/18/2015  . Hearing aid worn 08/24/2015  . Essential hypertension, benign 08/24/2015  . Hyperlipidemia 06/29/2007   PCP:  Ma Hillock, DO Pharmacy:   [id number] [place], [place] [address] Phone: [phone] Fax: [phone]     Social Determinants of Health (SDOH) Interventions    Readmission Risk Interventions No flowsheet data found.

## 2020-06-22 NOTE — Progress Notes (Addendum)
Called by rapid response nurse, Leafy Ro.  Apparently floor nurse found patient complaining of some lightheadedness with decreased blood pressure and heart rate.  No syncope or confusion.  Not hypoxic.  Heart rate now in the 50s.  EKG with borderline sinus bradycardia.  Patient normally on a high dose of amlodipine.  Appears to be improving with IV fluid bolus.  Will transfer patient to stepdown unit for closer monitoring.  Check labs to rule out symptomatic anemia or other concerns.  Patient has moderate hypertension on numerous medications.  I had restarted amlodipine only which does not start until the morning.  Parameters in place to hold or lower dose depending on blood pressure.  Follow and reevaluate.  Adin Hector, MD, FACS, MASCRS Gastrointestinal and Minimally Invasive Surgery  [facility] [address] [place], [place] [address] [phone] Fax [phone] Main/Paging  CONTACT INFORMATION: Weekday (9AM-5PM) concerns: Call [facility] main office at [phone] Weeknight (5PM-9AM) or Weekend/Holiday concerns: Check [email or web address] for General Surgery [facility] coverage (Please, do not use SecureChat as it is not reliable communication to operating surgeons for immediate patient care)

## 2020-06-22 NOTE — Plan of Care (Signed)
  Problem: Clinical Measurements: Goal: Diagnostic test results will improve Outcome: Progressing   Problem: Health Behavior/Discharge Planning: Goal: Ability to manage health-related needs will improve Outcome: Progressing   Problem: Coping: Goal: Level of anxiety will decrease Outcome: Progressing   Problem: Pain Managment: Goal: General experience of comfort will improve Outcome: Progressing   Problem: Safety: Goal: Ability to remain free from injury will improve Outcome: Progressing   Problem: Skin Integrity: Goal: Risk for impaired skin integrity will decrease Outcome: Progressing

## 2020-06-22 NOTE — Progress Notes (Addendum)
Pt transferred to ICU/SD d/t hypotension. Pt arrived to ICU/SD, VSS. Personal belongings at bedside including pts cell phone.  Will continue to monitor

## 2020-06-23 LAB — BASIC METABOLIC PANEL
Anion gap: 8 (ref 5–15)
BUN: 13 mg/dL (ref 8–23)
CO2: 27 mmol/L (ref 22–32)
Calcium: 8.3 mg/dL — ABNORMAL LOW (ref 8.9–10.3)
Chloride: 107 mmol/L (ref 98–111)
Creatinine, Ser: 1.14 mg/dL (ref 0.61–1.24)
GFR, Estimated: >60 mL/min (ref >60)
Glucose, Bld: 105 mg/dL — ABNORMAL HIGH (ref 70–99)
Potassium: 3.6 mmol/L (ref 3.5–5.1)
Sodium: 142 mmol/L (ref 135–145)

## 2020-06-23 LAB — CBC
HCT: 31.3 % — ABNORMAL LOW (ref 39.0–52.0)
Hemoglobin: 10.4 g/dL — ABNORMAL LOW (ref 13.0–17.0)
MCH: 29.8 pg (ref 26.0–34.0)
MCHC: 33.2 g/dL (ref 30.0–36.0)
MCV: 89.7 fL (ref 80.0–100.0)
Platelets: 228 10*3/uL (ref 150–400)
RBC: 3.49 MIL/uL — ABNORMAL LOW (ref 4.22–5.81)
RDW: 14.7 % (ref 11.5–15.5)
WBC: 11.6 10*3/uL — ABNORMAL HIGH (ref 4.0–10.5)
nRBC: 0 % (ref 0.0–0.2)

## 2020-06-23 LAB — GLUCOSE, CAPILLARY
Glucose-Capillary: 93 mg/dL (ref 70–99)
Glucose-Capillary: 139 mg/dL — ABNORMAL HIGH (ref 70–99)
Glucose-Capillary: 111 mg/dL — ABNORMAL HIGH (ref 70–99)
Glucose-Capillary: 111 mg/dL — ABNORMAL HIGH (ref 70–99)

## 2020-06-23 LAB — MAGNESIUM: Magnesium: 2.1 mg/dL (ref 1.7–2.4)

## 2020-06-23 MED ORDER — PSYLLIUM 95 % PO PACK: 1.0000 | PACK | Freq: Two times a day (BID) | ORAL | Status: DC

## 2020-06-23 MED ORDER — PSYLLIUM 95 % PO PACK
1.0000 | PACK | Freq: Two times a day (BID) | ORAL | Status: DC
  Administered 2020-06-23 – 2020-06-24 (×2): 1 via ORAL
  Filled 2020-06-23 (×3): qty 1

## 2020-06-23 NOTE — Progress Notes (Signed)
Report given to [employer], pt transported to 6th floor with transport, chart, and all personal belongings.

## 2020-06-23 NOTE — Progress Notes (Signed)
Pt stable for transfer, per surgery MD on call verbal order and okay to put in transfer orders for pt to transfer to med-surg

## 2020-06-23 NOTE — Progress Notes (Signed)
Nathan [name] [id number] 05-13-1946  CARE TEAM:  PCP: Ma Hillock, DO  Outpatient Care Team: Patient Care Team: Ma Hillock, DO as PCP - General (Family Medicine) Pa, [facility] Leandrew Koyanagi, MD as Attending Physician (Orthopedic Surgery) Jonnie Finner, RN as Oncology Nurse Navigator Ladell Pier, MD as Consulting Physician (Oncology) Michael Boston, MD as Consulting Physician (General Surgery) Richardo Priest, MD as Consulting Physician (Cardiology)  Inpatient Treatment Team: Treatment Team: Attending Provider: Michael Boston, MD; Registered Nurse: Burnadette Pop, RN; Registered Nurse: Monica Martinez, RN; Registered Nurse: Henry Russel, RN   Problem List:   Principal Problem:   Rectal cancer s/p robotic LAR 06/21/2020 Active Problems:   Refusal of blood transfusions as patient is Jehovah's Witness   Hyperlipidemia   Hearing aid worn   Essential hypertension, benign   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  Located 9-11 cm from anal verge  No obvious metastatic disease on visceral parietal peritoneum or liver.  The anastomosis rests 6 cm from the anal verge by rigid proctoscopy   Assessment  Recovering  [facility] Stay = 2 days)  Plan:  Enhance recovery protocol.  Solid diet  Strict I&O.  Stable off IV fluids.  As needed boluses as needed.  Nonnarcotic pain regimen.  HTN.  Will use lisinopril and have low threshold to hold amlodipine if he becomes bradycardic again.  Mobilize.  Hyperglycemia  with underwhelming preoperative hemoglobin A1c.  Most likely due to steroid bolus perioperatively per postop nausea protocol.  Sliding scale and follow  Follow-up on pathology.  Discussed operative findings and technique.  If he has any more episodes of bradycardia or other concerns, may ask cardiology to weigh in.  -VTE prophylaxis- SCDs, etc. continue enoxaparin.  Hold if hemoglobin drifts below 8.  Hopefully not too likely.  -mobilize as tolerated to help recovery  D/C patient from hospital when patient meets criteria (anticipate in 1 day(s)):  Tolerating oral intake well Ambulating well Adequate pain control without IV medications Urinating  Having flatus Disposition planning in place   Disposition:  The patient is from: Home  Anticipate discharge to:  Home  Anticipated Date of Discharge is:  06/24/2020  Barriers to discharge:  Pending Clinical improvement (more likely than not)  Patient currently is NOT MEDICALLY STABLE for discharge from the hospital from a surgery standpoint.   30 minutes spent in review, evaluation, examination, counseling, and coordination of care.  More than 50% of that time was spent in counseling.  06/23/2020    Subjective: (Chief complaint)  TRANSFERRED  To floor in the middle of the night  Feeling much better overall.  Tolerated dysphagia 1/full liquid diet.  No nausea.  Started walking hallways.  Objective:  Vital signs:  Vitals:   06/23/20 0000 06/23/20 0140 06/23/20 0500 06/23/20 0556  BP: (!) 124/53 129/65  137/66  Pulse: 65 61  64  Resp: 10 16  20   Temp:  97.7 F (36.5 C)  98.1 F (36.7 C)  TempSrc:  Oral  Oral  SpO2: 96% 97%  96%  Weight:  85.6 kg   Height:        Last BM Date: 06/22/20  Intake/Output   Yesterday:  12/09 0701 - 12/10 0700 In: 1018.9 [P.O.:900; I.V.:118.9] Out: 1315 [Urine:1200; Drains:115] This shift:  No intake/output data recorded.  Bowel function:  Flatus: YES  BM:  YES  Drain:  Serosanguinous   Physical Exam:  General: Pt awake/alert in no acute distress.  Bright and alert.  Smiling.  Gave me for stump.  Relaxed.  Not sickly.  Not toxic.  Currently with good   Eyes: PERRL, normal EOM.  Sclera clear.  No icterus Neuro: CN II-XII intact w/o focal sensory/motor deficits. Lymph: No head/neck/groin lymphadenopathy Psych:  No delerium/psychosis/paranoia.  Oriented x 4 HENT: Normocephalic, Mucus membranes moist.  No thrush Neck: Supple, No tracheal deviation.  No obvious thyromegaly Chest: No pain to chest wall compression.  Good respiratory excursion.  No audible wheezing CV:  Pulses intact.  Regular rhythm.  No major extremity edema MS: Normal AROM mjr joints.  No obvious deformity  Abdomen: Soft.  Mildy distended.  Nontender.  No evidence of peritonitis.  No incarcerated hernias.  Ext:  No deformity.  No mjr edema.  No cyanosis Skin: No petechiae / purpurea.  No major sores.  Warm and dry    Results:   Cultures: Recent Results (from the past 720 hour(s))  SARS CORONAVIRUS 2 (TAT 6-24 HRS) Nasopharyngeal Nasopharyngeal Swab     Status: None   Collection Time: 06/20/20  8:25 AM   Specimen: Nasopharyngeal Swab  Result Value Ref Range Status   SARS Coronavirus 2 NEGATIVE NEGATIVE Final    Comment: (NOTE) SARS-CoV-2 target nucleic acids are NOT DETECTED.  The SARS-CoV-2 RNA is generally detectable in upper and lower respiratory specimens during the acute phase of infection. Negative results do not preclude SARS-CoV-2 infection, do not rule out co-infections with other pathogens, and should not be used as the sole basis for treatment or other patient management decisions. Negative results must be combined with clinical observations, patient history, and epidemiological information. The expected result is Negative.  Fact Sheet for Patients: SugarRoll.be  Fact Sheet for Healthcare  Providers: https://www.woods-mathews.com/  This test is not yet approved or cleared by the [place] FDA and  has been authorized for detection and/or diagnosis of SARS-CoV-2 by FDA under an Emergency Use Authorization (EUA). This EUA will remain  in effect (meaning this test can be used) for the duration of the COVID-19 declaration under Se ction 564(b)(1) of the Act, 21 U.S.C. section 360bbb-3(b)(1), unless the authorization is terminated or revoked sooner.  Performed at [facility], [place] [address], [place], [place] [address]   MRSA PCR Screening     Status: None   Collection Time: 06/21/20 11:12 PM   Specimen: Nasopharyngeal  Result Value Ref Range Status   MRSA by PCR NEGATIVE NEGATIVE Final    Comment:        The GeneXpert MRSA Assay (FDA approved for NASAL specimens only), is one component of a comprehensive MRSA colonization surveillance program. It is not intended to diagnose MRSA infection nor to guide or monitor treatment for MRSA infections. Performed at [facility], [place] [address], [place], [place] [address]     Labs: Results for orders placed or performed during the hospital encounter of 06/21/20 (from the past 48 hour(s))  Hemoglobin A1c     Status: None   Collection Time: 06/21/20 10:17 AM  Result Value Ref Range   Hgb A1c MFr  Bld 5.5 4.8 - 5.6 %    Comment: (NOTE) Pre diabetes:          5.7%-6.4%  Diabetes:              >6.4%  Glycemic control for   <7.0% adults with diabetes    Mean Plasma Glucose 111.15 mg/dL    Comment: Performed at [facility], [place] [address], [place], [place] [address]  Comprehensive metabolic panel per protocol     Status: Abnormal   Collection Time: 06/21/20 10:17 AM  Result Value Ref Range   Sodium 138 135 - 145 mmol/L   Potassium 3.4 (L) 3.5 - 5.1 mmol/L   Chloride 104 98 - 111 mmol/L   CO2 22 22 - 32 mmol/L   Glucose, Bld 97 70 - 99 mg/dL    Comment: Glucose reference  range applies only to samples taken after fasting for at least 8 hours.   BUN 10 8 - 23 mg/dL   Creatinine, Ser 1.15 0.61 - 1.24 mg/dL   Calcium 9.4 8.9 - 10.3 mg/dL   Total Protein 7.7 6.5 - 8.1 g/dL   Albumin 4.6 3.5 - 5.0 g/dL   AST 27 15 - 41 U/L   ALT 21 0 - 44 U/L   Alkaline Phosphatase 76 38 - 126 U/L   Total Bilirubin 0.9 0.3 - 1.2 mg/dL   GFR, Estimated >60 >60 mL/min    Comment: (NOTE) Calculated using the CKD-EPI Creatinine Equation (2021)    Anion gap 12 5 - 15    Comment: Performed at [facility], [place] [address], [place], [place] [address]  CBC per protocol     Status: None   Collection Time: 06/21/20 10:17 AM  Result Value Ref Range   WBC 7.6 4.0 - 10.5 K/uL   RBC 5.21 4.22 - 5.81 MIL/uL   Hemoglobin 15.5 13.0 - 17.0 g/dL   HCT 46.2 39.0 - 52.0 %   MCV 88.7 80.0 - 100.0 fL   MCH 29.8 26.0 - 34.0 pg   MCHC 33.5 30.0 - 36.0 g/dL   RDW 13.9 11.5 - 15.5 %   Platelets 304 150 - 400 K/uL   nRBC 0.0 0.0 - 0.2 %    Comment: Performed at [facility], [place] [address] [place], [place] [address]  Glucose, capillary     Status: Abnormal   Collection Time: 06/21/20 10:30 PM  Result Value Ref Range   Glucose-Capillary 168 (H) 70 - 99 mg/dL    Comment: Glucose reference range applies only to samples taken after fasting for at least 8 hours.  CBC     Status: Abnormal   Collection Time: 06/21/20 11:04 PM  Result Value Ref Range   WBC 11.2 (H) 4.0 - 10.5 K/uL   RBC 3.62 (L) 4.22 - 5.81 MIL/uL   Hemoglobin 11.1 (L) 13.0 - 17.0 g/dL    Comment: REPEATED TO VERIFY DLTA    HCT 32.8 (L) 39.0 - 52.0 %   MCV 90.6 80.0 - 100.0 fL   MCH 30.7 26.0 - 34.0 pg   MCHC 33.8 30.0 - 36.0 g/dL   RDW 14.1 11.5 - 15.5 %   Platelets 217 150 - 400 K/uL   nRBC 0.0 0.0 - 0.2 %    Comment: Performed at [facility], [place] [address], [place],  [address]  Comprehensive metabolic panel     Status: Abnormal   Collection Time:  06/21/20 11:04 PM  Result Value  Ref Range   Sodium 138 135 - 145 mmol/L   Potassium 3.6 3.5 - 5.1 mmol/L   Chloride 107 98 - 111 mmol/L   CO2 21 (L) 22 - 32 mmol/L   Glucose, Bld 184 (H) 70 - 99 mg/dL    Comment: Glucose reference range applies only to samples taken after fasting for at least 8 hours.   BUN 11 8 - 23 mg/dL   Creatinine, Ser 1.13 0.61 - 1.24 mg/dL   Calcium 7.9 (L) 8.9 - 10.3 mg/dL   Total Protein 5.1 (L) 6.5 - 8.1 g/dL   Albumin 3.0 (L) 3.5 - 5.0 g/dL   AST 21 15 - 41 U/L   ALT 15 0 - 44 U/L   Alkaline Phosphatase 48 38 - 126 U/L   Total Bilirubin 0.6 0.3 - 1.2 mg/dL   GFR, Estimated >60 >60 mL/min    Comment: (NOTE) Calculated using the CKD-EPI Creatinine Equation (2021)    Anion gap 10 5 - 15    Comment: Performed at [facility], [place] [address] [place], [place] [address]  MRSA PCR Screening     Status: None   Collection Time: 06/21/20 11:12 PM   Specimen: Nasopharyngeal  Result Value Ref Range   MRSA by PCR NEGATIVE NEGATIVE    Comment:        The GeneXpert MRSA Assay (FDA approved for NASAL specimens only), is one component of a comprehensive MRSA colonization surveillance program. It is not intended to diagnose MRSA infection nor to guide or monitor treatment for MRSA infections. Performed at [facility], [place] [address], [place], [place] [address]   Basic metabolic panel     Status: Abnormal   Collection Time: 06/22/20  2:38 AM  Result Value Ref Range   Sodium 138 135 - 145 mmol/L   Potassium 3.7 3.5 - 5.1 mmol/L   Chloride 106 98 - 111 mmol/L   CO2 22 22 - 32 mmol/L   Glucose, Bld 147 (H) 70 - 99 mg/dL    Comment: Glucose reference range applies only to samples taken after fasting for at least 8 hours.   BUN 10 8 - 23 mg/dL   Creatinine, Ser 0.98 0.61 - 1.24 mg/dL   Calcium 8.4 (L) 8.9 - 10.3 mg/dL   GFR, Estimated >60 >60 mL/min    Comment: (NOTE) Calculated using the CKD-EPI Creatinine Equation  (2021)    Anion gap 10 5 - 15    Comment: Performed at [facility], [place] [address] [place], [place] [address]  CBC     Status: Abnormal   Collection Time: 06/22/20  2:38 AM  Result Value Ref Range   WBC 10.2 4.0 - 10.5 K/uL   RBC 4.02 (L) 4.22 - 5.81 MIL/uL   Hemoglobin 12.2 (L) 13.0 - 17.0 g/dL   HCT 36.3 (L) 39.0 - 52.0 %   MCV 90.3 80.0 - 100.0 fL   MCH 30.3 26.0 - 34.0 pg   MCHC 33.6 30.0 - 36.0 g/dL   RDW 14.1 11.5 - 15.5 %   Platelets 245 150 - 400 K/uL   nRBC 0.0 0.0 - 0.2 %    Comment: Performed at [facility], [place] [address] [place], [place] [address]  Magnesium     Status: None   Collection Time: 06/22/20  2:38 AM  Result Value Ref Range   Magnesium 1.9 1.7 - 2.4 mg/dL    Comment: Performed at [facility], [address] [place], [place] [address]  No blood products     Status: None   Collection Time: 06/22/20  8:30 AM  Result Value Ref Range   Transfuse no blood products      TRANSFUSE NO BLOOD PRODUCTS, VERIFIED BY K.W. COLE/RN Performed at [place] [address], [place], [place] [address]   Glucose, capillary     Status: None   Collection Time: 06/22/20 12:26 PM  Result Value Ref Range   Glucose-Capillary 81 70 - 99 mg/dL    Comment: Glucose reference range applies only to samples taken after fasting for at least 8 hours.  Glucose, capillary     Status: Abnormal   Collection Time: 06/22/20  4:33 PM  Result Value Ref Range   Glucose-Capillary 124 (H) 70 - 99 mg/dL    Comment: Glucose reference range applies only to samples taken after fasting for at least 8 hours.  Glucose, capillary     Status: None   Collection Time: 06/22/20  7:23 PM  Result Value Ref Range   Glucose-Capillary 95 70 - 99 mg/dL    Comment: Glucose reference range applies only to samples taken after fasting for at least 8 hours.  Glucose, capillary     Status: Abnormal   Collection Time: 06/22/20 11:33  PM  Result Value Ref Range   Glucose-Capillary 111 (H) 70 - 99 mg/dL    Comment: Glucose reference range applies only to samples taken after fasting for at least 8 hours.  CBC     Status: Abnormal   Collection Time: 06/23/20  6:30 AM  Result Value Ref Range   WBC 11.6 (H) 4.0 - 10.5 K/uL   RBC 3.49 (L) 4.22 - 5.81 MIL/uL   Hemoglobin 10.4 (L) 13.0 - 17.0 g/dL   HCT 31.3 (L) 39.0 - 52.0 %   MCV 89.7 80.0 - 100.0 fL   MCH 29.8 26.0 - 34.0 pg   MCHC 33.2 30.0 - 36.0 g/dL   RDW 14.7 11.5 - 15.5 %   Platelets 228 150 - 400 K/uL   nRBC 0.0 0.0 - 0.2 %    Comment: Performed at [facility], [place] [address], [place], [place] [address]    Imaging / Studies: No results found.  Medications / Allergies: per chart  Antibiotics: Anti-infectives (From admission, onward)   Start     Dose/Rate Route Frequency Ordered Stop   06/21/20 2200  cefoTEtan (CEFOTAN) 2 g in sodium chloride 0.9 % 100 mL IVPB        2 g 200 mL/hr over 30 Minutes Intravenous Every 12 hours 06/21/20 1709 06/22/20 0017   06/21/20 1800  metroNIDAZOLE (FLAGYL) tablet 500 mg        500 mg Oral Daily 06/21/20 1709     06/21/20 1400  neomycin (MYCIFRADIN) tablet 1,000 mg  Status:  Discontinued       "And" Linked Group Details   1,000 mg Oral 3 times per day 06/21/20 1000 06/21/20 1653   06/21/20 1400  metroNIDAZOLE (FLAGYL) tablet 1,000 mg  Status:  Discontinued       "And" Linked Group Details   1,000 mg Oral 3 times per day 06/21/20 1000 06/21/20 1653   06/21/20 1015  cefoTEtan (CEFOTAN) 2 g in sodium chloride 0.9 % 100 mL IVPB        2 g 200 mL/hr over 30 Minutes Intravenous On call to O.R. 06/21/20 1000 06/21/20 1353        Note:  Portions of this report may have been transcribed using voice recognition software. Every effort was made to ensure accuracy; however, inadvertent computerized transcription errors may be present.   Any transcriptional errors that result from this process are  unintentional.    Adin Hector, MD, FACS, MASCRS Gastrointestinal and Minimally Invasive Surgery  [facility] Surgery [address], [place], [place] [address] [phone] Fax [phone] Main/Paging  CONTACT INFORMATION: Weekday (9AM-5PM) concerns: Call [facility] main office at [phone] Weeknight (5PM-9AM) or Weekend/Holiday concerns: Check [email or web address] for General Surgery [facility] coverage (Please, do not use SecureChat as it is not reliable communication to operating surgeons for immediate patient care)      06/23/2020  7:37 AM

## 2020-06-24 LAB — GLUCOSE, CAPILLARY: Glucose-Capillary: 93 mg/dL (ref 70–99)

## 2020-06-24 NOTE — Progress Notes (Signed)
3 Days Post-Op   Subjective/Chief Complaint: Doing great, ambulating without issue, having flatus and stool, pain controlled, voiding   Objective: Vital signs in last 24 hours: Temp:  [98.5 F (36.9 C)-99.2 F (37.3 C)] 98.5 F (36.9 C) (12/11 0519) Pulse Rate:  [67-80] 77 (12/11 0519) Resp:  [14-18] 17 (12/11 0519) BP: (123-140)/(61-73) 140/73 (12/11 0519) SpO2:  [95 %] 95 % (12/11 0519) Last BM Date: 06/22/20  Intake/Output from previous day: 12/10 0701 - 12/11 0700 In: 19.4 [I.V.:19.4] Out: 1095 [Urine:1000; Drains:95] Intake/Output this shift: No intake/output data recorded.  GI: soft nt/nd incisions clean,drain serosang  Lab Results:  Recent Labs    06/22/20 0238 06/23/20 0630  WBC 10.2 11.6*  HGB 12.2* 10.4*  HCT 36.3* 31.3*  PLT 245 228   BMET Recent Labs    06/22/20 0238 06/23/20 0630  NA 138 142  K 3.7 3.6  CL 106 107  CO2 22 27  GLUCOSE 147* 105*  BUN 10 13  CREATININE 0.98 1.14  CALCIUM 8.4* 8.3*   PT/INR No results for input(s): LABPROT, INR in the last 72 hours. ABG No results for input(s): PHART, HCO3 in the last 72 hours.  Invalid input(s): PCO2, PO2  Studies/Results: No results found.  Anti-infectives: Anti-infectives (From admission, onward)   Start     Dose/Rate Route Frequency Ordered Stop   06/21/20 2200  cefoTEtan (CEFOTAN) 2 g in sodium chloride 0.9 % 100 mL IVPB        2 g 200 mL/hr over 30 Minutes Intravenous Every 12 hours 06/21/20 1709 06/22/20 0017   06/21/20 1800  metroNIDAZOLE (FLAGYL) tablet 500 mg        500 mg Oral Daily 06/21/20 1709     06/21/20 1400  neomycin (MYCIFRADIN) tablet 1,000 mg  Status:  Discontinued       "And" Linked Group Details   1,000 mg Oral 3 times per day 06/21/20 1000 06/21/20 1653   06/21/20 1400  metroNIDAZOLE (FLAGYL) tablet 1,000 mg  Status:  Discontinued       "And" Linked Group Details   1,000 mg Oral 3 times per day 06/21/20 1000 06/21/20 1653   06/21/20 1015  cefoTEtan (CEFOTAN) 2  g in sodium chloride 0.9 % 100 mL IVPB        2 g 200 mL/hr over 30 Minutes Intravenous On call to O.R. 06/21/20 1000 06/21/20 1353      Assessment/Plan: POD 3 LAR -dc home today -drain out prior to dc -fu Dr Reva Bores 06/24/2020

## 2020-06-27 LAB — SURGICAL PATHOLOGY

## 2020-06-27 NOTE — Discharge Summary (Signed)
Physician Discharge Summary    Patient ID: Nathan [name] MRN: [id number] DOB/AGE: 74-09-1945  74 y.o.  Patient Care Team: Ma Hillock, DO as PCP - General (Family Medicine) [place], [facility] Leandrew Koyanagi, MD as Attending Physician (Orthopedic Surgery) Jonnie Finner, RN as Oncology Nurse Navigator Ladell Pier, MD as Consulting Physician (Oncology) Michael Boston, MD as Consulting Physician (General Surgery) Richardo Priest, MD as Consulting Physician (Cardiology) Armbruster, Carlota Raspberry, MD as Consulting Physician (Gastroenterology)  Admit date: 06/21/2020  Discharge date: 06/24/2020 Hospital Stay = 3 days    Discharge Diagnoses:  Principal Problem:   Rectal cancer s/p robotic LAR 06/21/2020 Active Problems:   Refusal of blood transfusions as patient is Jehovah's Witness   Hyperlipidemia   Hearing aid worn   Essential hypertension, benign   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 Located 9-11 cm from anal verge  No obvious metastatic disease on visceral parietal peritoneum or liver.  The anastomosis rests6cm from the anal verge by rigid proctoscopy  Consults: None  Hospital Course:   The patient underwent the surgery above.  Postoperatively, the patient gradually mobilized and advanced to a solid diet.  Pain and other symptoms were treated aggressively.    By the time of discharge, the patient was walking well the hallways, eating food, having flatus.  Pain was well-controlled on an oral medications.  Based on meeting  discharge criteria and continuing to recover, I felt it was safe for the patient to be discharged from the hospital to further recover with close followup. Postoperative recommendations were discussed in detail.  They are written as well.  Discharged Condition: good  Discharge Exam: Blood pressure 140/73, pulse 77, temperature 98.5 F (36.9 C), temperature source Oral, resp. rate 17, height 5\' 10"  (1.778 m), weight 85.6 kg, SpO2 95 %.  General: Pt awake/alert/oriented x4 in No acute distress Eyes: PERRL, normal EOM.  Sclera clear.  No icterus Neuro: CN II-XII intact w/o focal sensory/motor deficits. Lymph: No head/neck/groin lymphadenopathy Psych:  No delerium/psychosis/paranoia HENT: Normocephalic, Mucus membranes moist.  No thrush Neck: Supple, No tracheal deviation Chest: No chest wall pain w good excursion CV:  Pulses intact.  Regular rhythm MS: Normal AROM mjr joints.  No obvious deformity Abdomen: Soft.  Nondistended.  Mildly tender at incisions only.  No evidence of peritonitis.  No incarcerated hernias. Ext:  SCDs BLE.  No mjr edema.  No cyanosis Skin: No petechiae / purpura   Disposition:    Follow-up Information    Michael Boston, MD. Schedule an appointment as soon as possible for a visit in 3 weeks.   Specialty: General Surgery Why: To follow up after your operation, To follow up after your hospital stay Contact information: [place] [address] [phone]               Discharge disposition: 01-Home or Self Care       Discharge Instructions    Call MD for:   Complete by: As directed    FEVER > 101.5 F  (temperatures < 101.5 F are not significant)   Call MD for:  extreme fatigue   Complete by: As directed    Call MD for:  persistant dizziness or light-headedness   Complete by: As directed    Call MD for:  persistant nausea and vomiting   Complete by: As directed    Call MD for:  redness, tenderness, or signs of  infection (pain, swelling, redness, odor or green/yellow discharge around incision site)   Complete by: As directed    Call MD for:  severe uncontrolled pain   Complete by: As directed    Diet - low sodium heart healthy   Complete by: As directed    Start with a bland diet such as soups, liquids, starchy foods, low fat foods, etc. the first few days at home. Gradually advance to a solid, low-fat, high fiber diet by the end of the first week at home.   Add a fiber supplement to your diet (Metamucil, etc) If you feel full, bloated, or constipated, stay on a full liquid or pureed/blenderized diet for a few days until you feel better and are no longer constipated.   Discharge instructions   Complete by: As directed    See Discharge Instructions If you are not getting better after two weeks or are noticing you are getting worse, contact our office [phone] for further advice.  We may need to adjust your medications, re-evaluate you in the office, send you to the emergency room, or see what other things we can do to help. The clinic staff is available to answer your questions during regular business hours (8:30am-5pm).  Please don't hesitate to call and ask to speak to one of our nurses for clinical concerns.    A surgeon from [facility] is always on call at the hospitals 24 hours/day If you have a medical emergency, go to the nearest emergency room or call 911.   Discharge wound care:   Complete by: As directed    It is good for closed incisions and even open wounds to be washed every day.  Shower every day.  Short baths are fine.  Wash the incisions and wounds clean with soap & water.    You may leave closed incisions open to air if it is dry.   You may cover the incision with clean gauze & replace it after your daily shower for comfort.  TEGADERM:  You have clear gauze band-aid dressings over your closed incision(s).  Remove the dressings 3 days after surgery.   Driving  Restrictions   Complete by: As directed    You may drive when: - you are no longer taking narcotic prescription pain medication - you can comfortably wear a seatbelt - you can safely make sudden turns/stops without pain.   Increase activity slowly   Complete by: As directed    Start light daily activities --- self-care, walking, climbing stairs- beginning the day after surgery.  Gradually increase activities as tolerated.  Control your pain to be active.  Stop when you are tired.  Ideally, walk several times a day, eventually an hour a day.   Most people are back to most day-to-day activities in a few weeks.  It takes 4-6 weeks to get back to unrestricted, intense activity. If you can walk 30 minutes without difficulty, it is safe to try more intense activity such as jogging, treadmill, bicycling, low-impact aerobics, swimming, etc. Save the most intensive and strenuous activity for last (Usually 4-8 weeks after surgery) such as sit-ups, heavy lifting, contact sports, etc.  Refrain  from any intense heavy lifting or straining until you are off narcotics for pain control.  You will have off days, but things should improve week-by-week. DO NOT PUSH THROUGH PAIN.  Let pain be your guide: If it hurts to do something, don't do it.   Lifting restrictions   Complete by: As directed    If you can walk 30 minutes without difficulty, it is safe to try more intense activity such as jogging, treadmill, bicycling, low-impact aerobics, swimming, etc. Save the most intensive and strenuous activity for last (Usually 4-8 weeks after surgery) such as sit-ups, heavy lifting, contact sports, etc.   Refrain from any intense heavy lifting or straining until you are off narcotics for pain control.  You will have off days, but things should improve week-by-week. DO NOT PUSH THROUGH PAIN.  Let pain be your guide: If it hurts to do something, don't do it.  Pain is your body warning you to avoid that activity for another week  until the pain goes down.   May shower / Bathe   Complete by: As directed    May walk up steps   Complete by: As directed    Remove dressing in 72 hours   Complete by: As directed    Make sure all dressings are removed by the third day after surgery.  Leave incisions open to air.  OK to cover incisions with gauze or bandages as desired   Sexual Activity Restrictions   Complete by: As directed    You may have sexual intercourse when it is comfortable. If it hurts to do something, stop.      Allergies as of 06/24/2020      Reactions   Morphine And Related Other (See Comments)   Severe drop in blood pressure   Citalopram Hydrobromide Other (See Comments)   PATIENT PREFERENCE: "Just ineffective"   Sertraline Hcl Other (See Comments)   Headache      Medication List    TAKE these medications   amLODipine 10 MG tablet Commonly known as: NORVASC Take 1 tablet (10 mg total) by mouth daily.   b complex vitamins tablet Take 1 tablet by mouth every other day.   BRAINSTRONG MEMORY SUPPORT PO Take 2 tablets by mouth daily.   fluticasone 50 MCG/ACT nasal spray Commonly known as: FLONASE SPRAY 2 SPRAYS INTO EACH NOSTRIL EVERY DAY What changed: See the new instructions.   lisinopril 30 MG tablet Commonly known as: ZESTRIL Take 1 tablet (30 mg total) by mouth daily. Needs office visit prior to any additional refills What changed: additional instructions   metroNIDAZOLE 500 MG tablet Commonly known as: FLAGYL Take 500 mg by mouth daily.   MIRALAX PO Take 17 g by mouth daily.   Multiple Vitamin tablet Take 1 tablet by mouth daily.   omeprazole 20 MG capsule Commonly known as: PRILOSEC TAKE 1 CAPSULE BY MOUTH TWICE A DAY What changed: when to take this   PROBIOTIC-10 PO Take 1 tablet by mouth daily.   SLEEP AID PO Take 1 tablet by mouth at bedtime. herbalife   traMADol 50 MG tablet Commonly known as: ULTRAM Take 1-2 tablets (50-100 mg total) by mouth every 6 (six)  hours as needed for moderate pain or severe pain.   Valerian 500 MG Caps Take 1,000 mg by mouth at bedtime.            Discharge Care Instructions  (From admission, onward)         Start  Ordered   06/21/20 0000  Discharge wound care:       Comments: It is good for closed incisions and even open wounds to be washed every day.  Shower every day.  Short baths are fine.  Wash the incisions and wounds clean with soap & water.    You may leave closed incisions open to air if it is dry.   You may cover the incision with clean gauze & replace it after your daily shower for comfort.  TEGADERM:  You have clear gauze band-aid dressings over your closed incision(s).  Remove the dressings 3 days after surgery.   06/21/20 1142          Significant Diagnostic Studies:  No results found for this or any previous visit (from the past 72 hour(s)).  No results found.  Past Medical History:  Diagnosis Date  . Adrenal mass ([place]) 2018   Urology fully evaluated; benign  . Allergy    hay fever  . Anemia    resolved spontaneously  . Anxiety   . Cataract   . Cervicalgia   . Complication of anesthesia    hypoglycemia, low blood pressure, pt. reports it dropped to zero- post op MORPHINE , then taken ICU due to either the morphine or hypoglycemia . In addition post op from cataract surgery- 07/2015, experienced an episode of hypoglycemia    . Depressive disorder, not elsewhere classified   . Diverticulosis   . Elevated prostate specific antigen (PSA) 2008   Dr Jeffie Pollock  . Gallstone   . GERD (gastroesophageal reflux disease)   . H/O cardiovascular stress test >10 yrs.   told that its wnl. Told to drink less coffee  . Hematuria 2018   Hematuria with enlarged prostate, kidney cyst, adrenal adenoma--> followed by urology with cystoscopy and repeat CT, benign workup.  [name] Hx of migraines   . Hypertension   . Hyperthyroidism    Treated with medication no longer needed at this time  .  Hypoglycemia   . IBS (irritable bowel syndrome)    patient not aware  . Inguinal hernia 12/2015   Bilateral small indirect hernias on CT, umbilical hernia also present.  . Kidney cyst, acquired 2018   Followed by urology, benign.  . Low back pain   . Macular degeneration (senile) of retina, unspecified    2 different opinions from 2 different Ophth  . Osteoarthrosis, unspecified whether generalized or localized, unspecified site   . Other and unspecified hyperlipidemia   . Prostatitis 2008  . Rectal cancer ([place])   . Refusal of blood transfusions as patient is Jehovah's Witness   . Segmental and somatic dysfunction of cervical region   . Segmental and somatic dysfunction of lumbar region   . Segmental and somatic dysfunction of sacral region   . Segmental and somatic dysfunction of thoracic region   . Spondylosis of lumbar spine   . Strain of pelvis   . 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    Past Surgical History:  Procedure Laterality Date  . BIOPSY  05/24/2020   Procedure: BIOPSY;  Surgeon: Rush Landmark Telford Nab., MD;  Location: [place];  Service: Gastroenterology;;  . CATARACT EXTRACTION, BILATERAL    . COLONOSCOPY  2012   Dr Olevia Perches  . COLONOSCOPY WITH PROPOFOL N/A 05/24/2020   Procedure: COLONOSCOPY WITH PROPOFOL;  Surgeon: Rush Landmark Telford Nab., MD;  Location: [place];  Service: Gastroenterology;  Laterality: N/A;  . CYSTOSCOPY  for hematuria  . EUS N/A 05/24/2020   Procedure: LOWER ENDOSCOPIC ULTRASOUND (EUS);  Surgeon: Irving Copas., MD;  Location: [place];  Service: Gastroenterology;  Laterality: N/A;  . hydrocoelectomy  2003   Dr Jeffie Pollock  . KNEE ARTHROSCOPY Left 1994  . OSTOMY N/A 06/21/2020   Procedure: POSSIBLE OSTOMY;  Surgeon: Michael Boston, MD;  Location: [facility];  Service: General;  Laterality: N/A;  . POLYPECTOMY  05/24/2020   Procedure: POLYPECTOMY;  Surgeon: Irving Copas., MD;  Location:  [facility];  Service: Gastroenterology;;  . PROCTOSCOPY N/A 06/21/2020   Procedure: RIGID PROCTOSCOPY;  Surgeon: Michael Boston, MD;  Location: [facility];  Service: General;  Laterality: N/A;  . prostate nodule resection  2003  . TONSILLECTOMY    . TOTAL KNEE ARTHROPLASTY  2009  . TOTAL KNEE ARTHROPLASTY Right 06/27/2016   Procedure: TOTAL KNEE ARTHROPLASTY;  Surgeon: Leandrew Koyanagi, MD;  Location: [place];  Service: Orthopedics;  Laterality: Right;  . XI ROBOTIC ASSISTED LOWER ANTERIOR RESECTION N/A 06/21/2020   Procedure: XI ROBOTIC ASSISTED LOWER ANTERIOR RECTOSIGMOID RESECTION, BILATERAL TAP BLOCK;  Surgeon: Michael Boston, MD;  Location: [facility];  Service: General;  Laterality: N/A;    Social History   Socioeconomic History  . Marital status: Married    Spouse name: Not on file  . Number of children: 4  . Years of education: Not on file  . Highest education level: Not on file  Occupational History  . Not on file  Tobacco Use  . Smoking status: Former Smoker    Quit date: 07/15/1968    Years since quitting: 51.9  . Smokeless tobacco: Never Used  . Tobacco comment: smoked [place], up to 1ppd  Vaping Use  . Vaping Use: Never used  Substance and Sexual Activity  . Alcohol use: Not Currently    Comment: on occasion  . Drug use: No  . Sexual activity: Yes    Partners: Female    Birth control/protection: None  Other Topics Concern  . Not on file  Social History Narrative   Married. Wife's name is [name]. Full time employed, Pensions consultant.   Drinks caffeinated beverages. Take a multivitamin. Wears his seatbelt.   Exercises at least 3 times a week. Is not a strict vegetarian, but only eats meat 1-2 times a week   Wears a hearing aid. Does not wear dentures or use an assistive walking device.   There is a smoke detector in his home.   He feels safe in his relationships.   Social Determinants of Health   Financial Resource Strain: Not on file  Food Insecurity: Not on  file  Transportation Needs: Not on file  Physical Activity: Not on file  Stress: Not on file  Social Connections: Not on file  Intimate Partner Violence: Not on file    Family History  Problem Relation Age of Onset  . Lung cancer Father        smoker  . Alcohol abuse Father   . Tuberculosis Father   . Heart disease Father   . Early death Father   . Diabetes Other   . Heart disease Mother   . Heart attack Brother   . Stroke Neg Hx   . Thyroid disease Neg Hx   . Colon cancer Neg Hx   . Stomach cancer Neg Hx   . Rectal cancer Neg Hx   . Esophageal cancer Neg Hx   . Inflammatory bowel disease Neg Hx   .  Liver disease Neg Hx   . Pancreatic cancer Neg Hx     No current facility-administered medications for this encounter.   Current Outpatient Medications  Medication Sig Dispense Refill  . amLODipine (NORVASC) 10 MG tablet Take 1 tablet (10 mg total) by mouth daily. 90 tablet 1  . b complex vitamins tablet Take 1 tablet by mouth every other day.    . Doxylamine Succinate, Sleep, (SLEEP AID PO) Take 1 tablet by mouth at bedtime. herbalife    . fluticasone (FLONASE) 50 MCG/ACT nasal spray SPRAY 2 SPRAYS INTO EACH NOSTRIL EVERY DAY (Patient taking differently: Place 2 sprays into both nostrils daily. ) 16 mL 5  . lisinopril (ZESTRIL) 30 MG tablet Take 1 tablet (30 mg total) by mouth daily. Needs office visit prior to any additional refills (Patient taking differently: Take 30 mg by mouth daily. ) 90 tablet 1  . metroNIDAZOLE (FLAGYL) 500 MG tablet Take 500 mg by mouth daily.    . Misc Natural Products (BRAINSTRONG MEMORY SUPPORT PO) Take 2 tablets by mouth daily.    . Multiple Vitamin tablet Take 1 tablet by mouth daily.     [name] omeprazole (PRILOSEC) 20 MG capsule TAKE 1 CAPSULE BY MOUTH TWICE A DAY (Patient taking differently: Take 20 mg by mouth 2 (two) times daily before a meal. ) 180 capsule 1  . Polyethylene Glycol 3350 (MIRALAX PO) Take 17 g by mouth daily.    . Probiotic  Product (PROBIOTIC-10 PO) Take 1 tablet by mouth daily.    . Valerian 500 MG CAPS Take 1,000 mg by mouth at bedtime.    . traMADol (ULTRAM) 50 MG tablet Take 1-2 tablets (50-100 mg total) by mouth every 6 (six) hours as needed for moderate pain or severe pain. 20 tablet 0     Allergies  Allergen Reactions  . Morphine And Related Other (See Comments)    Severe drop in blood pressure  . Citalopram Hydrobromide Other (See Comments)    PATIENT PREFERENCE: "Just ineffective"  . Sertraline Hcl Other (See Comments)    Headache    Signed: Morton Peters, MD, FACS, MASCRS Gastrointestinal and Minimally Invasive Surgery  [facility] [address] [place], [place] [address] [phone] Fax [phone] Main/Paging  CONTACT INFORMATION: Weekday (9AM-5PM) concerns: Call [facility] main office at [phone] Weeknight (5PM-9AM) or Weekend/Holiday concerns: Check [email or web address] for General Surgery [facility] coverage (Please, do not use SecureChat as it is not reliable communication to operating surgeons for immediate patient care)      06/27/2020, 8:22 AM

## 2020-07-19 ENCOUNTER — Ambulatory Visit: Payer: Self-pay | Admitting: Surgery

## 2020-07-27 ENCOUNTER — Inpatient Hospital Stay: Payer: Medicare Other | Attending: Oncology | Admitting: Oncology

## 2020-07-27 ENCOUNTER — Telehealth: Payer: Self-pay | Admitting: *Deleted

## 2020-07-27 NOTE — Telephone Encounter (Signed)
"  No show" today. Left VM for wife to confirm he is doing well post surgery and if he is willing to return to see Dr. Benay Spice. Requested to call and ask for Dr. Gearldine Shown nurse.

## 2020-08-08 NOTE — Progress Notes (Signed)
I spoke with Dr. Clyda Greener nurse ([facility]) to see if he needs follow up with medical oncology and per Dr. Johney Maine he does not.  They plan to repeat colonoscopy in one year.

## 2020-09-20 ENCOUNTER — Ambulatory Visit (INDEPENDENT_AMBULATORY_CARE_PROVIDER_SITE_OTHER): Payer: Medicare Other

## 2020-09-20 VITALS — Ht 71.0 in | Wt 180.0 lb

## 2020-09-20 DIAGNOSIS — Z0000 Encounter for general adult medical examination without abnormal findings: Secondary | ICD-10-CM

## 2020-09-20 NOTE — Progress Notes (Signed)
Subjective:   Nathan [name] is a 75 y.o. male who presents for Medicare Annual/Subsequent preventive examination.  I connected with [place] today by telephone and verified that I am speaking with the correct person using two identifiers. Location patient: home Location provider: work Persons participating in the virtual visit: patient, Marine scientist.    I discussed the limitations, risks, security and privacy concerns of performing an evaluation and management service by telephone and the availability of in person appointments. I also discussed with the patient that there may be a patient responsible charge related to this service. The patient expressed understanding and verbally consented to this telephonic visit.    Interactive audio and video telecommunications were attempted between this provider and patient, however failed, due to patient having technical difficulties OR patient did not have access to video capability.  We continued and completed visit with audio only.  Some vital signs may be absent or patient reported.   Time Spent with patient on telephone encounter: 25 minutes   Review of Systems     Cardiac Risk Factors include: male gender;advanced age (>72men, >62 women);dyslipidemia     Objective:    Today's Vitals   09/20/20 0901  Weight: 180 lb (81.6 kg)  Height: 5\' 11"  (1.803 m)   Body mass index is 25.1 kg/m.  Advanced Directives 09/20/2020 06/20/2020 06/12/2020 05/24/2020 02/03/2019 09/30/2018 06/02/2017  Does Patient Have a Medical Advance Directive? Yes Yes Yes Yes No No No  Type of Paramedic of [place];Living will [place];Living will Living will - - Healthcare Power of Attorney  Does patient want to make changes to medical advance directive? - No - Patient declined No - Patient declined - - - Yes (MAU/Ambulatory/Procedural Areas - Information given)  Copy of [place] in  Chart? Yes - validated most recent copy scanned in chart (See row information) No - copy requested Yes - validated most recent copy scanned in chart (See row information) - - - No - copy requested  Would patient like information on creating a medical advance directive? - - - - No - Patient declined No - Patient declined -    Current Medications (verified) Outpatient Encounter Medications as of 09/20/2020  Medication Sig  . amLODipine (NORVASC) 10 MG tablet Take 1 tablet (10 mg total) by mouth daily.  [name] b complex vitamins tablet Take 1 tablet by mouth every other day.  . Doxylamine Succinate, Sleep, (SLEEP AID PO) Take 1 tablet by mouth at bedtime. herbalife  . fluticasone (FLONASE) 50 MCG/ACT nasal spray SPRAY 2 SPRAYS INTO EACH NOSTRIL EVERY DAY (Patient taking differently: Place 2 sprays into both nostrils daily.)  . lisinopril (ZESTRIL) 30 MG tablet Take 1 tablet (30 mg total) by mouth daily. Needs office visit prior to any additional refills (Patient taking differently: Take 30 mg by mouth daily.)  . Misc Natural Products (BRAINSTRONG MEMORY SUPPORT PO) Take 2 tablets by mouth daily.  . Multiple Vitamin tablet Take 1 tablet by mouth daily.   [name] omeprazole (PRILOSEC) 20 MG capsule TAKE 1 CAPSULE BY MOUTH TWICE A DAY (Patient taking differently: Take 20 mg by mouth 2 (two) times daily before a meal.)  . Polyethylene Glycol 3350 (MIRALAX PO) Take 17 g by mouth daily.  . Probiotic Product (PROBIOTIC-10 PO) Take 1 tablet by mouth daily.  . Valerian 500 MG CAPS Take 1,000 mg by mouth at bedtime.  . metroNIDAZOLE (FLAGYL) 500 MG tablet Take 500  mg by mouth daily. (Patient not taking: Reported on 09/20/2020)  . traMADol (ULTRAM) 50 MG tablet Take 1-2 tablets (50-100 mg total) by mouth every 6 (six) hours as needed for moderate pain or severe pain. (Patient not taking: Reported on 09/20/2020)   No facility-administered encounter medications on file as of 09/20/2020.    Allergies (verified) Morphine and  related, Citalopram hydrobromide, and Sertraline hcl   History: Past Medical History:  Diagnosis Date  . Adrenal mass ([place]) 2018   Urology fully evaluated; benign  . Allergy    hay fever  . Anemia    resolved spontaneously  . Anxiety   . Cataract   . Cervicalgia   . Complication of anesthesia    hypoglycemia, low blood pressure, pt. reports it dropped to zero- post op MORPHINE , then taken ICU due to either the morphine or hypoglycemia . In addition post op from cataract surgery- 07/2015, experienced an episode of hypoglycemia    . Depressive disorder, not elsewhere classified   . Diverticulosis   . Elevated prostate specific antigen (PSA) 2008   Dr Jeffie Pollock  . Gallstone   . GERD (gastroesophageal reflux disease)   . H/O cardiovascular stress test >10 yrs.   told that its wnl. Told to drink less coffee  . Hematuria 2018   Hematuria with enlarged prostate, kidney cyst, adrenal adenoma--> followed by urology with cystoscopy and repeat CT, benign workup.  [name] Hx of migraines   . Hypertension   . Hyperthyroidism    Treated with medication no longer needed at this time  . Hypoglycemia   . IBS (irritable bowel syndrome)    patient not aware  . Inguinal hernia 12/2015   Bilateral small indirect hernias on CT, umbilical hernia also present.  . Kidney cyst, acquired 2018   Followed by urology, benign.  . Low back pain   . Macular degeneration (senile) of retina, unspecified    2 different opinions from 2 different Ophth  . Osteoarthrosis, unspecified whether generalized or localized, unspecified site   . Other and unspecified hyperlipidemia   . Prostatitis 2008  . Rectal cancer ([place])   . Refusal of blood transfusions as patient is Jehovah's Witness   . Segmental and somatic dysfunction of cervical region   . Segmental and somatic dysfunction of lumbar region   . Segmental and somatic dysfunction of sacral region   . Segmental and somatic dysfunction of thoracic region   .  Spondylosis of lumbar spine   . Strain of pelvis   . 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   Past Surgical History:  Procedure Laterality Date  . BIOPSY  05/24/2020   Procedure: BIOPSY;  Surgeon: Rush Landmark Telford Nab., MD;  Location: [place];  Service: Gastroenterology;;  . CATARACT EXTRACTION, BILATERAL    . COLONOSCOPY  2012   Dr Olevia Perches  . COLONOSCOPY WITH PROPOFOL N/A 05/24/2020   Procedure: COLONOSCOPY WITH PROPOFOL;  Surgeon: Rush Landmark Telford Nab., MD;  Location: [place];  Service: Gastroenterology;  Laterality: N/A;  . CYSTOSCOPY     for hematuria  . EUS N/A 05/24/2020   Procedure: LOWER ENDOSCOPIC ULTRASOUND (EUS);  Surgeon: Irving Copas., MD;  Location: [place];  Service: Gastroenterology;  Laterality: N/A;  . hydrocoelectomy  2003   Dr Jeffie Pollock  . KNEE ARTHROSCOPY Left 1994  . OSTOMY N/A 06/21/2020   Procedure: POSSIBLE OSTOMY;  Surgeon: Michael Boston, MD;  Location: [facility];  Service: General;  Laterality: N/A;  .  POLYPECTOMY  05/24/2020   Procedure: POLYPECTOMY;  Surgeon: Mansouraty, Telford Nab., MD;  [facility];  Service: Gastroenterology;;  . PROCTOSCOPY N/A 06/21/2020   Procedure: RIGID PROCTOSCOPY;  Surgeon: Michael Boston, MD;  Location: [facility];  Service: General;  Laterality: N/A;  . prostate nodule resection  2003  . TONSILLECTOMY    . TOTAL KNEE ARTHROPLASTY  2009  . TOTAL KNEE ARTHROPLASTY Right 06/27/2016   Procedure: TOTAL KNEE ARTHROPLASTY;  Surgeon: Leandrew Koyanagi, MD;  Location: [place];  Service: Orthopedics;  Laterality: Right;  . XI ROBOTIC ASSISTED LOWER ANTERIOR RESECTION N/A 06/21/2020   Procedure: XI ROBOTIC ASSISTED LOWER ANTERIOR RECTOSIGMOID RESECTION, BILATERAL TAP BLOCK;  Surgeon: Michael Boston, MD;  Location: [facility];  Service: General;  Laterality: N/A;   Family History  Problem Relation Age of Onset  . Lung cancer Father        smoker  . Alcohol abuse Father   . Tuberculosis Father    . Heart disease Father   . Early death Father   . Diabetes Other   . Heart disease Mother   . Heart attack Brother   . Stroke Neg Hx   . Thyroid disease Neg Hx   . Colon cancer Neg Hx   . Stomach cancer Neg Hx   . Rectal cancer Neg Hx   . Esophageal cancer Neg Hx   . Inflammatory bowel disease Neg Hx   . Liver disease Neg Hx   . Pancreatic cancer Neg Hx    Social History   Socioeconomic History  . Marital status: Married    Spouse name: Not on file  . Number of children: 4  . Years of education: Not on file  . Highest education level: Not on file  Occupational History  . Not on file  Tobacco Use  . Smoking status: Former Smoker    Quit date: 07/15/1968    Years since quitting: 52.2  . Smokeless tobacco: Never Used  . Tobacco comment: smoked [place], up to 1ppd  Vaping Use  . Vaping Use: Never used  Substance and Sexual Activity  . Alcohol use: Not Currently    Comment: on occasion  . Drug use: No  . Sexual activity: Yes    Partners: Female    Birth control/protection: None  Other Topics Concern  . Not on file  Social History Narrative   Married. Wife's name is [name]. Full time employed, Pensions consultant.   Drinks caffeinated beverages. Take a multivitamin. Wears his seatbelt.   Exercises at least 3 times a week. Is not a strict vegetarian, but only eats meat 1-2 times a week   Wears a hearing aid. Does not wear dentures or use an assistive walking device.   There is a smoke detector in his home.   He feels safe in his relationships.   Social Determinants of Health   Financial Resource Strain: Low Risk   . Difficulty of Paying Living Expenses: Not hard at all  Food Insecurity: No Food Insecurity  . Worried About Charity fundraiser in the Last Year: Never true  . Ran Out of Food in the Last Year: Never true  Transportation Needs: No Transportation Needs  . Lack of Transportation (Medical): No  . Lack of Transportation (Non-Medical): No   Physical Activity: Sufficiently Active  . Days of Exercise per Week: 4 days  . Minutes of Exercise per Session: 40 min  Stress: No Stress Concern Present  . Feeling of Stress :  Not at all  Social Connections: Moderately Integrated  . Frequency of Communication with Friends and Family: More than three times a week  . Frequency of Social Gatherings with Friends and Family: Once a week  . Attends Religious Services: More than 4 times per year  . Active Member of Clubs or Organizations: No  . Attends Archivist Meetings: Never  . Marital Status: Married    Tobacco Counseling Counseling given: Not Answered Comment: smoked [place], up to 1ppd   Clinical Intake:  Pre-visit preparation completed: Yes  Pain : No/denies pain     Nutritional Status: BMI 25 -29 Overweight Nutritional Risks: Unintentional weight loss (due to thyroid issues-Patient states PCP aware) Diabetes: No  How often do you need to have someone help you when you read instructions, pamphlets, or other written materials from your doctor or pharmacy?: 1 - Never  Diabetic?No  Interpreter Needed?: No  Information entered by :: Caroleen Hamman LPN   Activities of Daily Living In your present state of health, do you have any difficulty performing the following activities: 09/20/2020 06/20/2020  Hearing? Y -  Comment hearing aids -  Vision? N -  Difficulty concentrating or making decisions? Y -  Comment occasionally -  Walking or climbing stairs? N -  Dressing or bathing? N -  Doing errands, shopping? N N  Preparing Food and eating ? N -  Using the Toilet? N -  In the past six months, have you accidently leaked urine? N -  Do you have problems with loss of bowel control? Y -  Comment sees GI -  Managing your Medications? N -  Managing your Finances? N -  Housekeeping or managing your Housekeeping? N -  Some recent data might be hidden    Patient Care Team: Ma Hillock, DO as PCP - General  (Family Medicine) [place], [facility] Leandrew Koyanagi, MD as Attending Physician (Orthopedic Surgery) Jonnie Finner, RN as Oncology Nurse Navigator Ladell Pier, MD as Consulting Physician (Oncology) Michael Boston, MD as Consulting Physician (General Surgery) Richardo Priest, MD as Consulting Physician (Cardiology) Armbruster, Carlota Raspberry, MD as Consulting Physician (Gastroenterology)  Indicate any recent Medical Services you may have received from other than [facility] providers in the past year (date may be approximate).     Assessment:   This is a routine wellness examination for [place].  Hearing/Vision screen  Hearing Screening   125Hz  250Hz  500Hz  1000Hz  2000Hz  3000Hz  4000Hz  6000Hz  8000Hz   Right ear:           Left ear:           Comments: Bilateral hearing aids  Vision Screening Comments: Wears reading glasses Last eye exam-2020  Dietary issues and exercise activities discussed: Current Exercise Habits: Home exercise routine, Type of exercise: strength training/weights;Other - see comments (cardio), Time (Minutes): 45, Frequency (Times/Week): 3, Weekly Exercise (Minutes/Week): 135, Intensity: Mild, Exercise limited by: None identified  Goals    . Patient Stated     Be more active & eat healthier      Depression Screen PHQ 2/9 Scores 09/20/2020 04/20/2018 06/02/2017 05/12/2017 08/24/2015 08/24/2015 02/22/2013  PHQ - 2 Score 0 0 0 0 4 4 0  PHQ- 9 Score - 1 0 - 14 11 -    Fall Risk Fall Risk  09/20/2020 02/04/2020 04/20/2018 06/02/2017 06/02/2017  Falls in the past year? 1 1 No No No  Number falls in past yr: 0 0 - - -  Injury with Fall? 0 0 - - -  Risk for fall due to : History of fall(s) - - - -  Follow up Falls prevention discussed - - - -    FALL RISK PREVENTION PERTAINING TO THE HOME:  Any stairs in or around the home? Yes  If so, are there any without handrails? No  Home free of loose throw rugs in walkways, pet beds, electrical cords, etc? Yes  Adequate  lighting in your home to reduce risk of falls? Yes   ASSISTIVE DEVICES UTILIZED TO PREVENT FALLS:  Life alert? No  Use of a cane, walker or w/c? No  Grab bars in the bathroom? No  Shower chair or bench in shower? No  Elevated toilet seat or a handicapped toilet? No   TIMED UP AND GO:  Was the test performed? No . Phone visit   Cognitive Function:Normal cognitive status assessed by  this Nurse Health Advisor. No abnormalities found.   MMSE - Mini Mental State Exam 06/02/2017  Orientation to time 5  Orientation to Place 5  Registration 3  Attention/ Calculation 5  Recall 3  Language- name 2 objects 2  Language- repeat 1  Language- follow 3 step command 3  Language- read & follow direction 1  Write a sentence 1  Copy design 1  Total score 30     6CIT Screen 09/20/2020  What Year? 0 points  What month? 0 points  What time? 0 points  Count back from 20 0 points  Months in reverse 0 points  Repeat phrase 0 points  Total Score 0    Immunizations Immunization History  Administered Date(s) Administered  . PFIZER(Purple Top)SARS-COV-2 Vaccination 09/20/2019, 10/20/2019, 07/03/2020  . PPD Test 03/17/2013  . Tdap 07/15/2012  . Zoster 02/06/2015    TDAP status: Up to date  Flu Vaccine status: Declined, Education has been provided regarding the importance of this vaccine but patient still declined. Advised may receive this vaccine at local pharmacy or Health Dept. Aware to provide a copy of the vaccination record if obtained from local pharmacy or Health Dept. Verbalized acceptance and understanding.  Pneumococcal vaccine status: Due, Education has been provided regarding the importance of this vaccine. Advised may receive this vaccine at local pharmacy or Health Dept. Aware to provide a copy of the vaccination record if obtained from local pharmacy or Health Dept. Verbalized acceptance and understanding.  Covid-19 vaccine status: Completed vaccines  Qualifies for Shingles  Vaccine? Yes   Zostavax completed Yes   Shingrix Completed?: No.    Education has been provided regarding the importance of this vaccine. Patient has been advised to call insurance company to determine out of pocket expense if they have not yet received this vaccine. Advised may also receive vaccine at local pharmacy or Health Dept. Verbalized acceptance and understanding.  Screening Tests Health Maintenance  Topic Date Due  . Hepatitis C Screening  Never done  . PNA vac Low Risk Adult (1 of 2 - PCV13) Never done  . COVID-19 Vaccine (4 - Booster for Pfizer series) 01/01/2021  . TETANUS/TDAP  07/15/2022  . COLONOSCOPY (Pts 45-94yrs Insurance coverage will need to be confirmed)  05/24/2030  . HPV VACCINES  Aged Out  . INFLUENZA VACCINE  Discontinued    Health Maintenance  Health Maintenance Due  Topic Date Due  . Hepatitis C Screening  Never done  . PNA vac Low Risk Adult (1 of 2 - PCV13) Never done    Colorectal cancer screening: Colonoscopy  05/24/2020-Followed by GI for colon cancer.  Lung Cancer Screening: (Low Dose CT Chest recommended if Age 17-80 years, 30 pack-year currently smoking OR have quit w/in 15years.) does not qualify.     Additional Screening:  Hepatitis C Screening: does qualify; phone visit-Discuss with PCP  Vision Screening: Recommended annual ophthalmology exams for early detection of glaucoma and other disorders of the eye. Is the patient up to date with their annual eye exam?  No  Who is the provider or what is the name of the office in which the patient attends annual eye exams? Patient unsure of name If pt is not established with a provider, would they like to be referred to a provider to establish care? No .   Dental Screening: Recommended annual dental exams for proper oral hygiene  Community Resource Referral / Chronic Care Management: CRR required this visit?  No   CCM required this visit?  No      Plan:     I have personally reviewed and  noted the following in the patient's chart:   . Medical and social history . Use of alcohol, tobacco or illicit drugs  . Current medications and supplements . Functional ability and status . Nutritional status . Physical activity . Advanced directives . List of other physicians . Hospitalizations, surgeries, and ER visits in previous 12 months . Vitals . Screenings to include cognitive, depression, and falls . Referrals and appointments  In addition, I have reviewed and discussed with patient certain preventive protocols, quality metrics, and best practice recommendations. A written personalized care plan for preventive services as well as general preventive health recommendations were provided to patient.   Due to this being a telephonic visit, the after visit summary with patients personalized plan was offered to patient via mail or my-chart. Patient would like to access on my-chart.   Marta Antu, LPN   09/17/5730  Nurse Health Advisor  Nurse Notes: None

## 2020-09-20 NOTE — Patient Instructions (Signed)
Nathan [name] , Thank you for taking time to complete your Medicare Wellness Visit. I appreciate your ongoing commitment to your health goals. Please review the following plan we discussed and let me know if I can assist you in the future.   Screening recommendations/referrals: Colonoscopy: Completed-05/24/2020-Please follow recommendations from GI for follow up. Recommended yearly ophthalmology/optometry visit for glaucoma screening and checkup Recommended yearly dental visit for hygiene and checkup  Vaccinations: Influenza vaccine: Declined Pneumococcal vaccine: Due-Discuss with PCP Tdap vaccine: Up to date-Due-07/15/2022 Shingles vaccine: Discuss with pharmacy  Covid-19: Completed vaccines  Advanced directives: Copy in chart  Conditions/risks identified: See problem list  Next appointment: Follow up in one year for your annual wellness visit.   Preventive Care 35 Years and Older, Male Preventive care refers to lifestyle choices and visits with your health care provider that can promote health and wellness. What does preventive care include?  A yearly physical exam. This is also called an annual well check.  Dental exams once or twice a year.  Routine eye exams. Ask your health care provider how often you should have your eyes checked.  Personal lifestyle choices, including:  Daily care of your teeth and gums.  Regular physical activity.  Eating a healthy diet.  Avoiding tobacco and drug use.  Limiting alcohol use.  Practicing safe sex.  Taking low doses of aspirin every day.  Taking vitamin and mineral supplements as recommended by your health care provider. What happens during an annual well check? The services and screenings done by your health care provider during your annual well check will depend on your age, overall health, lifestyle risk factors, and family history of disease. Counseling  Your health care provider may ask you questions about your:  Alcohol  use.  Tobacco use.  Drug use.  Emotional well-being.  Home and relationship well-being.  Sexual activity.  Eating habits.  History of falls.  Memory and ability to understand (cognition).  Work and work Statistician. Screening  You may have the following tests or measurements:  Height, weight, and BMI.  Blood pressure.  Lipid and cholesterol levels. These may be checked every 5 years, or more frequently if you are over 4 years old.  Skin check.  Lung cancer screening. You may have this screening every year starting at age 72 if you have a 30-pack-year history of smoking and currently smoke or have quit within the past 15 years.  Fecal occult blood test (FOBT) of the stool. You may have this test every year starting at age 55.  Flexible sigmoidoscopy or colonoscopy. You may have a sigmoidoscopy every 5 years or a colonoscopy every 10 years starting at age 46.  Prostate cancer screening. Recommendations will vary depending on your family history and other risks.  Hepatitis C blood test.  Hepatitis B blood test.  Sexually transmitted disease (STD) testing.  Diabetes screening. This is done by checking your blood sugar (glucose) after you have not eaten for a while (fasting). You may have this done every 1-3 years.  Abdominal aortic aneurysm (AAA) screening. You may need this if you are a current or former smoker.  Osteoporosis. You may be screened starting at age 35 if you are at high risk. Talk with your health care provider about your test results, treatment options, and if necessary, the need for more tests. Vaccines  Your health care provider may recommend certain vaccines, such as:  Influenza vaccine. This is recommended every year.  Tetanus, diphtheria, and acellular pertussis (Tdap, Td)  vaccine. You may need a Td booster every 10 years.  Zoster vaccine. You may need this after age 57.  Pneumococcal 13-valent conjugate (PCV13) vaccine. One dose is  recommended after age 61.  Pneumococcal polysaccharide (PPSV23) vaccine. One dose is recommended after age 8. Talk to your health care provider about which screenings and vaccines you need and how often you need them. This information is not intended to replace advice given to you by your health care provider. Make sure you discuss any questions you have with your health care provider. Document Released: 07/28/2015 Document Revised: 03/20/2016 Document Reviewed: 05/02/2015 Elsevier Interactive Patient Education  2017 [employer] Prevention in the Home Falls can cause injuries. They can happen to people of all ages. There are many things you can do to make your home safe and to help prevent falls. What can I do on the outside of my home?  Regularly fix the edges of walkways and driveways and fix any cracks.  Remove anything that might make you trip as you walk through a door, such as a raised step or threshold.  Trim any bushes or trees on the path to your home.  Use bright outdoor lighting.  Clear any walking paths of anything that might make someone trip, such as rocks or tools.  Regularly check to see if handrails are loose or broken. Make sure that both sides of any steps have handrails.  Any raised decks and porches should have guardrails on the edges.  Have any leaves, snow, or ice cleared regularly.  Use sand or salt on walking paths during winter.  Clean up any spills in your garage right away. This includes oil or grease spills. What can I do in the bathroom?  Use night lights.  Install grab bars by the toilet and in the tub and shower. Do not use towel bars as grab bars.  Use non-skid mats or decals in the tub or shower.  If you need to sit down in the shower, use a plastic, non-slip stool.  Keep the floor dry. Clean up any water that spills on the floor as soon as it happens.  Remove soap buildup in the tub or shower regularly.  Attach bath mats  securely with double-sided non-slip rug tape.  Do not have throw rugs and other things on the floor that can make you trip. What can I do in the bedroom?  Use night lights.  Make sure that you have a light by your bed that is easy to reach.  Do not use any sheets or blankets that are too big for your bed. They should not hang down onto the floor.  Have a firm chair that has side arms. You can use this for support while you get dressed.  Do not have throw rugs and other things on the floor that can make you trip. What can I do in the kitchen?  Clean up any spills right away.  Avoid walking on wet floors.  Keep items that you use a lot in easy-to-reach places.  If you need to reach something above you, use a strong step stool that has a grab bar.  Keep electrical cords out of the way.  Do not use floor polish or wax that makes floors slippery. If you must use wax, use non-skid floor wax.  Do not have throw rugs and other things on the floor that can make you trip. What can I do with my stairs?  Do not leave  any items on the stairs.  Make sure that there are handrails on both sides of the stairs and use them. Fix handrails that are broken or loose. Make sure that handrails are as long as the stairways.  Check any carpeting to make sure that it is firmly attached to the stairs. Fix any carpet that is loose or worn.  Avoid having throw rugs at the top or bottom of the stairs. If you do have throw rugs, attach them to the floor with carpet tape.  Make sure that you have a light switch at the top of the stairs and the bottom of the stairs. If you do not have them, ask someone to add them for you. What else can I do to help prevent falls?  Wear shoes that:  Do not have high heels.  Have rubber bottoms.  Are comfortable and fit you well.  Are closed at the toe. Do not wear sandals.  If you use a stepladder:  Make sure that it is fully opened. Do not climb a closed  stepladder.  Make sure that both sides of the stepladder are locked into place.  Ask someone to hold it for you, if possible.  Clearly mark and make sure that you can see:  Any grab bars or handrails.  First and last steps.  Where the edge of each step is.  Use tools that help you move around (mobility aids) if they are needed. These include:  Canes.  Walkers.  Scooters.  Crutches.  Turn on the lights when you go into a dark area. Replace any light bulbs as soon as they burn out.  Set up your furniture so you have a clear path. Avoid moving your furniture around.  If any of your floors are uneven, fix them.  If there are any pets around you, be aware of where they are.  Review your medicines with your doctor. Some medicines can make you feel dizzy. This can increase your chance of falling. Ask your doctor what other things that you can do to help prevent falls. This information is not intended to replace advice given to you by your health care provider. Make sure you discuss any questions you have with your health care provider. Document Released: 04/27/2009 Document Revised: 12/07/2015 Document Reviewed: 08/05/2014 Elsevier Interactive Patient Education  2017 [employer].

## 2020-11-16 IMAGING — CT CT ABDOMEN AND PELVIS WITH CONTRAST
2 of 5 series · 16 of 46 positions shown, 18 images · IV contrast (APPLIED)
Comparison: 05/21/2017

CLINICAL DATA: Abdominal pain with vomiting beginning today.

EXAM:
CT ABDOMEN AND PELVIS WITH CONTRAST
TECHNIQUE: Multidetector CT imaging of the abdomen and pelvis was performed
using the standard protocol following bolus administration of
intravenous contrast.
CONTRAST:  100mL OMNIPAQUE IOHEXOL 300 MG/ML  SOLN

[Series 3: abdomen 5.0 · axial · 0.82mm/px · z∈[+641,+1061]mm · 13 of 98 slices shown, 15 images]
[im 7/98  soft-tissue]
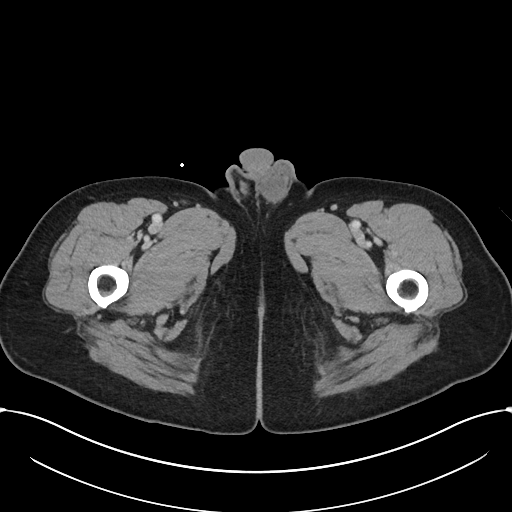
[im 7/98  bone]
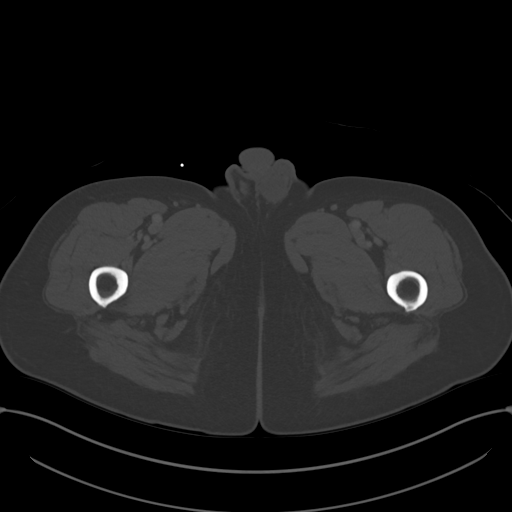
[im 13/98  soft-tissue]
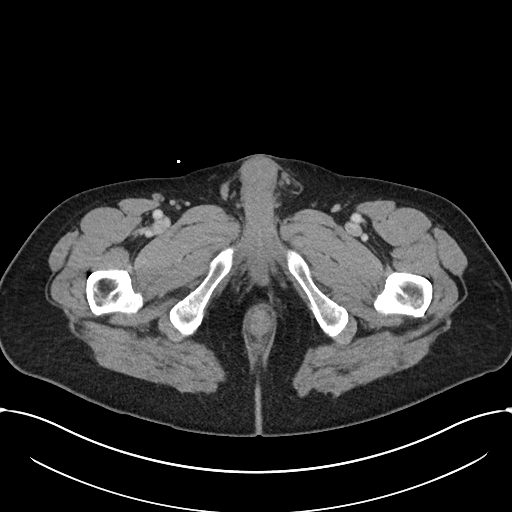
[im 19/98  soft-tissue]
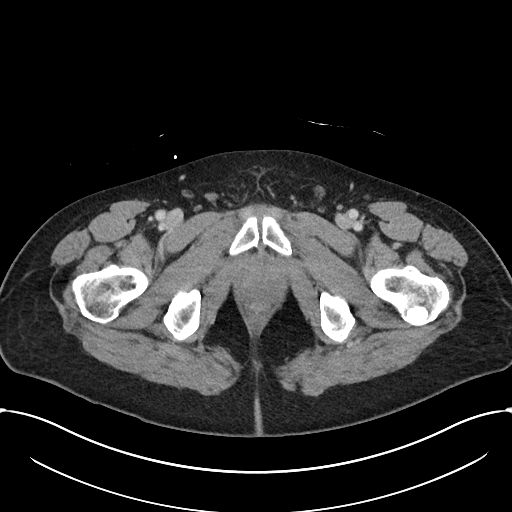
[im 31/98  soft-tissue]
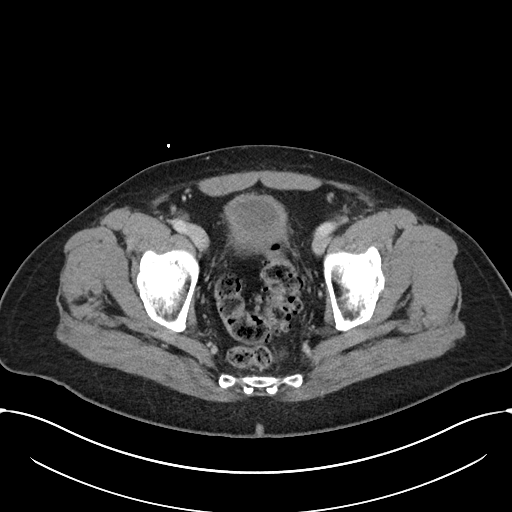
[im 37/98  soft-tissue]
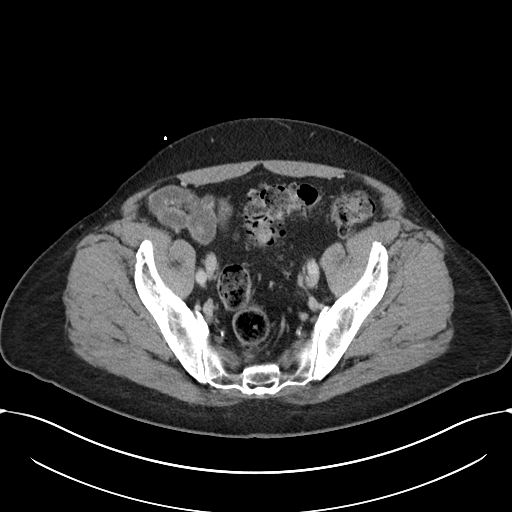
[im 43/98  soft-tissue]
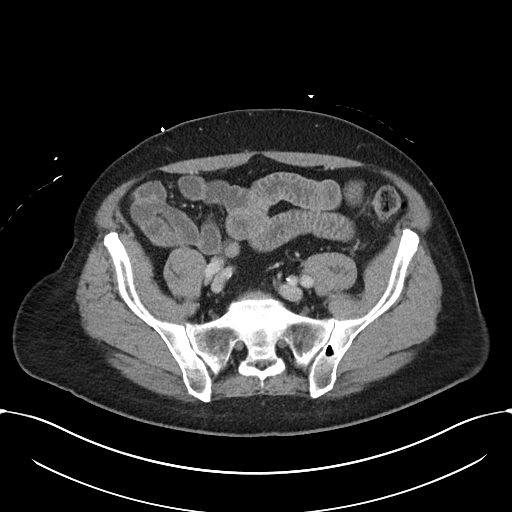
[im 49/98  soft-tissue]
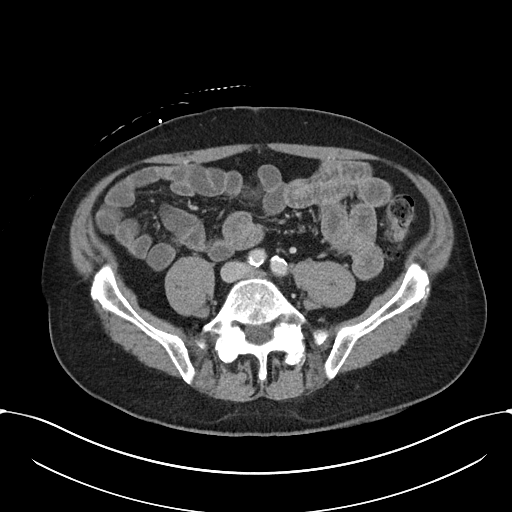
[im 55/98  soft-tissue]
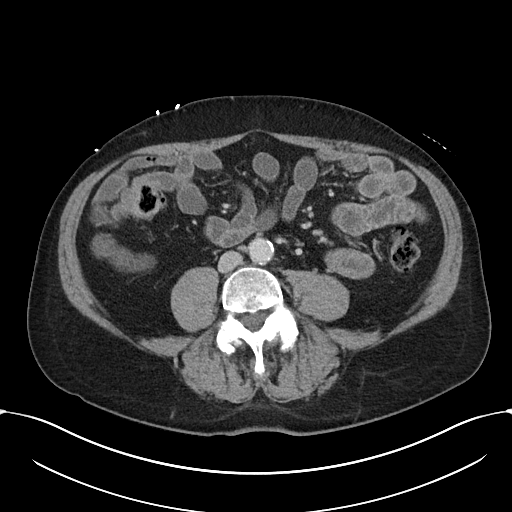
[im 61/98  soft-tissue]
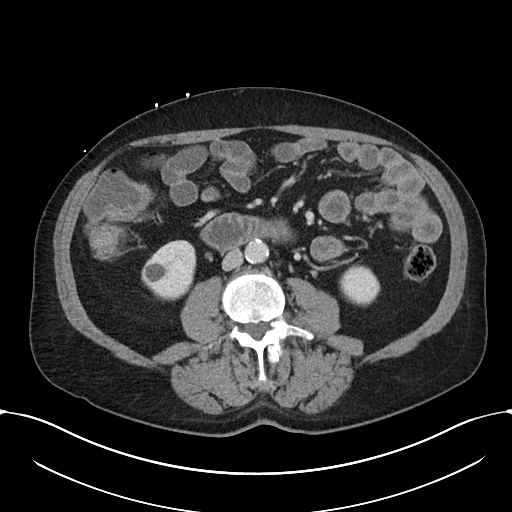
[im 61/98  bone]
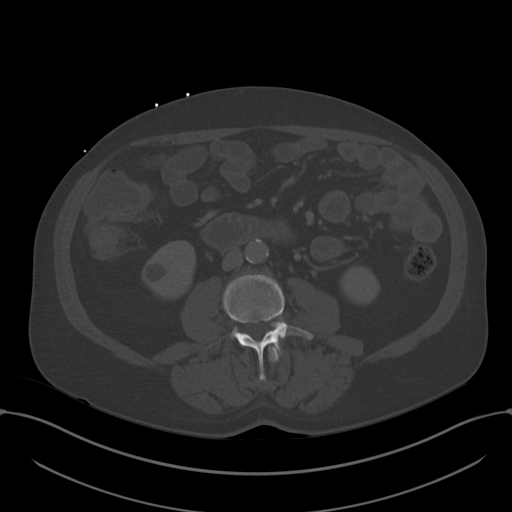
[im 67/98  soft-tissue]
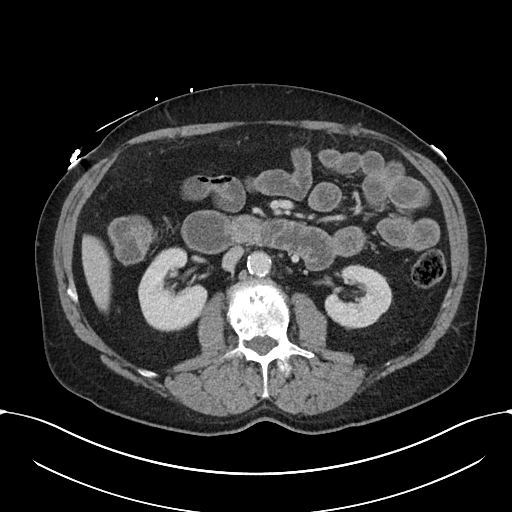
[im 79/98  soft-tissue]
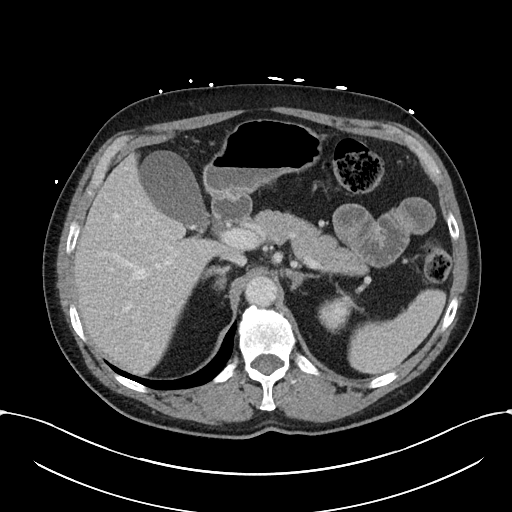
[im 85/98  soft-tissue]
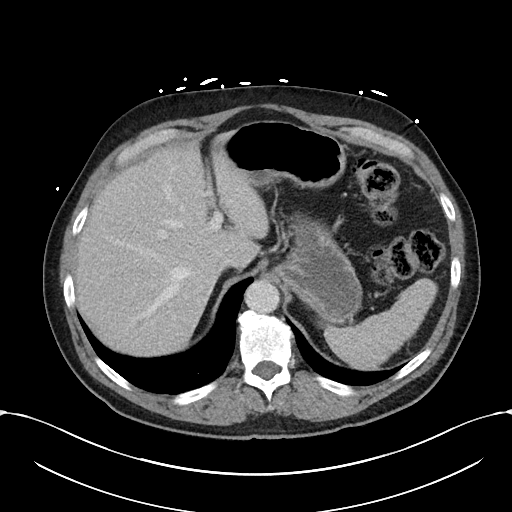
[im 91/98  soft-tissue]
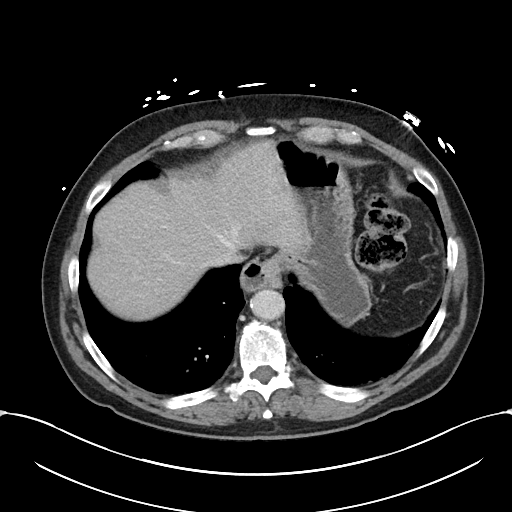

[Series 6: abdomen 3.0 mpr cor · coronal · 0.79mm/px · 3 of 101 slices shown]
[im 34/101  soft-tissue]
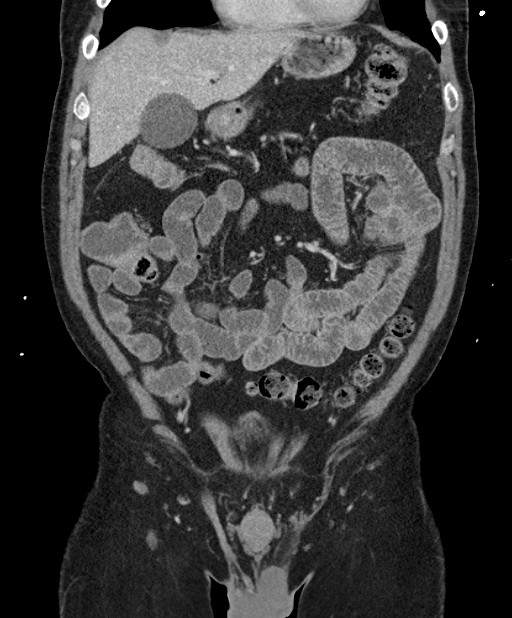
[im 45/101  soft-tissue]
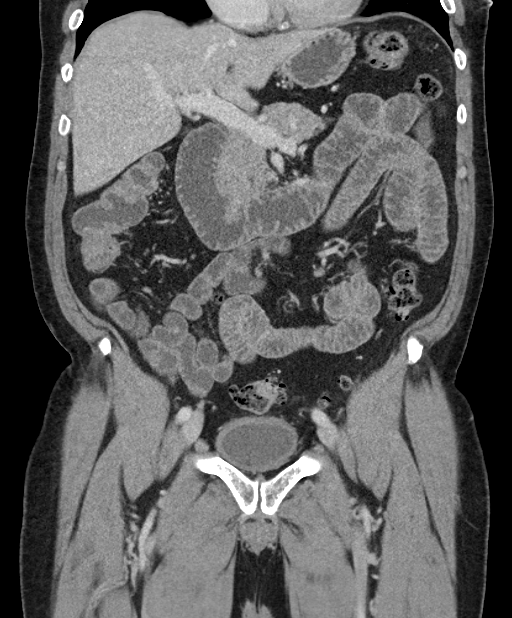
[im 56/101  soft-tissue]
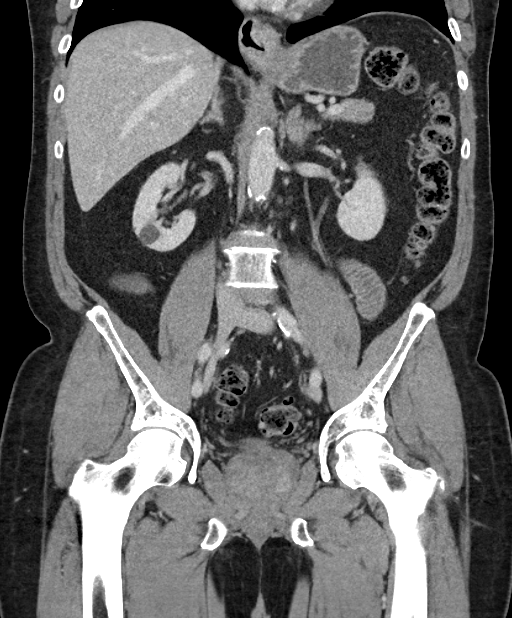

[16 of 46 positions shown; findings below may reference images not displayed]

FINDINGS: Lower chest: No acute abnormality.

Hepatobiliary: Normal liver. Small density in the gallbladder neck
consistent with a stone, stable from the prior CT. No gallbladder
wall thickening or inflammation. No bile duct dilation.

Pancreas: Unremarkable. No pancreatic ductal dilatation or
surrounding inflammatory changes.

Spleen: Normal in size without focal abnormality.

Adrenals/Urinary Tract: Stable 14 mm right adrenal adenoma. Normal
left adrenal gland.

Kidneys normal in size, orientation and position. Symmetric renal
enhancement and excretion. 16 mm cyst, lower pole the right kidney.
Two subcentimeter low-density left renal lesions are noted also
likely cysts. No other renal masses, no stones and no
hydronephrosis. Normal ureters. Bladder is unremarkable.

Stomach/Bowel: Small hiatal hernia. Stomach otherwise unremarkable.
Small bowel is mildly prominent and fluid filled. No wall
thickening. No inflammation. Colon is normal in caliber. There are
numerous left colon diverticula mostly along the sigmoid. No
diverticulitis. No colon wall thickening or other inflammatory
process. Normal appendix visualized.

Vascular/Lymphatic: Aortic atherosclerosis. No enlarged abdominal or
pelvic lymph nodes.

Reproductive: Enlarged prostate measuring 5.9 x 4.5 x 4.8 cm.

Other: No abdominal wall hernia or abnormality. No abdominopelvic
ascites.

Musculoskeletal: No fracture or acute finding. No osteoblastic or
osteolytic lesions.
IMPRESSION: 1. Mildly prominent fluid-filled small bowel with no wall thickening
or inflammation. No evidence of bowel obstruction. Findings
consistent with gastroenteritis.
2. No other evidence of an acute abnormality.
3. Stable right adrenal adenoma.
4. Left colon diverticulosis.  No diverticulitis.
5. Aortic atherosclerosis.
6. Prostatic hypertrophy.

## 2021-02-16 DIAGNOSIS — M9902 Segmental and somatic dysfunction of thoracic region: Secondary | ICD-10-CM | POA: Diagnosis not present

## 2021-02-16 DIAGNOSIS — M9903 Segmental and somatic dysfunction of lumbar region: Secondary | ICD-10-CM | POA: Diagnosis not present

## 2021-02-16 DIAGNOSIS — M9901 Segmental and somatic dysfunction of cervical region: Secondary | ICD-10-CM | POA: Diagnosis not present

## 2021-02-16 DIAGNOSIS — M542 Cervicalgia: Secondary | ICD-10-CM | POA: Diagnosis not present

## 2021-02-16 DIAGNOSIS — M5459 Other low back pain: Secondary | ICD-10-CM | POA: Diagnosis not present

## 2021-02-16 DIAGNOSIS — M9904 Segmental and somatic dysfunction of sacral region: Secondary | ICD-10-CM | POA: Diagnosis not present

## 2021-02-16 DIAGNOSIS — S39013A Strain of muscle, fascia and tendon of pelvis, initial encounter: Secondary | ICD-10-CM | POA: Diagnosis not present

## 2021-02-19 DIAGNOSIS — M542 Cervicalgia: Secondary | ICD-10-CM | POA: Diagnosis not present

## 2021-02-19 DIAGNOSIS — M9901 Segmental and somatic dysfunction of cervical region: Secondary | ICD-10-CM | POA: Diagnosis not present

## 2021-02-19 DIAGNOSIS — M5459 Other low back pain: Secondary | ICD-10-CM | POA: Diagnosis not present

## 2021-02-19 DIAGNOSIS — M9903 Segmental and somatic dysfunction of lumbar region: Secondary | ICD-10-CM | POA: Diagnosis not present

## 2021-02-19 DIAGNOSIS — M9902 Segmental and somatic dysfunction of thoracic region: Secondary | ICD-10-CM | POA: Diagnosis not present

## 2021-02-19 DIAGNOSIS — S39013A Strain of muscle, fascia and tendon of pelvis, initial encounter: Secondary | ICD-10-CM | POA: Diagnosis not present

## 2021-02-19 DIAGNOSIS — M9904 Segmental and somatic dysfunction of sacral region: Secondary | ICD-10-CM | POA: Diagnosis not present

## 2021-02-23 DIAGNOSIS — M9901 Segmental and somatic dysfunction of cervical region: Secondary | ICD-10-CM | POA: Diagnosis not present

## 2021-02-23 DIAGNOSIS — M9903 Segmental and somatic dysfunction of lumbar region: Secondary | ICD-10-CM | POA: Diagnosis not present

## 2021-02-23 DIAGNOSIS — S39013A Strain of muscle, fascia and tendon of pelvis, initial encounter: Secondary | ICD-10-CM | POA: Diagnosis not present

## 2021-02-23 DIAGNOSIS — M9904 Segmental and somatic dysfunction of sacral region: Secondary | ICD-10-CM | POA: Diagnosis not present

## 2021-02-23 DIAGNOSIS — M542 Cervicalgia: Secondary | ICD-10-CM | POA: Diagnosis not present

## 2021-02-23 DIAGNOSIS — M5459 Other low back pain: Secondary | ICD-10-CM | POA: Diagnosis not present

## 2021-02-23 DIAGNOSIS — M9902 Segmental and somatic dysfunction of thoracic region: Secondary | ICD-10-CM | POA: Diagnosis not present

## 2021-03-02 DIAGNOSIS — S39013A Strain of muscle, fascia and tendon of pelvis, initial encounter: Secondary | ICD-10-CM | POA: Diagnosis not present

## 2021-03-02 DIAGNOSIS — M5459 Other low back pain: Secondary | ICD-10-CM | POA: Diagnosis not present

## 2021-03-02 DIAGNOSIS — M9901 Segmental and somatic dysfunction of cervical region: Secondary | ICD-10-CM | POA: Diagnosis not present

## 2021-03-02 DIAGNOSIS — M9904 Segmental and somatic dysfunction of sacral region: Secondary | ICD-10-CM | POA: Diagnosis not present

## 2021-03-02 DIAGNOSIS — M9903 Segmental and somatic dysfunction of lumbar region: Secondary | ICD-10-CM | POA: Diagnosis not present

## 2021-03-02 DIAGNOSIS — M9902 Segmental and somatic dysfunction of thoracic region: Secondary | ICD-10-CM | POA: Diagnosis not present

## 2021-03-02 DIAGNOSIS — M542 Cervicalgia: Secondary | ICD-10-CM | POA: Diagnosis not present

## 2021-03-09 DIAGNOSIS — S39013A Strain of muscle, fascia and tendon of pelvis, initial encounter: Secondary | ICD-10-CM | POA: Diagnosis not present

## 2021-03-09 DIAGNOSIS — M9904 Segmental and somatic dysfunction of sacral region: Secondary | ICD-10-CM | POA: Diagnosis not present

## 2021-03-09 DIAGNOSIS — M5459 Other low back pain: Secondary | ICD-10-CM | POA: Diagnosis not present

## 2021-03-09 DIAGNOSIS — M9903 Segmental and somatic dysfunction of lumbar region: Secondary | ICD-10-CM | POA: Diagnosis not present

## 2021-03-09 DIAGNOSIS — M542 Cervicalgia: Secondary | ICD-10-CM | POA: Diagnosis not present

## 2021-03-09 DIAGNOSIS — M9902 Segmental and somatic dysfunction of thoracic region: Secondary | ICD-10-CM | POA: Diagnosis not present

## 2021-03-09 DIAGNOSIS — M9901 Segmental and somatic dysfunction of cervical region: Secondary | ICD-10-CM | POA: Diagnosis not present

## 2021-03-14 IMAGING — US BILATERAL CAROTID DUPLEX ULTRASOUND
1 series · 13 of 24 positions shown · non-contrast
Comparison: None.

CLINICAL DATA: 73-year-old male with dizziness and hyperlipidemia

EXAM:
BILATERAL CAROTID DUPLEX ULTRASOUND
TECHNIQUE: Gray scale imaging, color Doppler and duplex ultrasound were
performed of bilateral carotid and vertebral arteries in the neck.

[Series 1: bilateral carotid duplex ultrasound · 13 of 76 slices shown]
[im 1/76]
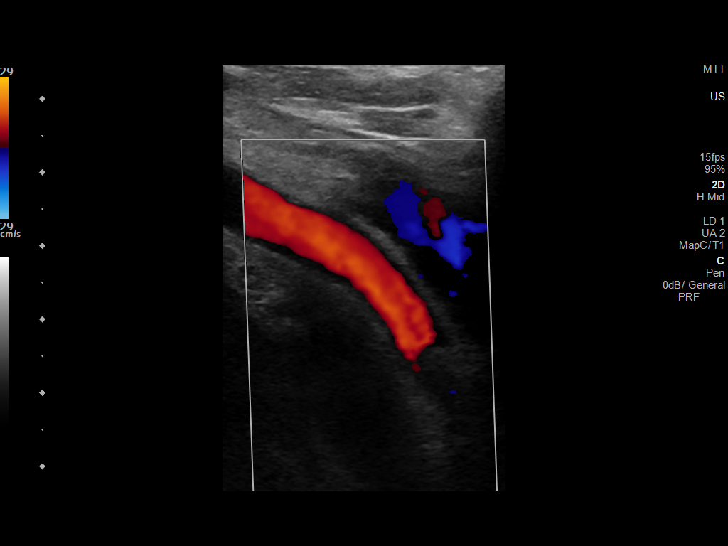
[im 7/76]
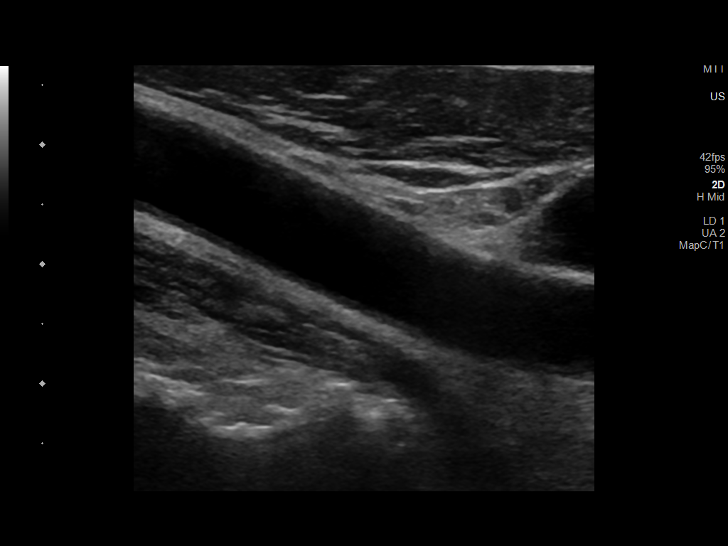
[im 14/76]
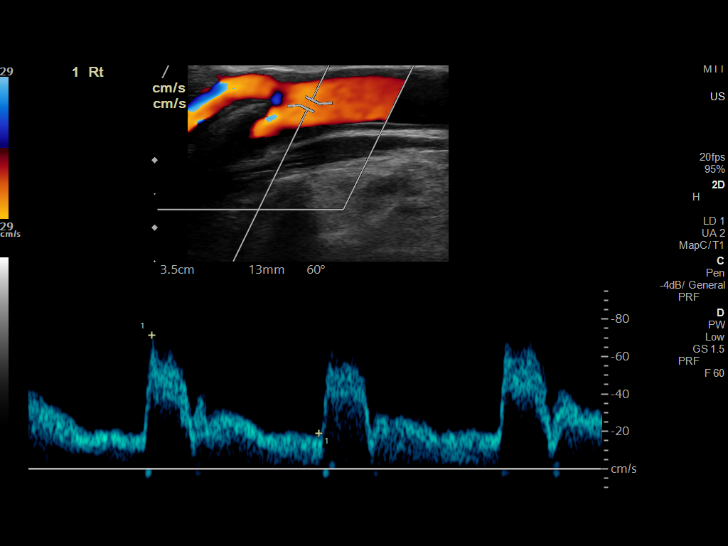
[im 20/76]
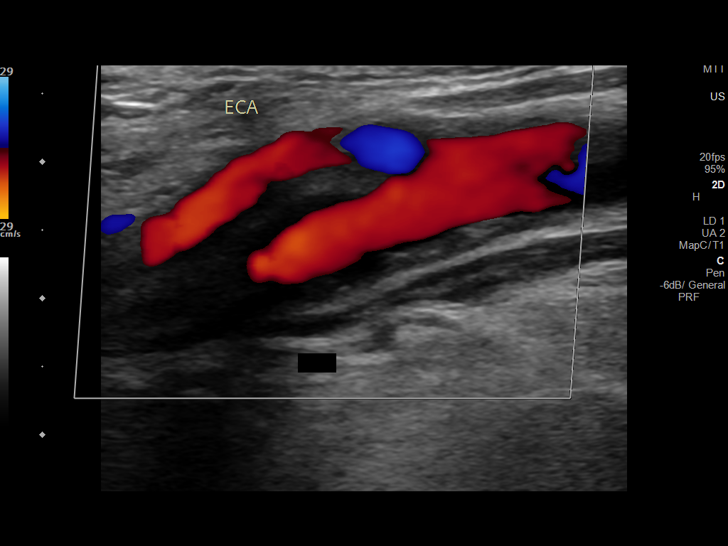
[im 27/76]
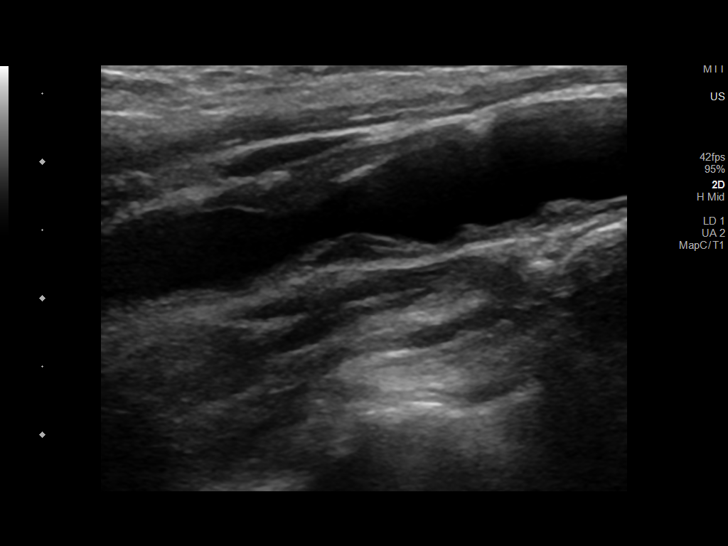
[im 33/76]
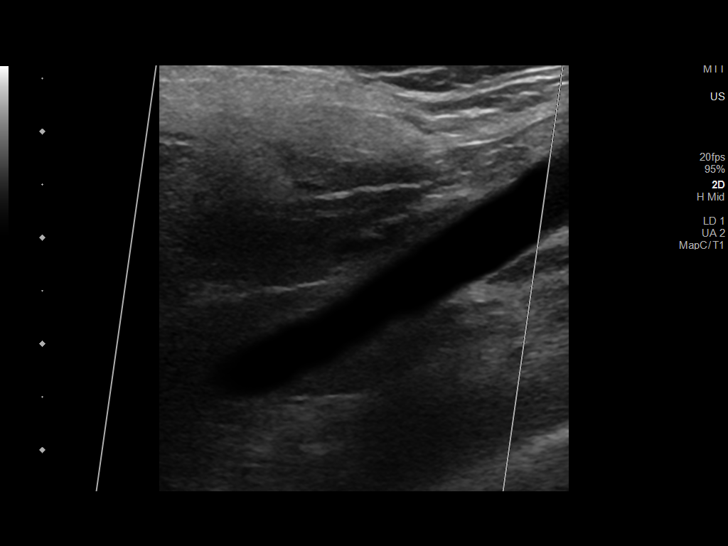
[im 40/76]
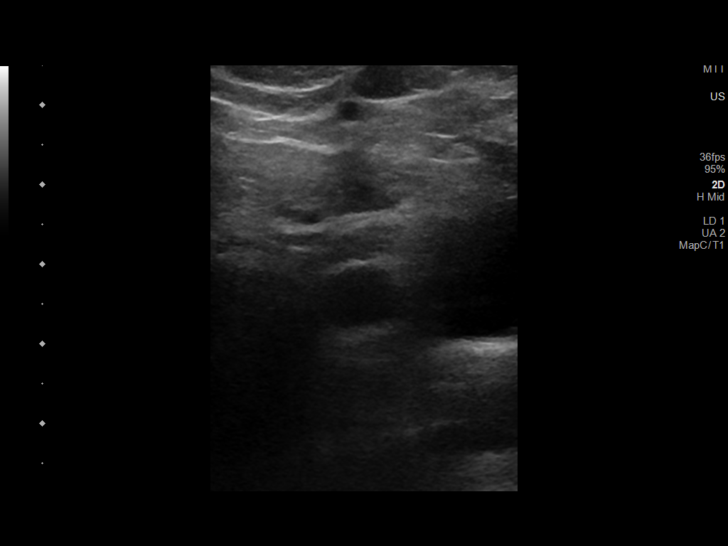
[im 43/76]
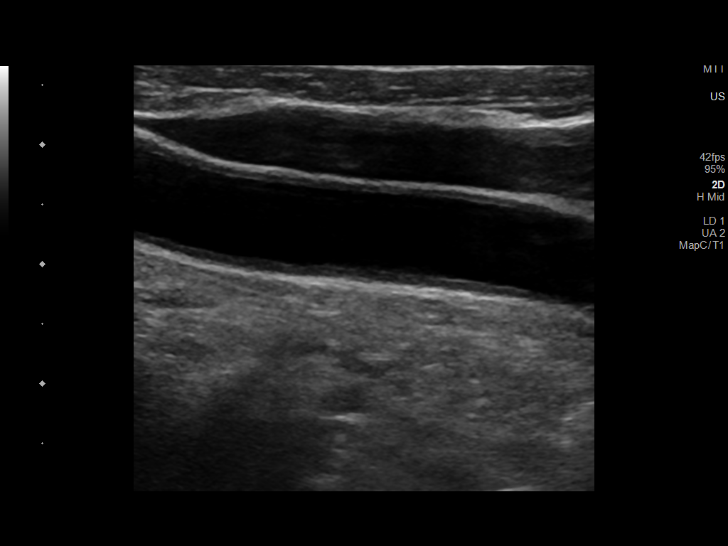
[im 49/76]
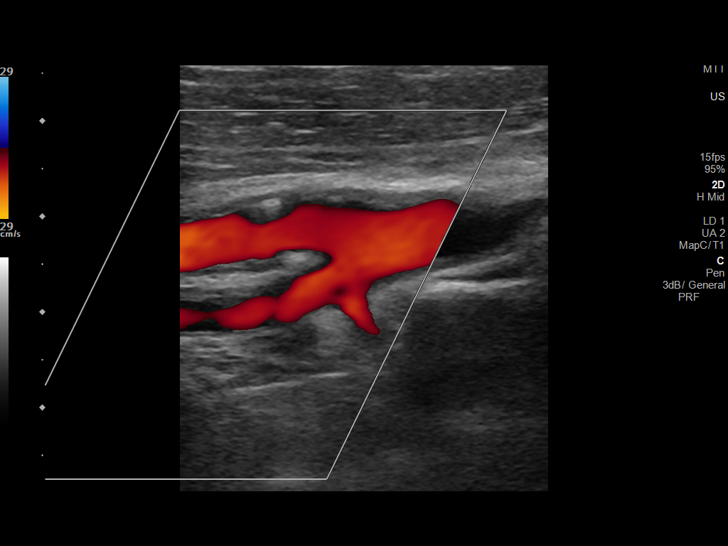
[im 56/76]
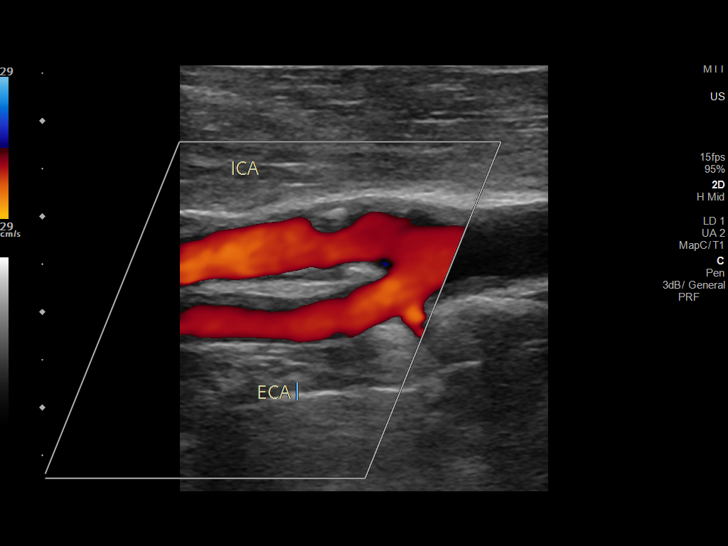
[im 62/76]
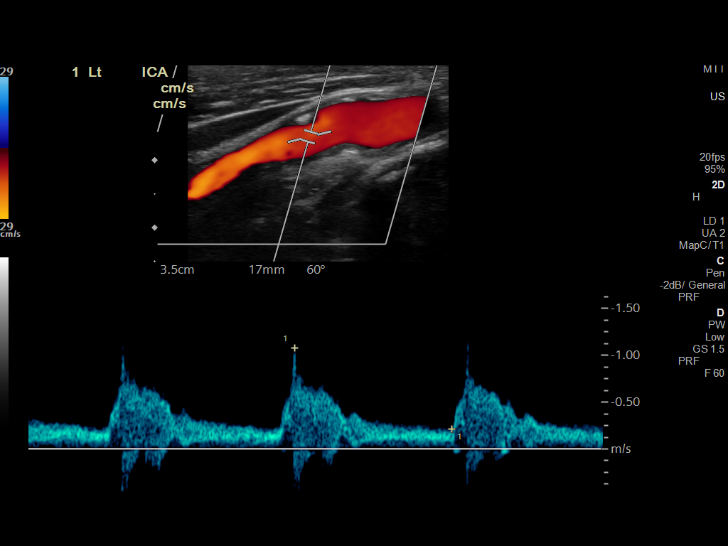
[im 69/76]
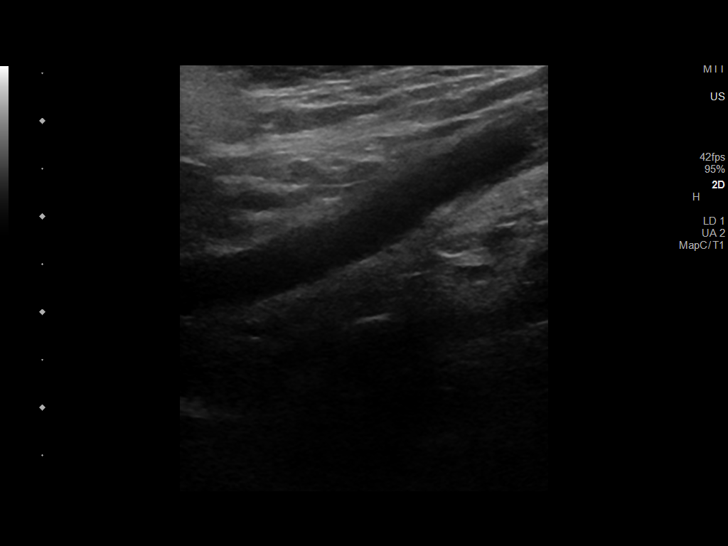
[im 76/76]
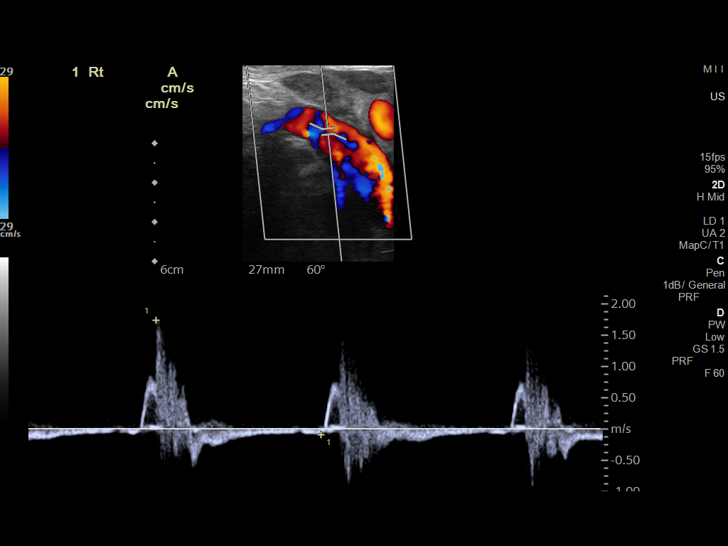

[13 of 24 positions shown; findings below may reference images not displayed]

FINDINGS: Criteria: Quantification of carotid stenosis is based on velocity
parameters that correlate the residual internal carotid diameter
with NASCET-based stenosis levels, using the diameter of the distal
internal carotid lumen as the denominator for stenosis measurement.

The following velocity measurements were obtained:

RIGHT
ICA: 160/49 cm/sec
CCA: 114/21 cm/sec

SYSTOLIC ICA/CCA RATIO:

ECA:  171 cm/sec

LEFT

ICA: 108/21 cm/sec

CCA: 84/25 cm/sec

SYSTOLIC ICA/CCA RATIO:

ECA:  150 cm/sec

RIGHT CAROTID ARTERY: Heterogeneous and mildly irregular
atherosclerotic plaque in the proximal internal carotid artery. By
peak systolic velocity criteria, the estimated stenosis falls in the
50-69% diameter range.

RIGHT VERTEBRAL ARTERY:  Patent with normal antegrade flow.

LEFT CAROTID ARTERY: Heterogeneous and slightly irregular
atherosclerotic plaque in the proximal internal carotid artery. By
peak systolic velocity criteria, the estimated stenosis is less than
50%.

LEFT VERTEBRAL ARTERY:  Retrograde flow in the vertebral artery.
IMPRESSION: 1. Moderate (50-69%) stenosis proximal right internal carotid artery
secondary to heterogeneous and mildly irregular atherosclerotic
plaque.
2. Mild (1-49%) stenosis proximal left internal carotid artery
secondary to heterogeneous and slightly irregular atherosclerotic
plaque.
3. Flow reversal in the left vertebral artery consistent with
subclavian steal in the setting of a hemodynamically significant
stenosis or occlusion of the proximal subclavian artery.
4. Normal antegrade flow in the right vertebral artery.

## 2021-03-16 DIAGNOSIS — M9901 Segmental and somatic dysfunction of cervical region: Secondary | ICD-10-CM | POA: Diagnosis not present

## 2021-03-16 DIAGNOSIS — M9903 Segmental and somatic dysfunction of lumbar region: Secondary | ICD-10-CM | POA: Diagnosis not present

## 2021-03-16 DIAGNOSIS — M9902 Segmental and somatic dysfunction of thoracic region: Secondary | ICD-10-CM | POA: Diagnosis not present

## 2021-03-16 DIAGNOSIS — M542 Cervicalgia: Secondary | ICD-10-CM | POA: Diagnosis not present

## 2021-03-16 DIAGNOSIS — M5459 Other low back pain: Secondary | ICD-10-CM | POA: Diagnosis not present

## 2021-03-16 DIAGNOSIS — M9904 Segmental and somatic dysfunction of sacral region: Secondary | ICD-10-CM | POA: Diagnosis not present

## 2021-03-16 DIAGNOSIS — S39013A Strain of muscle, fascia and tendon of pelvis, initial encounter: Secondary | ICD-10-CM | POA: Diagnosis not present

## 2021-03-23 DIAGNOSIS — S39013A Strain of muscle, fascia and tendon of pelvis, initial encounter: Secondary | ICD-10-CM | POA: Diagnosis not present

## 2021-03-23 DIAGNOSIS — M9904 Segmental and somatic dysfunction of sacral region: Secondary | ICD-10-CM | POA: Diagnosis not present

## 2021-03-23 DIAGNOSIS — M542 Cervicalgia: Secondary | ICD-10-CM | POA: Diagnosis not present

## 2021-03-23 DIAGNOSIS — M9902 Segmental and somatic dysfunction of thoracic region: Secondary | ICD-10-CM | POA: Diagnosis not present

## 2021-03-23 DIAGNOSIS — M5459 Other low back pain: Secondary | ICD-10-CM | POA: Diagnosis not present

## 2021-03-23 DIAGNOSIS — M9901 Segmental and somatic dysfunction of cervical region: Secondary | ICD-10-CM | POA: Diagnosis not present

## 2021-03-23 DIAGNOSIS — M9903 Segmental and somatic dysfunction of lumbar region: Secondary | ICD-10-CM | POA: Diagnosis not present

## 2021-04-12 ENCOUNTER — Ambulatory Visit (INDEPENDENT_AMBULATORY_CARE_PROVIDER_SITE_OTHER): Payer: Medicare Other | Admitting: Family Medicine

## 2021-04-12 ENCOUNTER — Encounter: Payer: Self-pay | Admitting: Family Medicine

## 2021-04-12 ENCOUNTER — Other Ambulatory Visit: Payer: Self-pay

## 2021-04-12 VITALS — BP 148/77 | HR 61 | Temp 97.6°F | Ht 68.0 in | Wt 184.0 lb

## 2021-04-12 DIAGNOSIS — C20 Malignant neoplasm of rectum: Secondary | ICD-10-CM | POA: Diagnosis not present

## 2021-04-12 DIAGNOSIS — R5383 Other fatigue: Secondary | ICD-10-CM

## 2021-04-12 DIAGNOSIS — D649 Anemia, unspecified: Secondary | ICD-10-CM | POA: Diagnosis not present

## 2021-04-12 DIAGNOSIS — I10 Essential (primary) hypertension: Secondary | ICD-10-CM | POA: Diagnosis not present

## 2021-04-12 DIAGNOSIS — I700 Atherosclerosis of aorta: Secondary | ICD-10-CM

## 2021-04-12 DIAGNOSIS — E663 Overweight: Secondary | ICD-10-CM | POA: Diagnosis not present

## 2021-04-12 DIAGNOSIS — R739 Hyperglycemia, unspecified: Secondary | ICD-10-CM | POA: Diagnosis not present

## 2021-04-12 DIAGNOSIS — Z0001 Encounter for general adult medical examination with abnormal findings: Secondary | ICD-10-CM | POA: Diagnosis not present

## 2021-04-12 DIAGNOSIS — R972 Elevated prostate specific antigen [PSA]: Secondary | ICD-10-CM | POA: Diagnosis not present

## 2021-04-12 DIAGNOSIS — Z2821 Immunization not carried out because of patient refusal: Secondary | ICD-10-CM | POA: Diagnosis not present

## 2021-04-12 DIAGNOSIS — E0590 Thyrotoxicosis, unspecified without thyrotoxic crisis or storm: Secondary | ICD-10-CM

## 2021-04-12 DIAGNOSIS — Z5320 Procedure and treatment not carried out because of patient's decision for unspecified reasons: Secondary | ICD-10-CM

## 2021-04-12 DIAGNOSIS — E782 Mixed hyperlipidemia: Secondary | ICD-10-CM

## 2021-04-12 DIAGNOSIS — Z789 Other specified health status: Secondary | ICD-10-CM

## 2021-04-12 LAB — LIPID PANEL
Cholesterol: 318 mg/dL — ABNORMAL HIGH (ref 0–200)
HDL: 62.2 mg/dL (ref >39.00)
NonHDL: 255.39
Total CHOL/HDL Ratio: 5
Triglycerides: 240 mg/dL — ABNORMAL HIGH (ref 0.0–149.0)
VLDL: 48 mg/dL — ABNORMAL HIGH (ref 0.0–40.0)

## 2021-04-12 LAB — CBC WITH DIFFERENTIAL/PLATELET
Basophils Absolute: 0 10*3/uL (ref 0.0–0.1)
Basophils Relative: 0.5 % (ref 0.0–3.0)
Eosinophils Absolute: 0.3 10*3/uL (ref 0.0–0.7)
Eosinophils Relative: 4.2 % (ref 0.0–5.0)
HCT: 46 % (ref 39.0–52.0)
Hemoglobin: 15.4 g/dL (ref 13.0–17.0)
Lymphocytes Relative: 29.7 % (ref 12.0–46.0)
Lymphs Abs: 2.4 10*3/uL (ref 0.7–4.0)
MCHC: 33.4 g/dL (ref 30.0–36.0)
MCV: 90.9 fl (ref 78.0–100.0)
Monocytes Absolute: 0.7 10*3/uL (ref 0.1–1.0)
Monocytes Relative: 8.6 % (ref 3.0–12.0)
Neutro Abs: 4.6 10*3/uL (ref 1.4–7.7)
Neutrophils Relative %: 57 % (ref 43.0–77.0)
Platelets: 248 10*3/uL (ref 150.0–400.0)
RBC: 5.06 Mil/uL (ref 4.22–5.81)
RDW: 14.3 % (ref 11.5–15.5)
WBC: 8.1 10*3/uL (ref 4.0–10.5)

## 2021-04-12 LAB — COMPREHENSIVE METABOLIC PANEL
ALT: 15 U/L (ref 0–53)
AST: 19 U/L (ref 0–37)
Albumin: 4.3 g/dL (ref 3.5–5.2)
Alkaline Phosphatase: 77 U/L (ref 39–117)
BUN: 12 mg/dL (ref 6–23)
CO2: 29 mEq/L (ref 19–32)
Calcium: 9.5 mg/dL (ref 8.4–10.5)
Chloride: 100 mEq/L (ref 96–112)
Creatinine, Ser: 1.02 mg/dL (ref 0.40–1.50)
GFR: 72 mL/min (ref >60.00)
Glucose, Bld: 87 mg/dL (ref 70–99)
Potassium: 3.8 mEq/L (ref 3.5–5.1)
Sodium: 139 mEq/L (ref 135–145)
Total Bilirubin: 0.9 mg/dL (ref 0.2–1.2)
Total Protein: 7 g/dL (ref 6.0–8.3)

## 2021-04-12 LAB — HEMOGLOBIN A1C: Hgb A1c MFr Bld: 5.6 % (ref 4.6–6.5)

## 2021-04-12 LAB — T3, FREE: T3, Free: 3 pg/mL (ref 2.3–4.2)

## 2021-04-12 LAB — VITAMIN B12: Vitamin B-12: 406 pg/mL (ref 211–911)

## 2021-04-12 LAB — VITAMIN D 25 HYDROXY (VIT D DEFICIENCY, FRACTURES): VITD: 63.67 ng/mL (ref 30.00–100.00)

## 2021-04-12 LAB — T4, FREE: Free T4: 1 ng/dL (ref 0.60–1.60)

## 2021-04-12 LAB — PSA: PSA: 7.58 ng/mL — ABNORMAL HIGH (ref 0.10–4.00)

## 2021-04-12 LAB — TSH: TSH: 2.84 u[IU]/mL (ref 0.35–5.50)

## 2021-04-12 LAB — LDL CHOLESTEROL, DIRECT: Direct LDL: 224 mg/dL

## 2021-04-12 MED ORDER — FLUTICASONE PROPIONATE 50 MCG/ACT NA SUSP: 2.0000 | Freq: Every day | NASAL | 5 refills | Status: DC

## 2021-04-12 MED ORDER — LISINOPRIL 10 MG PO TABS: 10.0000 mg | ORAL_TABLET | Freq: Every day | ORAL | 1 refills | Status: DC

## 2021-04-12 MED ORDER — AMLODIPINE BESYLATE 10 MG PO TABS: 10.0000 mg | ORAL_TABLET | Freq: Every day | ORAL | 1 refills | Status: DC

## 2021-04-12 NOTE — Patient Instructions (Signed)
Great to see you today.  I have refilled the medication(s) we provide.   If labs were collected, we will inform you of lab results once received either by echart message or telephone call.   - echart message- for normal results that have been seen by the patient already.   - telephone call: abnormal results or if patient has not viewed results in their echart.   Health Maintenance After Age 75 After age 75, you are at a higher risk for certain long-term diseases and infections as well as injuries from falls. Falls are a major cause of broken bones and head injuries in people who are older than age 75. Getting regular preventive care can help to keep you healthy and well. Preventive care includes getting regular testing and making lifestyle changes as recommended by your health care provider. Talk with your health care provider about: Which screenings and tests you should have. A screening is a test that checks for a disease when you have no symptoms. A diet and exercise plan that is right for you. What should I know about screenings and tests to prevent falls? Screening and testing are the best ways to find a health problem early. Early diagnosis and treatment give you the best chance of managing medical conditions that are common after age 75. Certain conditions and lifestyle choices may make you more likely to have a fall. Your health care provider may recommend: Regular vision checks. Poor vision and conditions such as cataracts can make you more likely to have a fall. If you wear glasses, make sure to get your prescription updated if your vision changes. Medicine review. Work with your health care provider to regularly review all of the medicines you are taking, including over-the-counter medicines. Ask your health care provider about any side effects that may make you more likely to have a fall. Tell your health care provider if any medicines that you take make you feel dizzy or  sleepy. Osteoporosis screening. Osteoporosis is a condition that causes the bones to get weaker. This can make the bones weak and cause them to break more easily. Blood pressure screening. Blood pressure changes and medicines to control blood pressure can make you feel dizzy. Strength and balance checks. Your health care provider may recommend certain tests to check your strength and balance while standing, walking, or changing positions. Foot health exam. Foot pain and numbness, as well as not wearing proper footwear, can make you more likely to have a fall. Depression screening. You may be more likely to have a fall if you have a fear of falling, feel emotionally low, or feel unable to do activities that you used to do. Alcohol use screening. Using too much alcohol can affect your balance and may make you more likely to have a fall. What actions can I take to lower my risk of falls? General instructions Talk with your health care provider about your risks for falling. Tell your health care provider if: You fall. Be sure to tell your health care provider about all falls, even ones that seem minor. You feel dizzy, sleepy, or off-balance. Take over-the-counter and prescription medicines only as told by your health care provider. These include any supplements. Eat a healthy diet and maintain a healthy weight. A healthy diet includes low-fat dairy products, low-fat (lean) meats, and fiber from whole grains, beans, and lots of fruits and vegetables. Home safety Remove any tripping hazards, such as rugs, cords, and clutter. Install safety equipment such as   grab bars in bathrooms and safety rails on stairs. Keep rooms and walkways well-lit. Activity  Follow a regular exercise program to stay fit. This will help you maintain your balance. Ask your health care provider what types of exercise are appropriate for you. If you need a cane or walker, use it as recommended by your health care provider. Wear  supportive shoes that have nonskid soles. Lifestyle Do not drink alcohol if your health care provider tells you not to drink. If you drink alcohol, limit how much you have: 0-1 drink a day for women. 0-2 drinks a day for men. Be aware of how much alcohol is in your drink. In the [place] one drink equals one typical bottle of beer (12 oz), one-half glass of wine (5 oz), or one shot of hard liquor (1 oz). Do not use any products that contain nicotine or tobacco, such as cigarettes and e-cigarettes. If you need help quitting, ask your health care provider. Summary Having a healthy lifestyle and getting preventive care can help to protect your health and wellness after age 75. Screening and testing are the best way to find a health problem early and help you avoid having a fall. Early diagnosis and treatment give you the best chance for managing medical conditions that are more common for people who are older than age 75. Falls are a major cause of broken bones and head injuries in people who are older than age 75. Take precautions to prevent a fall at home. Work with your health care provider to learn what changes you can make to improve your health and wellness and to prevent falls. This information is not intended to replace advice given to you by your health care provider. Make sure you discuss any questions you have with your health care provider. Document Revised: 09/08/2020 Document Reviewed: 06/16/2020 Elsevier Patient Education  2022 [employer]  

## 2021-04-12 NOTE — Progress Notes (Signed)
This visit occurred during the SARS-CoV-2 public health emergency.  Safety protocols were in place, including screening questions prior to the visit, additional usage of staff PPE, and extensive cleaning of exam room while observing appropriate contact time as indicated for disinfecting solutions.    Patient ID: Nathan [name], male  DOB: May 10, 1946, 75 y.o.   MRN: [id number] Patient Care Team    Relationship Specialty Notifications Start End  Nathan Hillock, DO PCP - General Family Medicine  12/07/15   Pa, [facility]    06/02/17   Nathan Koyanagi, MD Attending Physician Orthopedic Surgery  06/02/17   Nathan Pier, MD Consulting Physician Oncology  05/29/20   Nathan Boston, MD Consulting Physician General Surgery  06/19/20   Nathan Priest, MD Consulting Physician Cardiology  06/19/20   Armbruster, Carlota Raspberry, MD Consulting Physician Gastroenterology  06/26/20     Chief Complaint  Patient presents with   Annual Exam    Pt is fasting    Subjective: Nathan [name] is a 75 y.o. male present for [place]. All past medical history, surgical history, allergies, family history, immunizations, medications and social history were updated in the electronic medical record today. All recent labs, ED visits and hospitalizations within the last year were reviewed.  Hypertension/atheroscolerosis of aorta/statin declined: Pt reports compliance amlodipine 10 mg daily and he has not been taking his Lisinopril. Patient denies chest pain, shortness of breath, dizziness or lower extremity edema.   he has switched to a plant based diet.  He has refused statin medication. age and doe snot believe in statins.  BMP: 05/12/2017, GFR >60, creatinine 1.06 CBC: 06/28/2016 hemoglobin 11.6, hematocrit 33.7. Lipid: 02/2014- chol 311, HDL 82, ldl 207, tg 105 Diet: Avoid adding sodium Exercise: Has started to exercise routinely. Using stationary bike 6 times a week for approximately 20-30  minutes. RF: Noncompliance, hypertension, former smoker, fhx heart disease, BMI>25   Health maintenance:  Colonoscopy: last screen 03/2020. Rectal cancer s/p robotic LAR 06/2020.[name] Has colonoscopy scheduled in Nov. 2022.  Immunizations:  tdap 2014 UTD, influenza declines, PNA series declines, zostavax declines, covid declines Infectious disease screening:Hep C declines. PSA: Followed by urology, last appt 6 mos ago.  PSA:  Lab Results  Component Value Date   PSA 5.99 (H) 12/06/2019   PSA 4.61 (H) 12/15/2015   PSA 5.77 (H) 03/03/2014  , pt was counseled on prostate cancer screenings.  Assistive device: none Oxygen WHQ:PRFF Patient has a Dental home. Hospitalizations/ED visits: reviewed  Depression screen [facility] 2/9 04/12/2021 09/20/2020 04/20/2018 06/02/2017 05/12/2017  Decreased Interest 0 0 0 0 0  Down, Depressed, Hopeless 0 0 0 0 0  PHQ - 2 Score 0 0 0 0 0  Altered sleeping - - 0 0 -  Tired, decreased energy - - 1 0 -  Change in appetite - - 0 0 -  Feeling bad or failure about yourself  - - 0 0 -  Trouble concentrating - - 0 0 -  Moving slowly or fidgety/restless - - 0 0 -  Suicidal thoughts - - 0 0 -  PHQ-9 Score - - 1 0 -  Difficult doing work/chores - - Not difficult at all Not difficult at all -   GAD 7 : Generalized Anxiety Score 08/24/2015  Nervous, Anxious, on Edge 2  Control/stop worrying 3  Worry too much - different things 3  Trouble relaxing 1  Restless 0  Easily annoyed or irritable 2  Afraid - awful might  happen 0  Total GAD 7 Score 11  Anxiety Difficulty Somewhat difficult     Fall Risk  04/12/2021 09/20/2020 02/04/2020 04/20/2018 06/02/2017  Falls in the past year? 0 1 1 No No  Number falls in past yr: 0 0 0 - -  Injury with Fall? 0 0 0 - -  Risk for fall due to : - History of fall(s) - - -  Follow up Falls evaluation completed Falls prevention discussed - - -   Immunization History  Administered Date(s) Administered   PFIZER(Purple Top)SARS-COV-2 Vaccination  09/20/2019, 10/20/2019, 07/03/2020   PPD Test 03/17/2013   Tdap 07/15/2012   Zoster, Live 02/06/2015   Past Medical History:  Diagnosis Date   Adrenal mass ([place]) 2018   Urology fully evaluated; benign   Allergy    hay fever   Anemia    resolved spontaneously   Anxiety    Cataract    Cervicalgia    Complication of anesthesia    hypoglycemia, low blood pressure, pt. reports it dropped to zero- post op MORPHINE , then taken ICU due to either the morphine or hypoglycemia . In addition post op from cataract surgery- 07/2015, experienced an episode of hypoglycemia     Depressive disorder, not elsewhere classified    Diverticulosis    Elevated prostate specific antigen (PSA) 2008   Dr Jeffie Pollock   Fecal occult blood test positive 02/07/2020   Gallstone    GERD (gastroesophageal reflux disease)    H/O cardiovascular stress test >10 yrs.   told that its wnl. Told to drink less coffee   Hematuria 2018   Hematuria with enlarged prostate, kidney cyst, adrenal adenoma--> followed by urology with cystoscopy and repeat CT, benign workup.   Hx of migraines    Hypertension    Hyperthyroidism    Treated with medication no longer needed at this time   Hypoglycemia    IBS (irritable bowel syndrome)    patient not aware   Inguinal hernia 12/2015   Bilateral small indirect hernias on CT, umbilical hernia also present.   Kidney cyst, acquired 2018   Followed by urology, benign.   Low back pain    Macular degeneration (senile) of retina, unspecified    2 different opinions from 2 different Ophth   Osteoarthrosis, unspecified whether generalized or localized, unspecified site    Other and unspecified hyperlipidemia    Prostatitis 2008   Rectal cancer ([place])    Refusal of blood transfusions as patient is Jehovah's Witness    Segmental and somatic dysfunction of cervical region    Segmental and somatic dysfunction of lumbar region    Segmental and somatic dysfunction of sacral region    Segmental and  somatic dysfunction of thoracic region    Spondylosis of lumbar spine    Strain of pelvis    Tuberculosis    had exposure as child tests positive on skin test.   Allergies  Allergen Reactions   Morphine And Related Other (See Comments)    Severe drop in blood pressure   Citalopram Hydrobromide Other (See Comments)    PATIENT PREFERENCE: "Just ineffective"   Sertraline Hcl Other (See Comments)    Headache   Past Surgical History:  Procedure Laterality Date   BIOPSY  05/24/2020   Procedure: BIOPSY;  Surgeon: Irving Copas., MD;  Location: [facility] ENDOSCOPY;  Service: Gastroenterology;;   CATARACT EXTRACTION, BILATERAL     COLONOSCOPY  2012   Dr Olevia Perches   COLONOSCOPY WITH PROPOFOL N/A 05/24/2020  Procedure: COLONOSCOPY WITH PROPOFOL;  Surgeon: Mansouraty, Telford Nab., MD;  Location: [place];  Service: Gastroenterology;  Laterality: N/A;   CYSTOSCOPY     for hematuria   EUS N/A 05/24/2020   Procedure: LOWER ENDOSCOPIC ULTRASOUND (EUS);  Surgeon: Irving Copas., MD;  Location: [place];  Service: Gastroenterology;  Laterality: N/A;   hydrocoelectomy  2003   Dr Jeffie Pollock   KNEE ARTHROSCOPY Left 1994   OSTOMY N/A 06/21/2020   Procedure: POSSIBLE OSTOMY;  Surgeon: Nathan Boston, MD;  Location: [facility];  Service: General;  Laterality: N/A;   POLYPECTOMY  05/24/2020   Procedure: POLYPECTOMY;  Surgeon: Irving Copas., MD;  [facility] ENDOSCOPY;  Service: Gastroenterology;;   PROCTOSCOPY N/A 06/21/2020   Procedure: RIGID PROCTOSCOPY;  Surgeon: Nathan Boston, MD;  Location: [facility];  Service: General;  Laterality: N/A;   prostate nodule resection  2003   TONSILLECTOMY     TOTAL KNEE ARTHROPLASTY  2009   TOTAL KNEE ARTHROPLASTY Right 06/27/2016   Procedure: TOTAL KNEE ARTHROPLASTY;  Surgeon: Nathan Koyanagi, MD;  Location: [place];  Service: Orthopedics;  Laterality: Right;   XI ROBOTIC ASSISTED LOWER ANTERIOR RESECTION N/A 06/21/2020   Procedure: XI ROBOTIC ASSISTED  LOWER ANTERIOR RECTOSIGMOID RESECTION, BILATERAL TAP BLOCK;  Surgeon: Nathan Boston, MD;  Location: [facility];  Service: General;  Laterality: N/A;   Family History  Problem Relation Age of Onset   Lung cancer Father        smoker   Alcohol abuse Father    Tuberculosis Father    Heart disease Father    Early death Father    Diabetes Other    Heart disease Mother    Heart attack Brother    Stroke Neg Hx    Thyroid disease Neg Hx    Colon cancer Neg Hx    Stomach cancer Neg Hx    Rectal cancer Neg Hx    Esophageal cancer Neg Hx    Inflammatory bowel disease Neg Hx    Liver disease Neg Hx    Pancreatic cancer Neg Hx    Social History   Social History Narrative   Married. Wife's name is [name]. Full time employed, Pensions consultant.   Drinks caffeinated beverages. Take a multivitamin. Wears his seatbelt.   Exercises at least 3 times a week. Is not a strict vegetarian, but only eats meat 1-2 times a week   Wears a hearing aid. Does not wear dentures or use an assistive walking device.   There is a smoke detector in his home.   He feels safe in his relationships.    Allergies as of 04/12/2021       Reactions   Morphine And Related Other (See Comments)   Severe drop in blood pressure   Citalopram Hydrobromide Other (See Comments)   PATIENT PREFERENCE: "Just ineffective"   Sertraline Hcl Other (See Comments)   Headache        Medication List        Accurate as of April 12, 2021 10:33 AM. If you have any questions, ask your nurse or doctor.          STOP taking these medications    metroNIDAZOLE 500 MG tablet Commonly known as: FLAGYL Stopped by: Howard Pouch, DO   omeprazole 20 MG capsule Commonly known as: PRILOSEC Stopped by: Howard Pouch, DO   PROBIOTIC-10 PO Stopped by: Howard Pouch, DO   SLEEP AID PO Stopped by: Howard Pouch, DO   traMADol 50 MG  tablet Commonly known as: ULTRAM Stopped by: Howard Pouch, DO   Valerian 500 MG  Caps Stopped by: Howard Pouch, DO       TAKE these medications    amLODipine 10 MG tablet Commonly known as: NORVASC Take 1 tablet (10 mg total) by mouth daily.   b complex vitamins tablet Take 1 tablet by mouth every other day.   BRAINSTRONG MEMORY SUPPORT PO Take 2 tablets by mouth daily.   fluticasone 50 MCG/ACT nasal spray Commonly known as: FLONASE Place 2 sprays into both nostrils daily.   lisinopril 10 MG tablet Commonly known as: ZESTRIL Take 1 tablet (10 mg total) by mouth daily. What changed:  medication strength how much to take additional instructions Changed by: Howard Pouch, DO   MIRALAX PO Take 17 g by mouth daily.   Multiple Vitamin tablet Take 1 tablet by mouth daily.       All past medical history, surgical history, allergies, family history, immunizations andmedications were updated in the EMR today and reviewed under the history and medication portions of their EMR.     No results found for this or any previous visit (from the past 2160 hour(s)).  No results found.  ROS: 14 pt review of systems performed and negative (unless mentioned in an HPI)  Objective: BP (!) 148/77   Pulse 61   Temp 97.6 F (36.4 C) (Oral)   Ht 5\' 8"  (1.727 m)   Wt 184 lb (83.5 kg)   SpO2 99%   BMI 27.98 kg/m  Gen: Afebrile. No acute distress. Nontoxic in appearance, well-developed, well-nourished,  very pleasant male HENT: AT. [place]. Bilateral TM visualized and normal in appearance, normal external auditory canal. MMM, no oral lesions, adequate dentition. Bilateral nares within normal limits. Throat without erythema, ulcerations or exudates. no Cough on exam, no hoarseness on exam. Eyes:Pupils Equal Round Reactive to light, Extraocular movements intact,  Conjunctiva without redness, discharge or icterus. Neck/lymp/endocrine: Supple,no lymphadenopathy, no thyromegaly CV: RRR no murmur, no edema, +2/4 P posterior tibialis pulses.  Chest: CTAB, no wheeze, rhonchi or  crackles. normal Respiratory effort. good Air movement. Abd: Soft. flat. NTND. BS present. no Masses palpated. No hepatosplenomegaly. No rebound tenderness or guarding. Skin: no rashes, purpura or petechiae. Warm and well-perfused. Skin intact. Neuro/Msk: Normal gait. PERLA. EOMi. Alert. Oriented x3.  Cranial nerves II through XII intact. Muscle strength 5/5 upper/lower extremity. DTRs equal bilaterally. Psych: Normal affect, dress and demeanor. Normal speech. Normal thought content and judgment.  No results found.  Assessment/plan: CAINEN BURNHAM is a 75 y.o. male present for CPE/CMC Essential hypertension, benign/Atherosclerosis of aorta (HCC)/Overweight (BMI 25.0-29.9)/Mixed hyperlipidemia/Statin declined Above goal.  Continue amlodipine 10 mg qd Restart lisinopril at 10 mg qd.  - statin declined.  - CBC with Differential/Platelet - Comprehensive metabolic panel - Lipid panel - TSH  Hyperthyroidism Significant hyperthyroid state last summer, that has since resolved. He is no longer prescribed medication for his thyroid. He does complain of fatigue today.  - TSH - T4, free - T3, free  Anemia, unspecified type/Rectal cancer s/p robotic LAR 06/21/2020 - cbc - Iron, TIBC and Ferritin Panel Hyperglycemia - Hemoglobin A1c Hypocalcemia - Vitamin D (25 hydroxy) Elevated PSA - follows with uro - PSA Fatigue/Vegetarian Could be multifactorial. Fatigue initially felt to be 2/2 to d/t anemia and thyroid d/o vs rectal cancer. Now he is following a plant based diet for his health and may be lacking in vitamins.  - B12 - iron - vit d -  discussed protein sources and vitamins  Encounter for general adult medical examination with abnormal findings Colonoscopy: last screen 03/2020. Rectal cancer s/p robotic LAR 06/2020.[name] Has colonoscopy scheduled in Nov. 2022.  Immunizations:  tdap 2014 UTD, influenza declines, PNA series declines, zostavax declines, covid declines Infectious disease  screening:Hep C declines. PSA: Followed by urology, last appt 6 mos ago. Patient was encouraged to exercise greater than 150 minutes a week. Patient was encouraged to choose a diet filled with fresh fruits and vegetables, and lean meats. AVS provided to patient today for education/recommendation on gender specific health and safety maintenance. Return in about 5 months (around 09/26/2021) for [place] (30 min).   Orders Placed This Encounter  Procedures   CBC with Differential/Platelet   Comprehensive metabolic panel   Hemoglobin A1c   Lipid panel   TSH   T4, free   PSA   T3, free   Vitamin D (25 hydroxy)   B12   Iron, TIBC and Ferritin Panel   Meds ordered this encounter  Medications   fluticasone (FLONASE) 50 MCG/ACT nasal spray    Sig: Place 2 sprays into both nostrils daily.    Dispense:  16 mL    Refill:  5   amLODipine (NORVASC) 10 MG tablet    Sig: Take 1 tablet (10 mg total) by mouth daily.    Dispense:  90 tablet    Refill:  1   lisinopril (ZESTRIL) 10 MG tablet    Sig: Take 1 tablet (10 mg total) by mouth daily.    Dispense:  90 tablet    Refill:  1   Referral Orders  No referral(s) requested today     Note is dictated utilizing voice recognition software. Although note has been proof read prior to signing, occasional typographical errors still can be missed. If any questions arise, please do not hesitate to call for verification.  Electronically signed by: Howard Pouch, DO [place]

## 2021-04-13 LAB — IRON,TIBC AND FERRITIN PANEL
%SAT: 35 % (calc) (ref 20–48)
Ferritin: 45 ng/mL (ref 24–380)
Iron: 133 ug/dL (ref 50–180)
TIBC: 383 mcg/dL (calc) (ref 250–425)

## 2021-04-16 ENCOUNTER — Telehealth: Payer: Self-pay

## 2021-04-16 ENCOUNTER — Telehealth: Payer: Self-pay | Admitting: Family Medicine

## 2021-04-16 DIAGNOSIS — R5383 Other fatigue: Secondary | ICD-10-CM

## 2021-04-16 DIAGNOSIS — I700 Atherosclerosis of aorta: Secondary | ICD-10-CM

## 2021-04-16 DIAGNOSIS — C20 Malignant neoplasm of rectum: Secondary | ICD-10-CM

## 2021-04-16 DIAGNOSIS — E782 Mixed hyperlipidemia: Secondary | ICD-10-CM

## 2021-04-16 DIAGNOSIS — I2510 Atherosclerotic heart disease of native coronary artery without angina pectoris: Secondary | ICD-10-CM

## 2021-04-16 DIAGNOSIS — R972 Elevated prostate specific antigen [PSA]: Secondary | ICD-10-CM

## 2021-04-16 MED ORDER — ROSUVASTATIN CALCIUM 20 MG PO TABS: 20.0000 mg | ORAL_TABLET | Freq: Every day | ORAL | 3 refills | Status: DC

## 2021-04-16 NOTE — Addendum Note (Signed)
Addended by: Howard Pouch A on: 04/16/2021 04:47 PM   Modules accepted: Orders

## 2021-04-16 NOTE — Telephone Encounter (Signed)
Patient called back to speak with Nathan [name] again with what they were discussing

## 2021-04-16 NOTE — Telephone Encounter (Signed)
Pt scheduled with Cardio 11/21. Pt would like to start medication

## 2021-04-16 NOTE — Telephone Encounter (Signed)
Nathan [name] (11:34 AM)   Patient called back to speak with Adolm Joseph again with what they were discussing

## 2021-04-16 NOTE — Telephone Encounter (Signed)
Please call patient Nathan [name], Nathan [name] and Nathan [name] function are normal Blood cell counts, iron  and electrolytes are normal Vit d is normal.  B12 is low normal. I would encourage him to start a 1000 mcg SUBLINGUAL b12 daily. This comes in solution or tablet to place under the tongue and is better absorbed.  Diabetes screening/A1c is normal  Cholesterol panel is extremely high. His LDL/bad cholesterol is 224- His LDL should be < 100 at least and < 70 is best.   His PSA- prostate screen is also higher than his normal at 7.58. This is the highest it has been. I am not certain if this is secondary to his h/o rectal carcinoma or a more worrisome cause. I would recommend he follow up with his urologist for further recommendations.   As for his cholesterol, coronary artery disease could certainly also cause fatigue if not enough blood is getting to heart. I would recommend he retry crestor (not the one that caused constipation) and follow up with cardiology for further recs.    If agreeable to start crestor, I will call it in for him. Please advise I have placed referrals back to urology and cardiology for him.

## 2021-04-16 NOTE — Telephone Encounter (Signed)
Please see other encounter.

## 2021-04-16 NOTE — Telephone Encounter (Signed)
Spoke with pt regarding labs and instructions. Pt states he is not concerned of the PSA but will call cardiology.

## 2021-04-16 NOTE — Telephone Encounter (Signed)
Statin prescribed

## 2021-04-16 NOTE — Telephone Encounter (Signed)
LVM for pt to CB regarding any add'l questions and to leave a message with specific questions.

## 2021-04-18 ENCOUNTER — Encounter: Payer: Medicare Other | Admitting: Family Medicine

## 2021-05-21 ENCOUNTER — Telehealth: Payer: Self-pay | Admitting: Gastroenterology

## 2021-05-21 NOTE — Telephone Encounter (Signed)
Dr. Havery Moros, [place] to proceed with scheduling recall colonoscopy in the [facility] with you? Please advise, thanks

## 2021-05-21 NOTE — Telephone Encounter (Signed)
Inbound call from patient calling to schedule recall colon. The recall is with Dr. Havery Moros but Dr. Rush Landmark was the last provider to see the patient. The next available morning procedure is January and patient does not want to wait till then and insist he speaks with a nurse.

## 2021-05-21 NOTE — Telephone Encounter (Signed)
Yes okay to book colonoscopy in the [place] with me. thanks

## 2021-05-22 NOTE — Telephone Encounter (Signed)
Returned call to patient to schedule his recall colonoscopy. Pt has been scheduled for a virtual pre-visit on Monday, 07/23/21 at 10 am. Pt has been advised to disregard reminder call that tells patient to come in, he is aware that this is a phone call. Pt is scheduled for a colonoscopy in the [facility] with Dr. Havery Moros on Monday, 08/06/21 at 10:30 am. Pt is aware that he will need to arrive on the 4th floor by 9:30 am with a care partner. Pt verbalized understanding and had no concerns at the end of the call.

## 2021-05-30 ENCOUNTER — Ambulatory Visit: Payer: Medicare Other | Admitting: Orthopaedic Surgery

## 2021-05-30 ENCOUNTER — Ambulatory Visit: Payer: Self-pay

## 2021-05-30 ENCOUNTER — Other Ambulatory Visit: Payer: Self-pay

## 2021-05-30 DIAGNOSIS — M542 Cervicalgia: Secondary | ICD-10-CM

## 2021-05-30 NOTE — Progress Notes (Signed)
Office Visit Note   Patient: Nathan [name]           Date of Birth: [birth year]           MRN: [id number] Visit Date: 05/30/2021              Requested by: Ma Hillock, DO [address] [place],  [place] [address] PCP: Ma Hillock, DO   Assessment & Plan: Visit Diagnoses:  1. Cervicalgia     Plan: Impression is cervicalgia.  The patient is not currently getting any radicular symptoms and he has noticed slight improvement with his guided home exercise program.  He would like to continue this in addition to possibly starting outpatient physical therapy for another 3 to 4 weeks.  If his symptoms have not continued to improve, he will let us know and we will order an MRI of the cervical spine to assess for structural abnormalities.  At that point, we have also discussed possible referral to neurosurgery.  He will otherwise follow-up with [place] as needed.  Follow-Up Instructions: Return if symptoms worsen or fail to improve.   Orders:  Orders Placed This Encounter  Procedures   XR Cervical Spine 2 or 3 views   Ambulatory referral to Physical Therapy   No orders of the defined types were placed in this encounter.     Procedures: No procedures performed   Clinical Data: No additional findings.   Subjective: Chief Complaint  Patient presents with   Neck - Pain    HPI patient is a pleasant 75 year old gentleman who comes in today with chronic neck pain for the past several years.  The pain he has is throughout the entire neck worse with rotation.  He has been using ice as well as massage gun without significant relief.  He has been to a Restaurant manager, fast food and has been working on a guided home exercise program for over 4 weeks.  He has noticed slight improvement in symptoms following the exercises.  He denies any numbness, tingling or burning to either upper extremity.  No weakness.  No previous surgery or epidural steroid injection to the cervical spine.  Review of Systems as  detailed in HPI.  All others reviewed and are negative.   Objective: Vital Signs: There were no vitals taken for this visit.  Physical Exam well-developed well-nourished gentleman in no acute distress.  Alert and oriented x3.  Ortho Exam cervical spine exam shows no spinous tenderness he has mild paraspinous tenderness both sides.  Increased pain with cervical spine rotation.  Minimal pain with flexion extension.  No focal weakness.  He is neurovascular intact distally.  Specialty Comments:  No specialty comments available.  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 05/20/2020   Fatigue 02/07/2020   Anemia 02/07/2020   Hyperthyroidism 02/04/2020   Statin declined 09/15/2019   Left ventricular apical thrombus 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 reports it dropped to zero- post op MORPHINE , then taken ICU due to either the morphine or hypoglycemia . In addition post op from cataract surgery- 07/2015, experienced an episode of hypoglycemia      Depressive disorder, not elsewhere classified    Diverticulosis    Elevated prostate specific antigen (PSA) 2008   Dr Jeffie Pollock   Fecal occult blood test positive 02/07/2020   Gallstone    GERD (gastroesophageal reflux disease)    H/O cardiovascular stress test >10 yrs.   told that its wnl. Told to drink less coffee   Hematuria 2018   Hematuria with enlarged prostate, kidney cyst, adrenal adenoma--> followed by urology with cystoscopy and repeat CT, benign workup.   Hx of migraines    Hypertension    Hyperthyroidism    Treated with medication no longer needed at this time   Hypoglycemia    IBS (irritable bowel syndrome)    patient not aware   Inguinal hernia 12/2015   Bilateral small indirect hernias on CT, umbilical hernia also present.   Kidney cyst, acquired 2018   Followed by urology, benign.   Low back pain    Macular degeneration (senile) of retina, unspecified    2 different opinions from 2 different Ophth   Osteoarthrosis, unspecified whether generalized or localized, unspecified site    Other and unspecified hyperlipidemia    Prostatitis 2008   Rectal cancer ([place])    Refusal of blood transfusions as patient is Jehovah's Witness    Segmental and somatic dysfunction of cervical region    Segmental and somatic dysfunction of lumbar region    Segmental and somatic dysfunction of sacral region    Segmental and somatic dysfunction of thoracic region    Spondylosis of lumbar spine    Strain of pelvis    Tuberculosis    had exposure as child tests positive on skin test.    Family History  Problem Relation Age of Onset   Lung cancer Father        smoker   Alcohol abuse Father    Tuberculosis Father    Heart disease Father    Early death Father    Diabetes Other    Heart disease Mother    Heart attack Brother    Stroke Neg Hx    Thyroid disease Neg Hx    Colon cancer Neg Hx    Stomach cancer Neg Hx    Rectal cancer Neg Hx    Esophageal cancer Neg Hx    Inflammatory  bowel disease Neg Hx    Liver disease Neg Hx    Pancreatic cancer Neg Hx     Past Surgical History:  Procedure Laterality Date   BIOPSY  05/24/2020   Procedure: BIOPSY;  Surgeon: Rush Landmark, Telford Nab., MD;  Location: [facility] ENDOSCOPY;  Service: Gastroenterology;;   CATARACT EXTRACTION, BILATERAL     COLONOSCOPY  2012   Dr Olevia Perches   COLONOSCOPY WITH PROPOFOL N/A 05/24/2020   Procedure: COLONOSCOPY WITH PROPOFOL;  Surgeon: Irving Copas., MD;  Location: [facility] ENDOSCOPY;  Service: Gastroenterology;  Laterality: N/A;  CYSTOSCOPY     for hematuria   EUS N/A 05/24/2020   Procedure: LOWER ENDOSCOPIC ULTRASOUND (EUS);  Surgeon: Irving Copas., MD;  Location: [facility];  Service: Gastroenterology;  Laterality: N/A;   hydrocoelectomy  2003   Dr Jeffie Pollock   KNEE ARTHROSCOPY Left 1994   OSTOMY N/A 06/21/2020   Procedure: POSSIBLE OSTOMY;  Surgeon: Michael Boston, MD;  Location: [facility] ORS;  Service: General;  Laterality: N/A;   POLYPECTOMY  05/24/2020   Procedure: POLYPECTOMY;  Surgeon: Irving Copas., MD;  Location: [facility] ENDOSCOPY;  Service: Gastroenterology;;   PROCTOSCOPY N/A 06/21/2020   Procedure: RIGID PROCTOSCOPY;  Surgeon: Michael Boston, MD;  Location: [facility];  Service: General;  Laterality: N/A;   prostate nodule resection  2003   TONSILLECTOMY     TOTAL KNEE ARTHROPLASTY  2009   TOTAL KNEE ARTHROPLASTY Right 06/27/2016   Procedure: TOTAL KNEE ARTHROPLASTY;  Surgeon: Leandrew Koyanagi, MD;  Location: [facility];  Service: Orthopedics;  Laterality: Right;   XI ROBOTIC ASSISTED LOWER ANTERIOR RESECTION N/A 06/21/2020   Procedure: XI ROBOTIC ASSISTED LOWER ANTERIOR RECTOSIGMOID RESECTION, BILATERAL TAP BLOCK;  Surgeon: Michael Boston, MD;  Location: [facility];  Service: General;  Laterality: N/A;   Social History   Occupational History   Not on file  Tobacco Use   Smoking status: Former    Types: Cigarettes    Quit date: 07/15/1968    Years since quitting: 52.9   Smokeless tobacco:  Never   Tobacco comments:    smoked [place], up to 1ppd  Vaping Use   Vaping Use: Never used  Substance and Sexual Activity   Alcohol use: Not Currently    Comment: on occasion   Drug use: No   Sexual activity: Yes    Partners: Female    Birth control/protection: None

## 2021-06-04 ENCOUNTER — Ambulatory Visit: Payer: Medicare Other | Admitting: Cardiology

## 2021-06-18 ENCOUNTER — Encounter: Payer: Self-pay | Admitting: Gastroenterology

## 2021-07-23 ENCOUNTER — Other Ambulatory Visit: Payer: Self-pay

## 2021-07-23 ENCOUNTER — Ambulatory Visit (AMBULATORY_SURGERY_CENTER): Payer: Medicare Other | Admitting: *Deleted

## 2021-07-23 VITALS — Ht 70.5 in | Wt 185.0 lb

## 2021-07-23 DIAGNOSIS — C20 Malignant neoplasm of rectum: Secondary | ICD-10-CM

## 2021-07-23 MED ORDER — NA SULFATE-K SULFATE-MG SULF 17.5-3.13-1.6 GM/177ML PO SOLN: 1.0000 | ORAL | 0 refills | Status: DC

## 2021-07-23 NOTE — Progress Notes (Signed)
Patient's pre-visit was done today over the phone with the patient. Name,DOB and address verified. Patient denies any allergies to Eggs and Soy. Patient denies any problems with anesthesia/sedation. Patient is not taking any diet pills or blood thinners. No home Oxygen. Prep instructions sent to pt's MyChart (if activated).Patient understands to call us back with any questions or concerns. Patient is aware of our care-partner policy and HQPRF-16 safety protocol.     The patient is COVID-19 vaccinated.

## 2021-07-31 ENCOUNTER — Encounter: Payer: Self-pay | Admitting: Gastroenterology

## 2021-08-04 ENCOUNTER — Encounter: Payer: Self-pay | Admitting: Certified Registered Nurse Anesthetist

## 2021-08-06 ENCOUNTER — Encounter: Payer: Medicare Other | Admitting: Gastroenterology

## 2021-08-06 ENCOUNTER — Encounter: Payer: Self-pay | Admitting: Gastroenterology

## 2021-08-06 ENCOUNTER — Ambulatory Visit (AMBULATORY_SURGERY_CENTER): Payer: Medicare Other | Admitting: Gastroenterology

## 2021-08-06 VITALS — BP 144/62 | HR 57 | Temp 97.8°F | Resp 14 | Ht 68.0 in | Wt 185.0 lb

## 2021-08-06 DIAGNOSIS — K635 Polyp of colon: Secondary | ICD-10-CM

## 2021-08-06 DIAGNOSIS — D120 Benign neoplasm of cecum: Secondary | ICD-10-CM

## 2021-08-06 DIAGNOSIS — Z85048 Personal history of other malignant neoplasm of rectum, rectosigmoid junction, and anus: Secondary | ICD-10-CM | POA: Diagnosis not present

## 2021-08-06 DIAGNOSIS — Z86010 Personal history of colonic polyps: Secondary | ICD-10-CM

## 2021-08-06 DIAGNOSIS — D122 Benign neoplasm of ascending colon: Secondary | ICD-10-CM

## 2021-08-06 DIAGNOSIS — K6289 Other specified diseases of anus and rectum: Secondary | ICD-10-CM

## 2021-08-06 DIAGNOSIS — C20 Malignant neoplasm of rectum: Secondary | ICD-10-CM

## 2021-08-06 MED ORDER — SODIUM CHLORIDE 0.9 % IV SOLN: 500.0000 mL | Freq: Once | INTRAVENOUS | Status: DC

## 2021-08-06 NOTE — Op Note (Signed)
[facility] Patient Name: Nathan [name] Procedure Date: 08/06/2021 10:03 AM MRN: [id number] Endoscopist: Remo Lipps P. Havery Moros , MD Age: 76 Referring MD:  Date of Birth: 1946-03-14 Gender: Male Account #: [id number] Procedure:                Colonoscopy Indications:              High risk colon cancer surveillance: Personal                            history of colon cancer - rectum / rectosigmoid                            cancer s/p resection 06/2020, here for                            surveillance. History of other polyps removed on                            prior exam and history of hypertrophied anal                            papillae with AIN Medicines:                Monitored Anesthesia Care Procedure:                Pre-Anesthesia Assessment:                           - Prior to the procedure, a History and Physical                            was performed, and patient medications and                            allergies were reviewed. The patient's tolerance of                            previous anesthesia was also reviewed. The risks                            and benefits of the procedure and the sedation                            options and risks were discussed with the patient.                            All questions were answered, and informed consent                            was obtained. Prior Anticoagulants: The patient has                            taken no previous anticoagulant or antiplatelet  agents. ASA Grade Assessment: II - A patient with                            mild systemic disease. After reviewing the risks                            and benefits, the patient was deemed in                            satisfactory condition to undergo the procedure.                           After obtaining informed consent, the colonoscope                            was passed under direct vision. Throughout the                             procedure, the patient's blood pressure, pulse, and                            oxygen saturations were monitored continuously. The                            CF [id number] #[id number] was introduced through the anus                            and advanced to the the cecum, identified by                            appendiceal orifice and ileocecal valve. The                            colonoscopy was performed without difficulty. The                            patient tolerated the procedure well. The quality                            of the bowel preparation was good. The ileocecal                            valve, appendiceal orifice, and rectum were                            photographed. Scope In: 10:12:58 AM Scope Out: 10:33:08 AM Scope Withdrawal Time: 0 hours 18 minutes 36 seconds  Total Procedure Duration: 0 hours 20 minutes 10 seconds  Findings:                 The perianal and digital rectal examinations were                            normal.  Two sessile polyps were found in the cecum. The                            polyps were diminutive in size. These polyps were                            removed with a cold snare. Resection and retrieval                            were complete.                           Three sessile polyps were found in the ascending                            colon. The polyps were 2 to 3 mm in size. These                            polyps were removed with a cold snare. Resection                            and retrieval were complete.                           Multiple small-mouthed diverticula were found in                            the entire colon.                           Anal papilla(e) was hypertrophied. Biopsies were                            taken with a cold forceps for histology to rule out                            AIN.                           There was evidence of a prior end-to-end                             colo-colonic anastomosis in the proximal rectum /                            recto-sigmoid colon. This was patent and was                            characterized by healthy appearing mucosa.                           The exam was otherwise without abnormality.                            Retroflexed views  were limited given small size of                            the rectum. Complications:            No immediate complications. Estimated blood loss:                            Minimal. Estimated Blood Loss:     Estimated blood loss was minimal. Impression:               - Two diminutive polyps in the cecum, removed with                            a cold snare. Resected and retrieved.                           - Three 2 to 3 mm polyps in the ascending colon,                            removed with a cold snare. Resected and retrieved.                           - Diverticulosis in the entire examined colon.                           - Anal papilla(e) was hypertrophied. Biopsied.                           - Patent end-to-end colo-colonic anastomosis,                            characterized by healthy appearing mucosa.                           - The examination was otherwise normal. Recommendation:           - Patient has a contact number available for                            emergencies. The signs and symptoms of potential                            delayed complications were discussed with the                            patient. Return to normal activities tomorrow.                            Written discharge instructions were provided to the                            patient.                           - Resume previous diet.                           -  Continue present medications.                           - Await pathology results. Anticipate repeat                            colonoscopy in 3 years for surveillance Remo Lipps P. Ceylin Dreibelbis, MD 08/06/2021 10:40:36 AM This report  has been signed electronically.

## 2021-08-06 NOTE — Patient Instructions (Signed)
Discharge instructions given. ?Handouts on polyps and Hemorrhoids. ?Resume previous medications. ?YOU HAD AN ENDOSCOPIC PROCEDURE TODAY AT THE [facility]:   Refer to the procedure report that was given to you for any specific questions about what was found during the examination.  If the procedure report does not answer your questions, please call your gastroenterologist to clarify.  If you requested that your care partner not be given the details of your procedure findings, then the procedure report has been included in a sealed envelope for you to review at your convenience later. ? ?YOU SHOULD EXPECT: Some feelings of bloating in the abdomen. Passage of more gas than usual.  Walking can help get rid of the air that was put into your GI tract during the procedure and reduce the bloating. If you had a lower endoscopy (such as a colonoscopy or flexible sigmoidoscopy) you may notice spotting of blood in your stool or on the toilet paper. If you underwent a bowel prep for your procedure, you may not have a normal bowel movement for a few days. ? ?Please Note:  You might notice some irritation and congestion in your nose or some drainage.  This is from the oxygen used during your procedure.  There is no need for concern and it should clear up in a day or so. ? ?SYMPTOMS TO REPORT IMMEDIATELY: ? ?Following lower endoscopy (colonoscopy or flexible sigmoidoscopy): ? Excessive amounts of blood in the stool ? Significant tenderness or worsening of abdominal pains ? Swelling of the abdomen that is new, acute ? Fever of 100?F or higher ? ? ?For urgent or emergent issues, a gastroenterologist can be reached at any hour by calling [phone]. ?Do not use MyChart messaging for urgent concerns.  ? ? ?DIET:  We do recommend a small meal at first, but then you may proceed to your regular diet.  Drink plenty of fluids but you should avoid alcoholic beverages for 24 hours. ? ?ACTIVITY:  You should plan to take it  easy for the rest of today and you should NOT DRIVE or use heavy machinery until tomorrow (because of the sedation medicines used during the test).   ? ?FOLLOW UP: ?Our staff will call the number listed on your records 48-72 hours following your procedure to check on you and address any questions or concerns that you may have regarding the information given to you following your procedure. If we do not reach you, we will leave a message.  We will attempt to reach you two times.  During this call, we will ask if you have developed any symptoms of COVID 19. If you develop any symptoms (ie: fever, flu-like symptoms, shortness of breath, cough etc.) before then, please call [phone].  If you test positive for Covid 19 in the 2 weeks post procedure, please call and report this information to us.   ? ?If any biopsies were taken you will be contacted by phone or by letter within the next 1-3 weeks.  Please call us at [phone] if you have not heard about the biopsies in 3 weeks.  ? ? ?SIGNATURES/CONFIDENTIALITY: ?You and/or your care partner have signed paperwork which will be entered into your electronic medical record.  These signatures attest to the fact that that the information above on your After Visit Summary has been reviewed and is understood.  Full responsibility of the confidentiality of this discharge information lies with you and/or your care-partner.  ?

## 2021-08-06 NOTE — Progress Notes (Signed)
[facility] History and Physical   Primary Care Physician:  Ma Hillock, DO   Reason for Procedure:   Surveillance - history of colon cancer  Plan:    colonoscopy     HPI: Nathan [name] is a 76 y.o. 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 Patient denies any bowel problems / pain at this time. He did not need chemotherapy or XRT, had localized disease thought to have been treated with surgery. Otherwise feels well without any cardiopulmonary symptoms.    Past Medical History:  Diagnosis Date   Adrenal mass ([place]) 2018   Urology fully evaluated; benign   Allergy    hay fever   Anemia    resolved spontaneously   Anxiety    Cataract    Cervicalgia    Complication of anesthesia    hypoglycemia, low blood pressure, pt. reports it dropped to zero- post op MORPHINE , then taken ICU due to either the morphine or hypoglycemia . In addition post op from cataract surgery- 07/2015, experienced an episode of hypoglycemia     Depressive disorder, not elsewhere classified    Diverticulosis    Elevated prostate specific antigen (PSA) 2008   Dr Jeffie Pollock   Fecal occult blood test positive 02/07/2020   Gallstone    GERD (gastroesophageal reflux disease)    H/O cardiovascular stress test >10 yrs.   told that its wnl. Told to drink less coffee   Hematuria 2018   Hematuria with enlarged prostate, kidney cyst, adrenal adenoma--> followed by urology with cystoscopy and repeat CT, benign workup.   Hx of migraines    Hypertension    Hyperthyroidism    Treated with medication no longer needed at this time   Hypoglycemia    IBS (irritable bowel syndrome)    patient not aware   Inguinal hernia 12/2015   Bilateral small indirect hernias on CT, umbilical hernia also present.   Kidney cyst, acquired 2018   Followed by urology, benign.   Low back pain    Macular degeneration (senile) of retina, unspecified    2 different opinions from 2  different Ophth   Osteoarthrosis, unspecified whether generalized or localized, unspecified site    Other and unspecified hyperlipidemia    Prostatitis 2008   Rectal cancer ([place])    Refusal of blood transfusions as patient is Jehovah's Witness    Segmental and somatic dysfunction of cervical region    Segmental and somatic dysfunction of lumbar region    Segmental and somatic dysfunction of sacral region    Segmental and somatic dysfunction of thoracic region    Spondylosis of lumbar spine    Strain of pelvis    Tuberculosis    had exposure as child tests positive on skin test.    Past Surgical History:  Procedure Laterality Date   BIOPSY  05/24/2020   Procedure: BIOPSY;  Surgeon: Irving Copas., MD;  Location: [facility] ENDOSCOPY;  Service: Gastroenterology;;   CATARACT EXTRACTION, BILATERAL     COLONOSCOPY  2012   Dr Olevia Perches   COLONOSCOPY WITH PROPOFOL N/A 05/24/2020   Procedure: COLONOSCOPY WITH PROPOFOL;  Surgeon: Irving Copas., MD;  Location: [place];  Service: Gastroenterology;  Laterality: N/A;   CYSTOSCOPY     for hematuria   EUS N/A 05/24/2020   Procedure: LOWER ENDOSCOPIC ULTRASOUND (EUS);  Surgeon: Irving Copas., MD;  Location: [place];  Service: Gastroenterology;  Laterality: N/A;   hydrocoelectomy  2003  Dr Jeffie Pollock   KNEE ARTHROSCOPY Left 1994   OSTOMY N/A 06/21/2020   Procedure: POSSIBLE OSTOMY;  Surgeon: Michael Boston, MD;  Location: [facility];  Service: General;  Laterality: N/A;   POLYPECTOMY  05/24/2020   Procedure: POLYPECTOMY;  Surgeon: Irving Copas., MD;  Location: [facility] ENDOSCOPY;  Service: Gastroenterology;;   PROCTOSCOPY N/A 06/21/2020   Procedure: RIGID PROCTOSCOPY;  Surgeon: Michael Boston, MD;  Location: [facility];  Service: General;  Laterality: N/A;   prostate nodule resection  2003   TONSILLECTOMY     TOTAL KNEE ARTHROPLASTY  2009   TOTAL KNEE ARTHROPLASTY Right 06/27/2016   Procedure: TOTAL KNEE ARTHROPLASTY;   Surgeon: Leandrew Koyanagi, MD;  Location: [place];  Service: Orthopedics;  Laterality: Right;   XI ROBOTIC ASSISTED LOWER ANTERIOR RESECTION N/A 06/21/2020   Procedure: XI ROBOTIC ASSISTED LOWER ANTERIOR RECTOSIGMOID RESECTION, BILATERAL TAP BLOCK;  Surgeon: Michael Boston, MD;  Location: [facility];  Service: General;  Laterality: N/A;    Prior to Admission medications   Medication Sig Start Date End Date Taking? Authorizing Provider  amLODipine (NORVASC) 10 MG tablet Take 1 tablet (10 mg total) by mouth daily. 04/12/21  Yes Kuneff, Renee A, DO  b complex vitamins tablet Take 1 tablet by mouth every other day.   Yes [provider]  lisinopril (ZESTRIL) 10 MG tablet Take 1 tablet (10 mg total) by mouth daily. 04/12/21  Yes Kuneff, Renee A, DO  Multiple Vitamin tablet Take 1 tablet by mouth daily.    Yes [provider]    Current Outpatient Medications  Medication Sig Dispense Refill   amLODipine (NORVASC) 10 MG tablet Take 1 tablet (10 mg total) by mouth daily. 90 tablet 1   b complex vitamins tablet Take 1 tablet by mouth every other day.     lisinopril (ZESTRIL) 10 MG tablet Take 1 tablet (10 mg total) by mouth daily. 90 tablet 1   Multiple Vitamin tablet Take 1 tablet by mouth daily.      Current Facility-Administered Medications  Medication Dose Route Frequency Provider Last Rate Last Admin   0.9 %  sodium chloride infusion  500 mL Intravenous Once Meagen Limones, Carlota Raspberry, MD        Allergies as of 08/06/2021 - Review Complete 08/06/2021  Allergen Reaction Noted   Morphine and related Other (See Comments) 06/18/2011   Citalopram hydrobromide Other (See Comments) 06/29/2007   Sertraline hcl Other (See Comments) 06/29/2007    Family History  Problem Relation Age of Onset   Heart disease Mother    Lung cancer Father        smoker   Alcohol abuse Father    Tuberculosis Father    Heart disease Father    Early death Father    Heart attack Brother    Diabetes Other     Stroke Neg Hx    Thyroid disease Neg Hx    Colon cancer Neg Hx    Stomach cancer Neg Hx    Rectal cancer Neg Hx    Esophageal cancer Neg Hx    Inflammatory bowel disease Neg Hx    Liver disease Neg Hx    Pancreatic cancer Neg Hx    Colon polyps Neg Hx     Social History   Socioeconomic History   Marital status: Married    Spouse name: Not on file   Number of children: 4   Years of education: Not on file   Highest education level: Not on file  Occupational History   Not on file  Tobacco Use   Smoking status: Former    Types: Cigarettes    Quit date: 07/15/1968    Years since quitting: 53.0   Smokeless tobacco: Never   Tobacco comments:    smoked [place], up to 1ppd  Vaping Use   Vaping Use: Never used  Substance and Sexual Activity   Alcohol use: Not Currently    Comment: on occasion   Drug use: No   Sexual activity: Yes    Partners: Female    Birth control/protection: None  Other Topics Concern   Not on file  Social History Narrative   Married. Wife's name is [name]. Full time employed, Pensions consultant.   Drinks caffeinated beverages. Take a multivitamin. Wears his seatbelt.   Exercises at least 3 times a week. Is not a strict vegetarian, but only eats meat 1-2 times a week   Wears a hearing aid. Does not wear dentures or use an assistive walking device.   There is a smoke detector in his home.   He feels safe in his relationships.   Social Determinants of Health   Financial Resource Strain: Low Risk    Difficulty of Paying Living Expenses: Not hard at all  Food Insecurity: No Food Insecurity   Worried About Charity fundraiser in the Last Year: Never true   [employer] in the Last Year: Never true  Transportation Needs: No Transportation Needs   Lack of Transportation (Medical): No   Lack of Transportation (Non-Medical): No  Physical Activity: Sufficiently Active   Days of Exercise per Week: 4 days   Minutes of Exercise per Session: 40  min  Stress: No Stress Concern Present   Feeling of Stress : Not at all  Social Connections: Moderately Integrated   Frequency of Communication with Friends and Family: More than three times a week   Frequency of Social Gatherings with Friends and Family: Once a week   Attends Religious Services: More than 4 times per year   Active Member of [employer] or Organizations: No   Attends Archivist Meetings: Never   Marital Status: Married  Human resources officer Violence: Not At Risk   Fear of Current or Ex-Partner: No   Emotionally Abused: No   Physically Abused: No   Sexually Abused: No    Review of Systems: All other review of systems negative except as mentioned in the HPI.  Physical Exam: Vital signs BP (!) 145/81    Pulse 70    Temp 97.8 F (36.6 C) (Temporal)    Resp 17    Ht 5\' 8"  (1.727 m)    Wt 185 lb (83.9 kg)    SpO2 99%    BMI 28.13 kg/m   General:   Alert,  Well-developed, pleasant and cooperative in NAD Lungs:  Clear throughout to auscultation.   Heart:  Regular rate and rhythm Abdomen:  Soft, nontender and nondistended.   Neuro/Psych:  Alert and cooperative. Normal mood and affect. A and O x 3  Jolly Mango, MD [facility]  '

## 2021-08-06 NOTE — Progress Notes (Signed)
Pt's states no medical or surgical changes since previsit or office visit.  VS CW  

## 2021-08-06 NOTE — Progress Notes (Signed)
Report given to PACU, vss 

## 2021-08-08 ENCOUNTER — Telehealth: Payer: Self-pay | Admitting: *Deleted

## 2021-08-08 NOTE — Telephone Encounter (Signed)
°  Follow up Call-  Call back number 08/06/2021 04/05/2020  Post procedure Call Back phone  # [phone]  Permission to leave phone message Yes Yes  Some recent data might be hidden     Patient questions: Message left to call us if necessary.

## 2021-09-21 ENCOUNTER — Telehealth: Payer: Self-pay | Admitting: Family Medicine

## 2021-09-21 NOTE — Telephone Encounter (Signed)
Spoke with patient he requested a call back the week of 10/01/21 ?

## 2021-09-24 ENCOUNTER — Telehealth: Payer: Self-pay | Admitting: Family Medicine

## 2021-09-24 NOTE — Telephone Encounter (Signed)
Left message for patient to schedule Annual Wellness Visit.  Please schedule (telephone/video call) with Nurse Health Advisor Tina Betterson, RN at [facility]. Please call [phone] ask for Kathy 

## 2021-10-24 ENCOUNTER — Ambulatory Visit (INDEPENDENT_AMBULATORY_CARE_PROVIDER_SITE_OTHER): Payer: Medicare Other

## 2021-10-24 DIAGNOSIS — Z0000 Encounter for general adult medical examination without abnormal findings: Secondary | ICD-10-CM | POA: Diagnosis not present

## 2021-10-24 NOTE — Patient Instructions (Signed)
Mr. Karner , ?Thank you for taking time to come for your Medicare Wellness Visit. I appreciate your ongoing commitment to your health goals. Please review the following plan we discussed and let me know if I can assist you in the future.  ? ?Screening recommendations/referrals: ?Colonoscopy: Done 08/06/21 repeat every 3 years  ?Recommended yearly ophthalmology/optometry visit for glaucoma screening and checkup ?Recommended yearly dental visit for hygiene and checkup ? ?Vaccinations: ?Influenza vaccine: declined  ?Pneumococcal vaccine: Declined  ?Tdap vaccine: Done 07/15/12 repeat every 10 years  ?Shingles vaccine: Shingrix discussed. Please contact your pharmacy for coverage information.    ?Covid-19: Completed 3/8, 4/7, 07/03/20 ? ?Advanced directives: Copies in chart  ? ?Conditions/risks identified: Lose weight  ? ?Next appointment: Follow up in one year for your annual wellness visit.  ? ?Preventive Care 76 Years and Older, Male ?Preventive care refers to lifestyle choices and visits with your health care provider that can promote health and wellness. ?What does preventive care include? ?A yearly physical exam. This is also called an annual well check. ?Dental exams once or twice a year. ?Routine eye exams. Ask your health care provider how often you should have your eyes checked. ?Personal lifestyle choices, including: ?Daily care of your teeth and gums. ?Regular physical activity. ?Eating a healthy diet. ?Avoiding tobacco and drug use. ?Limiting alcohol use. ?Practicing safe sex. ?Taking low doses of aspirin every day. ?Taking vitamin and mineral supplements as recommended by your health care provider. ?What happens during an annual well check? ?The services and screenings done by your health care provider during your annual well check will depend on your age, overall health, lifestyle risk factors, and family history of disease. ?Counseling  ?Your health care provider may ask you questions about your: ?Alcohol  use. ?Tobacco use. ?Drug use. ?Emotional well-being. ?Home and relationship well-being. ?Sexual activity. ?Eating habits. ?History of falls. ?Memory and ability to understand (cognition). ?Work and work Statistician. ?Screening  ?You may have the following tests or measurements: ?Height, weight, and BMI. ?Blood pressure. ?Lipid and cholesterol levels. These may be checked every 5 years, or more frequently if you are over 69 years old. ?Skin check. ?Lung cancer screening. You may have this screening every year starting at age 15 if you have a 30-pack-year history of smoking and currently smoke or have quit within the past 15 years. ?Fecal occult blood test (FOBT) of the stool. You may have this test every year starting at age 50. ?Flexible sigmoidoscopy or colonoscopy. You may have a sigmoidoscopy every 5 years or a colonoscopy every 10 years starting at age 31. ?Prostate cancer screening. Recommendations will vary depending on your family history and other risks. ?Hepatitis C blood test. ?Hepatitis B blood test. ?Sexually transmitted disease (STD) testing. ?Diabetes screening. This is done by checking your blood sugar (glucose) after you have not eaten for a while (fasting). You may have this done every 1-3 years. ?Abdominal aortic aneurysm (AAA) screening. You may need this if you are a current or former smoker. ?Osteoporosis. You may be screened starting at age 19 if you are at high risk. ?Talk with your health care provider about your test results, treatment options, and if necessary, the need for more tests. ?Vaccines  ?Your health care provider may recommend certain vaccines, such as: ?Influenza vaccine. This is recommended every year. ?Tetanus, diphtheria, and acellular pertussis (Tdap, Td) vaccine. You may need a Td booster every 10 years. ?Zoster vaccine. You may need this after age 40. ?Pneumococcal 13-valent conjugate (PCV13)  vaccine. One dose is recommended after age 35. ?Pneumococcal polysaccharide  (PPSV23) vaccine. One dose is recommended after age 51. ?Talk to your health care provider about which screenings and vaccines you need and how often you need them. ?This information is not intended to replace advice given to you by your health care provider. Make sure you discuss any questions you have with your health care provider. ?Document Released: 07/28/2015 Document Revised: 03/20/2016 Document Reviewed: 05/02/2015 ?Elsevier Interactive Patient Education ? 2017 [place]. ? ?Fall Prevention in the Home ?Falls can cause injuries. They can happen to people of all ages. There are many things you can do to make your home safe and to help prevent falls. ?What can I do on the outside of my home? ?Regularly fix the edges of walkways and driveways and fix any cracks. ?Remove anything that might make you trip as you walk through a door, such as a raised step or threshold. ?Trim any bushes or trees on the path to your home. ?Use bright outdoor lighting. ?Clear any walking paths of anything that might make someone trip, such as rocks or tools. ?Regularly check to see if handrails are loose or broken. Make sure that both sides of any steps have handrails. ?Any raised decks and porches should have guardrails on the edges. ?Have any leaves, snow, or ice cleared regularly. ?Use sand or salt on walking paths during winter. ?Clean up any spills in your garage right away. This includes oil or grease spills. ?What can I do in the bathroom? ?Use night lights. ?Install grab bars by the toilet and in the tub and shower. Do not use towel bars as grab bars. ?Use non-skid mats or decals in the tub or shower. ?If you need to sit down in the shower, use a plastic, non-slip stool. ?Keep the floor dry. Clean up any water that spills on the floor as soon as it happens. ?Remove soap buildup in the tub or shower regularly. ?Attach bath mats securely with double-sided non-slip rug tape. ?Do not have throw rugs and other things on the  floor that can make you trip. ?What can I do in the bedroom? ?Use night lights. ?Make sure that you have a light by your bed that is easy to reach. ?Do not use any sheets or blankets that are too big for your bed. They should not hang down onto the floor. ?Have a firm chair that has side arms. You can use this for support while you get dressed. ?Do not have throw rugs and other things on the floor that can make you trip. ?What can I do in the kitchen? ?Clean up any spills right away. ?Avoid walking on wet floors. ?Keep items that you use a lot in easy-to-reach places. ?If you need to reach something above you, use a strong step stool that has a grab bar. ?Keep electrical cords out of the way. ?Do not use floor polish or wax that makes floors slippery. If you must use wax, use non-skid floor wax. ?Do not have throw rugs and other things on the floor that can make you trip. ?What can I do with my stairs? ?Do not leave any items on the stairs. ?Make sure that there are handrails on both sides of the stairs and use them. Fix handrails that are broken or loose. Make sure that handrails are as long as the stairways. ?Check any carpeting to make sure that it is firmly attached to the stairs. Fix any carpet that is loose  or worn. ?Avoid having throw rugs at the top or bottom of the stairs. If you do have throw rugs, attach them to the floor with carpet tape. ?Make sure that you have a light switch at the top of the stairs and the bottom of the stairs. If you do not have them, ask someone to add them for you. ?What else can I do to help prevent falls? ?Wear shoes that: ?Do not have high heels. ?Have rubber bottoms. ?Are comfortable and fit you well. ?Are closed at the toe. Do not wear sandals. ?If you use a stepladder: ?Make sure that it is fully opened. Do not climb a closed stepladder. ?Make sure that both sides of the stepladder are locked into place. ?Ask someone to hold it for you, if possible. ?Clearly mark and make  sure that you can see: ?Any grab bars or handrails. ?First and last steps. ?Where the edge of each step is. ?Use tools that help you move around (mobility aids) if they are needed. These include: ?Canes. ?Walkers.

## 2021-10-24 NOTE — Progress Notes (Addendum)
Virtual Visit via Telephone Note ? ?I connected with  Nathan [name] on 10/24/21 at  8:15 AM EDT by telephone and verified that I am speaking with the correct person using two identifiers. ? ?Medicare Annual Wellness visit completed telephonically due to Covid-19 pandemic.  ? ?Persons participating in this call: This Health Coach and this patient.  ? ?Location: ?Patient: Home ?Provider: Office  ?  ?I discussed the limitations, risks, security and privacy concerns of performing an evaluation and management service by telephone and the availability of in person appointments. The patient expressed understanding and agreed to proceed. ? ?Unable to perform video visit due to video visit attempted and failed and/or patient does not have video capability.  ? ?Some vital signs may be absent or patient reported.  ? ?Willette Brace, LPN ? ? ?Subjective:  ? Nathan [name] is a 76 y.o. male who presents for Medicare Annual/Subsequent preventive examination. ? ?Review of Systems    ? ?Cardiac Risk Factors include: advanced age (>47mn, >>2women);dyslipidemia;male gender;hypertension ? ?   ?Objective:  ?  ?There were no vitals filed for this visit. ?There is no height or weight on file to calculate BMI. ? ? ?  10/24/2021  ?  8:22 AM 09/20/2020  ?  9:09 AM 06/20/2020  ? 10:45 AM 06/12/2020  ?  2:08 PM 05/24/2020  ?  9:09 AM 02/03/2019  ?  8:19 PM 09/30/2018  ? 10:05 PM  ?Advanced Directives  ?Does Patient Have a Medical Advance Directive? Yes Yes Yes Yes Yes No No  ?Type of AIndustrial/product designerof APr[place]Living will [place] will Living will    ?Does patient want to make changes to medical advance directive?   No - Patient declined No - Patient declined     ?Copy of [place] Chart? Yes - validated most recent copy scanned in chart (See row information) Yes - validated most recent copy scanned in chart (See row  information) No - copy requested Yes - validated most recent copy scanned in chart (See row information)     ?Would patient like information on creating a medical advance directive?      No - Patient declined No - Patient declined  ? ? ?Current Medications (verified) ?Outpatient Encounter Medications as of 10/24/2021  ?Medication Sig  ? amLODipine (NORVASC) 10 MG tablet Take 1 tablet (10 mg total) by mouth daily.  ? b complex vitamins tablet Take 1 tablet by mouth every other day.  ? lisinopril (ZESTRIL) 10 MG tablet Take 1 tablet (10 mg total) by mouth daily.  ? Multiple Vitamin tablet Take 1 tablet by mouth daily.   ? ?No facility-administered encounter medications on file as of 10/24/2021.  ? ? ?Allergies (verified) ?Morphine and related, Citalopram hydrobromide, and Sertraline hcl  ? ?History: ?Past Medical History:  ?Diagnosis Date  ? [place]al [place]  ? Urology fully evaluated; benign  ? Allergy   ? hay fever  ? Anemia   ? resolved spontaneously  ? Anxiety   ? Cataract   ? Cervicalgia   ? Complication of anesthesia   ? hypoglycemia, low blood pressure, pt. reports it dropped to zero- post op MORPHINE , then taken ICU due to either the morphine or hypoglycemia . In addition post op from cataract surgery- 07/2015, experienced an episode of hypoglycemia    ? Depressive disorder, not elsewhere classified   ? Diverticulosis   ?  Elevated prostate specific antigen (PSA) 2008  ? Dr Jeffie Pollock  ? Fecal occult blood test positive 02/07/2020  ? Gallstone   ? GERD (gastroesophageal reflux disease)   ? H/O cardiovascular stress test >10 yrs.  ? told that its wnl. Told to drink less coffee  ? Hematuria 2018  ? Hematuria with enlarged prostate, kidney cyst, adrenal adenoma--> followed by urology with cystoscopy and repeat CT, benign workup.  ? Hx of migraines   ? Hypertension   ? Hyperthyroidism   ? Treated with medication no longer needed at this time  ? Hypoglycemia   ? IBS (irritable bowel syndrome)   ? patient not aware  ?  Inguinal hernia 12/2015  ? Bilateral small indirect hernias on CT, umbilical hernia also present.  ? Kidney cyst, acquired 2018  ? Followed by urology, benign.  ? Low back pain   ? Macular degeneration (senile) of retina, unspecified   ? 2 different opinions from 2 different Ophth  ? Osteoarthrosis, unspecified whether generalized or localized, unspecified site   ? Other and unspecified hyperlipidemia   ? Prostatitis 2008  ? Rectal cancer ([place])   ? Refusal of blood transfusions as patient is Jehovah's Witness   ? Segmental and somatic dysfunction of cervical region   ? Segmental and somatic dysfunction of lumbar region   ? Segmental and somatic dysfunction of sacral region   ? Segmental and somatic dysfunction of thoracic region   ? Spondylosis of lumbar spine   ? Strain of pelvis   ? Tuberculosis   ? had exposure as child tests positive on skin test.  ? ?Past Surgical History:  ?Procedure Laterality Date  ? BIOPSY  05/24/2020  ? Procedure: BIOPSY;  Surgeon: Irving Copas., MD;  Location: [place];  Service: Gastroenterology;;  ? CATARACT EXTRACTION, BILATERAL    ? COLONOSCOPY  2012  ? Dr Olevia Perches  ? COLONOSCOPY WITH PROPOFOL N/A 05/24/2020  ? Procedure: COLONOSCOPY WITH PROPOFOL;  Surgeon: Mansouraty, Telford Nab., MD;  Location: [place];  Service: Gastroenterology;  Laterality: N/A;  ? CYSTOSCOPY    ? for hematuria  ? EUS N/A 05/24/2020  ? Procedure: LOWER ENDOSCOPIC ULTRASOUND (EUS);  Surgeon: Irving Copas., MD;  Location: [place];  Service: Gastroenterology;  Laterality: N/A;  ? hydrocoelectomy  2003  ? Dr Jeffie Pollock  ? KNEE ARTHROSCOPY Left 1994  ? OSTOMY N/A 06/21/2020  ? Procedure: POSSIBLE OSTOMY;  Surgeon: Michael Boston, MD;  [facility];  Service: General;  Laterality: N/A;  ? POLYPECTOMY  05/24/2020  ? Procedure: POLYPECTOMY;  Surgeon: Irving Copas., MD;  Location: [place];  Service: Gastroenterology;;  ? PROCTOSCOPY N/A 06/21/2020  ? Procedure: RIGID  PROCTOSCOPY;  Surgeon: Michael Boston, MD;  [facility];  Service: General;  Laterality: N/A;  ? prostate nodule resection  2003  ? TONSILLECTOMY    ? TOTAL KNEE ARTHROPLASTY  2009  ? TOTAL KNEE ARTHROPLASTY Right 06/27/2016  ? Procedure: TOTAL KNEE ARTHROPLASTY;  Surgeon: Leandrew Koyanagi, MD;  Location: [place];  Service: Orthopedics;  Laterality: Right;  ? XI ROBOTIC ASSISTED LOWER ANTERIOR RESECTION N/A 06/21/2020  ? Procedure: XI ROBOTIC ASSISTED LOWER ANTERIOR RECTOSIGMOID RESECTION, BILATERAL TAP BLOCK;  Surgeon: Michael Boston, MD;  [facility];  Service: General;  Laterality: N/A;  ? ?Family History  ?Problem Relation Age of Onset  ? Heart disease Mother   ? Lung cancer Father   ?     smoker  ? Alcohol abuse Father   ? Tuberculosis  Father   ? Heart disease Father   ? Early death Father   ? Heart attack Brother   ? Diabetes Other   ? Stroke Neg Hx   ? Thyroid disease Neg Hx   ? Colon cancer Neg Hx   ? Stomach cancer Neg Hx   ? Rectal cancer Neg Hx   ? Esophageal cancer Neg Hx   ? Inflammatory bowel disease Neg Hx   ? Liver disease Neg Hx   ? Pancreatic cancer Neg Hx   ? Colon polyps Neg Hx   ? ?Social History  ? ?Socioeconomic History  ? Marital status: Married  ?  Spouse name: Not on file  ? Number of children: 4  ? Years of education: Not on file  ? Highest education level: Not on file  ?Occupational History  ? Not on file  ?Tobacco Use  ? Smoking status: Former  ?  Types: Cigarettes  ?  Quit date: 07/15/1968  ?  Years since quitting: 53.3  ? Smokeless tobacco: Never  ? Tobacco comments:  ?  smoked 1963 - 1970, up to 1ppd  ?Vaping Use  ? Vaping Use: Never used  ?Substance and Sexual Activity  ? Alcohol use: Yes  ?  Comment: on occasion  ? Drug use: No  ? Sexual activity: Yes  ?  Partners: Female  ?  Birth control/protection: None  ?Other Topics Concern  ? Not on file  ?Social History Narrative  ? Married. Wife's name is [name]. Full time employed, Pensions consultant.  ? Drinks caffeinated beverages.  Take a multivitamin. Wears his seatbelt.  ? Exercises at least 3 times a week. Is not a strict vegetarian, but only eats meat 1-2 times a week  ? Wears a hearing aid. Does not wear dentures or use an assistive

## 2021-11-22 ENCOUNTER — Other Ambulatory Visit: Payer: Self-pay | Admitting: Family Medicine

## 2021-12-11 ENCOUNTER — Other Ambulatory Visit: Payer: Self-pay

## 2021-12-18 ENCOUNTER — Ambulatory Visit (INDEPENDENT_AMBULATORY_CARE_PROVIDER_SITE_OTHER): Payer: Medicare Other | Admitting: Family Medicine

## 2021-12-18 ENCOUNTER — Telehealth: Payer: Self-pay | Admitting: Family Medicine

## 2021-12-18 ENCOUNTER — Encounter: Payer: Self-pay | Admitting: Family Medicine

## 2021-12-18 VITALS — BP 126/66 | HR 63 | Temp 98.1°F | Ht 68.0 in | Wt 189.0 lb

## 2021-12-18 DIAGNOSIS — Z2821 Immunization not carried out because of patient refusal: Secondary | ICD-10-CM

## 2021-12-18 DIAGNOSIS — E782 Mixed hyperlipidemia: Secondary | ICD-10-CM | POA: Diagnosis not present

## 2021-12-18 DIAGNOSIS — I700 Atherosclerosis of aorta: Secondary | ICD-10-CM

## 2021-12-18 DIAGNOSIS — I10 Essential (primary) hypertension: Secondary | ICD-10-CM | POA: Diagnosis not present

## 2021-12-18 DIAGNOSIS — E663 Overweight: Secondary | ICD-10-CM

## 2021-12-18 DIAGNOSIS — R5383 Other fatigue: Secondary | ICD-10-CM

## 2021-12-18 DIAGNOSIS — I6521 Occlusion and stenosis of right carotid artery: Secondary | ICD-10-CM

## 2021-12-18 DIAGNOSIS — Z5320 Procedure and treatment not carried out because of patient's decision for unspecified reasons: Secondary | ICD-10-CM

## 2021-12-18 LAB — CBC WITH DIFFERENTIAL/PLATELET
Basophils Absolute: 0 10*3/uL (ref 0.0–0.1)
Basophils Relative: 0.6 % (ref 0.0–3.0)
Eosinophils Absolute: 0.1 10*3/uL (ref 0.0–0.7)
Eosinophils Relative: 1.7 % (ref 0.0–5.0)
HCT: 43.9 % (ref 39.0–52.0)
Hemoglobin: 14.5 g/dL (ref 13.0–17.0)
Lymphocytes Relative: 26.6 % (ref 12.0–46.0)
Lymphs Abs: 2.2 10*3/uL (ref 0.7–4.0)
MCHC: 33.1 g/dL (ref 30.0–36.0)
MCV: 90.8 fl (ref 78.0–100.0)
Monocytes Absolute: 0.7 10*3/uL (ref 0.1–1.0)
Monocytes Relative: 8 % (ref 3.0–12.0)
Neutro Abs: 5.3 10*3/uL (ref 1.4–7.7)
Neutrophils Relative %: 63.1 % (ref 43.0–77.0)
Platelets: 256 10*3/uL (ref 150.0–400.0)
RBC: 4.84 Mil/uL (ref 4.22–5.81)
RDW: 14.6 % (ref 11.5–15.5)
WBC: 8.4 10*3/uL (ref 4.0–10.5)

## 2021-12-18 LAB — COMPREHENSIVE METABOLIC PANEL
ALT: 12 U/L (ref 0–53)
AST: 16 U/L (ref 0–37)
Albumin: 4.1 g/dL (ref 3.5–5.2)
Alkaline Phosphatase: 68 U/L (ref 39–117)
BUN: 17 mg/dL (ref 6–23)
CO2: 25 mEq/L (ref 19–32)
Calcium: 9.4 mg/dL (ref 8.4–10.5)
Chloride: 104 mEq/L (ref 96–112)
Creatinine, Ser: 0.94 mg/dL (ref 0.40–1.50)
GFR: 79.03 mL/min (ref >60.00)
Glucose, Bld: 86 mg/dL (ref 70–99)
Potassium: 3.7 mEq/L (ref 3.5–5.1)
Sodium: 140 mEq/L (ref 135–145)
Total Bilirubin: 0.8 mg/dL (ref 0.2–1.2)
Total Protein: 6.5 g/dL (ref 6.0–8.3)

## 2021-12-18 LAB — IBC + FERRITIN
Ferritin: 25.9 ng/mL (ref 22.0–322.0)
Iron: 141 ug/dL (ref 42–165)
Saturation Ratios: 32.2 % (ref 20.0–50.0)
TIBC: 438.2 ug/dL (ref 250.0–450.0)
Transferrin: 313 mg/dL (ref 212.0–360.0)

## 2021-12-18 LAB — LDL CHOLESTEROL, DIRECT: Direct LDL: 192 mg/dL

## 2021-12-18 LAB — TSH: TSH: 1.83 u[IU]/mL (ref 0.35–5.50)

## 2021-12-18 MED ORDER — LISINOPRIL 10 MG PO TABS: 10.0000 mg | ORAL_TABLET | Freq: Every day | ORAL | 1 refills | Status: DC

## 2021-12-18 MED ORDER — AMLODIPINE BESYLATE 10 MG PO TABS: 10.0000 mg | ORAL_TABLET | Freq: Every day | ORAL | 1 refills | Status: DC

## 2021-12-18 NOTE — Progress Notes (Signed)
Patient ID: Nathan [name], male  DOB: Jul 22, 1945, 76 y.o.   MRN: [id number] Patient Care Team    Relationship Specialty Notifications Start End  Ma Hillock, DO PCP - General Family Medicine  12/07/15   Pa, [facility]    06/02/17   Leandrew Koyanagi, MD Attending Physician Orthopedic Surgery  06/02/17   Ladell Pier, MD Consulting Physician Oncology  05/29/20   Michael Boston, MD Consulting Physician General Surgery  06/19/20   Richardo Priest, MD Consulting Physician Cardiology  06/19/20   Armbruster, Carlota Raspberry, MD Consulting Physician Gastroenterology  06/26/20     Chief Complaint  Patient presents with   Hypertension    [employer]; pt is not fasting    Subjective: Nathan [name] is a 76 y.o. male present for [facility]. All past medical history, surgical history, allergies, family history, immunizations, medications and social history were updated in the electronic medical record today. All recent labs, ED visits and hospitalizations within the last year were reviewed.  Hypertension/atheroscolerosis of aorta/statin declined: Pt reports compliance  amlodipine 10 mg daily and Lisinopril 10 mg qd. Patient denies chest pain, shortness of breath, dizziness or lower extremity edema.  He does endorse increased fatigue.  He notices fatigue with activity.  Feeling more winded when climbing stairs etc.  he switched to a plant based diet last year He has refused statin medication-cholesterol is extremely high with an LDL of 224. Exercise: Has started to exercise routinely. Using stationary bike 6 times a week for approximately 20-30 minutes. RF: hypertension, hyperlipidemia, pulm former smoker, fhx heart disease, BMI>25   Patient has a history of right carotid artery stenosis. 01/26/2019 carotid vascular ultrasound: IMPRESSION: 1. Moderate (50-69%) stenosis proximal right internal carotid artery secondary to heterogeneous and mildly irregular atherosclerotic plaque. 2. Mild  (1-49%) stenosis proximal left internal carotid artery secondary to heterogeneous and slightly irregular atherosclerotic plaque. 3. Flow reversal in the left vertebral artery consistent with subclavian steal in the setting of a hemodynamically significant stenosis or occlusion of the proximal subclavian artery. 4. Normal antegrade flow in the right vertebral artery  Echo 02/11/2019:  1. The left ventricle has normal systolic function, with an ejection  fraction of 60-65%. The cavity size was normal. There is mildly increased  left ventricular wall thickness. Left ventricular diastolic parameters  were normal.   2. The right ventricle has normal systolc function. The cavity was mildly  enlarged. There is no increase in right ventricular wall thickness. Right  ventricular systolic pressure is normal with an estimated pressure of 28.0  mmHg.   3. Aortic valve regurgitation was not assessed by color flow Doppler.   4. Pulmonic valve regurgitation was not assessed by color flow Doppler.   02/16/2019-myocardial perfusion scan: The left ventricular ejection fraction is normal (55-65%). Nuclear stress EF: 55%. There was no ST segment deviation noted during stress. Defect 1: There is a small defect of mild severity present in the apex location. The study is normal. This is a low risk study.   Normal stress nuclear study with mild apical thinning but no ischemia.  Gated ejection fraction 55% with normal wall motion.    10/24/2021    8:20 AM 04/12/2021    8:30 AM 09/20/2020    9:13 AM 04/20/2018    9:40 AM 06/02/2017    8:48 AM  Depression screen PHQ 2/9  Decreased Interest 0 0 0 0 0  Down, Depressed, Hopeless 1 0 0 0 0  PHQ - 2 Score 1 0 0 0 0  Altered sleeping    0 0  Tired, decreased energy    1 0  Change in appetite    0 0  Feeling bad or failure about yourself     0 0  Trouble concentrating    0 0  Moving slowly or fidgety/restless    0 0  Suicidal thoughts    0 0  PHQ-9 Score    1 0   Difficult doing work/chores    Not difficult at all Not difficult at all      08/24/2015    2:57 PM  GAD 7 : Generalized Anxiety Score  Nervous, Anxious, on Edge 2  Control/stop worrying 3  Worry too much - different things 3  Trouble relaxing 1  Restless 0  Easily annoyed or irritable 2  Afraid - awful might happen 0  Total GAD 7 Score 11  Anxiety Difficulty Somewhat difficult        10/24/2021    8:23 AM 04/12/2021    8:30 AM 09/20/2020    9:11 AM 02/04/2020   10:04 AM 04/20/2018    9:40 AM  Fall Risk   Falls in the past year? 0 0 1 1 No  Number falls in past yr: 0 0 0 0   Injury with Fall? 0 0 0 0   Risk for fall due to : Impaired balance/gait  History of fall(s)    Risk for fall due to: Comment not like it used to be      Follow up Falls prevention discussed Falls evaluation completed Falls prevention discussed     Immunization History  Administered Date(s) Administered   PFIZER(Purple Top)SARS-COV-2 Vaccination 09/20/2019, 10/20/2019, 07/03/2020   PPD Test 03/17/2013   Tdap 07/15/2012   Zoster, Live 02/06/2015   Past Medical History:  Diagnosis Date   Adrenal mass ([place]) 2018   Urology fully evaluated; benign   Allergy    hay fever   Anemia    resolved spontaneously   Anxiety    Cataract    Cervicalgia    Complication of anesthesia    hypoglycemia, low blood pressure, pt. reports it dropped to zero- post op MORPHINE , then taken ICU due to either the morphine or hypoglycemia . In addition post op from cataract surgery- 07/2015, experienced an episode of hypoglycemia     Depressive disorder, not elsewhere classified    Diverticulosis    Elevated prostate specific antigen (PSA) 2008   Dr Jeffie Pollock   Fecal occult blood test positive 02/07/2020   Gallstone    GERD (gastroesophageal reflux disease)    H/O cardiovascular stress test >10 yrs.   told that its wnl. Told to drink less coffee   Hematuria 2018   Hematuria with enlarged prostate, kidney cyst, adrenal  adenoma--> followed by urology with cystoscopy and repeat CT, benign workup.   Hx of migraines    Hypertension    Hyperthyroidism    Treated with medication no longer needed at this time   Hypoglycemia    IBS (irritable bowel syndrome)    patient not aware   Inguinal hernia 12/2015   Bilateral small indirect hernias on CT, umbilical hernia also present.   Kidney cyst, acquired 2018   Followed by urology, benign.   Low back pain    Macular degeneration (senile) of retina, unspecified    2 different opinions from 2 different Ophth   Osteoarthrosis, unspecified whether generalized or localized, unspecified site  Other and unspecified hyperlipidemia    Prostatitis 2008   Rectal cancer ([place])    Refusal of blood transfusions as patient is Jehovah's Witness    Segmental and somatic dysfunction of cervical region    Segmental and somatic dysfunction of lumbar region    Segmental and somatic dysfunction of sacral region    Segmental and somatic dysfunction of thoracic region    Spondylosis of lumbar spine    Strain of pelvis    Tuberculosis    had exposure as child tests positive on skin test.   Allergies  Allergen Reactions   Morphine And Related Other (See Comments)    Severe drop in blood pressure   Citalopram Hydrobromide Other (See Comments)    PATIENT PREFERENCE: "Just ineffective"   Sertraline Hcl Other (See Comments)    Headache   Past Surgical History:  Procedure Laterality Date   BIOPSY  05/24/2020   Procedure: BIOPSY;  Surgeon: Irving Copas., MD;  Location: [facility];  Service: Gastroenterology;;   CATARACT EXTRACTION, BILATERAL     COLONOSCOPY  2012   Dr Olevia Perches   COLONOSCOPY WITH PROPOFOL N/A 05/24/2020   Procedure: COLONOSCOPY WITH PROPOFOL;  Surgeon: Irving Copas., MD;  Location: [place];  Service: Gastroenterology;  Laterality: N/A;   CYSTOSCOPY     for hematuria   EUS N/A 05/24/2020   Procedure: LOWER ENDOSCOPIC ULTRASOUND (EUS);   Surgeon: Irving Copas., MD;  Location: [place];  Service: Gastroenterology;  Laterality: N/A;   hydrocoelectomy  2003   Dr Jeffie Pollock   KNEE ARTHROSCOPY Left 1994   OSTOMY N/A 06/21/2020   Procedure: POSSIBLE OSTOMY;  Surgeon: Michael Boston, MD;  [facility];  Service: General;  Laterality: N/A;   POLYPECTOMY  05/24/2020   Procedure: POLYPECTOMY;  Surgeon: Irving Copas., MD;  Location: [facility]OSCOPY;  Service: Gastroenterology;;   PROCTOSCOPY N/A 06/21/2020   Procedure: RIGID PROCTOSCOPY;  Surgeon: Michael Boston, MD;  [facility];  Service: General;  Laterality: N/A;   prostate nodule resection  2003   TONSILLECTOMY     TOTAL KNEE ARTHROPLASTY  2009   TOTAL KNEE ARTHROPLASTY Right 06/27/2016   Procedure: TOTAL KNEE ARTHROPLASTY;  Surgeon: Leandrew Koyanagi, MD;  Location: [place];  Service: Orthopedics;  Laterality: Right;   XI ROBOTIC ASSISTED LOWER ANTERIOR RESECTION N/A 06/21/2020   Procedure: XI ROBOTIC ASSISTED LOWER ANTERIOR RECTOSIGMOID RESECTION, BILATERAL TAP BLOCK;  Surgeon: Michael Boston, MD;  [facility];  Service: General;  Laterality: N/A;   Family History  Problem Relation Age of Onset   Heart disease Mother    Lung cancer Father        smoker   Alcohol abuse Father    Tuberculosis Father    Heart disease Father    Early death Father    Heart attack Brother    Diabetes Other    Stroke Neg Hx    Thyroid disease Neg Hx    Colon cancer Neg Hx    Stomach cancer Neg Hx    Rectal cancer Neg Hx    Esophageal cancer Neg Hx    Inflammatory bowel disease Neg Hx    Liver disease Neg Hx    Pancreatic cancer Neg Hx    Colon polyps Neg Hx    Social History   Social History Narrative   Married. Wife's name is [name]. Full time employed, Pensions consultant.   Drinks caffeinated beverages. Take a multivitamin. Wears his seatbelt.   Exercises at least  3 times a week. Is not a strict vegetarian, but only eats meat 1-2 times a week   Wears a  hearing aid. Does not wear dentures or use an assistive walking device.   There is a smoke detector in his home.   He feels safe in his relationships.    Allergies as of 12/18/2021       Reactions   Morphine And Related Other (See Comments)   Severe drop in blood pressure   Citalopram Hydrobromide Other (See Comments)   PATIENT PREFERENCE: "Just ineffective"   Sertraline Hcl Other (See Comments)   Headache        Medication List        Accurate as of December 18, 2021  2:40 PM. If you have any questions, ask your nurse or doctor.          amLODipine 10 MG tablet Commonly known as: NORVASC Take 1 tablet (10 mg total) by mouth daily.   b complex vitamins tablet Take 1 tablet by mouth every other day.   lisinopril 10 MG tablet Commonly known as: ZESTRIL Take 1 tablet (10 mg total) by mouth daily.   Multiple Vitamin tablet Take 1 tablet by mouth daily.       All past medical history, surgical history, allergies, family history, immunizations andmedications were updated in the EMR today and reviewed under the history and medication portions of their EMR.     No results found for this or any previous visit (from the past 2160 hour(s)).  No results found.  ROS: 14 pt review of systems performed and negative (unless mentioned in an HPI)  Objective: BP 126/66   Pulse 63   Temp 98.1 F (36.7 C) (Oral)   Ht _0  (1.727 m)   Wt 189 lb (85.7 kg)   SpO2 97%   BMI 28.74 kg/m  Physical Exam Vitals and nursing note reviewed.  Constitutional:      General: He is not in acute distress.    Appearance: Normal appearance. He is not ill-appearing, toxic-appearing or diaphoretic.  HENT:     Head: Normocephalic and atraumatic.     Mouth/Throat:     Mouth: Mucous membranes are moist.  Eyes:     General: No scleral icterus.       Right eye: No discharge.        Left eye: No discharge.     Extraocular Movements: Extraocular movements intact.     Pupils: Pupils are equal,  round, and reactive to light.  Cardiovascular:     Rate and Rhythm: Normal rate and regular rhythm.  Pulmonary:     Effort: Pulmonary effort is normal. No respiratory distress.     Breath sounds: Normal breath sounds. No wheezing, rhonchi or rales.  Musculoskeletal:     Cervical back: Neck supple.     Right lower leg: No edema.     Left lower leg: No edema.  Lymphadenopathy:     Cervical: No cervical adenopathy.  Skin:    General: Skin is warm and dry.     Coloration: Skin is not jaundiced or pale.     Findings: No rash.  Neurological:     Mental Status: He is alert and oriented to person, place, and time. Mental status is at baseline.  Psychiatric:        Mood and Affect: Mood normal.        Behavior: Behavior normal.        Thought Content: Thought content normal.  Judgment: Judgment normal.    No results found.  Assessment/plan: Nathan [name] is a 76 y.o. male present for [facility] Essential hypertension, benign/Atherosclerosis of aorta (HCC)/Overweight (BMI 25.0-29.9)/Mixed hyperlipidemia/Statin declined/Atherosclerosis of aorta (HCC) Hypertension is stable. Continue amlodipine 10 mg qd Continue lisinopril at 10 mg qd.  - statin declined.  -- CBC w/Diff - TSH - Comp Met (CMET) - IBC + Ferritin - CT CARDIAC SCORING (SELF PAY ONLY); Future   Stenosis of right carotid artery - CT CARDIAC SCORING (SELF PAY ONLY); Future - US Carotid Duplex Bilateral; Future   Immunization declined PNA20 and shingrix  fatigue We discussed causes of fatigue again.  This has been a consistent complaint from him over the years.  Each time there has been a potential different cause contributing to his fatigue.  He was diagnosed with anemia with rectal cancer.  He was diagnosed with transient hyperthyroidism.  He has a family history of heart disease with controlled hypertension and untreated hyperlipidemia.  Cannot rule out CAD as contributor to his fatigue.  He did have a reassuring  stress test in 2020. -Cardiac CT ordered today. - TSH - IBC + Ferritin -CBC    Return in about 24 weeks (around 06/04/2022) for Routine chronic condition follow-up.   Orders Placed This Encounter  Procedures   CT CARDIAC SCORING (SELF PAY ONLY)   US Carotid Duplex Bilateral   CBC w/Diff   TSH   Comp Met (CMET)   IBC + Ferritin   Direct LDL   Meds ordered this encounter  Medications   amLODipine (NORVASC) 10 MG tablet    Sig: Take 1 tablet (10 mg total) by mouth daily.    Dispense:  90 tablet    Refill:  1   lisinopril (ZESTRIL) 10 MG tablet    Sig: Take 1 tablet (10 mg total) by mouth daily.    Dispense:  90 tablet    Refill:  1   Referral Orders  No referral(s) requested today     Note is dictated utilizing voice recognition software. Although note has been proof read prior to signing, occasional typographical errors still can be missed. If any questions arise, please do not hesitate to call for verification.  Electronically signed by: Howard Pouch, DO [place]

## 2021-12-18 NOTE — Telephone Encounter (Signed)
Please call patient Nathan [name], Nathan [name] and thyroid function are normal Blood cell counts and electrolytes are normal Iron levels are normal.  His LDL/bad cholesterol is still significantly high at 192.  His LDL goal is about 100.  Again this is likely genetic for him.  He is eating a healthy diet and exercising.  In order to lower his cardiovascular risk we would need to start a medication to lower his cholesterol. If he is agreeable to start medication, I will call in Lipitor which is taken before bed nightly.  By American Heart Association criteria/calculation his risk for CV event such as a heart attack or stroke is approximately 30% in the next 10 years with the above cholesterol findings, age, gender and hypertension history.    Please advise of his decision.

## 2021-12-18 NOTE — Patient Instructions (Signed)
Coronary Artery Disease, Male Coronary artery disease (CAD) is a condition in which the arteries that lead to the heart (coronary arteries) become narrow or blocked. The narrowing or blockage can lead to decreased blood flow to the heart. Prolonged reduced blood flow can cause a heart attack (myocardial infarction, or MI). This condition may also be called coronary heart disease. CAD is the most common type of heart disease, and heart disease is the leading cause of death in men. It is important to understand what causes CAD and how it is treated. What are the causes? CAD is most often caused by atherosclerosis. This is the buildup of fat and cholesterol (plaque) on the inside of the arteries. Over time, the plaque may narrow or block the artery, reducing blood flow to the heart. Plaque can also become weak and break off within a coronary artery and cause a sudden blockage. Other less common causes of CAD include: A blood clot or a piece of another substance that blocks the flow of blood in a coronary artery (embolism). A tearing of the artery (spontaneous coronary artery dissection). An enlargement of an artery (aneurysm). Inflammation (vasculitis) in the artery wall. What increases the risk? The following factors may make you more likely to develop this condition: Age. Men older than 45 years are at a greater risk of CAD. Family history of CAD. High blood pressure (hypertension). Diabetes. High cholesterol levels. Obesity. Other risk factors include: Tobacco use. Excessive alcohol use. Lack of exercise. A diet high in saturated and trans fats, such as fried food and processed meat. What are the signs or symptoms? Many people do not have any symptoms during the early stages of CAD. As the condition progresses, symptoms may include: Chest pain (angina). The pain can: Feel like crushing or squeezing, or like a tightness, pressure, fullness, or heaviness in the chest. Last more than a few  minutes or can stop and recur. The pain tends to get worse with exercise or stress and to fade with rest. Pain in the arms, neck, jaw, ear, or back. Unexplained heartburn or indigestion. Shortness of breath. Nausea or vomiting. Sudden light-headedness. Sudden cold sweats. Fluttering or fast heartbeat (palpitations). How is this diagnosed? This condition is diagnosed based on: Your family and medical history. A physical exam. Tests. These may include: A test to check the electrical signals in your heart (electrocardiogram). Exercise stress test. This looks for signs of blockage when the heart is stressed with exercise, such as running on a treadmill. Pharmacologic stress test. This test looks for signs of blockage when the heart is being stressed with a medicine. Blood tests to check levels of cardiac enzymes such as troponin and creatine kinase. Coronary angiogram. This is a procedure to look at the coronary arteries to see if there is any blockage. During this test, a dye is injected into your arteries so they appear on an X-ray. Coronary artery CT scan. This scan helps detect calcium deposits in your coronary arteries. Calcium deposits are an indicator of CAD. A test that uses sound waves to take a picture of your heart (echocardiogram). How is this treated? This condition may be treated by: Healthy lifestyle changes to reduce risk factors. Medicines such as: Antiplatelet medicines such as clopidogrel or aspirin. These help to prevent blood clots. Nitroglycerin. Blood pressure medicines. Cholesterol-lowering medicine. Coronary angioplasty and stenting. During this procedure, a thin, flexible tube is inserted through a blood vessel and into a blocked artery. A balloon or similar device on the   end of the tube is inflated to open up the artery. In some cases, a small, mesh tube (stent) is inserted into the artery to keep it open. Coronary artery bypass surgery. During this surgery, veins  or arteries from other parts of the body are used to create a bypass around the blockage and allow blood to reach your heart. Follow these instructions at home: Medicines Take over-the-counter and prescription medicines only as told by your health care provider. Do not take the following medicines unless your health care provider approves: NSAIDs, such as ibuprofen, naproxen, or celecoxib. Vitamin supplements that contain vitamin A, vitamin E, or both. Lifestyle Follow an exercise program approved by your health care provider. Ask your health care provider if cardiac rehab is appropriate. Maintain a healthy weight or lose weight as approved by your health care provider. Learn to manage stress or try to limit your stress. Ask your health care provider for suggestions if you need help. Get screened for depression and seek treatment, if needed. Do not use any products that contain nicotine or tobacco. These products include cigarettes, chewing tobacco, and vaping devices, such as e-cigarettes. If you need help quitting, ask your health care provider. Eating and drinking  Follow a heart-healthy diet. A dietitian can help educate you about healthy food options and changes. In general, eat plenty of fruits and vegetables, lean meats, and whole grains. Avoid foods high in: Sugar. Salt (sodium). Saturated fat, such as processed or fatty meat. Trans fat, such as fried foods. Use healthy cooking methods such as roasting, grilling, broiling, baking, poaching, steaming, or stir-frying. Do not drink alcohol if your health care provider tells you not to drink. If you drink alcohol: Limit how much you have to 0-2 drinks a day. Know how much alcohol is in your drink. In the [place] one drink equals one 12 oz bottle of beer (355 mL), one 5 oz glass of wine (148 mL), or one 1 oz glass of hard liquor (44 mL). General instructions Manage any other health conditions, such as high cholesterol, hypertension, and  diabetes. These conditions affect your heart. Your health care provider may ask you to monitor your blood pressure. Keep all follow-up visits. This is important. Get help right away if: You have pain in your chest, neck, ear, arm, jaw, stomach, or back that: Lasts more than a few minutes. Is recurring. Is not relieved by taking medicine under your tongue (sublingual nitroglycerin). You have profuse sweating without cause. You have unexplained: Heartburn or indigestion. Shortness of breath or difficulty breathing. Fluttering or fast heartbeat (palpitations). Nausea or vomiting. Fatigue or weakness. Feelings of nervousness or anxiety. You have sudden light-headedness or dizziness. You faint. These symptoms may be an emergency. Get help right away. Call 911. Do not wait to see if the symptoms will go away. Do not drive yourself to the hospital. Summary Coronary artery disease (CAD) is a condition in which the arteries that lead to the heart (coronary arteries) become narrow or blocked. Prolonged reduced blood flow can cause a heart attack. CAD can be treated with lifestyle changes, medicines, coronary angioplasty or stents, coronary artery bypass surgery, or a combination of these treatments. Keep all follow-up visits. This is important. This information is not intended to replace advice given to you by your health care provider. Make sure you discuss any questions you have with your health care provider. Document Revised: 05/30/2021 Document Reviewed: 05/30/2021 Elsevier Patient Education  2023 [employer]  

## 2021-12-19 MED ORDER — ATORVASTATIN CALCIUM 40 MG PO TABS: 40.0000 mg | ORAL_TABLET | Freq: Every day | ORAL | 3 refills | Status: DC

## 2021-12-19 NOTE — Telephone Encounter (Signed)
Spoke with pt regarding labs and instructions. Pt stated that he will like to have medication sent to [facility]. He will also do some add'l research re: statin medications.

## 2021-12-19 NOTE — Telephone Encounter (Signed)
Medication prescribed for him.

## 2021-12-19 NOTE — Addendum Note (Signed)
Addended by: Howard Pouch A on: 12/19/2021 02:24 PM   Modules accepted: Orders

## 2022-01-14 ENCOUNTER — Ambulatory Visit (HOSPITAL_BASED_OUTPATIENT_CLINIC_OR_DEPARTMENT_OTHER)
Admission: RE | Admit: 2022-01-14 | Discharge: 2022-01-14 | Disposition: A | Discharge status: Home / Self Care | Payer: Medicare Other | Source: Ambulatory Visit | Attending: Family Medicine | Admitting: Family Medicine

## 2022-01-14 ENCOUNTER — Encounter (HOSPITAL_BASED_OUTPATIENT_CLINIC_OR_DEPARTMENT_OTHER): Payer: Self-pay

## 2022-01-14 DIAGNOSIS — I672 Cerebral atherosclerosis: Secondary | ICD-10-CM | POA: Diagnosis not present

## 2022-01-14 DIAGNOSIS — I6521 Occlusion and stenosis of right carotid artery: Secondary | ICD-10-CM

## 2022-01-14 DIAGNOSIS — I700 Atherosclerosis of aorta: Secondary | ICD-10-CM | POA: Diagnosis not present

## 2022-01-14 DIAGNOSIS — I10 Essential (primary) hypertension: Secondary | ICD-10-CM | POA: Diagnosis not present

## 2022-01-14 DIAGNOSIS — E782 Mixed hyperlipidemia: Secondary | ICD-10-CM | POA: Insufficient documentation

## 2022-01-21 ENCOUNTER — Telehealth: Payer: Self-pay | Admitting: Family Medicine

## 2022-01-21 NOTE — Telephone Encounter (Signed)
His cardiac CT and carotid Doppler studies have been resulted.  Carotid Dopplers of the neck-showed moderate plaque buildup in both carotid arteries without significant narrowing in the left.  In the right carotid, where the vessel branches there is a 50-69% narrowing.  This is stable from last carotid ultrasound in 2020.  Cardiac CT score: Total: 88 Percentile: 29 This means there is some narrowing of the coronary arteries, very mild in most of the coronary arteries and is left anterior descending coronary artery contributes to the majority of the score reported above.    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  His aorta, large vessel that comes off the heart, also has been noted to have mild calcifications in the past.   When cholesterol/plaque is seen in these areas, it is also presumed the plaque/cholesterol is in the other vessels of the body including the brain.  Preventative measures are control of blood pressure and cholesterol levels.  We did call in a statin in June for him to consider starting.  I do highly recommend he start these given his above findings, he has multiple reasons to benefit from starting this medication. By American Heart Association criteria/calculation his risk for CV event such as a heart attack or stroke is approximately 30% in the next 10 years with the his cholesterol findings.  I hope this helps him make his decision to start cardiovascular protection with the statin medication.

## 2022-01-22 NOTE — Telephone Encounter (Signed)
LVM for pt to CB regarding results.  

## 2022-01-23 NOTE — Telephone Encounter (Signed)
Pt had difficulty hearing me. Pt will CB when have a chance

## 2022-01-23 NOTE — Telephone Encounter (Signed)
Spoke with pt regarding labs and instructions.   

## 2022-02-04 ENCOUNTER — Encounter: Payer: Self-pay | Admitting: Family Medicine

## 2022-02-04 ENCOUNTER — Ambulatory Visit (INDEPENDENT_AMBULATORY_CARE_PROVIDER_SITE_OTHER): Payer: Medicare Other | Admitting: Family Medicine

## 2022-02-04 VITALS — BP 175/71 | HR 59 | Temp 97.9°F | Ht 68.0 in | Wt 183.0 lb

## 2022-02-04 DIAGNOSIS — I240 Acute coronary thrombosis not resulting in myocardial infarction: Secondary | ICD-10-CM

## 2022-02-04 DIAGNOSIS — I6521 Occlusion and stenosis of right carotid artery: Secondary | ICD-10-CM | POA: Diagnosis not present

## 2022-02-04 DIAGNOSIS — I10 Essential (primary) hypertension: Secondary | ICD-10-CM

## 2022-02-04 DIAGNOSIS — E782 Mixed hyperlipidemia: Secondary | ICD-10-CM | POA: Diagnosis not present

## 2022-02-04 DIAGNOSIS — I700 Atherosclerosis of aorta: Secondary | ICD-10-CM | POA: Diagnosis not present

## 2022-02-04 NOTE — Patient Instructions (Addendum)
No follow-ups on file.        Great to see you today.  I have refilled the medication(s) we provide.   If labs were collected, we will inform you of lab results once received either by echart message or telephone call.   - echart message- for normal results that have been seen by the patient already.   - telephone call: abnormal results or if patient has not viewed results in their echart.  Heart Disease Prevention Heart disease is the leading cause of death in the world. Coronary artery disease is the most common cause of heart disease. This condition results when cholesterol and other substances (plaque) build up inside the walls of the blood vessels that supply your heart muscle (arteries). This buildup in arteries is called atherosclerosis. You can take actions to lower your risk of heart disease. How can heart disease affect me? Heart disease can cause many unpleasant symptoms and complications, such as: Chest pain (angina). Reduced or blocked blood flow to your heart. This can cause: Irregular heartbeats (arrhythmias). Heart attack. Heart failure. What can increase my risk? The following factors may make you more likely to develop this condition: High blood pressure (hypertension). High cholesterol. A diet high in saturated fats or trans fats. Obesity. Diabetes. Having a family history of heart disease. Certain lifestyle factors, including: Smoking. Lack of physical activity. Drinking too much alcohol. What actions can I take to prevent heart disease? Nutrition  Follow a heart-healthy eating plan as told by your health care provider. Examples include the DASH eating plan. DASH stands for Dietary Approaches to Stop Hypertension. Generally, it is recommended that you: Eat less salt (sodium). Ask your health care provider how much sodium is safe for you. Most people should have less than 2,300 mg each day. Limit unhealthy fats, such as saturated and trans fats, in your  diet. You can do this by eating low-fat dairy products, eating less red meat, and avoiding processed foods. Eat healthy fats (omega-3 fatty acids). These are found in fish, such as mackerel or salmon. Eat more fruits and vegetables. You should try to fill one-half of your plate with fruits and vegetables at each meal. Eat more whole grains. Avoid foods and drinks that have added sugars. Try to limit how much added sugar you have to: Less than 25 grams a day for women. Less than 36 grams a day for men. Lifestyle  Get regular exercise. This is one of the most important things you can do for your health. Generally, it is recommended that you: Exercise for at least 30 minutes on most days of the week (150 minutes each week). This should be exercise that causes your heart to beat faster (aerobic exercise). Add strength exercises on at least 2 days each week. Do not use any products that contain nicotine or tobacco. These products include cigarettes, chewing tobacco, and vaping devices, such as e-cigarettes. These can damage your heart and blood vessels. If you need help quitting, ask your health care provider. Alcohol use Do not drink alcohol if: Your health care provider tells you not to drink. You are pregnant, may be pregnant, or are planning to become pregnant. If you drink alcohol: Limit how much you have to: 0-1 drink a day for women. 0-2 drinks a day for men. Know how much alcohol is in your drink. In the [place] one drink equals one 12 oz bottle of beer (355 mL), one 5 oz glass of wine (148 mL), or one 1  oz glass of hard liquor (44 mL). Medicines Take over-the-counter and prescription medicines only as told by your health care provider. Work with your health care provider to find out whether it is safe and beneficial for you to take aspirin daily. Make sure that you understand how much to take and what form to take. Depending on your risk factors, your health care provider may prescribe  medicines to lower your risk of heart disease or to control related conditions. You may take medicine to: Lower cholesterol. Control blood pressure. Control diabetes. General information Keep your blood pressure under control, as recommended by your health care provider. For most healthy people, the upper number of their blood pressure (systolic) should be no higher than 120, and the lower number (diastolic) no higher than 80. Treatment may be needed if your blood pressure is higher than 130/80. Have your blood pressure checked at least every 2 years. Your health care provider may check your blood pressure more often if you have high blood pressure. After age 74, have your cholesterol checked every 4-6 years. If you have risk factors for heart disease, you may need to have it checked more often. Treatment may be needed if your cholesterol is high. Have your body mass index (BMI) checked every year. Your health care provider can calculate your BMI from your height and weight. Check your waist circumference. It should be: No more than 35 inches (89 cm) for women who are not pregnant. No more than 40 inches (102 cm) for men. Work with your health care provider to lose weight, if needed, or to maintain a healthy weight. Where to find more information: Centers for Disease Control and Prevention: TextNotebook.fi American Heart Association: www.heart.org Summary Heart disease is the leading cause of death in the world. Heart disease can cause chest pain, abnormal heart rhythms, heart attack, and heart failure. Some of the risk factors for heart disease include high blood pressure, high cholesterol, and smoking. You can take actions to lower your chances of developing heart disease. Work with your health care provider to reduce your risk by following a heart-healthy diet, being physically active, and controlling your weight, blood pressure, and cholesterol level. This information is not  intended to replace advice given to you by your health care provider. Make sure you discuss any questions you have with your health care provider. Document Revised: 02/28/2021 Document Reviewed: 02/28/2021 Elsevier Patient Education  [place].

## 2022-02-04 NOTE — Progress Notes (Unsigned)
Patient ID: Nathan [name], male  DOB: September 10, 1945, 76 y.o.   MRN: [id number] Patient Care Team    Relationship Specialty Notifications Start End  Ma Hillock, DO PCP - General Family Medicine  12/07/15   Pa, [facility]    06/02/17   Leandrew Koyanagi, MD Attending Physician Orthopedic Surgery  06/02/17   Ladell Pier, MD Consulting Physician Oncology  05/29/20   Michael Boston, MD Consulting Physician General Surgery  06/19/20   Richardo Priest, MD Consulting Physician Cardiology  06/19/20   Armbruster, Carlota Raspberry, MD Consulting Physician Gastroenterology  06/26/20     Chief Complaint  Patient presents with   atherosclerosis    Pt had questions in re: CT and doppler studies    Subjective: Nathan [name] is a 76 y.o. male present for discuss his cardiac CT and atherosclerosis.  His wife is present with him today.  Patient reports he has not been taking his blood pressure medications or cholesterol medication.  He has concerns over taking these medications and would like to be able to not need medications and control his cardiovascular health more naturally.  He reports he is exercising and plans to exercise more routinely.  They have changed their dietary habits within the home and he is eating more of a heart healthy diet but admits he does tend to salt his food quite a bit. Patient has a CV risk of approximately 30% CV event in the next 10 years. RF: hypertension, hyperlipidemia, former smoker, fhx heart disease, BMI>25   Patient has a history of right carotid artery stenosis. 01/2022: Coronary Calcium score FINDINGS: Coronary arteries: Normal origins. Coronary Calcium Score: Left main: 2 Left anterior descending artery: 62 Left circumflex artery: 8 Right coronary artery: 16 Total: 88 Percentile: 29 Pericardium: Normal. Aorta: Normal caliber of ascending aorta. Aortic atherosclerosis noted. Non-cardiac: See separate report from [facility]  Radiology. IMPRESSION: Coronary calcium score of 88. This was 29th percentile for age-, race-, and sex-matched controls.  RIGHT   Carotid duplex 01/2022:  IMPRESSION: Right: Heterogeneous and partially calcified plaque at the right carotid bifurcation contributes to 50%-69% stenosis by established duplex criteria. Left: Color duplex indicates moderate heterogeneous and calcified plaque, with no hemodynamically significant stenosis by duplex criteria in the extracranial cerebrovascular circulation.    01/26/2019 carotid vascular ultrasound: IMPRESSION: 1. Moderate (50-69%) stenosis proximal right internal carotid artery secondary to heterogeneous and mildly irregular atherosclerotic plaque. 2. Mild (1-49%) stenosis proximal left internal carotid artery secondary to heterogeneous and slightly irregular atherosclerotic plaque. 3. Flow reversal in the left vertebral artery consistent with subclavian steal in the setting of a hemodynamically significant stenosis or occlusion of the proximal subclavian artery. 4. Normal antegrade flow in the right vertebral artery  Echo 02/11/2019:  1. The left ventricle has normal systolic function, with an ejection  fraction of 60-65%. The cavity size was normal. There is mildly increased  left ventricular wall thickness. Left ventricular diastolic parameters  were normal.   2. The right ventricle has normal systolc function. The cavity was mildly  enlarged. There is no increase in right ventricular wall thickness. Right  ventricular systolic pressure is normal with an estimated pressure of 28.0  mmHg.   3. Aortic valve regurgitation was not assessed by color flow Doppler.   4. Pulmonic valve regurgitation was not assessed by color flow Doppler.   02/16/2019-myocardial perfusion scan: The left ventricular ejection fraction is normal (55-65%). Nuclear stress EF: 55%. There was no ST  segment deviation noted during stress. Defect 1: There is a  small defect of mild severity present in the apex location. The study is normal. This is a low risk study.   Normal stress nuclear study with mild apical thinning but no ischemia.  Gated ejection fraction 55% with normal wall motion.    10/24/2021    8:20 AM 04/12/2021    8:30 AM 09/20/2020    9:13 AM 04/20/2018    9:40 AM 06/02/2017    8:48 AM  Depression screen PHQ 2/9  Decreased Interest 0 0 0 0 0  Down, Depressed, Hopeless 1 0 0 0 0  PHQ - 2 Score 1 0 0 0 0  Altered sleeping    0 0  Tired, decreased energy    1 0  Change in appetite    0 0  Feeling bad or failure about yourself     0 0  Trouble concentrating    0 0  Moving slowly or fidgety/restless    0 0  Suicidal thoughts    0 0  PHQ-9 Score    1 0  Difficult doing work/chores    Not difficult at all Not difficult at all      08/24/2015    2:57 PM  GAD 7 : Generalized Anxiety Score  Nervous, Anxious, on Edge 2  Control/stop worrying 3  Worry too much - different things 3  Trouble relaxing 1  Restless 0  Easily annoyed or irritable 2  Afraid - awful might happen 0  Total GAD 7 Score 11  Anxiety Difficulty Somewhat difficult        10/24/2021    8:23 AM 04/12/2021    8:30 AM 09/20/2020    9:11 AM 02/04/2020   10:04 AM 04/20/2018    9:40 AM  Fall Risk   Falls in the past year? 0 0 1 1 No  Number falls in past yr: 0 0 0 0   Injury with Fall? 0 0 0 0   Risk for fall due to : Impaired balance/gait  History of fall(s)    Risk for fall due to: Comment not like it used to be      Follow up Falls prevention discussed Falls evaluation completed Falls prevention discussed     Immunization History  Administered Date(s) Administered   PFIZER(Purple Top)SARS-COV-2 Vaccination 09/20/2019, 10/20/2019, 07/03/2020   PPD Test 03/17/2013   Tdap 07/15/2012   Zoster, Live 02/06/2015   Past Medical History:  Diagnosis Date   Adrenal mass ([place]) 2018   Urology fully evaluated; benign   Allergy    hay fever   Anemia    resolved  spontaneously   Anxiety    Cataract    Cervicalgia    Complication of anesthesia    hypoglycemia, low blood pressure, pt. reports it dropped to zero- post op MORPHINE , then taken ICU due to either the morphine or hypoglycemia . In addition post op from cataract surgery- 07/2015, experienced an episode of hypoglycemia     Depressive disorder, not elsewhere classified    Diverticulosis    Elevated prostate specific antigen (PSA) 2008   Dr Jeffie Pollock   Fecal occult blood test positive 02/07/2020   Gallstone    GERD (gastroesophageal reflux disease)    H/O cardiovascular stress test >10 yrs.   told that its wnl. Told to drink less coffee   Hematuria 2018   Hematuria with enlarged prostate, kidney cyst, adrenal adenoma--> followed by urology with cystoscopy and repeat  CT, benign workup.   Hx of migraines    Hypertension    Hyperthyroidism    Treated with medication no longer needed at this time   Hypoglycemia    IBS (irritable bowel syndrome)    patient not aware   Inguinal hernia 12/2015   Bilateral small indirect hernias on CT, umbilical hernia also present.   Kidney cyst, acquired 2018   Followed by urology, benign.   Low back pain    Macular degeneration (senile) of retina, unspecified    2 different opinions from 2 different Ophth   Osteoarthrosis, unspecified whether generalized or localized, unspecified site    Other and unspecified hyperlipidemia    Prostatitis 2008   Rectal cancer ([place])    Refusal of blood transfusions as patient is Jehovah's Witness    Segmental and somatic dysfunction of cervical region    Segmental and somatic dysfunction of lumbar region    Segmental and somatic dysfunction of sacral region    Segmental and somatic dysfunction of thoracic region    Spondylosis of lumbar spine    Strain of pelvis    Tuberculosis    had exposure as child tests positive on skin test.   Allergies  Allergen Reactions   Morphine And Related Other (See Comments)    Severe  drop in blood pressure   Citalopram Hydrobromide Other (See Comments)    PATIENT PREFERENCE: "Just ineffective"   Sertraline Hcl Other (See Comments)    Headache   Past Surgical History:  Procedure Laterality Date   BIOPSY  05/24/2020   Procedure: BIOPSY;  Surgeon: Irving Copas., MD;  Location: [facility] ENDOSCOPY;  Service: Gastroenterology;;   CATARACT EXTRACTION, BILATERAL     COLONOSCOPY  2012   Dr Olevia Perches   COLONOSCOPY WITH PROPOFOL N/A 05/24/2020   Procedure: COLONOSCOPY WITH PROPOFOL;  Surgeon: Irving Copas., MD;  Location: [facility] END[facility]COPY;  Service: Gastroenterology;  Laterality: N/A;   CYSTOSCOPY     for hematuria   EUS N/A 05/24/2020   Procedure: LOWER ENDOSCOPIC ULTRASOUND (EUS);  Surgeon: Irving Copas., MD;  Location: [place];  Service: Gastroenterology;  Laterality: N/A;   hydrocoelectomy  2003   Dr Jeffie Pollock   KNEE ARTHROSCOPY Left 1994   OSTOMY N/A 06/21/2020   Procedure: POSSIBLE OSTOMY;  Surgeon: Michael Boston, MD;  Location: [facility];  Service: General;  Laterality: N/A;   POLYPECTOMY  05/24/2020   Procedure: POLYPECTOMY;  Surgeon: Irving Copas., MD;  Location: [facility] ENDOSCOPY;  Service: Gastroenterology;;   PROCTOSCOPY N/A 06/21/2020   Procedure: RIGID PROCTOSCOPY;  Surgeon: Michael Boston, MD;  Location: [facility];  Service: General;  Laterality: N/A;   prostate nodule resection  2003   TONSILLECTOMY     TOTAL KNEE ARTHROPLASTY  2009   TOTAL KNEE ARTHROPLASTY Right 06/27/2016   Procedure: TOTAL KNEE ARTHROPLASTY;  Surgeon: Leandrew Koyanagi, MD;  Location: [place];  Service: Orthopedics;  Laterality: Right;   XI ROBOTIC ASSISTED LOWER ANTERIOR RESECTION N/A 06/21/2020   Procedure: XI ROBOTIC ASSISTED LOWER ANTERIOR RECTOSIGMOID RESECTION, BILATERAL TAP BLOCK;  Surgeon: Michael Boston, MD;  Location: [facility];  Service: General;  Laterality: N/A;   Family History  Problem Relation Age of Onset   Heart disease Mother    Lung cancer Father         smoker   Alcohol abuse Father    Tuberculosis Father    Heart disease Father    Early death Father    Heart attack Brother  Diabetes Other    Stroke Neg Hx    Thyroid disease Neg Hx    Colon cancer Neg Hx    Stomach cancer Neg Hx    Rectal cancer Neg Hx    Esophageal cancer Neg Hx    Inflammatory bowel disease Neg Hx    Liver disease Neg Hx    Pancreatic cancer Neg Hx    Colon polyps Neg Hx    Social History   Social History Narrative   Married. Wife's name is [name]. Full time employed, Pensions consultant.   Drinks caffeinated beverages. Take a multivitamin. Wears his seatbelt.   Exercises at least 3 times a week. Is not a strict vegetarian, but only eats meat 1-2 times a week   Wears a hearing aid. Does not wear dentures or use an assistive walking device.   There is a smoke detector in his home.   He feels safe in his relationships.    Allergies as of 02/04/2022       Reactions   Morphine And Related Other (See Comments)   Severe drop in blood pressure   Citalopram Hydrobromide Other (See Comments)   PATIENT PREFERENCE: "Just ineffective"   Sertraline Hcl Other (See Comments)   Headache        Medication List        Accurate as of February 04, 2022 11:59 PM. If you have any questions, ask your nurse or doctor.          amLODipine 10 MG tablet Commonly known as: NORVASC Take 1 tablet (10 mg total) by mouth daily.   atorvastatin 40 MG tablet Commonly known as: LIPITOR Take 1 tablet (40 mg total) by mouth at bedtime.   b complex vitamins tablet Take 1 tablet by mouth every other day.   lisinopril 10 MG tablet Commonly known as: ZESTRIL Take 1 tablet (10 mg total) by mouth daily.   Multiple Vitamin tablet Take 1 tablet by mouth daily.       All past medical history, surgical history, allergies, family history, immunizations andmedications were updated in the EMR today and reviewed under the history and medication portions of their EMR.      Recent Results (from the past 2160 hour(s))  CBC w/Diff     Status: None   Collection Time: 12/18/21 11:13 AM  Result Value Ref Range   WBC 8.4 4.0 - 10.5 K/uL   RBC 4.84 4.22 - 5.81 Mil/uL   Hemoglobin 14.5 13.0 - 17.0 g/dL   HCT 43.9 39.0 - 52.0 %   MCV 90.8 78.0 - 100.0 fl   MCHC 33.1 30.0 - 36.0 g/dL   RDW 14.6 11.5 - 15.5 %   Platelets 256.0 150.0 - 400.0 K/uL   Neutrophils Relative % 63.1 43.0 - 77.0 %   Lymphocytes Relative 26.6 12.0 - 46.0 %   Monocytes Relative 8.0 3.0 - 12.0 %   Eosinophils Relative 1.7 0.0 - 5.0 %   Basophils Relative 0.6 0.0 - 3.0 %   Neutro Abs 5.3 1.4 - 7.7 K/uL   Lymphs Abs 2.2 0.7 - 4.0 K/uL   Monocytes Absolute 0.7 0.1 - 1.0 K/uL   Eosinophils Absolute 0.1 0.0 - 0.7 K/uL   Basophils Absolute 0.0 0.0 - 0.1 K/uL  TSH     Status: None   Collection Time: 12/18/21 11:13 AM  Result Value Ref Range   TSH 1.83 0.35 - 5.50 uIU/mL  Comp Met (CMET)     Status: None  Collection Time: 12/18/21 11:13 AM  Result Value Ref Range   Sodium 140 135 - 145 mEq/L   Potassium 3.7 3.5 - 5.1 mEq/L   Chloride 104 96 - 112 mEq/L   CO2 25 19 - 32 mEq/L   Glucose, Bld 86 70 - 99 mg/dL   BUN 17 6 - 23 mg/dL   Creatinine, Ser 0.94 0.40 - 1.50 mg/dL   Total Bilirubin 0.8 0.2 - 1.2 mg/dL   Alkaline Phosphatase 68 39 - 117 U/L   AST 16 0 - 37 U/L   ALT 12 0 - 53 U/L   Total Protein 6.5 6.0 - 8.3 g/dL   Albumin 4.1 3.5 - 5.2 g/dL   GFR 79.03 >60.00 mL/min    Comment: Calculated using the CKD-EPI Creatinine Equation (2021)   Calcium 9.4 8.4 - 10.5 mg/dL  IBC + Ferritin     Status: None   Collection Time: 12/18/21 11:13 AM  Result Value Ref Range   Iron 141 42 - 165 ug/dL   Transferrin 313.0 212.0 - 360.0 mg/dL   Saturation Ratios 32.2 20.0 - 50.0 %   Ferritin 25.9 22.0 - 322.0 ng/mL   TIBC 438.2 250.0 - 450.0 mcg/dL  Direct LDL     Status: None   Collection Time: 12/18/21 11:13 AM  Result Value Ref Range   Direct LDL 192.0 mg/dL    Comment: Optimal:  <100  mg/dLNear or Above Optimal:  100-129 mg/dLBorderline High:  130-159 mg/dLHigh:  160-189 mg/dLVery High:  >190 mg/dL    No results found.  ROS: 14 pt review of systems performed and negative (unless mentioned in an HPI)  Objective: BP (!) 175/71   Pulse (!) 59   Temp 97.9 F (36.6 C) (Oral)   Ht 5' 8"  (1.727 m)   Wt 183 lb (83 kg)   SpO2 97%   BMI 27.83 kg/m  Physical Exam Vitals and nursing note reviewed.  Constitutional:      General: He is not in acute distress.    Appearance: Normal appearance. He is not ill-appearing, toxic-appearing or diaphoretic.  HENT:     Head: Normocephalic and atraumatic.  Eyes:     General: No scleral icterus.       Right eye: No discharge.        Left eye: No discharge.     Extraocular Movements: Extraocular movements intact.     Pupils: Pupils are equal, round, and reactive to light.  Cardiovascular:     Rate and Rhythm: Normal rate and regular rhythm.  Pulmonary:     Effort: Pulmonary effort is normal.     Breath sounds: Normal breath sounds. No rhonchi or rales.  Musculoskeletal:     Cervical back: Neck supple.     Right lower leg: No edema.     Left lower leg: No edema.  Lymphadenopathy:     Cervical: No cervical adenopathy.  Skin:    General: Skin is warm and dry.     Findings: No rash.  Neurological:     Mental Status: He is alert and oriented to person, place, and time. Mental status is at baseline.  Psychiatric:        Mood and Affect: Mood normal.        Behavior: Behavior normal.        Thought Content: Thought content normal.        Judgment: Judgment normal.     No results found.  Assessment/plan: VANNA SHAVERS is a 76 y.o. male  present for [facility] Essential hypertension, benign/Atherosclerosis of aorta (HCC)/Overweight (BMI 25.0-29.9)/Mixed hyperlipidemia/Statin declined/Atherosclerosis of aorta ([place]) Lengthy conversation today with patient and his wife around cardiovascular health and risks.  We discussed in great  detail the results of his studies. We discussed genetics plays a role in cardiovascular health.  It is not uncommon to eat healthy, exercise yet require blood pressure medications and cholesterol medications to maintain cardiac health as we age.   Recommend he continue his dietary changes with a heart healthy diet.  He could still use lower sodium in his diet and he is aware working on this. Continue to exercise with the benchmark of 150 minutes/week. Restart amlodipine 10 mg qd Restart lisinopril at 10 mg qd.  Start Lipitor 40 mg nightly CV risk 30% - CT CARDIAC(01/2022):29th % -Carotid stenosis stable Stenosis of right carotid artery - US Carotid Duplex Bilateral;(01/2022)> unchanged from 2020.  Offered referral back to cardiology to discuss in more detail and obtain their recommendations, he declined for now. Follow-up per his routine follow-up schedule for his chronic conditions    No follow-ups on file.   No orders of the defined types were placed in this encounter.  No orders of the defined types were placed in this encounter.  Referral Orders  No referral(s) requested today     Note is dictated utilizing voice recognition software. Although note has been proof read prior to signing, occasional typographical errors still can be missed. If any questions arise, please do not hesitate to call for verification.  Electronically signed by: Howard Pouch, DO [place]

## 2022-03-22 ENCOUNTER — Telehealth: Payer: Self-pay | Admitting: *Deleted

## 2022-03-22 ENCOUNTER — Encounter: Payer: Self-pay | Admitting: *Deleted

## 2022-03-22 NOTE — Patient Outreach (Signed)
  Care Coordination   Initial Visit Note   03/22/2022 Name: Nathan [name] MRN: [id number] DOB: August 24, 1945  Nathan [name] is a 76 y.o. year old male who sees Kuneff, Renee A, DO for primary care. I spoke with  [name] by phone today.  What matters to the patients health and wellness today?  No needs    Goals Addressed               This Visit's Progress     COMPLETED: No needs (pt-stated)        Care Coordination Interventions: Reviewed medications with patient and discussed issues and education as needed Reviewed scheduled/upcoming provider appointments including pending appointments and verified AWV was completed on 10/24/2021 with his primary provider. Screening for signs and symptoms of depression related to chronic disease state  Assessed social determinant of health barriers         SDOH assessments and interventions completed:  Yes  SDOH Interventions Today    Flowsheet Row Most Recent Value  SDOH Interventions   Food Insecurity Interventions Intervention Not Indicated  Housing Interventions Intervention Not Indicated  Transportation Interventions Intervention Not Indicated        Care Coordination Interventions Activated:  Yes  Care Coordination Interventions:  Yes, provided   Follow up plan: No further intervention required.   Encounter Outcome:  Pt. Visit Completed   Raina Mina, RN Care Management Coordinator [place] Office [phone]

## 2022-03-22 NOTE — Patient Outreach (Signed)
  Care Coordination   03/22/2022 Name: Nathan [name] MRN: [id number] DOB: [birth year]   Care Coordination Outreach Attempts:  An unsuccessful telephone outreach was attempted today to offer the patient information about available care coordination services as a benefit of their health plan.   Follow Up Plan:  Additional outreach attempts will be made to offer the patient care coordination information and services.   Encounter Outcome:  No Answer  Care Coordination Interventions Activated:  No   Care Coordination Interventions:  No, not indicated    Raina Mina, RN Care Management Coordinator [place] Office [phone]

## 2022-03-22 NOTE — Patient Instructions (Signed)
Visit Information  Thank you for taking time to visit with me today. Please don't hesitate to contact me if I can be of assistance to you.   Following are the goals we discussed today:   Goals Addressed               This Visit's Progress     COMPLETED: No needs (pt-stated)        Care Coordination Interventions: Reviewed medications with patient and discussed issues and education as needed Reviewed scheduled/upcoming provider appointments including pending appointments and verified AWV was completed on 10/24/2021 with his primary provider. Screening for signs and symptoms of depression related to chronic disease state  Assessed social determinant of health barriers         Please call the care guide team at [phone] if you need to cancel or reschedule your appointment.   If you are experiencing a Mental Health or [place] or need someone to talk to, please call the Suicide and Crisis Lifeline: 988 call the [place] National Suicide Prevention Lifeline: 917-113-9514 or TTY: 279-796-5066 TTY (306) 550-6039) to talk to a trained counselor call 1-800-273-TALK (toll free, 24 hour hotline)  Patient verbalizes understanding of instructions and care plan provided today and agrees to view in [place]. Active MyChart status and patient understanding of how to access instructions and care plan via MyChart confirmed with patient.     No further follow up required: No needs  Raina Mina, RN Care Management Coordinator [place] Office [phone]

## 2022-04-30 ENCOUNTER — Encounter: Payer: Medicare Other | Admitting: Family Medicine

## 2022-05-22 ENCOUNTER — Encounter: Payer: Self-pay | Admitting: Family Medicine

## 2022-05-22 ENCOUNTER — Ambulatory Visit (INDEPENDENT_AMBULATORY_CARE_PROVIDER_SITE_OTHER): Payer: Medicare Other | Admitting: Family Medicine

## 2022-05-22 VITALS — BP 148/78 | HR 57 | Temp 98.1°F | Ht 68.7 in | Wt 184.8 lb

## 2022-05-22 DIAGNOSIS — Z2821 Immunization not carried out because of patient refusal: Secondary | ICD-10-CM

## 2022-05-22 DIAGNOSIS — Z0000 Encounter for general adult medical examination without abnormal findings: Secondary | ICD-10-CM | POA: Diagnosis not present

## 2022-05-22 DIAGNOSIS — Z79899 Other long term (current) drug therapy: Secondary | ICD-10-CM

## 2022-05-22 DIAGNOSIS — I700 Atherosclerosis of aorta: Secondary | ICD-10-CM | POA: Diagnosis not present

## 2022-05-22 DIAGNOSIS — Z125 Encounter for screening for malignant neoplasm of prostate: Secondary | ICD-10-CM

## 2022-05-22 DIAGNOSIS — E782 Mixed hyperlipidemia: Secondary | ICD-10-CM | POA: Diagnosis not present

## 2022-05-22 DIAGNOSIS — I6521 Occlusion and stenosis of right carotid artery: Secondary | ICD-10-CM | POA: Diagnosis not present

## 2022-05-22 DIAGNOSIS — I10 Essential (primary) hypertension: Secondary | ICD-10-CM | POA: Diagnosis not present

## 2022-05-22 DIAGNOSIS — Z531 Procedure and treatment not carried out because of patient's decision for reasons of belief and group pressure: Secondary | ICD-10-CM | POA: Diagnosis not present

## 2022-05-22 DIAGNOSIS — I240 Acute coronary thrombosis not resulting in myocardial infarction: Secondary | ICD-10-CM

## 2022-05-22 LAB — CBC WITH DIFFERENTIAL/PLATELET
Basophils Absolute: 0.1 10*3/uL (ref 0.0–0.1)
Basophils Relative: 0.7 % (ref 0.0–3.0)
Eosinophils Absolute: 0.1 10*3/uL (ref 0.0–0.7)
Eosinophils Relative: 1.8 % (ref 0.0–5.0)
HCT: 39.1 % (ref 39.0–52.0)
Hemoglobin: 13.1 g/dL (ref 13.0–17.0)
Lymphocytes Relative: 30.3 % (ref 12.0–46.0)
Lymphs Abs: 2.4 10*3/uL (ref 0.7–4.0)
MCHC: 33.4 g/dL (ref 30.0–36.0)
MCV: 90.7 fl (ref 78.0–100.0)
Monocytes Absolute: 0.7 10*3/uL (ref 0.1–1.0)
Monocytes Relative: 8.4 % (ref 3.0–12.0)
Neutro Abs: 4.7 10*3/uL (ref 1.4–7.7)
Neutrophils Relative %: 58.8 % (ref 43.0–77.0)
Platelets: 257 10*3/uL (ref 150.0–400.0)
RBC: 4.31 Mil/uL (ref 4.22–5.81)
RDW: 15.7 % — ABNORMAL HIGH (ref 11.5–15.5)
WBC: 8.1 10*3/uL (ref 4.0–10.5)

## 2022-05-22 LAB — LIPID PANEL
Cholesterol: 286 mg/dL — ABNORMAL HIGH (ref 0–200)
HDL: 69 mg/dL (ref >39.00)
LDL Cholesterol: 191 mg/dL — ABNORMAL HIGH (ref 0–99)
NonHDL: 216.73
Total CHOL/HDL Ratio: 4
Triglycerides: 129 mg/dL (ref 0.0–149.0)
VLDL: 25.8 mg/dL (ref 0.0–40.0)

## 2022-05-22 LAB — COMPREHENSIVE METABOLIC PANEL
ALT: 11 U/L (ref 0–53)
AST: 15 U/L (ref 0–37)
Albumin: 4.2 g/dL (ref 3.5–5.2)
Alkaline Phosphatase: 60 U/L (ref 39–117)
BUN: 19 mg/dL (ref 6–23)
CO2: 28 mEq/L (ref 19–32)
Calcium: 9.3 mg/dL (ref 8.4–10.5)
Chloride: 103 mEq/L (ref 96–112)
Creatinine, Ser: 0.94 mg/dL (ref 0.40–1.50)
GFR: 78.79 mL/min (ref >60.00)
Glucose, Bld: 76 mg/dL (ref 70–99)
Potassium: 4.5 mEq/L (ref 3.5–5.1)
Sodium: 137 mEq/L (ref 135–145)
Total Bilirubin: 0.6 mg/dL (ref 0.2–1.2)
Total Protein: 6.8 g/dL (ref 6.0–8.3)

## 2022-05-22 LAB — TSH: TSH: 1.63 u[IU]/mL (ref 0.35–5.50)

## 2022-05-22 LAB — PSA: PSA: 5.7 ng/mL — ABNORMAL HIGH (ref 0.10–4.00)

## 2022-05-22 LAB — HEMOGLOBIN A1C: Hgb A1c MFr Bld: 5.6 % (ref 4.6–6.5)

## 2022-05-22 MED ORDER — AMLODIPINE BESYLATE 10 MG PO TABS: 10.0000 mg | ORAL_TABLET | Freq: Every day | ORAL | 1 refills | Status: DC

## 2022-05-22 MED ORDER — LISINOPRIL 10 MG PO TABS: 10.0000 mg | ORAL_TABLET | Freq: Every day | ORAL | 1 refills | Status: DC

## 2022-05-22 NOTE — Progress Notes (Signed)
Patient ID: Nathan [name], male  DOB: 25-Nov-1945, 76 y.o.   MRN: [id number] Patient Care Team    Relationship Specialty Notifications Start End  Ma Hillock, DO PCP - General Family Medicine  12/07/15   Pa, [facility]    06/02/17   Leandrew Koyanagi, MD Attending Physician Orthopedic Surgery  06/02/17   Ladell Pier, MD Consulting Physician Oncology  05/29/20   Michael Boston, MD Consulting Physician General Surgery  06/19/20   Richardo Priest, MD Consulting Physician Cardiology  06/19/20   Armbruster, Carlota Raspberry, MD Consulting Physician Gastroenterology  06/26/20   Tobi Bastos, RN [facility]   03/22/22     Chief Complaint  Patient presents with   Annual Exam    Pt is not fasting    Subjective: Nathan [name] is a 76 y.o. male present for [facility] appointment Health maintenance Colonoscopy: last screen 03/2020. Rectal cancer s/p robotic LAR 06/2020.Nathan [name] Has colonoscopy scheduled in Nov. 2022.  Immunizations:  tdap 2014 UTD, influenza declines, PNA series declines, zostavax declines, covid declines Infectious disease screening:Hep C declines. PSA: Followed by urology, last appt was over a year ago.  Hypertension/atheroscolerosis of aorta/statin declined: Pt reports compliance amlodipine 10 mg daily and Lisinopril 10 mg qd. Patient denies chest pain, shortness of breath, dizziness or lower extremity edema.   he switched to a plant based diet last year He has pedal he refused statin medication, or other cholesterol medications.  He has been referred to cardiology in the past. Exercise: Has started to exercise routinely. Using stationary bike 6 times a week for approximately 20-30 minutes. RF: hypertension, hyperlipidemia, pulm former smoker, fhx heart disease, BMI>25    Patient has a CV risk of approximately 30% CV event in the next 10 years. RF: hypertension, hyperlipidemia, former smoker, fhx heart disease, BMI>25 Patient has a  history of right carotid artery stenosis. 01/2022: Coronary Calcium score FINDINGS: Coronary arteries: Normal origins. Coronary Calcium Score: Left main: 2 Left anterior descending artery: 62 Left circumflex artery: 8 Right coronary artery: 16 Total: 88 Percentile: 29 Pericardium: Normal. Aorta: Normal caliber of ascending aorta. Aortic atherosclerosis noted. Non-cardiac: See separate report from [facility]. IMPRESSION: Coronary calcium score of 88. This was 29th percentile for age-, race-, and sex-matched controls.    Carotid duplex 01/2022:  IMPRESSION: Right: Heterogeneous and partially calcified plaque at the right carotid bifurcation contributes to 50%-69% stenosis by established duplex criteria. Left: Color duplex indicates moderate heterogeneous and calcified plaque, with no hemodynamically significant stenosis by duplex criteria in the extracranial cerebrovascular circulation.    01/26/2019 carotid vascular ultrasound: IMPRESSION: 1. Moderate (50-69%) stenosis proximal right internal carotid artery secondary to heterogeneous and mildly irregular atherosclerotic plaque. 2. Mild (1-49%) stenosis proximal left internal carotid artery secondary to heterogeneous and slightly irregular atherosclerotic plaque. 3. Flow reversal in the left vertebral artery consistent with subclavian steal in the setting of a hemodynamically significant stenosis or occlusion of the proximal subclavian artery. 4. Normal antegrade flow in the right vertebral artery  Echo 02/11/2019:  1. The left ventricle has normal systolic function, with an ejection  fraction of 60-65%. The cavity size was normal. There is mildly increased  left ventricular wall thickness. Left ventricular diastolic parameters  were normal.   2. The right ventricle has normal systolc function. The cavity was mildly  enlarged. There is no increase in right ventricular wall thickness. Right  ventricular systolic  pressure is normal with an estimated  pressure of 28.0  mmHg.   3. Aortic valve regurgitation was not assessed by color flow Doppler.   4. Pulmonic valve regurgitation was not assessed by color flow Doppler.   02/16/2019-myocardial perfusion scan: The left ventricular ejection fraction is normal (55-65%). Nuclear stress EF: 55%. There was no ST segment deviation noted during stress. Defect 1: There is a small defect of mild severity present in the apex location. The study is normal. This is a low risk study.   Normal stress nuclear study with mild apical thinning but no ischemia.  Gated ejection fraction 55% with normal wall motion.    05/22/2022   10:31 AM 03/22/2022    2:48 PM 10/24/2021    8:20 AM 04/12/2021    8:30 AM 09/20/2020    9:13 AM  Depression screen PHQ 2/9  Decreased Interest 1 0 0 0 0  Down, Depressed, Hopeless  0 1 0 0  PHQ - 2 Score 1 0 1 0 0  Altered sleeping 3      Tired, decreased energy 3      Change in appetite 0      Feeling bad or failure about yourself  0      Trouble concentrating 0      Moving slowly or fidgety/restless 0      Suicidal thoughts 0      PHQ-9 Score 7          05/22/2022   10:31 AM 08/24/2015    2:57 PM  GAD 7 : Generalized Anxiety Score  Nervous, Anxious, on Edge 2 2  Control/stop worrying 2 3  Worry too much - different things 2 3  Trouble relaxing 1 1  Restless 0 0  Easily annoyed or irritable 2 2  Afraid - awful might happen 1 0  Total GAD 7 Score 10 11  Anxiety Difficulty  Somewhat difficult        10/24/2021    8:23 AM 04/12/2021    8:30 AM 09/20/2020    9:11 AM 02/04/2020   10:04 AM 04/20/2018    9:40 AM  Fall Risk   Falls in the past year? 0 0 1 1 No  Number falls in past yr: 0 0 0 0   Injury with Fall? 0 0 0 0   Risk for fall due to : Impaired balance/gait  History of fall(s)    Risk for fall due to: Comment not like it used to be      Follow up Falls prevention discussed Falls evaluation completed Falls prevention  discussed     Immunization History  Administered Date(s) Administered   PFIZER(Purple Top)SARS-COV-2 Vaccination 09/20/2019, 10/20/2019, 07/03/2020   PPD Test 03/17/2013   Tdap 07/15/2012   Zoster, Live 02/06/2015   Past Medical History:  Diagnosis Date   Adrenal mass ([place]) 2018   Urology fully evaluated; benign   Allergy    hay fever   Anemia    resolved spontaneously   Anxiety    Cataract    Cervicalgia    Complication of anesthesia    hypoglycemia, low blood pressure, pt. reports it dropped to zero- post op MORPHINE , then taken ICU due to either the morphine or hypoglycemia . In addition post op from cataract surgery- 07/2015, experienced an episode of hypoglycemia     Depressive disorder, not elsewhere classified    Diverticulosis    Elevated prostate specific antigen (PSA) 2008   Dr Jeffie Pollock   Fecal occult blood test positive 02/07/2020  Gallstone    GERD (gastroesophageal reflux disease)    H/O cardiovascular stress test >10 yrs.   told that its wnl. Told to drink less coffee   Hematuria 2018   Hematuria with enlarged prostate, kidney cyst, adrenal adenoma--> followed by urology with cystoscopy and repeat CT, benign workup.   Hx of migraines    Hypertension    Hyperthyroidism    Treated with medication no longer needed at this time   Hypoglycemia    IBS (irritable bowel syndrome)    patient not aware   Inguinal hernia 12/2015   Bilateral small indirect hernias on CT, umbilical hernia also present.   Kidney cyst, acquired 2018   Followed by urology, benign.   Low back pain    Macular degeneration (senile) of retina, unspecified    2 different opinions from 2 different Ophth   Osteoarthrosis, unspecified whether generalized or localized, unspecified site    Other and unspecified hyperlipidemia    Prostatitis 2008   Rectal cancer ([place])    Refusal of blood transfusions as patient is Jehovah's Witness    Segmental and somatic dysfunction of cervical region     Segmental and somatic dysfunction of lumbar region    Segmental and somatic dysfunction of sacral region    Segmental and somatic dysfunction of thoracic region    Spondylosis of lumbar spine    Strain of pelvis    Tuberculosis    had exposure as child tests positive on skin test.   Allergies  Allergen Reactions   Morphine And Related Other (See Comments)    Severe drop in blood pressure   Sertraline Hcl Other (See Comments)    Headache   Past Surgical History:  Procedure Laterality Date   BIOPSY  05/24/2020   Procedure: BIOPSY;  Surgeon: Irving Copas., MD;  Location: [facility] ENDOSCOPY;  Service: Gastroenterology;;   CATARACT EXTRACTION, BILATERAL     COLONOSCOPY  2012   Dr Olevia Perches   COLONOSCOPY WITH PROPOFOL N/A 05/24/2020   Procedure: COLONOSCOPY WITH PROPOFOL;  Surgeon: Irving Copas., MD;  Location: [facility];  Service: Gastroenterology;  Laterality: N/A;   CYSTOSCOPY     for hematuria   EUS N/A 05/24/2020   Procedure: LOWER ENDOSCOPIC ULTRASOUND (EUS);  Surgeon: Irving Copas., MD;  Location: [facility];  Service: Gastroenterology;  Laterality: N/A;   hydrocoelectomy  2003   Dr Jeffie Pollock   KNEE ARTHROSCOPY Left 1994   OSTOMY N/A 06/21/2020   Procedure: POSSIBLE OSTOMY;  Surgeon: Michael Boston, MD;  Location: [facility];  Service: General;  Laterality: N/A;   POLYPECTOMY  05/24/2020   Procedure: POLYPECTOMY;  Surgeon: Irving Copas., MD;  Location[facility] ENDOSCOPY;  Service: Gastroenterology;;   PROCTOSCOPY N/A 06/21/2020   Procedure: RIGID PROCTOSCOPY;  Surgeon: Michael Boston, MD;  Location: [facility];  Service: General;  Laterality: N/A;   prostate nodule resection  2003   TONSILLECTOMY     TOTAL KNEE ARTHROPLASTY  2009   TOTAL KNEE ARTHROPLASTY Right 06/27/2016   Procedure: TOTAL KNEE ARTHROPLASTY;  Surgeon: Leandrew Koyanagi, MD;  Location: [facility];  Service: Orthopedics;  Laterality: Right;   XI ROBOTIC ASSISTED LOWER ANTERIOR RESECTION N/A 06/21/2020    Procedure: XI ROBOTIC ASSISTED LOWER ANTERIOR RECTOSIGMOID RESECTION, BILATERAL TAP BLOCK;  Surgeon: Michael Boston, MD;  Location: [facility];  Service: General;  Laterality: N/A;   Family History  Problem Relation Age of Onset   Heart disease Mother    Lung cancer Father  smoker   Alcohol abuse Father    Tuberculosis Father    Heart disease Father    Early death Father    Heart attack Brother    Diabetes Other    Stroke Neg Hx    Thyroid disease Neg Hx    Colon cancer Neg Hx    Stomach cancer Neg Hx    Rectal cancer Neg Hx    Esophageal cancer Neg Hx    Inflammatory bowel disease Neg Hx    Liver disease Neg Hx    Pancreatic cancer Neg Hx    Colon polyps Neg Hx    Social History   Social History Narrative   Married. Wife's name is [name]. Full time employed, Pensions consultant.   Drinks caffeinated beverages. Take a multivitamin. Wears his seatbelt.   Exercises at least 3 times a week. Is not a strict vegetarian, but only eats meat 1-2 times a week   Wears a hearing aid. Does not wear dentures or use an assistive walking device.   There is a smoke detector in his home.   He feels safe in his relationships.    Allergies as of 05/22/2022       Reactions   Morphine And Related Other (See Comments)   Severe drop in blood pressure   Sertraline Hcl Other (See Comments)   Headache        Medication List        Accurate as of May 22, 2022 10:38 AM. If you have any questions, ask your nurse or doctor.          amLODipine 10 MG tablet Commonly known as: NORVASC Take 1 tablet (10 mg total) by mouth daily.   atorvastatin 40 MG tablet Commonly known as: LIPITOR Take 1 tablet (40 mg total) by mouth at bedtime.   b complex vitamins tablet Take 1 tablet by mouth every other day.   lisinopril 10 MG tablet Commonly known as: ZESTRIL Take 1 tablet (10 mg total) by mouth daily.   Multiple Vitamin tablet Take 1 tablet by mouth daily.       All past  medical history, surgical history, allergies, family history, immunizations andmedications were updated in the EMR today and reviewed under the history and medication portions of their EMR.     No results found for this or any previous visit (from the past 2160 hour(s)).   No results found.  ROS: 14 pt review of systems performed and negative (unless mentioned in an HPI)  Objective: BP (!) 148/78   Pulse (!) 57   Temp 98.1 F (36.7 C)   Ht 5' 8.7" (1.745 m)   Wt 184 lb 12.8 oz (83.8 kg)   SpO2 97%   BMI 27.53 kg/m  Physical Exam Vitals and nursing note reviewed.  Constitutional:      General: He is not in acute distress.    Appearance: Normal appearance. He is not ill-appearing, toxic-appearing or diaphoretic.  HENT:     Head: Normocephalic and atraumatic.  Eyes:     General: No scleral icterus.       Right eye: No discharge.        Left eye: No discharge.     Extraocular Movements: Extraocular movements intact.     Pupils: Pupils are equal, round, and reactive to light.  Cardiovascular:     Rate and Rhythm: Normal rate and regular rhythm.  Pulmonary:     Effort: Pulmonary effort is normal.     Breath  sounds: Normal breath sounds. No rhonchi or rales.  Musculoskeletal:     Cervical back: Neck supple.     Right lower leg: No edema.     Left lower leg: No edema.  Lymphadenopathy:     Cervical: No cervical adenopathy.  Skin:    General: Skin is warm and dry.     Findings: No rash.  Neurological:     Mental Status: He is alert and oriented to person, place, and time. Mental status is at baseline.  Psychiatric:        Mood and Affect: Mood normal.        Behavior: Behavior normal.        Thought Content: Thought content normal.        Judgment: Judgment normal.     No results found.  Assessment/plan: QUIENTIN JENT is a 77 y.o. male present for [facility]/CPE Essential hypertension, benign/Atherosclerosis of aorta (HCC)/Overweight (BMI 25.0-29.9)/Mixed  hyperlipidemia/Statin declined/Atherosclerosis of aorta (HCC) Lengthy conversation today with patient and his wife around cardiovascular health and risks.   We discussed genetics plays a role in cardiovascular health.  It is not uncommon to eat healthy, exercise yet require blood pressure medications and cholesterol medications to maintain cardiac health as we age.   Recommend he continue his dietary changes with a heart healthy diet.  He could still use lower sodium in his diet and he is aware working on this. Continue to exercise with the benchmark of 150 minutes/week. Continue amlodipine 10 mg qd Continue lisinopril at 10 mg qd.-Discussed increasing dose today.  Patient reports he has just been really bad on his diet and he has not been exercising.  He would like to wait before increasing and allow himself time to get back on his normal regimen CV risk 30%> patient refuses cholesterol medications - CT CARDIAC(01/2022):29th % -Carotid stenosis stable - CBC with Differential/Platelet - TSH - Lipid panel - Comprehensive metabolic panel  Stenosis of right carotid artery - US Carotid Duplex Bilateral;(01/2022)> unchanged from 2020.  Offered referral back to cardiology to discuss in more detail and obtain their recommendations, he declined for now.  Immunization declined Prostate cancer screening - PSA Encounter for long-term (current) use of medications - Hemoglobin A1c Refusal of treatment for reasons of religion or conscience-hyperlipidemia  Routine general health exam/yearly preventative exam: Patient was encouraged to exercise greater than 150 minutes a week. Patient was encouraged to choose a diet filled with fresh fruits and vegetables, and lean meats. AVS provided to patient today for education/recommendation on gender specific health and safety maintenance. Colonoscopy: last screen 03/2020. Rectal cancer s/p robotic LAR 06/2020.Nathan [name] Has colonoscopy scheduled in Nov. 2022.   Immunizations:  tdap 2014 UTD, influenza declines, PNA series declines, zostavax declines, covid declines Infectious disease screening:Hep C declines. PSA: Followed by urology, last appt was over a year ago.  No follow-ups on file.   Orders Placed This Encounter  Procedures   CBC with Differential/Platelet   PSA   TSH   Lipid panel   Hemoglobin A1c   Comprehensive metabolic panel   No orders of the defined types were placed in this encounter.  Referral Orders  No referral(s) requested today     Note is dictated utilizing voice recognition software. Although note has been proof read prior to signing, occasional typographical errors still can be missed. If any questions arise, please do not hesitate to call for verification.  Electronically signed by: Howard Pouch, DO [place]

## 2022-05-22 NOTE — Patient Instructions (Addendum)
Return in about 24 weeks (around 11/06/2022) for Routine chronic condition follow-up.        Great to see you today.  I have refilled the medication(s) we provide.   If labs were collected, we will inform you of lab results once received either by echart message or telephone call.   - echart message- for normal results that have been seen by the patient already.   - telephone call: abnormal results or if patient has not viewed results in their echart. Health Maintenance After Age 73 After age 80, you are at a higher risk for certain long-term diseases and infections as well as injuries from falls. Falls are a major cause of broken bones and head injuries in people who are older than age 59. Getting regular preventive care can help to keep you healthy and well. Preventive care includes getting regular testing and making lifestyle changes as recommended by your health care provider. Talk with your health care provider about: Which screenings and tests you should have. A screening is a test that checks for a disease when you have no symptoms. A diet and exercise plan that is right for you. What should I know about screenings and tests to prevent falls? Screening and testing are the best ways to find a health problem early. Early diagnosis and treatment give you the best chance of managing medical conditions that are common after age 30. Certain conditions and lifestyle choices may make you more likely to have a fall. Your health care provider may recommend: Regular vision checks. Poor vision and conditions such as cataracts can make you more likely to have a fall. If you wear glasses, make sure to get your prescription updated if your vision changes. Medicine review. Work with your health care provider to regularly review all of the medicines you are taking, including over-the-counter medicines. Ask your health care provider about any side effects that may make you more likely to have a fall. Tell  your health care provider if any medicines that you take make you feel dizzy or sleepy. Strength and balance checks. Your health care provider may recommend certain tests to check your strength and balance while standing, walking, or changing positions. Foot health exam. Foot pain and numbness, as well as not wearing proper footwear, can make you more likely to have a fall. Screenings, including: Osteoporosis screening. Osteoporosis is a condition that causes the bones to get weaker and break more easily. Blood pressure screening. Blood pressure changes and medicines to control blood pressure can make you feel dizzy. Depression screening. You may be more likely to have a fall if you have a fear of falling, feel depressed, or feel unable to do activities that you used to do. Alcohol use screening. Using too much alcohol can affect your balance and may make you more likely to have a fall. Follow these instructions at home: Lifestyle Do not drink alcohol if: Your health care provider tells you not to drink. If you drink alcohol: Limit how much you have to: 0-1 drink a day for women. 0-2 drinks a day for men. Know how much alcohol is in your drink. In the [place] one drink equals one 12 oz bottle of beer (355 mL), one 5 oz glass of wine (148 mL), or one 1 oz glass of hard liquor (44 mL). Do not use any products that contain nicotine or tobacco. These products include cigarettes, chewing tobacco, and vaping devices, such as e-cigarettes. If you need help quitting, ask  your health care provider. Activity  Follow a regular exercise program to stay fit. This will help you maintain your balance. Ask your health care provider what types of exercise are appropriate for you. If you need a cane or walker, use it as recommended by your health care provider. Wear supportive shoes that have nonskid soles. Safety  Remove any tripping hazards, such as rugs, cords, and clutter. Install safety equipment such as  grab bars in bathrooms and safety rails on stairs. Keep rooms and walkways well-lit. General instructions Talk with your health care provider about your risks for falling. Tell your health care provider if: You fall. Be sure to tell your health care provider about all falls, even ones that seem minor. You feel dizzy, tiredness (fatigue), or off-balance. Take over-the-counter and prescription medicines only as told by your health care provider. These include supplements. Eat a healthy diet and maintain a healthy weight. A healthy diet includes low-fat dairy products, low-fat (lean) meats, and fiber from whole grains, beans, and lots of fruits and vegetables. Stay current with your vaccines. Schedule regular health, dental, and eye exams. Summary Having a healthy lifestyle and getting preventive care can help to protect your health and wellness after age 73. Screening and testing are the best way to find a health problem early and help you avoid having a fall. Early diagnosis and treatment give you the best chance for managing medical conditions that are more common for people who are older than age 25. Falls are a major cause of broken bones and head injuries in people who are older than age 40. Take precautions to prevent a fall at home. Work with your health care provider to learn what changes you can make to improve your health and wellness and to prevent falls. This information is not intended to replace advice given to you by your health care provider. Make sure you discuss any questions you have with your health care provider. Document Revised: 11/20/2020 Document Reviewed: 11/20/2020 Elsevier Patient Education  [place].

## 2022-06-11 ENCOUNTER — Telehealth: Payer: Self-pay

## 2022-06-11 NOTE — Telephone Encounter (Signed)
LVM for pt to CB to sched [place].  Return in about 24 weeks (around 11/06/2022) for Routine chronic condition follow-up

## 2022-10-08 ENCOUNTER — Telehealth: Payer: Self-pay

## 2022-10-08 ENCOUNTER — Ambulatory Visit: Payer: Medicare Other | Admitting: Family Medicine

## 2022-10-08 NOTE — Telephone Encounter (Signed)
Pt scheduled for next day appt   [facility] - Client TELEPHONE ADVICE RECORD [facility] Patient Name: Nathan [name] Gender: Male DOB: 03-Jul-1946 Age: 77 Y 9 M 18 D Return Phone Number: [phone] (Primary) Address: City/ State/ Zip: [place]  [address] Client [place] [facility] - Client Client Site [facility] - Day Provider Raoul Pitch, [employer] Type Call Who Is Calling Patient / Member / Family / Caregiver Call Type Triage / Clinical Relationship To Patient Self Return Phone Number [phone] (Primary) Chief Complaint Dizziness Reason for Call Symptomatic / Request for Health Information Initial Comment He has dizziness Translation No Nurse Assessment Nurse: Nathan Barry, RN, Nathan [name] Date/Time Nathan [name] Time): 10/08/2022 10:37:17 AM Confirm and document reason for call. If symptomatic, describe symptoms. ---Caller advised that he is having dizziness. Has a hx of this sx. MRI, has a main artery clogged. Having periods of lightheadedness. Feels like the room is spinning. Does the patient have any new or worsening symptoms? ---Yes Will a triage be completed? ---Yes Related visit to physician within the last 2 weeks? ---No Does the PT have any chronic conditions? (i.e. diabetes, asthma, this includes High risk factors for pregnancy, etc.) ---Yes List chronic conditions. ---HTN Is this a behavioral health or substance abuse call? ---No Guidelines Guideline Title Affirmed Question Affirmed Notes Nurse Date/Time (Eastern Time) Dizziness - Vertigo [1] MODERATE dizziness (e.g., vertigo; feels very unsteady, interferes with normal activities) AND [2] has NOT been evaluated by doctor (or NP/PA) for this Nathan Barry, RN, Nathan [name] 10/08/2022 10:38:59 AM PLEASE NOTE: All timestamps contained within this report are represented as [place] Standard Time. CONFIDENTIALTY NOTICE: This fax transmission is intended only for  the addressee. It contains information that is legally privileged, confidential or otherwise protected from use or disclosure. If you are not the intended recipient, you are strictly prohibited from reviewing, disclosing, copying using or disseminating any of this information or taking any action in reliance on or regarding this information. If you have received this fax in error, please notify us immediately by telephone so that we can arrange for its return to [place]. Phone: [phone], Toll-Free: [phone], Fax: [phone] Page: 2 of 2 Call Id: [id number] [place]. Time Nathan [name] Time) Disposition Final User 10/08/2022 10:41:22 AM See PCP within 24 Hours Yes Deaton, RN, Nathan [name] Final Disposition 10/08/2022 10:41:22 AM See PCP within 24 Hours Yes Deaton, RN, Nathan [name] Disagree/Comply Comply Caller Understands Yes PreDisposition Did not know what to do Care Advice Given Per Guideline SEE PCP WITHIN 24 HOURS: * IF OFFICE WILL BE OPEN: You need to be examined within the next 24 hours. Call your doctor (or NP/PA) when the office opens and make an appointment. CALL BACK IF: * Severe headache occurs * Weakness develops in an arm or leg * Unable to walk without falling * You become worse CARE ADVICE given per Dizziness - Vertigo (Adult) guideline. Comments User: Nathan Danker, RN Date/Time Nathan [name] Time): 10/08/2022 10:44:38 AM Spoke with Nathan [name] at the backline, appt at 3:oopm was already scheduled, got the appt moved up to 9:45 tomorrow. Referrals REFERRED TO PCP OFFICE

## 2022-10-09 ENCOUNTER — Ambulatory Visit: Payer: Medicare Other | Admitting: Family Medicine

## 2022-10-09 ENCOUNTER — Encounter: Payer: Self-pay | Admitting: Family Medicine

## 2022-10-09 ENCOUNTER — Ambulatory Visit (INDEPENDENT_AMBULATORY_CARE_PROVIDER_SITE_OTHER): Payer: Medicare Other | Admitting: Family Medicine

## 2022-10-09 ENCOUNTER — Ambulatory Visit (HOSPITAL_COMMUNITY)
Admission: RE | Admit: 2022-10-09 | Discharge: 2022-10-09 | Disposition: A | Discharge status: Home / Self Care | Payer: Medicare Other | Source: Ambulatory Visit | Attending: Family Medicine | Admitting: Family Medicine

## 2022-10-09 VITALS — BP 132/75 | HR 57 | Temp 98.0°F | Wt 187.2 lb

## 2022-10-09 DIAGNOSIS — S0990XA Unspecified injury of head, initial encounter: Secondary | ICD-10-CM

## 2022-10-09 DIAGNOSIS — R61 Generalized hyperhidrosis: Secondary | ICD-10-CM

## 2022-10-09 DIAGNOSIS — R42 Dizziness and giddiness: Secondary | ICD-10-CM

## 2022-10-09 DIAGNOSIS — S199XXA Unspecified injury of neck, initial encounter: Secondary | ICD-10-CM

## 2022-10-09 DIAGNOSIS — W19XXXA Unspecified fall, initial encounter: Secondary | ICD-10-CM | POA: Diagnosis not present

## 2022-10-09 DIAGNOSIS — R079 Chest pain, unspecified: Secondary | ICD-10-CM

## 2022-10-09 DIAGNOSIS — R002 Palpitations: Secondary | ICD-10-CM

## 2022-10-09 LAB — CBC WITH DIFFERENTIAL/PLATELET
Basophils Absolute: 0.1 10*3/uL (ref 0.0–0.1)
Basophils Relative: 0.7 % (ref 0.0–3.0)
Eosinophils Absolute: 0.1 10*3/uL (ref 0.0–0.7)
Eosinophils Relative: 1.9 % (ref 0.0–5.0)
HCT: 41.4 % (ref 39.0–52.0)
Hemoglobin: 13.9 g/dL (ref 13.0–17.0)
Lymphocytes Relative: 30.7 % (ref 12.0–46.0)
Lymphs Abs: 2.2 10*3/uL (ref 0.7–4.0)
MCHC: 33.5 g/dL (ref 30.0–36.0)
MCV: 91.4 fl (ref 78.0–100.0)
Monocytes Absolute: 0.5 10*3/uL (ref 0.1–1.0)
Monocytes Relative: 7.1 % (ref 3.0–12.0)
Neutro Abs: 4.3 10*3/uL (ref 1.4–7.7)
Neutrophils Relative %: 59.6 % (ref 43.0–77.0)
Platelets: 276 10*3/uL (ref 150.0–400.0)
RBC: 4.53 Mil/uL (ref 4.22–5.81)
RDW: 14.5 % (ref 11.5–15.5)
WBC: 7.2 10*3/uL (ref 4.0–10.5)

## 2022-10-09 LAB — COMPREHENSIVE METABOLIC PANEL
ALT: 12 U/L (ref 0–53)
AST: 17 U/L (ref 0–37)
Albumin: 4.2 g/dL (ref 3.5–5.2)
Alkaline Phosphatase: 57 U/L (ref 39–117)
BUN: 17 mg/dL (ref 6–23)
CO2: 29 mEq/L (ref 19–32)
Calcium: 9.2 mg/dL (ref 8.4–10.5)
Chloride: 103 mEq/L (ref 96–112)
Creatinine, Ser: 1.1 mg/dL (ref 0.40–1.50)
GFR: 65.07 mL/min (ref >60.00)
Glucose, Bld: 112 mg/dL — ABNORMAL HIGH (ref 70–99)
Potassium: 4.1 mEq/L (ref 3.5–5.1)
Sodium: 139 mEq/L (ref 135–145)
Total Bilirubin: 0.7 mg/dL (ref 0.2–1.2)
Total Protein: 6.5 g/dL (ref 6.0–8.3)

## 2022-10-09 LAB — TSH: TSH: 1.76 u[IU]/mL (ref 0.35–5.50)

## 2022-10-09 MED ORDER — GADOBUTROL 1 MMOL/ML IV SOLN
8.0000 mL | Freq: Once | INTRAVENOUS | Status: AC | PRN
  Administered 2022-10-09: 8 mL via INTRAVENOUS

## 2022-10-09 NOTE — Progress Notes (Addendum)
Nathan [name] , 12/23/1945, 77 y.o., male MRN: [id number] Patient Care Team    Relationship Specialty Notifications Start End  Ma Hillock, DO PCP - General Family Medicine  12/07/15   Pa, [facility]    06/02/17   Leandrew Koyanagi, MD Attending Physician Orthopedic Surgery  06/02/17   Ladell Pier, MD Consulting Physician Oncology  05/29/20   Michael Boston, MD Consulting Physician General Surgery  06/19/20   Richardo Priest, MD Consulting Physician Cardiology  06/19/20   Armbruster, Carlota Raspberry, MD Consulting Physician Gastroenterology  06/26/20   Tobi Bastos, RN [facility]   03/22/22     Chief Complaint  Patient presents with   Dizziness    2 in the last 2 weeks; no specific time; first episode pt states that he began sweating; last one was on Monday; fell a few weeks ago and hit head "pretty hard"     Subjective: Nathan [name] is a 77 y.o. Pt presents for an OV with complaints of recurrent dizziness.  He states he has had recurrent dizziness and feeling unstable at times with his gait.  Feels his equilibrium can be off at times.  In January he had a significant fall in which he reports a mechanical fall after slipping/tripping over a rock.  He states he hit his head "pretty hard, "on the ground which was dark rocks and leaves.  He did not sustain any skin injury or bruising that he noted during that time.  He has noticed a persistent dull headache over the crown of his scalp since that time.  He has never had headaches before.   Reports 2 episodes of significant dizziness.  First episode he was in his car, parked, and he had a episode of dizziness which she describes as the room moving and diaphoresis.  The episode lasted about 10 minutes and then slowly resolved.  Second episode occurred earlier this week, without diaphoresis and was shorter in length. His wife also mentions a few weeks ago he had woken with chest pounding  discomfort.  He states his heart was racing in his chest.  He reports that lasted all night for him.  He went out on laid on the couch and took his blood pressure medications. Has a significant medical history of atherosclerosis, right carotid artery stenosis, hypertension, hyperlipidemia.  He has declined treatment options for his hyperlipidemia.  He has also had a history of rectal cancer.  Carotid artery stenosis 50 to 69% 01/14/2022. Coronary calcium score of 88, 29th percentile for age. 01/2022     05/22/2022   10:31 AM 03/22/2022    2:48 PM 10/24/2021    8:20 AM 04/12/2021    8:30 AM 09/20/2020    9:13 AM  Depression screen PHQ 2/9  Decreased Interest 1 0 0 0 0  Down, Depressed, Hopeless  0 1 0 0  PHQ - 2 Score 1 0 1 0 0  Altered sleeping 3      Tired, decreased energy 3      Change in appetite 0      Feeling bad or failure about yourself  0      Trouble concentrating 0      Moving slowly or fidgety/restless 0      Suicidal thoughts 0      PHQ-9 Score 7        Allergies  Allergen Reactions   Morphine And Related Other (See  Comments)    Severe drop in blood pressure   Sertraline Hcl Other (See Comments)    Headache   Social History   Social History Narrative   Married. Wife's name is [name]. Full time employed, Pensions consultant.   Drinks caffeinated beverages. Take a multivitamin. Wears his seatbelt.   Exercises at least 3 times a week. Is not a strict vegetarian, but only eats meat 1-2 times a week   Wears a hearing aid. Does not wear dentures or use an assistive walking device.   There is a smoke detector in his home.   He feels safe in his relationships.   Past Medical History:  Diagnosis Date   Adrenal mass ([place]) 2018   Urology fully evaluated; benign   Allergy    hay fever   Anemia    resolved spontaneously   Anxiety    Cataract    Cervicalgia    Complication of anesthesia    hypoglycemia, low blood pressure, pt. reports it dropped to zero- post op  MORPHINE , then taken ICU due to either the morphine or hypoglycemia . In addition post op from cataract surgery- 07/2015, experienced an episode of hypoglycemia     Depressive disorder, not elsewhere classified    Diverticulosis    Elevated prostate specific antigen (PSA) 2008   Dr Jeffie Pollock   Fecal occult blood test positive 02/07/2020   Gallstone    GERD (gastroesophageal reflux disease)    H/O cardiovascular stress test >10 yrs.   told that its wnl. Told to drink less coffee   Hematuria 2018   Hematuria with enlarged prostate, kidney cyst, adrenal adenoma--> followed by urology with cystoscopy and repeat CT, benign workup.   Hx of migraines    Hypertension    Hyperthyroidism    Treated with medication no longer needed at this time   Hypoglycemia    IBS (irritable bowel syndrome)    patient not aware   Inguinal hernia 12/2015   Bilateral small indirect hernias on CT, umbilical hernia also present.   Kidney cyst, acquired 2018   Followed by urology, benign.   Low back pain    Macular degeneration (senile) of retina, unspecified    2 different opinions from 2 different Ophth   Osteoarthrosis, unspecified whether generalized or localized, unspecified site    Other and unspecified hyperlipidemia    Prostatitis 2008   Rectal cancer ([place])    Refusal of blood transfusions as patient is Jehovah's Witness    Segmental and somatic dysfunction of cervical region    Segmental and somatic dysfunction of lumbar region    Segmental and somatic dysfunction of sacral region    Segmental and somatic dysfunction of thoracic region    Spondylosis of lumbar spine    Strain of pelvis    Tuberculosis    had exposure as child tests positive on skin test.   Past Surgical History:  Procedure Laterality Date   BIOPSY  05/24/2020   Procedure: BIOPSY;  Surgeon: Irving Copas., MD;  Location: [facility] ENDOSCOPY;  Service: Gastroenterology;;   CATARACT EXTRACTION, BILATERAL     COLONOSCOPY  2012   Dr  Olevia Perches   COLONOSCOPY WITH PROPOFOL N/A 05/24/2020   Procedure: COLONOSCOPY WITH PROPOFOL;  Surgeon: Irving Copas., MD;  Location: [facility];  Service: Gastroenterology;  Laterality: N/A;   CYSTOSCOPY     for hematuria   EUS N/A 05/24/2020   Procedure: LOWER ENDOSCOPIC ULTRASOUND (EUS);  Surgeon: Irving Copas., MD;  Location:  [facility] ENDOSCOPY;  Service: Gastroenterology;  Laterality: N/A;   hydrocoelectomy  2003   Dr Jeffie Pollock   KNEE ARTHROSCOPY Left 1994   OSTOMY N/A 06/21/2020   Procedure: POSSIBLE OSTOMY;  Surgeon: Michael Boston, MD;  Location: [facility];  Service: General;  Laterality: N/A;   POLYPECTOMY  05/24/2020   Procedure: POLYPECTOMY;  Surgeon: Irving Copas., MD;  Location: [facility] ENDOSCOPY;  Service: Gastroenterology;;   PROCTOSCOPY N/A 06/21/2020   Procedure: RIGID PROCTOSCOPY;  Surgeon: Michael Boston, MD;  Location: [facility];  Service: General;  Laterality: N/A;   prostate nodule resection  2003   TONSILLECTOMY     TOTAL KNEE ARTHROPLASTY  2009   TOTAL KNEE ARTHROPLASTY Right 06/27/2016   Procedure: TOTAL KNEE ARTHROPLASTY;  Surgeon: Leandrew Koyanagi, MD;  Location: [facility];  Service: Orthopedics;  Laterality: Right;   XI ROBOTIC ASSISTED LOWER ANTERIOR RESECTION N/A 06/21/2020   Procedure: XI ROBOTIC ASSISTED LOWER ANTERIOR RECTOSIGMOID RESECTION, BILATERAL TAP BLOCK;  Surgeon: Michael Boston, MD;  Location: [facility];  Service: General;  Laterality: N/A;   Family History  Problem Relation Age of Onset   Heart disease Mother    Lung cancer Father        smoker   Alcohol abuse Father    Tuberculosis Father    Heart disease Father    Early death Father    Heart attack Brother    Diabetes Other    Stroke Neg Hx    Thyroid disease Neg Hx    Colon cancer Neg Hx    Stomach cancer Neg Hx    Rectal cancer Neg Hx    Esophageal cancer Neg Hx    Inflammatory bowel disease Neg Hx    Liver disease Neg Hx    Pancreatic cancer Neg Hx    Colon polyps Neg Hx    Allergies  as of 10/09/2022       Reactions   Morphine And Related Other (See Comments)   Severe drop in blood pressure   Sertraline Hcl Other (See Comments)   Headache        Medication List        Accurate as of October 09, 2022 12:35 PM. If you have any questions, ask your nurse or doctor.          amLODipine 10 MG tablet Commonly known as: NORVASC Take 1 tablet (10 mg total) by mouth daily.   b complex vitamins tablet Take 1 tablet by mouth every other day.   K2 PO Take by mouth.   KRILL OIL PO Take by mouth.   lisinopril 10 MG tablet Commonly known as: ZESTRIL Take 1 tablet (10 mg total) by mouth daily.   Multiple Vitamin tablet Take 1 tablet by mouth daily.        All past medical history, surgical history, allergies, family history, immunizations andmedications were updated in the EMR today and reviewed under the history and medication portions of their EMR.     Review of Systems  Constitutional:  Positive for diaphoresis.  Eyes: Negative.   Respiratory: Negative.    Cardiovascular:  Positive for palpitations.  Gastrointestinal: Negative.   Genitourinary: Negative.   Musculoskeletal:  Positive for falls.  Neurological:  Positive for dizziness and headaches.   Negative, with the exception of above mentioned in HPI   Objective:  BP 132/75   Pulse (!) 57   Temp 98 F (36.7 C)   Wt 187 lb 3.2 oz (84.9 kg)   SpO2 98%  BMI 27.89 kg/m  Body mass index is 27.89 kg/m. Physical Exam Vitals and nursing note reviewed.  Constitutional:      General: He is not in acute distress.    Appearance: Normal appearance. He is not ill-appearing, toxic-appearing or diaphoretic.  HENT:     Head: Normocephalic and atraumatic.     Ears:     Comments: Hearing aids in place. Eyes:     General: No scleral icterus.       Right eye: No discharge.        Left eye: No discharge.     Extraocular Movements: Extraocular movements intact.     Conjunctiva/sclera: Conjunctivae  normal.     Pupils: Pupils are equal, round, and reactive to light.  Cardiovascular:     Rate and Rhythm: Normal rate and regular rhythm.     Heart sounds: No murmur heard. Pulmonary:     Effort: Pulmonary effort is normal. No respiratory distress.     Breath sounds: Normal breath sounds. No wheezing, rhonchi or rales.  Musculoskeletal:     Right lower leg: No edema.     Left lower leg: No edema.  Skin:    General: Skin is warm.     Findings: No bruising, lesion or rash.     Comments: Scalp/skin intact.  Neurological:     Mental Status: He is alert and oriented to person, place, and time. Mental status is at baseline.     Cranial Nerves: No cranial nerve deficit.     Motor: No weakness.     Coordination: Coordination normal.     Gait: Gait normal.  Psychiatric:        Mood and Affect: Mood normal.        Behavior: Behavior normal.        Thought Content: Thought content normal.        Judgment: Judgment normal.   EKG: Sinus bradycardia within normal limits.  Heart rate 59, PR 172, QTc 419.  No ST elevation.  No T wave changes  No results found. No results found. No results found for this or any previous visit (from the past 24 hour(s)).  Assessment/Plan: Nathan [name] is a 77 y.o. male present for OV for  Dizziness Recurrent dizziness since fall that occurred a little over 2 months ago.  He has had recurrent dizziness and headaches since that fall.  He did not get seen at the time of the fall. - EKG 12-Lead-mild bradycardia, WNL.  No changes from prior EKGs.  We discussed referral to cardiology for further evaluation.  Cannot rule out arrhythmia causing dizziness.  Patient is agreeable - MR Brain W Wo Contrast; Future - CBC w/Diff - Comp Met (CMET) - TSH - Ambulatory referral to Cardiology - MR [place]; Future  Diaphoresis/Chest pain, unspecified type/Pounding heartbeat Cannot rule out patient is having ischemic changes of heart causing symptoms reported  above and dizziness.  He agrees to reestablish with cardiology for evaluation today. EKG sinus rhythm.  No changes from prior EKGs. He has a history of non-compliant with medications - Ambulatory referral to Cardiology  Fall/ Traumatic injury of head, initial encounter/ Headache due to injury of head and neck - MR Brain W Wo Contrast; Future - CBC w/Diff - Comp Met (CMET) - TSH - MR BRAIN W WO CONTRAST; Future Consider neurology referral after MRI results received.   Reviewed expectations re: course of current medical issues. Discussed self-management of symptoms. Outlined signs and symptoms indicating  need for more acute intervention. Patient verbalized understanding and all questions were answered. Patient received an After-Visit Summary.  67 minutes was spent on patient encounter on the day of service for urgent encounter surrounding possible head trauma and cardiovascular event.  Time spent with patient does  not include time spent on performing any separately reported services billed on the same day.   Orders Placed This Encounter  Procedures   MR Brain W Wo Contrast   MR BRAIN W WO CONTRAST   CBC w/Diff   Comp Met (CMET)   TSH   Ambulatory referral to Cardiology   EKG 12-Lead   No orders of the defined types were placed in this encounter.  Referral Orders         Ambulatory referral to Cardiology       Note is dictated utilizing voice recognition software. Although note has been proof read prior to signing, occasional typographical errors still can be missed. If any questions arise, please do not hesitate to call for verification.   electronically signed by:  Felix Pacini, DO  [facility] - [facility]

## 2022-10-09 NOTE — Patient Instructions (Signed)
No follow-ups on file.        Great to see you today.  I have refilled the medication(s) we provide.   If labs were collected, we will inform you of lab results once received either by echart message or telephone call.   - echart message- for normal results that have been seen by the patient already.   - telephone call: abnormal results or if patient has not viewed results in their echart.  

## 2022-10-15 NOTE — Progress Notes (Unsigned)
Cardiology Office Note   Date:  10/17/2022   ID:  Nathan [name], DOB Aug 31, 1945, MRN [id number]  PCP:  Ma Hillock, DO  Cardiologist:   None Referring:  Ma Hillock, DO  Chief Complaint  Patient presents with   Dizziness     History of Present Illness: Nathan [name] is a 77 y.o. male who presents for evaluation of palpitations.    He was seen in 2020 for syncope.  He had elevated coronary calcium.   He had an echo which was unremarkable.  He had a negative perfusion study.  He wore a monitor and had short runs of SVT.  He did have an emergency room around that time and I reviewed these records for that visit.  He says that he has had a couple of episodes of palpitations.  He had dizziness while sitting in a car.  He saw things spinning and felt like it was moving.  This was a few weeks ago.  The second happened about 2 to 3 weeks ago.  This lasted for few minutes.  Because he had had some traumatic injury did have an MRI of his brain that was negative.  He has had some episodes of tachypalpitations that wake him from sleep at night.  He has not had any syncope.  He denies any orthostatic symptoms.  He had no chest pressure, neck or arm discomfort.  He golfs.  He goes to the gym and does cardio and cannot bring on any symptoms.  He has no new shortness of breath, PND or orthopnea.  He had no weight gain or edema.   Past Medical History:  Diagnosis Date   Adrenal mass 2018   Urology fully evaluated; benign   Allergy    hay fever   Anemia    resolved spontaneously   Anxiety    Cataract    Cervicalgia    Complication of anesthesia    hypoglycemia, low blood pressure, pt. reports it dropped to zero- post op MORPHINE , then taken ICU due to either the morphine or hypoglycemia . In addition post op from cataract surgery- 07/2015, experienced an episode of hypoglycemia     Depressive disorder, not elsewhere classified    Diverticulosis    Elevated prostate specific antigen  (PSA) 2008   Dr Jeffie Pollock   Fecal occult blood test positive 02/07/2020   Gallstone    GERD (gastroesophageal reflux disease)    H/O cardiovascular stress test >10 yrs.   told that its wnl. Told to drink less coffee   Hematuria 2018   Hematuria with enlarged prostate, kidney cyst, adrenal adenoma--> followed by urology with cystoscopy and repeat CT, benign workup.   Hx of migraines    Hypertension    Hyperthyroidism    Treated with medication no longer needed at this time   Hypoglycemia    IBS (irritable bowel syndrome)    patient not aware   Inguinal hernia 12/2015   Bilateral small indirect hernias on CT, umbilical hernia also present.   Kidney cyst, acquired 2018   Followed by urology, benign.   Low back pain    Macular degeneration (senile) of retina, unspecified    2 different opinions from 2 different Ophth   Osteoarthrosis, unspecified whether generalized or localized, unspecified site    Other and unspecified hyperlipidemia    Prostatitis 2008   Rectal cancer    Refusal of blood transfusions as patient is Jehovah's Witness    Segmental and  somatic dysfunction of cervical region    Segmental and somatic dysfunction of lumbar region    Segmental and somatic dysfunction of sacral region    Segmental and somatic dysfunction of thoracic region    Spondylosis of lumbar spine    Strain of pelvis    Tuberculosis    had exposure as child tests positive on skin test.    Past Surgical History:  Procedure Laterality Date   BIOPSY  05/24/2020   Procedure: BIOPSY;  Surgeon: Irving Copas., MD;  Location: [facility] ENDOSCOPY;  Service: Gastroenterology;;   CATARACT EXTRACTION, BILATERAL     COLONOSCOPY  2012   Dr Olevia Perches   COLONOSCOPY WITH PROPOFOL N/A 05/24/2020   Procedure: COLONOSCOPY WITH PROPOFOL;  Surgeon: Irving Copas., MD;  Location: [place];  Service: Gastroenterology;  Laterality: N/A;   CYSTOSCOPY     for hematuria   EUS N/A 05/24/2020   Procedure:  LOWER ENDOSCOPIC ULTRASOUND (EUS);  Surgeon: Irving Copas., MD;  Location: [place];  Service: Gastroenterology;  Laterality: N/A;   hydrocoelectomy  2003   Dr Jeffie Pollock   KNEE ARTHROSCOPY Left 1994   OSTOMY N/A 06/21/2020   Procedure: POSSIBLE OSTOMY;  Surgeon: Michael Boston, MD;  Location: [facility];  Service: General;  Laterality: N/A;   POLYPECTOMY  05/24/2020   Procedure: POLYPECTOMY;  Surgeon: Irving Copas., MD;  Location: [facility] ENDOSCOPY;  Service: Gastroenterology;;   PROCTOSCOPY N/A 06/21/2020   Procedure: RIGID PROCTOSCOPY;  Surgeon: Michael Boston, MD;  Location: [facility];  Service: General;  Laterality: N/A;   prostate nodule resection  2003   TONSILLECTOMY     TOTAL KNEE ARTHROPLASTY  2009   TOTAL KNEE ARTHROPLASTY Right 06/27/2016   Procedure: TOTAL KNEE ARTHROPLASTY;  Surgeon: Leandrew Koyanagi, MD;  Location: [place];  Service: Orthopedics;  Laterality: Right;   XI ROBOTIC ASSISTED LOWER ANTERIOR RESECTION N/A 06/21/2020   Procedure: XI ROBOTIC ASSISTED LOWER ANTERIOR RECTOSIGMOID RESECTION, BILATERAL TAP BLOCK;  Surgeon: Michael Boston, MD;  Location: [facility];  Service: General;  Laterality: N/A;     Current Outpatient Medications  Medication Sig Dispense Refill   amLODipine (NORVASC) 10 MG tablet Take 1 tablet (10 mg total) by mouth daily. 90 tablet 1   b complex vitamins tablet Take 1 tablet by mouth every other day.     KRILL OIL PO Take by mouth.     lisinopril (ZESTRIL) 10 MG tablet Take 1 tablet (10 mg total) by mouth daily. 90 tablet 1   Menaquinone-7 (K2 PO) Take by mouth.     Multiple Vitamin tablet Take 1 tablet by mouth daily.      rosuvastatin (CRESTOR) 20 MG tablet Take 1 tablet (20 mg total) by mouth daily. 90 tablet 3   No current facility-administered medications for this visit.    Allergies:   Morphine and related and Sertraline hcl    Social History:  The patient  reports that he quit smoking about 54 years ago. His smoking use included  cigarettes. He has never used smokeless tobacco. He reports current alcohol use. He reports that he does not use drugs.   Family History:  The patient's family history includes Alcohol abuse in his father; Dementia in his mother; Diabetes in an other family member; Early death in his father; Heart attack (age of onset: 28) in his brother; Lung cancer in his father; Tuberculosis in his father.    ROS:  Please see the history of present illness.   Otherwise, review of  systems are positive for none.   All other systems are reviewed and negative.    PHYSICAL EXAM: VS:  BP (!) 140/74   Pulse 65   Ht 5' 10.5" (1.791 m)   Wt 188 lb (85.3 kg)   SpO2 98%   BMI 26.59 kg/m  , BMI Body mass index is 26.59 kg/m. GENERAL:  Well appearing HEENT:  Pupils equal round and reactive, fundi not visualized, oral mucosa unremarkable NECK:  No jugular venous distention, waveform within normal limits, carotid upstroke brisk and symmetric, no bruits, no thyromegaly LYMPHATICS:  No cervical, inguinal adenopathy LUNGS:  Clear to auscultation bilaterally BACK:  No CVA tenderness CHEST:  Unremarkable HEART:  PMI not displaced or sustained,S1 and S2 within normal limits, no S3, no S4, no clicks, no rubs, 2 out of 6 apical and axillary systolic murmur not necessarily holosystolic, no diastolic murmurs ABD:  Flat, positive bowel sounds normal in frequency in pitch, no bruits, no rebound, no guarding, no midline pulsatile mass, no hepatomegaly, no splenomegaly EXT:  2 plus pulses throughout, no edema, no cyanosis no clubbing SKIN:  No rashes no nodules NEURO:  Cranial nerves II through XII grossly intact, motor grossly intact throughout PSYCH:  Cognitively intact, oriented to person place and time    EKG:  EKG is ordered today. The ekg ordered today demonstrates sinus rhythm, rate 65, axis within normal limits, nonspecific T wave flattening.   Recent Labs: 10/09/2022: ALT 12; BUN 17; Creatinine, Ser 1.10;  Hemoglobin 13.9; Platelets 276.0; Potassium 4.1; Sodium 139; TSH 1.76    Lipid Panel    Component Value Date/Time   CHOL 286 (H) 05/22/2022 1047   TRIG 129.0 05/22/2022 1047   TRIG 75 07/02/2006 1146   HDL 69.00 05/22/2022 1047   CHOLHDL 4 05/22/2022 1047   VLDL 25.8 05/22/2022 1047   LDLCALC 191 (H) 05/22/2022 1047   LDLCALC 199 (H) 04/20/2018 1010   LDLDIRECT 192.0 12/18/2021 1113      Wt Readings from Last 3 Encounters:  10/17/22 188 lb (85.3 kg)  10/09/22 187 lb 3.2 oz (84.9 kg)  05/22/22 184 lb 12.8 oz (83.8 kg)      Other studies Reviewed: Additional studies/ records that were reviewed today include: Labs. Review of the above records demonstrates:  Please see elsewhere in the note.     ASSESSMENT AND PLAN:  Elevated coronary calcium: This will be addressed as below with risk reduction.  He has excellent exercise tolerance and no symptoms.  No further testing is indicated.  HTN: His blood pressure is well-controlled.  No change in therapy.  Dizziness: The etiology of this is not entirely clear although he had some palpitations.  He has some moderate carotid plaque.  My plan is to send him to [facility] to evaluate carotid plaque.  I like 4-week monitor to evaluate his palpitations and dizziness.  Dyslipidemia: LDL is 191.  He has coronary calcium.  My plan is Crestor 20 mg we had a long conversation about this.  He could check lipid profile and LPA in 3 months.  Murmur: I will check an echocardiogram.  This might be mitral regurgitation.   Current medicines are reviewed at length with the patient today.  The patient does not have concerns regarding medicines.  The following changes have been made: As above  Labs/ tests ordered today include:   Orders Placed This Encounter  Procedures   Lipid panel   Lipoprotein A (LPA)   Ambulatory referral to Vascular Surgery  CARDIAC EVENT MONITOR   EKG 12-Lead   ECHOCARDIOGRAM COMPLETE     Disposition:   FU with me in 8  weeks   Signed, Minus Breeding, MD  10/17/2022 9:54 AM    [place]

## 2022-10-17 ENCOUNTER — Encounter: Payer: Self-pay | Admitting: Cardiology

## 2022-10-17 ENCOUNTER — Encounter: Payer: Self-pay | Admitting: *Deleted

## 2022-10-17 ENCOUNTER — Ambulatory Visit: Payer: Medicare Other | Attending: Cardiology | Admitting: Cardiology

## 2022-10-17 VITALS — BP 140/74 | HR 65 | Ht 70.5 in | Wt 188.0 lb

## 2022-10-17 DIAGNOSIS — R42 Dizziness and giddiness: Secondary | ICD-10-CM

## 2022-10-17 DIAGNOSIS — I10 Essential (primary) hypertension: Secondary | ICD-10-CM

## 2022-10-17 DIAGNOSIS — E785 Hyperlipidemia, unspecified: Secondary | ICD-10-CM | POA: Diagnosis not present

## 2022-10-17 DIAGNOSIS — R011 Cardiac murmur, unspecified: Secondary | ICD-10-CM

## 2022-10-17 MED ORDER — ROSUVASTATIN CALCIUM 20 MG PO TABS: 20.0000 mg | ORAL_TABLET | Freq: Every day | ORAL | 3 refills | Status: DC

## 2022-10-17 NOTE — Progress Notes (Signed)
Patient enrolled for [employer]/ [employer] to ship a 30 day cardiac event monitor to his address on file. 

## 2022-10-17 NOTE — Patient Instructions (Signed)
Medication Instructions:  Your physician has recommended you make the following change in your medication:   -Start rosuvastatin (crestor) 20mg  once daily.  *If you need a refill on your cardiac medications before your next appointment, please call your pharmacy*   Lab Work: Your physician recommends that you return for lab work in: 3 months for [place]  If you have labs (blood work) drawn today and your tests are completely normal, you will receive your results only by: [place] (if you have MyChart) OR A paper copy in the mail If you have any lab test that is abnormal or we need to change your treatment, we will call you to review the results.   Testing/Procedures: Your physician has requested that you have an echocardiogram. Echocardiography is a painless test that uses sound waves to create images of your heart. It provides your doctor with information about the size and shape of your heart and how well your heart's chambers and valves are working. This procedure takes approximately one hour. There are no restrictions for this procedure. Please do NOT wear cologne, perfume, aftershave, or lotions (deodorant is allowed). Please arrive 15 minutes prior to your appointment time. This will be done at [address]   [employer] Cardiac Event Monitor Instructions  Your physician has requested you wear your cardiac event monitor for 30 days. [employer] may call or text to confirm a shipping address. The monitor will be sent to a land address via UPS. [employer] will not ship a monitor to a PO BOX. It typically takes 3-5 days to receive your monitor after it has been enrolled. [employer] will assist with USPS tracking if your package is delayed. The telephone number for [employer] is [phone]. Once you have received your monitor, please review the enclosed instructions. Instruction tutorials can also be viewed under help and settings on the enclosed  cell phone. Your monitor has already been registered assigning a specific monitor serial # to you.  Billing and Self Pay Discount Information  [employer] has been provided the insurance information we had on file for you.  If your insurance has been updated, please call [employer] at [phone] to provide them with your updated insurance information.   [employer] offers a discounted Self Pay option for patients who have insurance that does not cover their cardiac event monitor or patients without insurance.  The discounted cost of a Self Pay Cardiac Event Monitor would be $225.00 , if the patient contacts [employer] at [phone] within 7 days of applying the monitor to make payment arrangements.  If the patient does not contact [employer] within 7 days of applying the monitor, the cost of the cardiac event monitor will be $350.00.  Applying the monitor  Remove cell phone from case and turn it on. The cell phone works as Dealer and needs to be within [employer] of you at all times. The cell phone will need to be charged on a daily basis. We recommend you plug the cell phone into the enclosed charger at your bedside table every night.  Monitor batteries: You will receive two monitor batteries labelled #1 and #2. These are your recorders. Plug battery #2 onto the second connection on the enclosed charger. Keep one battery on the charger at all times. This will keep the monitor battery deactivated. It will also keep it fully charged for when you need to switch your monitor batteries. A small light will be blinking on the battery emblem when  it is charging. The light on the battery emblem will remain on when the battery is fully charged.  Open package of a Monitor strip. Insert battery #1 into black hood on strip and gently squeeze monitor battery onto connection as indicated in instruction booklet. Set aside while preparing skin.  Choose location for your strip, vertical or horizontal,  as indicated in the instruction booklet. Shave to remove all hair from location. There cannot be any lotions, oils, powders, or colognes on skin where monitor is to be applied. Wipe skin clean with enclosed Saline wipe. Dry skin completely.  Peel paper labeled #1 off the back of the Monitor strip exposing the adhesive. Place the monitor on the chest in the vertical or horizontal position shown in the instruction booklet. One arrow on the monitor strip must be pointing upward. Carefully remove paper labeled #2, attaching remainder of strip to your skin. Try not to create any folds or wrinkles in the strip as you apply it.  Firmly press and release the circle in the center of the monitor battery. You will hear a small beep. This is turning the monitor battery on. The heart emblem on the monitor battery will light up every 5 seconds if the monitor battery in turned on and connected to the patient securely. Do not push and hold the circle down as this turns the monitor battery off. The cell phone will locate the monitor battery. A screen will appear on the cell phone checking the connection of your monitor strip. This may read poor connection initially but change to good connection within the next minute. Once your monitor accepts the connection you will hear a series of 3 beeps followed by a climbing crescendo of beeps. A screen will appear on the cell phone showing the two monitor strip placement options. Touch the picture that demonstrates where you applied the monitor strip.  Your monitor strip and battery are waterproof. You are able to shower, bathe, or swim with the monitor on. They just ask you do not submerge deeper than 3 feet underwater. We recommend removing the monitor if you are swimming in a lake, river, or ocean.  Your monitor battery will need to be switched to a fully charged monitor battery approximately once a week. The cell phone will alert you of an action which needs to be  made.  On the cell phone, tap for details to reveal connection status, monitor battery status, and cell phone battery status. The green dots indicates your monitor is in good status. A red dot indicates there is something that needs your attention.  To record a symptom, click the circle on the monitor battery. In 30-60 seconds a list of symptoms will appear on the cell phone. Select your symptom and tap save. Your monitor will record a sustained or significant arrhythmia regardless of you clicking the button. Some patients do not feel the heart rhythm irregularities. [employer] will notify us of any serious or critical events.  Refer to instruction booklet for instructions on switching batteries, changing strips, the Do not disturb or Pause features, or any additional questions.  Call [employer] at [phone], to confirm your monitor is transmitting and record your baseline. They will answer any questions you may have regarding the monitor instructions at that time.  Returning the monitor to [place] all equipment back into blue box. Peel off strip of paper to expose adhesive and close box securely. There is a prepaid UPS shipping label on this box. Drop in  a UPS drop box, or at a UPS facility like [employer]. You may also contact [employer] to arrange UPS to pick up monitor package at your home.   Follow-Up: At [facility], you and your health needs are our priority.  As part of our continuing mission to provide you with exceptional heart care, we have created designated Provider Care Teams.  These Care Teams include your primary Cardiologist (physician) and Advanced Practice Providers (APPs -  Physician Assistants and Nurse Practitioners) who all work together to provide you with the care you need, when you need it.  We recommend signing up for the patient portal called "MyChart".  Sign up information is provided on this After Visit Summary.  MyChart is used to  connect with patients for Virtual Visits (Telemedicine).  Patients are able to view lab/test results, encounter notes, upcoming appointments, etc.  Non-urgent messages can be sent to your provider as well.   To learn more about what you can do with MyChart, go to [email or web address].    Your next appointment:   8 week(s)  Provider:   Minus Breeding, MD

## 2022-10-25 DIAGNOSIS — E785 Hyperlipidemia, unspecified: Secondary | ICD-10-CM

## 2022-10-25 DIAGNOSIS — R011 Cardiac murmur, unspecified: Secondary | ICD-10-CM | POA: Diagnosis not present

## 2022-10-25 DIAGNOSIS — R42 Dizziness and giddiness: Secondary | ICD-10-CM

## 2022-10-29 ENCOUNTER — Other Ambulatory Visit: Payer: Self-pay | Admitting: *Deleted

## 2022-10-29 DIAGNOSIS — I6521 Occlusion and stenosis of right carotid artery: Secondary | ICD-10-CM

## 2022-11-07 ENCOUNTER — Ambulatory Visit (HOSPITAL_COMMUNITY)
Admission: RE | Admit: 2022-11-07 | Discharge: 2022-11-07 | Disposition: A | Discharge status: Home / Self Care | Payer: Medicare Other | Source: Ambulatory Visit | Attending: Vascular Surgery | Admitting: Vascular Surgery

## 2022-11-07 DIAGNOSIS — I6521 Occlusion and stenosis of right carotid artery: Secondary | ICD-10-CM | POA: Diagnosis not present

## 2022-11-11 NOTE — Progress Notes (Unsigned)
[facility] [place]  ASSESSMENT / PLAN: Nathan [name] is a 77 y.o. male with asymptomatic bilateral carotid artery stenosis (right 40 to 59%; Left 1 to 39%).   The patient should continue best medical therapy for carotid artery stenosis including: Complete cessation from all tobacco products. Blood glucose control with goal A1c < 7%. Blood pressure control with goal blood pressure < 140/90 mmHg. Lipid reduction therapy with goal LDL-C <100 mg/dL  Aspirin 81mg  PO QD.  Atorvastatin 40-80mg  PO QD (or other "high intensity" statin therapy).  I reassured him about these findings today.  I counseled him that his transient dizziness is not being caused by carotid artery stenosis.  His vertebral artery circulation appears normal, making vertebrobasilar insufficiency unlikely.  I encouraged him to try statin therapy.  He is leery about this. I will see him again in 1 year with repeat carotid artery duplex for surveillance.   CHIEF COMPLAINT: Carotid artery stenosis on duplex  HISTORY OF PRESENT ILLNESS: Nathan [name] is a 77 y.o. male referred to clinic for evaluation of carotid artery stenosis identified on recent carotid duplex.  The patient had 2 episodes of dizziness associated with diaphoresis.  The patient reports possible vertiginous component with altered sensation of movement and a visual disturbance where his eyes felt unfocused or moving.  I specifically asked the patient about typical focal neurologic symptoms of anterior circulation stroke.  He denies amaurosis, facial droop, unilateral weakness, difficulty speaking, etc.  We reviewed the natural history of carotid artery stenosis.  I explained the rationale for best medical therapy in detail with him.  He is a bit worried about statin therapy.  I reviewed the current status signs to the best of my ability with the patient.  Past Medical History:  Diagnosis Date   Adrenal mass (HCC) 2018   Urology fully evaluated;  benign   Allergy    hay fever   Anemia    resolved spontaneously   Anxiety    Cataract    Cervicalgia    Complication of anesthesia    hypoglycemia, low blood pressure, pt. reports it dropped to zero- post op MORPHINE , then taken ICU due to either the morphine or hypoglycemia . In addition post op from cataract surgery- 07/2015, experienced an episode of hypoglycemia     Depressive disorder, not elsewhere classified    Diverticulosis    Elevated prostate specific antigen (PSA) 2008   Dr Annabell Howells   Fecal occult blood test positive 02/07/2020   Gallstone    GERD (gastroesophageal reflux disease)    H/O cardiovascular stress test >10 yrs.   told that its wnl. Told to drink less coffee   Hematuria 2018   Hematuria with enlarged prostate, kidney cyst, adrenal adenoma--> followed by urology with cystoscopy and repeat CT, benign workup.   Hx of migraines    Hypertension    Hyperthyroidism    Treated with medication no longer needed at this time   Hypoglycemia    IBS (irritable bowel syndrome)    patient not aware   Inguinal hernia 12/2015   Bilateral small indirect hernias on CT, umbilical hernia also present.   Kidney cyst, acquired 2018   Followed by urology, benign.   Low back pain    Macular degeneration (senile) of retina, unspecified    2 different opinions from 2 different Ophth   Osteoarthrosis, unspecified whether generalized or localized, unspecified site    Other and unspecified hyperlipidemia    Prostatitis 2008  Rectal cancer (HCC)    Refusal of blood transfusions as patient is Jehovah's Witness    Segmental and somatic dysfunction of cervical region    Segmental and somatic dysfunction of lumbar region    Segmental and somatic dysfunction of sacral region    Segmental and somatic dysfunction of thoracic region    Spondylosis of lumbar spine    Strain of pelvis    Tuberculosis    had exposure as child tests positive on skin test.    Past Surgical History:   Procedure Laterality Date   BIOPSY  05/24/2020   Procedure: BIOPSY;  Surgeon: Lemar Lofty., MD;  Location: [facility] ENDOSCOPY;  Service: Gastroenterology;;   CATARACT EXTRACTION, BILATERAL     COLONOSCOPY  2012   Dr Juanda Chance   COLONOSCOPY WITH PROPOFOL N/A 05/24/2020   Procedure: COLONOSCOPY WITH PROPOFOL;  Surgeon: Lemar Lofty., MD;  Location: [facility] ENDOSCOPY;  Service: Gastroenterology;  Laterality: N/A;   CYSTOSCOPY     for hematuria   EUS N/A 05/24/2020   Procedure: LOWER ENDOSCOPIC ULTRASOUND (EUS);  Surgeon: Lemar Lofty., MD;  Location: [facility] ENDOSCOPY;  Service: Gastroenterology;  Laterality: N/A;   hydrocoelectomy  2003   Dr Annabell Howells   KNEE ARTHROSCOPY Left 1994   OSTOMY N/A 06/21/2020   Procedure: POSSIBLE OSTOMY;  Surgeon: Karie Soda, MD;  Location: [facility] ORS;  Service: General;  Laterality: N/A;   POLYPECTOMY  05/24/2020   Procedure: POLYPECTOMY;  Surgeon: Lemar Lofty., MD;  Location: [facility] ENDOSCOPY;  Service: Gastroenterology;;   PROCTOSCOPY N/A 06/21/2020   Procedure: RIGID PROCTOSCOPY;  Surgeon: Karie Soda, MD;  Location: [facility] ORS;  Service: General;  Laterality: N/A;   prostate nodule resection  2003   TONSILLECTOMY     TOTAL KNEE ARTHROPLASTY  2009   TOTAL KNEE ARTHROPLASTY Right 06/27/2016   Procedure: TOTAL KNEE ARTHROPLASTY;  Surgeon: Tarry Kos, MD;  Location: [facility] OR;  Service: Orthopedics;  Laterality: Right;   XI ROBOTIC ASSISTED LOWER ANTERIOR RESECTION N/A 06/21/2020   Procedure: XI ROBOTIC ASSISTED LOWER ANTERIOR RECTOSIGMOID RESECTION, BILATERAL TAP BLOCK;  Surgeon: Karie Soda, MD;  Location: [facility] ORS;  Service: General;  Laterality: N/A;    Family History  Problem Relation Age of Onset   Dementia Mother    Lung cancer Father        smoker   Alcohol abuse Father    Tuberculosis Father    Early death Father    Heart attack Brother 19   Diabetes Other    Stroke Neg Hx    Thyroid disease Neg Hx    Colon cancer Neg Hx     Stomach cancer Neg Hx    Rectal cancer Neg Hx    Esophageal cancer Neg Hx    Inflammatory bowel disease Neg Hx    Liver disease Neg Hx    Pancreatic cancer Neg Hx    Colon polyps Neg Hx     Social History   Socioeconomic History   Marital status: Married    Spouse name: Not on file   Number of children: 4   Years of education: Not on file   Highest education level: Not on file  Occupational History   Not on file  Tobacco Use   Smoking status: Former    Types: Cigarettes    Quit date: 07/15/1968    Years since quitting: 54.3   Smokeless tobacco: Never   Tobacco comments:    smoked 1963 - 1970, up to 1ppd  Vaping Use  Vaping Use: Never used  Substance and Sexual Activity   Alcohol use: Yes    Comment: on occasion   Drug use: No   Sexual activity: Yes    Partners: Female    Birth control/protection: None  Other Topics Concern   Not on file  Social History Narrative   Married. Wife's name is Clydie Braun. Part time employed, Psychologist, counselling.   Drinks caffeinated beverages. Take a multivitamin. Wears his seatbelt.   Exercises at least 3 times a week. Is not a strict vegetarian, but only eats meat 1-2 times a week   Wears a hearing aid. Does not wear dentures or use an assistive walking device.   There is a smoke detector in his home.   He feels safe in his relationships.   Social Determinants of Health   Financial Resource Strain: Low Risk  (10/24/2021)   Overall Financial Resource Strain (CARDIA)    Difficulty of Paying Living Expenses: Not hard at all  Food Insecurity: No Food Insecurity (03/22/2022)   Hunger Vital Sign    Worried About Running Out of Food in the Last Year: Never true    Ran Out of Food in the Last Year: Never true  Transportation Needs: No Transportation Needs (03/22/2022)   PRAPARE - Administrator, Civil Service (Medical): No    Lack of Transportation (Non-Medical): No  Physical Activity: Insufficiently Active (10/24/2021)    Exercise Vital Sign    Days of Exercise per Week: 3 days    Minutes of Exercise per Session: 40 min  Stress: No Stress Concern Present (10/24/2021)   [employer] of Occupational Health - Occupational Stress Questionnaire    Feeling of Stress : Not at all  Social Connections: Moderately Integrated (10/24/2021)   Social Connection and Isolation Panel [NHANES]    Frequency of Communication with Friends and Family: More than three times a week    Frequency of Social Gatherings with Friends and Family: Three times a week    Attends Religious Services: More than 4 times per year    Active Member of Clubs or Organizations: No    Attends Banker Meetings: Never    Marital Status: Married  Catering manager Violence: Not At Risk (10/24/2021)   Humiliation, Afraid, Rape, and Kick questionnaire    Fear of Current or Ex-Partner: No    Emotionally Abused: No    Physically Abused: No    Sexually Abused: No    Allergies  Allergen Reactions   Morphine And Related Other (See Comments)    Severe drop in blood pressure   Sertraline Hcl Other (See Comments)    Headache    Current Outpatient Medications  Medication Sig Dispense Refill   amLODipine (NORVASC) 10 MG tablet Take 1 tablet (10 mg total) by mouth daily. 90 tablet 1   b complex vitamins tablet Take 1 tablet by mouth every other day.     KRILL OIL PO Take by mouth.     lisinopril (ZESTRIL) 10 MG tablet Take 1 tablet (10 mg total) by mouth daily. 90 tablet 1   Menaquinone-7 (K2 PO) Take by mouth.     Multiple Vitamin tablet Take 1 tablet by mouth daily.      rosuvastatin (CRESTOR) 20 MG tablet Take 1 tablet (20 mg total) by mouth daily. 90 tablet 3   No current facility-administered medications for this visit.    PHYSICAL EXAM Vitals:   11/12/22 0953 11/12/22 0955  BP: (!) 153/81 (!) 156/80  Pulse: (!) 57   Resp: 20   Temp: 98.3 F (36.8 C)   SpO2: 98%   Weight: 186 lb 9.6 oz (84.6 kg)   Height: 5' 10.5" (1.791  m)     Well-appearing elderly man in no acute distress Regular rate and rhythm Unlabored breathing Normal neurologic exam.  Normal gait and station.  PERTINENT LABORATORY AND RADIOLOGIC DATA  Most recent CBC    Latest Ref Rng & Units 10/09/2022   10:32 AM 05/22/2022   10:47 AM 12/18/2021   11:13 AM  CBC  WBC 4.0 - 10.5 K/uL 7.2  8.1  8.4   Hemoglobin 13.0 - 17.0 g/dL 16.1  09.6  04.5   Hematocrit 39.0 - 52.0 % 41.4  39.1  43.9   Platelets 150.0 - 400.0 K/uL 276.0  257.0  256.0      Most recent CMP    Latest Ref Rng & Units 10/09/2022   10:32 AM 05/22/2022   10:47 AM 12/18/2021   11:13 AM  CMP  Glucose 70 - 99 mg/dL 409  76  86   BUN 6 - 23 mg/dL 17  19  17    Creatinine 0.40 - 1.50 mg/dL 8.11  9.14  7.82   Sodium 135 - 145 mEq/L 139  137  140   Potassium 3.5 - 5.1 mEq/L 4.1  4.5  3.7   Chloride 96 - 112 mEq/L 103  103  104   CO2 19 - 32 mEq/L 29  28  25    Calcium 8.4 - 10.5 mg/dL 9.2  9.3  9.4   Total Protein 6.0 - 8.3 g/dL 6.5  6.8  6.5   Total Bilirubin 0.2 - 1.2 mg/dL 0.7  0.6  0.8   Alkaline Phos 39 - 117 U/L 57  60  68   AST 0 - 37 U/L 17  15  16    ALT 0 - 53 U/L 12  11  12      Renal function CrCl cannot be calculated (Patient's most recent lab result is older than the maximum 21 days allowed.).  Hgb A1c MFr Bld (%)  Date Value  05/22/2022 5.6    LDL Cholesterol (Calc)  Date Value Ref Range Status  04/20/2018 199 (H) mg/dL (calc) Final    Comment:    LDL-C levels > or = 190 mg/dL may indicate familial  hypercholesterolemia (FH). Clinical assessment and  measurement of blood lipid levels should be  considered for all first degree relatives of  patients with an FH diagnosis.  For questions about testing for familial hypercholesterolemia, please call Engineer, materials at [email or web address]. Wardell Honour, et al. J National Lipid Association  Recommendations for Patient-Centered Management of  Dyslipidemia: Part 1 Journal of Clinical Lipidology   2015;9(2), 129-169. Reference range: <100 . Desirable range <100 mg/dL for primary prevention;   <70 mg/dL for patients with CHD or diabetic patients  with > or = 2 CHD risk factors. [name] LDL-C is now calculated using the Martin-Hopkins  calculation, which is a validated novel method providing  better accuracy than the Friedewald equation in the  estimation of LDL-C.  Horald Pollen et al. Lenox Ahr. 9562;130(86): 2061-2068  (http://education.[facility].com/faq/FAQ164)    LDL Cholesterol  Date Value Ref Range Status  05/22/2022 191 (H) 0 - 99 mg/dL Final   Direct LDL  Date Value Ref Range Status  12/18/2021 192.0 mg/dL Final    Comment:    Optimal:  <100 mg/dLNear or Above Optimal:  100-129 mg/dLBorderline High:  130-159  mg/dLHigh:  160-189 mg/dLVery High:  >190 mg/dL     Carotid duplex;  Right Carotid: Velocities in the right ICA are consistent with a 40-59%                 stenosis.   Left Carotid: Velocities in the left ICA are consistent with a 1-39%  stenosis.   Vertebrals: Bilateral vertebral arteries demonstrate antegrade flow.   *See table(s) above for measurements and observations.      Rande Brunt. Lenell Antu, MD FACS [facility] Phone Number: [phone] 11/11/2022 10:05 AM   Total time spent on preparing this encounter including chart review, data review, collecting history, examining the patient, coordinating care for this new patient, 60 minutes.  Portions of this report may have been transcribed using voice recognition software.  Every effort has been made to ensure accuracy; however, inadvertent computerized transcription errors may still be present.

## 2022-11-12 ENCOUNTER — Ambulatory Visit (INDEPENDENT_AMBULATORY_CARE_PROVIDER_SITE_OTHER): Payer: Medicare Other | Admitting: Vascular Surgery

## 2022-11-12 ENCOUNTER — Encounter: Payer: Self-pay | Admitting: Vascular Surgery

## 2022-11-12 VITALS — BP 156/80 | HR 57 | Temp 98.3°F | Resp 20 | Ht 70.5 in | Wt 186.6 lb

## 2022-11-12 DIAGNOSIS — I6523 Occlusion and stenosis of bilateral carotid arteries: Secondary | ICD-10-CM | POA: Diagnosis not present

## 2022-11-13 ENCOUNTER — Telehealth (HOSPITAL_COMMUNITY): Payer: Self-pay | Admitting: Cardiology

## 2022-11-13 NOTE — Telephone Encounter (Signed)
11/13/22 Pt cancelled  Echocardiogram scheduled for 11/15/22 and does not wish to reschedule/LBW 9am  Order will be removed from the echo WQ. Thank you

## 2022-11-15 ENCOUNTER — Encounter (HOSPITAL_COMMUNITY): Payer: Self-pay

## 2022-11-15 ENCOUNTER — Ambulatory Visit (HOSPITAL_COMMUNITY): Payer: Medicare Other

## 2022-11-19 ENCOUNTER — Telehealth: Payer: Self-pay | Admitting: Family Medicine

## 2022-11-19 NOTE — Telephone Encounter (Signed)
Copied from CRM [id number]. Topic: Medicare AWV >> Nov 19, 2022 12:20 PM Gwenith Spitz wrote: Reason for CRM: Called patient to schedule Medicare Annual Wellness Visit (AWV). Left message for patient to call back and schedule Medicare Annual Wellness Visit (AWV).  Last date of AWV: 10/24/2021  Please schedule an appointment at any time with Please schedule an appointment  any Wednesday as Tele/Video visits with Inetta Fermo, NHA. Please schedule AWVS with Inetta Fermo [facility].  Please schedule as video/tele visit only, Wednesdays only. .  If any questions, please contact me at [phone].  Thank you ,  Gabriel Cirri [facility] AWV TEAM Direct Dial [phone]

## 2022-11-22 ENCOUNTER — Other Ambulatory Visit: Payer: Self-pay

## 2022-11-22 DIAGNOSIS — I6523 Occlusion and stenosis of bilateral carotid arteries: Secondary | ICD-10-CM

## 2022-11-27 ENCOUNTER — Ambulatory Visit: Payer: Medicare Other | Attending: Cardiology

## 2022-11-27 DIAGNOSIS — E785 Hyperlipidemia, unspecified: Secondary | ICD-10-CM

## 2022-11-27 DIAGNOSIS — R011 Cardiac murmur, unspecified: Secondary | ICD-10-CM

## 2022-11-27 DIAGNOSIS — R42 Dizziness and giddiness: Secondary | ICD-10-CM

## 2022-12-12 ENCOUNTER — Ambulatory Visit: Payer: Medicare Other | Admitting: Cardiology

## 2022-12-24 ENCOUNTER — Ambulatory Visit (INDEPENDENT_AMBULATORY_CARE_PROVIDER_SITE_OTHER): Payer: Medicare Other | Admitting: Family Medicine

## 2022-12-24 ENCOUNTER — Encounter: Payer: Self-pay | Admitting: Family Medicine

## 2022-12-24 VITALS — BP 142/80 | HR 59 | Temp 98.7°F | Ht 70.0 in | Wt 185.1 lb

## 2022-12-24 DIAGNOSIS — N390 Urinary tract infection, site not specified: Secondary | ICD-10-CM

## 2022-12-24 DIAGNOSIS — R319 Hematuria, unspecified: Secondary | ICD-10-CM | POA: Diagnosis not present

## 2022-12-24 LAB — POCT URINALYSIS DIPSTICK
Bilirubin, UA: NEGATIVE
Glucose, UA: NEGATIVE
Ketones, UA: NEGATIVE
Nitrite, UA: NEGATIVE
Protein, UA: POSITIVE — AB
Spec Grav, UA: 1.015 (ref 1.010–1.025)
Urobilinogen, UA: 0.2 E.U./dL
pH, UA: 6 (ref 5.0–8.0)

## 2022-12-24 MED ORDER — CIPROFLOXACIN HCL 500 MG PO TABS
500.0000 mg | ORAL_TABLET | Freq: Two times a day (BID) | ORAL | 0 refills | Status: DC
Start: 2022-12-24 — End: 2023-01-08

## 2022-12-24 NOTE — Progress Notes (Signed)
Acute Office Visit   Subjective:  Patient ID: Nathan [name], male    DOB: 23-Feb-1946, 77 y.o.   MRN: [id number]  Chief Complaint  Patient presents with   Hematuria    Pt reports he has noticed blood in urine yesterday evening. Pt reports he has blood clot come out every time he is urinating. Pt reports this happen years ago. Pt denies pain with urination, reports he noticed lower back pain on both sides.   HPI Patient is 77 year old caucasian male that presents with hematuria. He noticed blood in urine yesterday evening. He reports initially one blood clot came out with blood in urine to follow and then had to get up several times through the night.  He reports he is having blood clot come out most time with urinating. He reports the blood is not as dark as previously.   Denies pain with urination, but reports lower back pain. Started yesterday morning and gradually become worse yesterday. Described as dull aching pain, intermittent. He reports he has not noticed any back pain this morning.   Denies fever, nausea, or vomiting.  History of hematuria, prostatitis, and kidney cyst back in 2018 .Previously seen urology, possibly [facility]. Does not recall a follow up about kidney cyst.   ROS See HPI above     Objective:   BP (!) 142/80   Pulse (!) 59   Temp 98.7 F (37.1 C)   Ht 5\' 10"  (1.778 m)   Wt 185 lb 2 oz (84 kg)   SpO2 98%   BMI 26.56 kg/m    Physical Exam Vitals reviewed.  Constitutional:      General: He is not in acute distress.    Appearance: Normal appearance. He is not ill-appearing, toxic-appearing or diaphoretic.  Eyes:     General:        Right eye: No discharge.        Left eye: No discharge.     Conjunctiva/sclera: Conjunctivae normal.  Cardiovascular:     Rate and Rhythm: Normal rate and regular rhythm.     Heart sounds: Normal heart sounds. No murmur heard.    No friction rub. No gallop.  Pulmonary:     Effort: Pulmonary effort is normal.  No respiratory distress.     Breath sounds: Normal breath sounds.  Abdominal:     General: Bowel sounds are normal.     Palpations: Abdomen is soft.     Tenderness: There is no abdominal tenderness. There is no right CVA tenderness or left CVA tenderness.  Musculoskeletal:        General: No tenderness (Lumbar). Normal range of motion.  Skin:    General: Skin is warm and dry.  Neurological:     General: No focal deficit present.     Mental Status: He is alert and oriented to person, place, and time. Mental status is at baseline.  Psychiatric:        Mood and Affect: Mood normal.        Behavior: Behavior normal.        Thought Content: Thought content normal.        Judgment: Judgment normal.     Results for orders placed or performed in visit on 12/24/22  POCT urinalysis dipstick  Result Value Ref Range   Color, UA red    Clarity, UA     Glucose, UA Negative Negative   Bilirubin, UA neg    Ketones, UA neg  Spec Grav, UA 1.015 1.010 - 1.025   Blood, UA 3+    pH, UA 6.0 5.0 - 8.0   Protein, UA Positive (A) Negative   Urobilinogen, UA 0.2 0.2 or 1.0 E.U./dL   Nitrite, UA neg    Leukocytes, UA Trace (A) Negative   Appearance     Odor    +1 leukocytes in urine     Assessment & Plan:  Hematuria, unspecified type -     POCT urinalysis dipstick -     Ambulatory referral to Urology  Urinary tract infection with hematuria, site unspecified -     Ciprofloxacin HCl; Take 1 tablet (500 mg total) by mouth 2 (two) times daily for 14 days.  Dispense: 28 tablet; Refill: 0 -     Urine Culture  -Urinalysis showed blood, protein, and leukocytes. Will send urine for culture. Office will call with results and he may see results on MyChart. Will start treatment for infection with Cipro 500mg  tablet, take 1 tablet every 12 hours for the next 14 days.  -Placed a referral to urology for blood in urine and concerned about previous past urology history. Advised If he do not hear about an  appointment in 2 weeks, please call back to the office.  -Follow up with his PCP in 2 weeks for a recheck of urine.  -Advised if he started to have multiple blood clots, please go directly to the emergency department.  -Patient expressed understanding of plan of care and agrees with treatment.   Zandra Abts, NP

## 2022-12-24 NOTE — Patient Instructions (Addendum)
-  Urinalysis showed blood, protein, and leukocytes. Will send urine for culture. Office will call with results and you may see results on MyChart. Will start treatment for infection with Cipro 500mg  tablet, take 1 tablet every 12 hours for the next 14 days.  -Placed a referral to urology for blood in urine. If you do not hear about an appointment in 2 weeks, please call back to the office.  -Follow up with your PCP in 2 weeks for a recheck of urine.  -If you start to have multiple blood clots, please go directly to the emergency department.

## 2022-12-26 ENCOUNTER — Telehealth: Payer: Self-pay

## 2022-12-26 LAB — URINE CULTURE
MICRO NUMBER:: 15070808
Result:: NO GROWTH
SPECIMEN QUALITY:: ADEQUATE

## 2022-12-26 LAB — EXTRA URINE SPECIMEN

## 2022-12-26 NOTE — Telephone Encounter (Signed)
-----   Message from Alveria Apley, NP sent at 12/26/2022  8:09 AM EDT ----- Your urine culture came back with no growth on urine culture. How are you feeling? Were you able to get an appointment with urology?

## 2022-12-26 NOTE — Telephone Encounter (Signed)
Pt reports blood has stopped in urine. Pt has f/u appt with his PCP Pt has not heard from Urlogy referral

## 2022-12-27 NOTE — Telephone Encounter (Signed)
Pt is aware.  

## 2023-01-08 ENCOUNTER — Encounter: Payer: Self-pay | Admitting: Family Medicine

## 2023-01-08 ENCOUNTER — Ambulatory Visit (INDEPENDENT_AMBULATORY_CARE_PROVIDER_SITE_OTHER): Payer: Medicare Other | Admitting: Family Medicine

## 2023-01-08 VITALS — BP 153/71 | HR 60 | Temp 98.4°F | Wt 184.2 lb

## 2023-01-08 DIAGNOSIS — R310 Gross hematuria: Secondary | ICD-10-CM | POA: Diagnosis not present

## 2023-01-08 LAB — POC URINALSYSI DIPSTICK (AUTOMATED)
Bilirubin, UA: NEGATIVE
Blood, UA: NEGATIVE
Glucose, UA: NEGATIVE
Ketones, UA: NEGATIVE
Leukocytes, UA: NEGATIVE
Nitrite, UA: NEGATIVE
Protein, UA: NEGATIVE
Spec Grav, UA: 1.015 (ref 1.010–1.025)
Urobilinogen, UA: 0.2 E.U./dL
pH, UA: 6 (ref 5.0–8.0)

## 2023-01-08 NOTE — Progress Notes (Signed)
Nathan [name] , 03-10-1946, 77 y.o., male MRN: [id number] Patient Care Team    Relationship Specialty Notifications Start End  Natalia Leatherwood, DO PCP - General Family Medicine  12/07/15   Pa, [facility]    06/02/17   Tarry Kos, MD Attending Physician Orthopedic Surgery  06/02/17   Ladene Artist, MD Consulting Physician Oncology  05/29/20   Karie Soda, MD Consulting Physician General Surgery  06/19/20   Baldo Daub, MD Consulting Physician Cardiology  06/19/20   Armbruster, Willaim Rayas, MD Consulting Physician Gastroenterology  06/26/20   Alejandro Mulling, RN [facility] Care Management   03/22/22     Chief Complaint  Patient presents with   Hematuria    Stopped right after seeing Clinton Sawyer N.P; last abx was yesterday for bladder infection; no meds taken this AM     Subjective: Nathan [name] is a 77 y.o. Pt presents for an OV to follow-up on hematuria he had experienced.  He was seen 12/24/2022 for painless gross hematuria.  Urine culture was negative.  RBC 3+ urine.  Patient denied any fever, chills, dysuria or urinary frequency.  He was treated with Cipro reports the symptoms were gone within 1 to 2 days after starting medication.  He is established with a urologist Dr. Alvester Morin.  He did not reach out to him this particular location.  He states the hematuria has happened in the past and self resolved.     12/24/2022    9:44 AM 05/22/2022   10:31 AM 03/22/2022    2:48 PM 10/24/2021    8:20 AM 04/12/2021    8:30 AM  Depression screen PHQ 2/9  Decreased Interest 1 1 0 0 0  Down, Depressed, Hopeless 0  0 1 0  PHQ - 2 Score 1 1 0 1 0  Altered sleeping 0 3     Tired, decreased energy 1 3     Change in appetite 2 0     Feeling bad or failure about yourself  0 0     Trouble concentrating 0 0     Moving slowly or fidgety/restless 0 0     Suicidal thoughts 0 0     PHQ-9 Score 4 7     Difficult doing work/chores Somewhat difficult         Allergies  Allergen Reactions   Morphine And Codeine Other (See Comments)    Severe drop in blood pressure   Sertraline Hcl Other (See Comments)    Headache   Social History   Social History Narrative   Married. Wife's name is Clydie Braun. Part time employed, Psychologist, counselling.   Drinks caffeinated beverages. Take a multivitamin. Wears his seatbelt.   Exercises at least 3 times a week. Is not a strict vegetarian, but only eats meat 1-2 times a week   Wears a hearing aid. Does not wear dentures or use an assistive walking device.   There is a smoke detector in his home.   He feels safe in his relationships.   Past Medical History:  Diagnosis Date   Adrenal mass (HCC) 2018   Urology fully evaluated; benign   Allergy    hay fever   Anemia    resolved spontaneously   Anxiety    Cataract    Cervicalgia    Complication of anesthesia    hypoglycemia, low blood pressure, pt. reports it dropped to zero- post op MORPHINE , then taken  ICU due to either the morphine or hypoglycemia . In addition post op from cataract surgery- 07/2015, experienced an episode of hypoglycemia     Depressive disorder, not elsewhere classified    Diverticulosis    Elevated prostate specific antigen (PSA) 2008   Dr Annabell Howells   Fecal occult blood test positive 02/07/2020   Gallstone    GERD (gastroesophageal reflux disease)    H/O cardiovascular stress test >10 yrs.   told that its wnl. Told to drink less coffee   Hematuria 2018   Hematuria with enlarged prostate, kidney cyst, adrenal adenoma--> followed by urology with cystoscopy and repeat CT, benign workup.   Hx of migraines    Hypertension    Hyperthyroidism    Treated with medication no longer needed at this time   Hypoglycemia    IBS (irritable bowel syndrome)    patient not aware   Inguinal hernia 12/2015   Bilateral small indirect hernias on CT, umbilical hernia also present.   Kidney cyst, acquired 2018   Followed by urology, benign.    Low back pain    Macular degeneration (senile) of retina, unspecified    2 different opinions from 2 different Ophth   Osteoarthrosis, unspecified whether generalized or localized, unspecified site    Other and unspecified hyperlipidemia    Prostatitis 2008   Rectal cancer (HCC)    Refusal of blood transfusions as patient is Jehovah's Witness    Segmental and somatic dysfunction of cervical region    Segmental and somatic dysfunction of lumbar region    Segmental and somatic dysfunction of sacral region    Segmental and somatic dysfunction of thoracic region    Spondylosis of lumbar spine    Strain of pelvis    Tuberculosis    had exposure as child tests positive on skin test.   Past Surgical History:  Procedure Laterality Date   BIOPSY  05/24/2020   Procedure: BIOPSY;  Surgeon: Lemar Lofty., MD;  Location: [facility] ENDOSCOPY;  Service: Gastroenterology;;   CATARACT EXTRACTION, BILATERAL     COLONOSCOPY  2012   Dr Juanda Chance   COLONOSCOPY WITH PROPOFOL N/A 05/24/2020   Procedure: COLONOSCOPY WITH PROPOFOL;  Surgeon: Lemar Lofty., MD;  Location: [facility] ENDOSCOPY;  Service: Gastroenterology;  Laterality: N/A;   CYSTOSCOPY     for hematuria   EUS N/A 05/24/2020   Procedure: LOWER ENDOSCOPIC ULTRASOUND (EUS);  Surgeon: Lemar Lofty., MD;  Location: [facility] ENDOSCOPY;  Service: Gastroenterology;  Laterality: N/A;   hydrocoelectomy  2003   Dr Annabell Howells   KNEE ARTHROSCOPY Left 1994   OSTOMY N/A 06/21/2020   Procedure: POSSIBLE OSTOMY;  Surgeon: Karie Soda, MD;  Location: [facility] ORS;  Service: General;  Laterality: N/A;   POLYPECTOMY  05/24/2020   Procedure: POLYPECTOMY;  Surgeon: Lemar Lofty., MD;  Location: [facility] ENDOSCOPY;  Service: Gastroenterology;;   PROCTOSCOPY N/A 06/21/2020   Procedure: RIGID PROCTOSCOPY;  Surgeon: Karie Soda, MD;  Location: [facility] ORS;  Service: General;  Laterality: N/A;   prostate nodule resection  2003   TONSILLECTOMY     TOTAL KNEE  ARTHROPLASTY  2009   TOTAL KNEE ARTHROPLASTY Right 06/27/2016   Procedure: TOTAL KNEE ARTHROPLASTY;  Surgeon: Tarry Kos, MD;  Location: [facility] OR;  Service: Orthopedics;  Laterality: Right;   XI ROBOTIC ASSISTED LOWER ANTERIOR RESECTION N/A 06/21/2020   Procedure: XI ROBOTIC ASSISTED LOWER ANTERIOR RECTOSIGMOID RESECTION, BILATERAL TAP BLOCK;  Surgeon: Karie Soda, MD;  Location: [facility] ORS;  Service: General;  Laterality: N/A;  Family History  Problem Relation Age of Onset   Dementia Mother    Lung cancer Father        smoker   Alcohol abuse Father    Tuberculosis Father    Early death Father    Heart attack Brother 73   Diabetes Other    Stroke Neg Hx    Thyroid disease Neg Hx    Colon cancer Neg Hx    Stomach cancer Neg Hx    Rectal cancer Neg Hx    Esophageal cancer Neg Hx    Inflammatory bowel disease Neg Hx    Liver disease Neg Hx    Pancreatic cancer Neg Hx    Colon polyps Neg Hx    Allergies as of 01/08/2023       Reactions   Morphine And Codeine Other (See Comments)   Severe drop in blood pressure   Sertraline Hcl Other (See Comments)   Headache        Medication List        Accurate as of January 08, 2023  2:21 PM. If you have any questions, ask your nurse or doctor.          STOP taking these medications    ciprofloxacin 500 MG tablet Commonly known as: Cipro Stopped by: Felix Pacini, DO       TAKE these medications    amLODipine 10 MG tablet Commonly known as: NORVASC Take 1 tablet (10 mg total) by mouth daily.   ascorbic acid 500 MG tablet Commonly known as: VITAMIN C Take 1,000 mg by mouth 2 (two) times daily.   b complex vitamins tablet Take 1 tablet by mouth every other day.   B-12 SL Place under the tongue.   D3 PO Take by mouth.   K2 PO Take by mouth.   KRILL OIL PO Take by mouth.   lisinopril 10 MG tablet Commonly known as: ZESTRIL Take 1 tablet (10 mg total) by mouth daily.   QC TUMERIC COMPLEX PO Take by mouth.         All past medical history, surgical history, allergies, family history, immunizations andmedications were updated in the EMR today and reviewed under the history and medication portions of their EMR.     ROS Negative, with the exception of above mentioned in HPI   Objective:  BP (!) 153/71   Pulse 60   Temp 98.4 F (36.9 C)   Wt 184 lb 3.2 oz (83.6 kg)   SpO2 96%   BMI 26.43 kg/m  Body mass index is 26.43 kg/m. Physical Exam Vitals and nursing note reviewed.  Constitutional:      General: He is not in acute distress.    Appearance: Normal appearance. He is not ill-appearing, toxic-appearing or diaphoretic.  HENT:     Head: Normocephalic and atraumatic.  Eyes:     General: No scleral icterus.       Right eye: No discharge.        Left eye: No discharge.     Extraocular Movements: Extraocular movements intact.     Pupils: Pupils are equal, round, and reactive to light.  Abdominal:     General: There is no distension.     Palpations: Abdomen is soft.     Tenderness: There is no abdominal tenderness. There is no right CVA tenderness or left CVA tenderness.  Skin:    General: Skin is warm and dry.     Coloration: Skin is not jaundiced or pale.  Findings: No rash.  Neurological:     Mental Status: He is alert and oriented to person, place, and time. Mental status is at baseline.  Psychiatric:        Mood and Affect: Mood normal.        Behavior: Behavior normal.        Thought Content: Thought content normal.        Judgment: Judgment normal.     No results found. No results found. Results for orders placed or performed in visit on 01/08/23 (from the past 24 hour(s))  POCT Urinalysis Dipstick (Automated)     Status: None   Collection Time: 01/08/23 10:01 AM  Result Value Ref Range   Color, UA yellow    Clarity, UA clear    Glucose, UA Negative Negative   Bilirubin, UA neg    Ketones, UA neg    Spec Grav, UA 1.015 1.010 - 1.025   Blood, UA neg    pH,  UA 6.0 5.0 - 8.0   Protein, UA Negative Negative   Urobilinogen, UA 0.2 0.2 or 1.0 E.U./dL   Nitrite, UA neg    Leukocytes, UA Negative Negative    Assessment/Plan: Nathan [name] is a 77 y.o. male present for OV for  Gross hematuria Point of Care result is negative for RBC.  Will send for micro analysis with reflex culture.  Patient was encouraged to continue to monitor if hematuria recurs would have him follow-up with his urologist. - POCT Urinalysis Dipstick (Automated) - Urinalysis w microscopic + reflex cultur  Reviewed expectations re: course of current medical issues. Discussed self-management of symptoms. Outlined signs and symptoms indicating need for more acute intervention. Patient verbalized understanding and all questions were answered. Patient received an After-Visit Summary.    Orders Placed This Encounter  Procedures   Urinalysis w microscopic + reflex cultur   POCT Urinalysis Dipstick (Automated)   No orders of the defined types were placed in this encounter.  Referral Orders  No referral(s) requested today     Note is dictated utilizing voice recognition software. Although note has been proof read prior to signing, occasional typographical errors still can be missed. If any questions arise, please do not hesitate to call for verification.   electronically signed by:  Felix Pacini, DO  [facility] - [place]

## 2023-01-09 ENCOUNTER — Telehealth: Payer: Self-pay | Admitting: Family Medicine

## 2023-01-09 LAB — URINALYSIS W MICROSCOPIC + REFLEX CULTURE
Bacteria, UA: NONE SEEN /HPF
Bilirubin Urine: NEGATIVE
Glucose, UA: NEGATIVE
Hgb urine dipstick: NEGATIVE
Hyaline Cast: NONE SEEN /LPF
Ketones, ur: NEGATIVE
Leukocyte Esterase: NEGATIVE
Nitrites, Initial: NEGATIVE
Protein, ur: NEGATIVE
RBC / HPF: NONE SEEN /HPF (ref 0–2)
Specific Gravity, Urine: 1.007 (ref 1.001–1.035)
Squamous Epithelial / HPF: NONE SEEN /HPF (ref <=5)
WBC, UA: NONE SEEN /HPF (ref 0–5)
pH: 6.5 (ref 5.0–8.0)

## 2023-01-09 LAB — NO CULTURE INDICATED

## 2023-01-09 NOTE — Telephone Encounter (Signed)
Please call patient and inform him his microanalysis of his urine is completely normal, no red blood cells appreciated.  Continue to monitor over time and if hematuria reoccurs would encourage him to be seen by urology.

## 2023-01-09 NOTE — Telephone Encounter (Signed)
Left pt VM to discuss

## 2023-01-10 NOTE — Telephone Encounter (Signed)
Returned call and given pt results and recommendations.

## 2023-03-28 ENCOUNTER — Telehealth: Payer: Self-pay

## 2023-03-28 MED ORDER — AMLODIPINE BESYLATE 10 MG PO TABS: 10.0000 mg | ORAL_TABLET | Freq: Every day | ORAL | 0 refills | Status: DC

## 2023-03-28 MED ORDER — LISINOPRIL 10 MG PO TABS: 10.0000 mg | ORAL_TABLET | Freq: Every day | ORAL | 0 refills | Status: DC

## 2023-03-28 NOTE — Telephone Encounter (Signed)
Patient leaving for [place] and will be gone for 1 month.  Prescription Request  03/28/2023  LOV: Visit date not found  What is the name of the medication or equipment? lisinopril (ZESTRIL) 10 MG tablet  amLODipine (NORVASC) 10 MG tablet   Have you contacted your pharmacy to request a refill? No   Which pharmacy would you like this sent to?  [id number] - [place], [place] - [address] [place] [address] Phone: [phone] Fax: [phone]    Patient notified that their request is being sent to the clinical staff for review and that they should receive a response within 2 business days.   Please advise at Mobile [phone] (mobile)

## 2023-07-25 ENCOUNTER — Other Ambulatory Visit: Payer: Self-pay

## 2023-07-25 NOTE — Addendum Note (Signed)
 Addended by: Filomena Jungling on: 07/25/2023 02:17 PM   Modules accepted: Orders

## 2023-08-15 ENCOUNTER — Other Ambulatory Visit: Payer: Self-pay | Admitting: Family Medicine

## 2023-08-15 NOTE — Telephone Encounter (Signed)
Copied from CRM [id number]. Topic: Clinical - Medication Refill >> Aug 15, 2023  4:20 PM Almira Coaster wrote: Most Recent Primary Care Visit:  Provider: Felix Pacini A  Department: [facility]  Visit Type: OFFICE VISIT  Date: 01/08/2023  Medication: amLODipine (NORVASC) 10 MG table & lisinopril (ZESTRIL) 10 MG tablet  Has the patient contacted their pharmacy? Yes, multiple request without a response. Asked patient to call provider's office (Agent: If no, request that the patient contact the pharmacy for the refill. If patient does not wish to contact the pharmacy document the reason why and proceed with request.) (Agent: If yes, when and what did the pharmacy advise?)  Is this the correct pharmacy for this prescription? Yes If no, delete pharmacy and type the correct one.  This is the patient's preferred pharmacy:  [id number] - [place], [place] - [address] [place] [address] Phone: [phone] Fax: [phone]   Has the prescription been filled recently? No  Is the patient out of the medication? No  Has the patient been seen for an appointment in the last year OR does the patient have an upcoming appointment? Yes  Can we respond through MyChart? No, phone call  Agent: Please be advised that Rx refills may take up to 3 business days. We ask that you follow-up with your pharmacy.

## 2023-08-15 NOTE — Telephone Encounter (Signed)
Last Fill: Amlodipine: 03/28/23     Lisinopril: 03/28/23  Last OV: 01/08/23 Next OV: None Scheduled  Routing to provider for review/authorization.

## 2023-09-15 ENCOUNTER — Ambulatory Visit (INDEPENDENT_AMBULATORY_CARE_PROVIDER_SITE_OTHER): Payer: Medicare Other | Admitting: Family Medicine

## 2023-09-15 ENCOUNTER — Encounter: Payer: Self-pay | Admitting: Family Medicine

## 2023-09-15 VITALS — BP 123/70 | HR 60 | Ht 68.75 in | Wt 190.2 lb

## 2023-09-15 DIAGNOSIS — Z0000 Encounter for general adult medical examination without abnormal findings: Secondary | ICD-10-CM | POA: Diagnosis not present

## 2023-09-15 DIAGNOSIS — E663 Overweight: Secondary | ICD-10-CM

## 2023-09-15 DIAGNOSIS — D126 Benign neoplasm of colon, unspecified: Secondary | ICD-10-CM

## 2023-09-15 DIAGNOSIS — Z125 Encounter for screening for malignant neoplasm of prostate: Secondary | ICD-10-CM | POA: Diagnosis not present

## 2023-09-15 DIAGNOSIS — I6521 Occlusion and stenosis of right carotid artery: Secondary | ICD-10-CM

## 2023-09-15 DIAGNOSIS — Z131 Encounter for screening for diabetes mellitus: Secondary | ICD-10-CM

## 2023-09-15 DIAGNOSIS — I10 Essential (primary) hypertension: Secondary | ICD-10-CM

## 2023-09-15 DIAGNOSIS — I700 Atherosclerosis of aorta: Secondary | ICD-10-CM | POA: Diagnosis not present

## 2023-09-15 DIAGNOSIS — E782 Mixed hyperlipidemia: Secondary | ICD-10-CM | POA: Diagnosis not present

## 2023-09-15 LAB — CBC
HCT: 44.1 % (ref 39.0–52.0)
Hemoglobin: 14.7 g/dL (ref 13.0–17.0)
MCHC: 33.3 g/dL (ref 30.0–36.0)
MCV: 92.3 fl (ref 78.0–100.0)
Platelets: 289 10*3/uL (ref 150.0–400.0)
RBC: 4.77 Mil/uL (ref 4.22–5.81)
RDW: 14.5 % (ref 11.5–15.5)
WBC: 7.3 10*3/uL (ref 4.0–10.5)

## 2023-09-15 LAB — TSH: TSH: 1.3 u[IU]/mL (ref 0.35–5.50)

## 2023-09-15 LAB — HEMOGLOBIN A1C: Hgb A1c MFr Bld: 5.6 % (ref 4.6–6.5)

## 2023-09-15 LAB — PSA, MEDICARE: PSA: 6.63 ng/mL — ABNORMAL HIGH (ref 0.10–4.00)

## 2023-09-15 MED ORDER — AMLODIPINE BESYLATE 10 MG PO TABS: 10.0000 mg | ORAL_TABLET | Freq: Every day | ORAL | 1 refills | Status: DC

## 2023-09-15 MED ORDER — LISINOPRIL 10 MG PO TABS: 10.0000 mg | ORAL_TABLET | Freq: Every day | ORAL | 1 refills | Status: DC

## 2023-09-15 MED ORDER — TETANUS-DIPHTH-ACELL PERTUSSIS 5-2.5-18.5 LF-MCG/0.5 IM SUSY: 0.5000 mL | PREFILLED_SYRINGE | Freq: Once | INTRAMUSCULAR | 0 refills | Status: AC

## 2023-09-15 NOTE — Progress Notes (Signed)
 Patient ID: Nathan [name], male  DOB: [birth year], 78 y.o.   MRN: [id number] Patient Care Team    Relationship Specialty Notifications Start End  Natalia Leatherwood, DO PCP - General Family Medicine  12/07/15   Pa, [facility]    06/02/17   Tarry Kos, MD Attending Physician Orthopedic Surgery  06/02/17   Ladene Artist, MD Consulting Physician Oncology  05/29/20   Karie Soda, MD Consulting Physician General Surgery  06/19/20   Baldo Daub, MD Consulting Physician Cardiology  06/19/20   Armbruster, Willaim Rayas, MD Consulting Physician Gastroenterology  06/26/20   Alejandro Mulling, RN [facility] Care Management   03/22/22   Leonie Douglas, MD Consulting Physician Vascular Surgery  09/15/23   Rollene Rotunda, MD Consulting Physician Cardiology  09/15/23     Chief Complaint  Patient presents with   Annual Exam    Pt is not fasting    Subjective: Nathan [name] is a 78 y.o. male present for CPE and Chronic Conditions/illness Management Health maintenance Colonoscopy: last screen 07/2021. Rectal cancer s/p robotic LAR 06/2020..3 yr Immunizations:  tdap 2014 UTD-printed, influenza declines, PNA series declines, zostavax declines, covid declines Infectious disease screening:Hep C declines. PSA: Followed by urology, last appt was over a year ago. Lab Results  Component Value Date   PSA 5.70 (H) 05/22/2022   PSA 7.58 (H) 04/12/2021   PSA 5.99 (H) 12/06/2019    Hypertension/atheroscolerosis of aorta/statin declined: Pt reports compliance amlodipine 10 mg daily and Lisinopril 10 mg qd. Patient denies chest pain, shortness of breath, dizziness or lower extremity edema.   he switched to a plant based diet last year, added back a small amount of meat weekly refused statin medication, or other cholesterol medications.  He has been referred to cardiology in the past. Exercise: Has started to exercise routinely. Using stationary bike 6 times a week for  approximately 20-30 minutes. RF: hypertension, hyperlipidemia, pulm former smoker, fhx heart disease, BMI>25    Patient has a CV risk of approximately 30% CV event in the next 10 years. RF: hypertension, hyperlipidemia, former smoker, fhx heart disease, BMI>25 Patient has a history of right carotid artery stenosis. 01/2022: Coronary Calcium score FINDINGS: Coronary arteries: Normal origins. Coronary Calcium Score: Left main: 2 Left anterior descending artery: 62 Left circumflex artery: 8 Right coronary artery: 16 Total: 88 Percentile: 29 Pericardium: Normal. Aorta: Normal caliber of ascending aorta. Aortic atherosclerosis noted. Non-cardiac: See separate report from [facility]. IMPRESSION: Coronary calcium score of 88. This was 29th percentile for age-, race-, and sex-matched controls.    Carotid duplex 01/2022:  IMPRESSION: Right: Heterogeneous and partially calcified plaque at the right carotid bifurcation contributes to 50%-69% stenosis by established duplex criteria. Left: Color duplex indicates moderate heterogeneous and calcified plaque, with no hemodynamically significant stenosis by duplex criteria in the extracranial cerebrovascular circulation.    01/26/2019 carotid vascular ultrasound: IMPRESSION: 1. Moderate (50-69%) stenosis proximal right internal carotid artery secondary to heterogeneous and mildly irregular atherosclerotic plaque. 2. Mild (1-49%) stenosis proximal left internal carotid artery secondary to heterogeneous and slightly irregular atherosclerotic plaque. 3. Flow reversal in the left vertebral artery consistent with subclavian steal in the setting of a hemodynamically significant stenosis or occlusion of the proximal subclavian artery. 4. Normal antegrade flow in the right vertebral artery  Echo 02/11/2019:  1. The left ventricle has normal systolic function, with an ejection  fraction of 60-65%. The cavity size was normal. There is  mildly increased  left ventricular wall thickness. Left ventricular diastolic parameters  were normal.   2. The right ventricle has normal systolc function. The cavity was mildly  enlarged. There is no increase in right ventricular wall thickness. Right  ventricular systolic pressure is normal with an estimated pressure of 28.0  mmHg.   3. Aortic valve regurgitation was not assessed by color flow Doppler.   4. Pulmonic valve regurgitation was not assessed by color flow Doppler.   02/16/2019-myocardial perfusion scan: The left ventricular ejection fraction is normal (55-65%). Nuclear stress EF: 55%. There was no ST segment deviation noted during stress. Defect 1: There is a small defect of mild severity present in the apex location. The study is normal. This is a low risk study.   Normal stress nuclear study with mild apical thinning but no ischemia.  Gated ejection fraction 55% with normal wall motion.    09/15/2023   10:01 AM 12/24/2022    9:44 AM 05/22/2022   10:31 AM 03/22/2022    2:48 PM 10/24/2021    8:20 AM  Depression screen PHQ 2/9  Decreased Interest 0 1 1 0 0  Down, Depressed, Hopeless 0 0  0 1  PHQ - 2 Score 0 1 1 0 1  Altered sleeping  0 3    Tired, decreased energy  1 3    Change in appetite  2 0    Feeling bad or failure about yourself   0 0    Trouble concentrating  0 0    Moving slowly or fidgety/restless  0 0    Suicidal thoughts  0 0    PHQ-9 Score  4 7    Difficult doing work/chores  Somewhat difficult         12/24/2022    9:44 AM 05/22/2022   10:31 AM 08/24/2015    2:57 PM  GAD 7 : Generalized Anxiety Score  Nervous, Anxious, on Edge 0 2 2  Control/stop worrying 0 2 3  Worry too much - different things 0 2 3  Trouble relaxing 0 1 1  Restless  0 0  Easily annoyed or irritable 0 2 2  Afraid - awful might happen 0 1 0  Total GAD 7 Score  10 11  Anxiety Difficulty Not difficult at all  Somewhat difficult        09/15/2023   10:01 AM 12/24/2022    9:43 AM  10/24/2021    8:23 AM 04/12/2021    8:30 AM 09/20/2020    9:11 AM  Fall Risk   Falls in the past year? 0 0 0 0 1  Number falls in past yr: 0 0 0 0 0  Injury with Fall? 0 0 0 0 0  Risk for fall due to : No Fall Risks No Fall Risks Impaired balance/gait  History of fall(s)  Risk for fall due to: Comment   not like it used to be    Follow up Falls evaluation completed Falls evaluation completed Falls prevention discussed Falls evaluation completed Falls prevention discussed   Immunization History  Administered Date(s) Administered   PFIZER(Purple Top)SARS-COV-2 Vaccination 09/20/2019, 10/20/2019, 07/03/2020   PPD Test 03/17/2013   Tdap 07/15/2012   Zoster, Live 02/06/2015   Past Medical History:  Diagnosis Date   Adrenal mass (HCC) 2018   Urology fully evaluated; benign   Allergy    hay fever   Anemia    resolved spontaneously   Anxiety    Cataract  Cervicalgia    Complication of anesthesia    hypoglycemia, low blood pressure, pt. reports it dropped to zero- post op MORPHINE , then taken ICU due to either the morphine or hypoglycemia . In addition post op from cataract surgery- 07/2015, experienced an episode of hypoglycemia     Depressive disorder, not elsewhere classified    Diverticulosis    Elevated prostate specific antigen (PSA) 2008   Dr Annabell Howells   Fecal occult blood test positive 02/07/2020   Gallstone    GERD (gastroesophageal reflux disease)    H/O cardiovascular stress test >10 yrs.   told that its wnl. Told to drink less coffee   Hematuria 2018   Hematuria with enlarged prostate, kidney cyst, adrenal adenoma--> followed by urology with cystoscopy and repeat CT, benign workup.   Hx of migraines    Hypertension    Hyperthyroidism    Treated with medication no longer needed at this time   Hypoglycemia    IBS (irritable bowel syndrome)    patient not aware   Inguinal hernia 12/2015   Bilateral small indirect hernias on CT, umbilical hernia also present.   Kidney  cyst, acquired 2018   Followed by urology, benign.   Low back pain    Macular degeneration (senile) of retina, unspecified    2 different opinions from 2 different Ophth   Osteoarthrosis, unspecified whether generalized or localized, unspecified site    Other and unspecified hyperlipidemia    Prostatitis 2008   Rectal cancer (HCC)    Refusal of blood transfusions as patient is Jehovah's Witness    Segmental and somatic dysfunction of cervical region    Segmental and somatic dysfunction of lumbar region    Segmental and somatic dysfunction of sacral region    Segmental and somatic dysfunction of thoracic region    Spondylosis of lumbar spine    Strain of pelvis    Tuberculosis    had exposure as child tests positive on skin test.   Allergies  Allergen Reactions   Morphine And Codeine Other (See Comments)    Severe drop in blood pressure   Sertraline Hcl Other (See Comments)    Headache   Past Surgical History:  Procedure Laterality Date   BIOPSY  05/24/2020   Procedure: BIOPSY;  Surgeon: Lemar Lofty., MD;  Location: [facility] ENDOSCOPY;  Service: Gastroenterology;;   CATARACT EXTRACTION, BILATERAL     COLONOSCOPY  2012   Dr Juanda Chance   COLONOSCOPY WITH PROPOFOL N/A 05/24/2020   Procedure: COLONOSCOPY WITH PROPOFOL;  Surgeon: Lemar Lofty., MD;  Location: [facility] ENDOSCOPY;  Service: Gastroenterology;  Laterality: N/A;   CYSTOSCOPY     for hematuria   EUS N/A 05/24/2020   Procedure: LOWER ENDOSCOPIC ULTRASOUND (EUS);  Surgeon: Lemar Lofty., MD;  Location: [facility] ENDOSCOPY;  Service: Gastroenterology;  Laterality: N/A;   hydrocoelectomy  2003   Dr Annabell Howells   KNEE ARTHROSCOPY Left 1994   OSTOMY N/A 06/21/2020   Procedure: POSSIBLE OSTOMY;  Surgeon: Karie Soda, MD;  Location: [facility] ORS;  Service: General;  Laterality: N/A;   POLYPECTOMY  05/24/2020   Procedure: POLYPECTOMY;  Surgeon: Lemar Lofty., MD;  Location: [facility] ENDOSCOPY;  Service: Gastroenterology;;    PROCTOSCOPY N/A 06/21/2020   Procedure: RIGID PROCTOSCOPY;  Surgeon: Karie Soda, MD;  Location: [facility] ORS;  Service: General;  Laterality: N/A;   prostate nodule resection  2003   TONSILLECTOMY     TOTAL KNEE ARTHROPLASTY  2009   TOTAL KNEE ARTHROPLASTY Right 06/27/2016  Procedure: TOTAL KNEE ARTHROPLASTY;  Surgeon: Tarry Kos, MD;  Location: [facility] OR;  Service: Orthopedics;  Laterality: Right;   XI ROBOTIC ASSISTED LOWER ANTERIOR RESECTION N/A 06/21/2020   Procedure: XI ROBOTIC ASSISTED LOWER ANTERIOR RECTOSIGMOID RESECTION, BILATERAL TAP BLOCK;  Surgeon: Karie Soda, MD;  Location: [facility] ORS;  Service: General;  Laterality: N/A;   Family History  Problem Relation Age of Onset   Dementia Mother    Lung cancer Father        smoker   Alcohol abuse Father    Tuberculosis Father    Early death Father    Heart attack Brother 45   Diabetes Other    Stroke Neg Hx    Thyroid disease Neg Hx    Colon cancer Neg Hx    Stomach cancer Neg Hx    Rectal cancer Neg Hx    Esophageal cancer Neg Hx    Inflammatory bowel disease Neg Hx    Liver disease Neg Hx    Pancreatic cancer Neg Hx    Colon polyps Neg Hx    Social History   Social History Narrative   Married. Wife's name is Clydie Braun. Part time employed, Psychologist, counselling.   Drinks caffeinated beverages. Take a multivitamin. Wears his seatbelt.   Exercises at least 3 times a week. Is not a strict vegetarian, but only eats meat 1-2 times a week   Wears a hearing aid. Does not wear dentures or use an assistive walking device.   There is a smoke detector in his home.   He feels safe in his relationships.    Allergies as of 09/15/2023       Reactions   Morphine And Codeine Other (See Comments)   Severe drop in blood pressure   Sertraline Hcl Other (See Comments)   Headache        Medication List        Accurate as of September 15, 2023 10:40 AM. If you have any questions, ask your nurse or doctor.          STOP taking these  medications    QC TUMERIC COMPLEX PO Stopped by: Felix Pacini       TAKE these medications    amLODipine 10 MG tablet Commonly known as: NORVASC Take 1 tablet (10 mg total) by mouth daily. Needs OV for refills   ascorbic acid 500 MG tablet Commonly known as: VITAMIN C Take 1,000 mg by mouth 2 (two) times daily.   b complex vitamins tablet Take 1 tablet by mouth every other day.   B-12 SL Place under the tongue.   D3 PO Take by mouth.   K2 PO Take by mouth.   KRILL OIL PO Take by mouth.   lisinopril 10 MG tablet Commonly known as: ZESTRIL Take 1 tablet (10 mg total) by mouth daily. Needs OV for refills   Tdap 5-2.5-18.5 LF-MCG/0.5 injection Commonly known as: BOOSTRIX Inject 0.5 mLs into the muscle once for 1 dose. Started by: Felix Pacini       All past medical history, surgical history, allergies, family history, immunizations andmedications were updated in the EMR today and reviewed under the history and medication portions of their EMR.     No results found for this or any previous visit (from the past 2160 hours).   No results found.  ROS: 14 pt review of systems performed and negative (unless mentioned in an HPI)  Objective: BP 123/70   Pulse 60   Ht 5'  8.75" (1.746 m)   Wt 190 lb 3.2 oz (86.3 kg)   SpO2 97%   BMI 28.29 kg/m  Physical Exam Constitutional:      General: He is not in acute distress.    Appearance: Normal appearance. He is not ill-appearing, toxic-appearing or diaphoretic.  HENT:     Head: Normocephalic and atraumatic.     Right Ear: Tympanic membrane, ear canal and external ear normal. There is no impacted cerumen.     Left Ear: Tympanic membrane, ear canal and external ear normal. There is no impacted cerumen.     Nose: Nose normal. No congestion or rhinorrhea.     Mouth/Throat:     Mouth: Mucous membranes are moist.     Pharynx: Oropharynx is clear. No oropharyngeal exudate or posterior oropharyngeal erythema.  Eyes:      General: No scleral icterus.       Right eye: No discharge.        Left eye: No discharge.     Extraocular Movements: Extraocular movements intact.     Pupils: Pupils are equal, round, and reactive to light.  Cardiovascular:     Rate and Rhythm: Normal rate and regular rhythm.     Pulses: Normal pulses.     Heart sounds: Normal heart sounds. No murmur heard.    No friction rub. No gallop.  Pulmonary:     Effort: Pulmonary effort is normal. No respiratory distress.     Breath sounds: Normal breath sounds. No stridor. No wheezing, rhonchi or rales.  Chest:     Chest wall: No tenderness.  Abdominal:     General: Abdomen is flat. Bowel sounds are normal. There is no distension.     Palpations: Abdomen is soft. There is no mass.     Tenderness: There is no abdominal tenderness. There is no right CVA tenderness, left CVA tenderness, guarding or rebound.     Hernia: No hernia is present.  Musculoskeletal:        General: No swelling or tenderness. Normal range of motion.     Cervical back: Normal range of motion and neck supple.     Right lower leg: No edema.     Left lower leg: No edema.  Lymphadenopathy:     Cervical: No cervical adenopathy.  Skin:    General: Skin is warm and dry.     Coloration: Skin is not jaundiced.     Findings: Bruising present. No lesion or rash.  Neurological:     General: No focal deficit present.     Mental Status: He is alert and oriented to person, place, and time. Mental status is at baseline.     Cranial Nerves: No cranial nerve deficit.     Sensory: No sensory deficit.     Motor: No weakness.     Coordination: Coordination normal.     Gait: Gait normal.     Deep Tendon Reflexes: Reflexes normal.  Psychiatric:        Mood and Affect: Mood normal.        Behavior: Behavior normal.        Thought Content: Thought content normal.        Judgment: Judgment normal.     No results found.  Assessment/plan: Nathan [name] is a 78 y.o. male present  for [facility] and Chronic Conditions/illness Management Essential hypertension, benign/Atherosclerosis of aorta (HCC)/Overweight (BMI 25.0-29.9)/Mixed hyperlipidemia/Statin declined/Atherosclerosis of aorta (HCC) We discussed genetics plays a role in cardiovascular health.  It is  not uncommon to eat healthy, exercise yet require blood pressure medications and cholesterol medications to maintain cardiac health as we age.   Recommend he continue his dietary changes with a heart healthy diet.  Continue to exercise with the benchmark of 150 minutes/week. continue amlodipine 10 mg qd Continue lisinopril at 10 mg qd. CV risk 30%> patient refuses cholesterol medications - CT CARDIAC(01/2022):29th % Cbc, cmp, tsh, lipids collected today  Diabetes mellitus screening/E66.3(BMI 25.0-29.9) - Hemoglobin A1c  Stenosis of right carotid artery - US Carotid Duplex Bilateral;(10/2022)> stable Now est with cardio and vasc.    Immunization declined Prostate cancer screening - PSA collected Refusal of treatment for reasons of religion or conscience-hyperlipidemia  Routine general health exam/yearly preventative exam: Patient was encouraged to exercise greater than 150 minutes a week. Patient was encouraged to choose a diet filled with fresh fruits and vegetables, and lean meats. AVS provided to patient today for education/recommendation on gender specific health and safety maintenance. Colonoscopy: last screen 07/2021. Rectal cancer s/p robotic LAR 06/2020..3 yr Immunizations:  tdap 2014 UTD-printed, influenza declines, PNA series declines, zostavax declines, covid declines Infectious disease screening:Hep C declines. PSA: Followed by urology, last appt was over a year ago.  Return in about 24 weeks (around 03/01/2024) for Routine chronic condition follow-up.   Orders Placed This Encounter  Procedures   CBC   Comprehensive metabolic panel   Hemoglobin A1c   Lipid panel   PSA, Medicare   TSH   Meds  ordered this encounter  Medications   amLODipine (NORVASC) 10 MG tablet    Sig: Take 1 tablet (10 mg total) by mouth daily. Needs OV for refills    Dispense:  90 tablet    Refill:  1   lisinopril (ZESTRIL) 10 MG tablet    Sig: Take 1 tablet (10 mg total) by mouth daily. Needs OV for refills    Dispense:  90 tablet    Refill:  1   Tdap (BOOSTRIX) 5-2.5-18.5 LF-MCG/0.5 injection    Sig: Inject 0.5 mLs into the muscle once for 1 dose.    Dispense:  0.5 mL    Refill:  0   Referral Orders  No referral(s) requested today     Note is dictated utilizing voice recognition software. Although note has been proof read prior to signing, occasional typographical errors still can be missed. If any questions arise, please do not hesitate to call for verification.  Electronically signed by: Felix Pacini, DO [facility]

## 2023-09-15 NOTE — Patient Instructions (Signed)

## 2023-09-16 ENCOUNTER — Encounter: Payer: Self-pay | Admitting: Family Medicine

## 2023-09-16 LAB — LIPID PANEL
Cholesterol: 312 mg/dL — ABNORMAL HIGH (ref 0–200)
HDL: 64.7 mg/dL (ref >39.00)
LDL Cholesterol: 224 mg/dL — ABNORMAL HIGH (ref 0–99)
NonHDL: 247.3
Total CHOL/HDL Ratio: 5
Triglycerides: 117 mg/dL (ref 0.0–149.0)
VLDL: 23.4 mg/dL (ref 0.0–40.0)

## 2023-09-16 LAB — COMPREHENSIVE METABOLIC PANEL
ALT: 16 U/L (ref 0–53)
AST: 20 U/L (ref 0–37)
Albumin: 4.3 g/dL (ref 3.5–5.2)
Alkaline Phosphatase: 55 U/L (ref 39–117)
BUN: 18 mg/dL (ref 6–23)
CO2: 28 meq/L (ref 19–32)
Calcium: 9.4 mg/dL (ref 8.4–10.5)
Chloride: 104 meq/L (ref 96–112)
Creatinine, Ser: 1.13 mg/dL (ref 0.40–1.50)
GFR: 62.59 mL/min (ref >60.00)
Glucose, Bld: 102 mg/dL — ABNORMAL HIGH (ref 70–99)
Potassium: 4.2 meq/L (ref 3.5–5.1)
Sodium: 141 meq/L (ref 135–145)
Total Bilirubin: 0.6 mg/dL (ref 0.2–1.2)
Total Protein: 6.8 g/dL (ref 6.0–8.3)

## 2023-10-13 DIAGNOSIS — M9901 Segmental and somatic dysfunction of cervical region: Secondary | ICD-10-CM | POA: Diagnosis not present

## 2023-10-13 DIAGNOSIS — M5417 Radiculopathy, lumbosacral region: Secondary | ICD-10-CM | POA: Diagnosis not present

## 2023-10-13 DIAGNOSIS — M546 Pain in thoracic spine: Secondary | ICD-10-CM | POA: Diagnosis not present

## 2023-10-13 DIAGNOSIS — M9903 Segmental and somatic dysfunction of lumbar region: Secondary | ICD-10-CM | POA: Diagnosis not present

## 2023-10-13 DIAGNOSIS — M62838 Other muscle spasm: Secondary | ICD-10-CM | POA: Diagnosis not present

## 2023-10-13 DIAGNOSIS — M50322 Other cervical disc degeneration at C5-C6 level: Secondary | ICD-10-CM | POA: Diagnosis not present

## 2023-10-13 DIAGNOSIS — M9902 Segmental and somatic dysfunction of thoracic region: Secondary | ICD-10-CM | POA: Diagnosis not present

## 2023-10-15 DIAGNOSIS — M9901 Segmental and somatic dysfunction of cervical region: Secondary | ICD-10-CM | POA: Diagnosis not present

## 2023-10-15 DIAGNOSIS — M62838 Other muscle spasm: Secondary | ICD-10-CM | POA: Diagnosis not present

## 2023-10-15 DIAGNOSIS — M546 Pain in thoracic spine: Secondary | ICD-10-CM | POA: Diagnosis not present

## 2023-10-15 DIAGNOSIS — M50322 Other cervical disc degeneration at C5-C6 level: Secondary | ICD-10-CM | POA: Diagnosis not present

## 2023-10-15 DIAGNOSIS — M5417 Radiculopathy, lumbosacral region: Secondary | ICD-10-CM | POA: Diagnosis not present

## 2023-10-15 DIAGNOSIS — M9903 Segmental and somatic dysfunction of lumbar region: Secondary | ICD-10-CM | POA: Diagnosis not present

## 2023-10-15 DIAGNOSIS — M9902 Segmental and somatic dysfunction of thoracic region: Secondary | ICD-10-CM | POA: Diagnosis not present

## 2023-10-20 DIAGNOSIS — M50322 Other cervical disc degeneration at C5-C6 level: Secondary | ICD-10-CM | POA: Diagnosis not present

## 2023-10-20 DIAGNOSIS — M9903 Segmental and somatic dysfunction of lumbar region: Secondary | ICD-10-CM | POA: Diagnosis not present

## 2023-10-20 DIAGNOSIS — M9901 Segmental and somatic dysfunction of cervical region: Secondary | ICD-10-CM | POA: Diagnosis not present

## 2023-10-20 DIAGNOSIS — M9902 Segmental and somatic dysfunction of thoracic region: Secondary | ICD-10-CM | POA: Diagnosis not present

## 2023-10-20 DIAGNOSIS — M5417 Radiculopathy, lumbosacral region: Secondary | ICD-10-CM | POA: Diagnosis not present

## 2023-10-20 DIAGNOSIS — M62838 Other muscle spasm: Secondary | ICD-10-CM | POA: Diagnosis not present

## 2023-10-20 DIAGNOSIS — M546 Pain in thoracic spine: Secondary | ICD-10-CM | POA: Diagnosis not present

## 2024-01-05 ENCOUNTER — Ambulatory Visit: Payer: Self-pay | Admitting: *Deleted

## 2024-01-05 NOTE — Telephone Encounter (Signed)
Pt scheduled. No further action needed at this time.

## 2024-01-05 NOTE — Telephone Encounter (Signed)
 FYI Only or Action Required?: Action required by provider: request for appointment.  Patient was last seen in primary care on 09/15/2023 by Catherine Fuller A, DO. Called Nurse Triage reporting Fatigue. Symptoms began several days ago. Interventions attempted: Rest, hydration, or home remedies. Symptoms are: stable.  Triage Disposition: See Physician Within 24 Hours  Patient/caregiver understands and will follow disposition?: Yes               Copied from CRM [id number]. Topic: Clinical - Red Word Triage >> Jan 05, 2024 10:58 AM Nathan [name] GRADE wrote: Red Word that prompted transfer to Nurse Triage: Patient has been experiencing tiredness and fatigue for the last 3-4 days. He's experienced this once before while dealing with an ulcer. Reason for Disposition  [1] MODERATE weakness (i.e., interferes with work, school, normal activities) AND [2] persists > 3 days  Answer Assessment - Initial Assessment Questions 1. DESCRIPTION: Describe how you are feeling.     Very tired fatigue last week 3-4 days ago  2. SEVERITY: How bad is it?  Can you stand and walk?   - MILD (0-3): Feels weak or tired, but does not interfere with work, school or normal activities.   - MODERATE (4-7): Able to stand and walk; weakness interferes with work, school, or normal activities.   - SEVERE (8-10): Unable to stand or walk; unable to do usual activities.     Feels better today but concerned regarding issues of ulcer in the past and feels the same  3. ONSET: When did these symptoms begin? (e.g., hours, days, weeks, months)     3-4 days ago  4. CAUSE: What do you think is causing the weakness or fatigue? (e.g., not drinking enough fluids, medical problem, trouble sleeping)     Air conditioning broke last week but fixed now , has trouble sleeping  5. NEW MEDICINES:  Have you started on any new medicines recently? (e.g., opioid pain medicines, benzodiazepines, muscle relaxants, antidepressants, antihistamines,  neuroleptics, beta blockers)     No  6. OTHER SYMPTOMS: Do you have any other symptoms? (e.g., chest pain, fever, cough, SOB, vomiting, diarrhea, bleeding, other areas of pain)     Fatigue better today due to air conditioner fixed but still feeling tired. 7. PREGNANCY: Is there any chance you are pregnant? When was your last menstrual period?     Na   Appt scheduled tomorrow due to fatigue and weakness last week felt like previous time with ulcer. No blood noted in urine or stool. Recommended to stay hydrated and if feeling like passing out go to ED.  Protocols used: Weakness (Generalized) and Fatigue-A-AH

## 2024-01-06 ENCOUNTER — Ambulatory Visit (INDEPENDENT_AMBULATORY_CARE_PROVIDER_SITE_OTHER): Admitting: Family Medicine

## 2024-01-06 ENCOUNTER — Encounter: Payer: Self-pay | Admitting: Family Medicine

## 2024-01-06 VITALS — BP 146/78 | HR 59 | Temp 98.5°F | Wt 193.0 lb

## 2024-01-06 DIAGNOSIS — R5383 Other fatigue: Secondary | ICD-10-CM

## 2024-01-06 DIAGNOSIS — R531 Weakness: Secondary | ICD-10-CM | POA: Diagnosis not present

## 2024-01-06 NOTE — Progress Notes (Signed)
 Patient ID: Nathan [name], male  DOB: [birth year], 78 y.o.   MRN: [id number] Patient Care Team    Relationship Specialty Notifications Start End  Catherine Charlies LABOR, DO PCP - General Family Medicine  12/07/15   Pa, [facility]    06/02/17   Jerri Kay HERO, MD Attending Physician Orthopedic Surgery  06/02/17   Cloretta Arley NOVAK, MD Consulting Physician Oncology  05/29/20   Sheldon Standing, MD Consulting Physician General Surgery  06/19/20   Monetta Redell PARAS, MD Consulting Physician Cardiology  06/19/20   Armbruster, Standing SQUIBB, MD Consulting Physician Gastroenterology  06/26/20   Alvia Olam BIRCH, RN [facility]   03/22/22   Magda Debby SAILOR, MD Consulting Physician Vascular Surgery  09/15/23   Lavona Agent, MD Consulting Physician Cardiology  09/15/23     Chief Complaint  Patient presents with   Fatigue    Increased fatigue for 1 week; fatigue, general weakness. Has been experiencing sleep disturbance due to broken AC.    Subjective: Nathan [name] is a 78 y.o. male present for fatigue and generalized weakness That started last Thursday.  He reports symptoms came on abruptly felt like he had no energy at all.  He compares it to the time he was found to be extremely iron deficient anemic more the time he was found to be hyperthyroid. He does endorse his air conditioning unit had malfunctioned and when he had fixed Saturday evening, he woke up Sunday feeling improved.  He slept better.  He had not been sleeping well with the air conditioning unit however the last week. He denies fevers chills or GI symptoms.  He denies any blood per rectum.  Denies any palpitations. 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 RF: hypertension, hyperlipidemia,  former smoker, fhx heart disease, BMI>25 Patient has a history of right carotid artery stenosis. 11/27/2022: cardiac telemetry by cardio> No sustained arrhythmias.  01/2022: Coronary Calcium  score FINDINGS: Coronary arteries: Normal origins. Coronary Calcium  Score: Left main: 2 Left anterior descending artery: 62 Left circumflex artery: 8 Right coronary artery: 16 Total: 88 Percentile: 29 Pericardium: Normal. Aorta: Normal caliber of ascending aorta. Aortic atherosclerosis noted. Non-cardiac: See separate report from [facility]. IMPRESSION: Coronary calcium  score of 88. This was 29th percentile for age-, race-, and sex-matched controls.    Carotid duplex 01/2022:  IMPRESSION: Right: Heterogeneous and partially calcified plaque at the right carotid bifurcation contributes to 50%-69% stenosis by established duplex criteria. Left: Color duplex indicates moderate heterogeneous and calcified plaque, with no hemodynamically significant stenosis by duplex criteria in the extracranial cerebrovascular circulation.    01/26/2019 carotid vascular ultrasound: IMPRESSION: 1. Moderate (50-69%) stenosis proximal right internal carotid artery secondary to heterogeneous and mildly irregular atherosclerotic plaque. 2. Mild (1-49%) stenosis proximal left internal carotid artery secondary to heterogeneous and slightly irregular atherosclerotic plaque. 3. Flow reversal in the left vertebral artery consistent with subclavian steal in the setting of a hemodynamically significant stenosis or occlusion of the proximal subclavian artery. 4. Normal antegrade flow in the right vertebral artery  Echo 02/11/2019:  1. The left ventricle has normal systolic function, with an ejection  fraction of 60-65%. The cavity size was normal. There is mildly increased  left ventricular wall thickness. Left ventricular diastolic parameters  were normal.   2.  The right ventricle has normal systolc  function. The cavity was mildly  enlarged. There is no increase in right ventricular wall thickness. Right  ventricular systolic pressure is normal with an estimated pressure of 28.0  mmHg.   3. Aortic valve regurgitation was not assessed by color flow Doppler.   4. Pulmonic valve regurgitation was not assessed by color flow Doppler.   02/16/2019-myocardial perfusion scan: The left ventricular ejection fraction is normal (55-65%). Nuclear stress EF: 55%. There was no ST segment deviation noted during stress. Defect 1: There is a small defect of mild severity present in the apex location. The study is normal. This is a low risk study.   Normal stress nuclear study with mild apical thinning but no ischemia.  Gated ejection fraction 55% with normal wall motion.      09/15/2023   10:01 AM 12/24/2022    9:44 AM 05/22/2022   10:31 AM 03/22/2022    2:48 PM 10/24/2021    8:20 AM  Depression screen PHQ 2/9  Decreased Interest 0 1 1 0 0  Down, Depressed, Hopeless 0 0  0 1  PHQ - 2 Score 0 1 1 0 1  Altered sleeping  0 3    Tired, decreased energy  1 3    Change in appetite  2 0    Feeling bad or failure about yourself   0 0    Trouble concentrating  0 0    Moving slowly or fidgety/restless  0 0    Suicidal thoughts  0 0    PHQ-9 Score  4 7    Difficult doing work/chores  Somewhat difficult         12/24/2022    9:44 AM 05/22/2022   10:31 AM 08/24/2015    2:57 PM  GAD 7 : Generalized Anxiety Score  Nervous, Anxious, on Edge 0 2 2  Control/stop worrying 0 2 3  Worry too much - different things 0 2 3  Trouble relaxing 0 1 1  Restless  0 0  Easily annoyed or irritable 0 2 2  Afraid - awful might happen 0 1 0  Total GAD 7 Score  10 11  Anxiety Difficulty Not difficult at all  Somewhat difficult        09/15/2023   10:01 AM 12/24/2022    9:43 AM 10/24/2021    8:23 AM 04/12/2021    8:30 AM 09/20/2020    9:11 AM  Fall Risk   Falls in the past year? 0 0 0 0 1  Number falls in past yr: 0 0 0  0 0  Injury with Fall? 0 0 0 0 0  Risk for fall due to : No Fall Risks No Fall Risks Impaired balance/gait  History of fall(s)  Risk for fall due to: Comment   not like it used to be    Follow up Falls evaluation completed Falls evaluation completed Falls prevention discussed  Falls evaluation completed  Falls prevention discussed      Data saved with a previous flowsheet row definition   Immunization History  Administered Date(s) Administered   PFIZER(Purple Top)SARS-COV-2 Vaccination 09/20/2019, 10/20/2019, 07/03/2020   PPD Test 03/17/2013   Tdap 07/15/2012   Zoster, Live 02/06/2015   Past Medical History:  Diagnosis Date   Adrenal mass (HCC) 2018   Urology fully evaluated; benign   Allergy    hay fever   Anemia    resolved spontaneously   Anxiety    Cataract  Cervicalgia    Complication of anesthesia    hypoglycemia, low blood pressure, pt. reports it dropped to zero- post op MORPHINE  , then taken ICU due to either the morphine  or hypoglycemia . In addition post op from cataract surgery- 07/2015, experienced an episode of hypoglycemia     Depressive disorder, not elsewhere classified    Diverticulosis    Elevated prostate specific antigen (PSA) 2008   Dr Watt   Fecal occult blood test positive 02/07/2020   Gallstone    GERD (gastroesophageal reflux disease)    H/O cardiovascular stress test >10 yrs.   told that its wnl. Told to drink less coffee   Hematuria 2018   Hematuria with enlarged prostate, kidney cyst, adrenal adenoma--> followed by urology with cystoscopy and repeat CT, benign workup.   Hx of migraines    Hypertension    Hyperthyroidism    Treated with medication no longer needed at this time   Hypoglycemia    IBS (irritable bowel syndrome)    patient not aware   Inguinal hernia 12/2015   Bilateral small indirect hernias on CT, umbilical hernia also present.   Kidney cyst, acquired 2018   Followed by urology, benign.   Low back pain    Macular  degeneration (senile) of retina, unspecified    2 different opinions from 2 different Ophth   Osteoarthrosis, unspecified whether generalized or localized, unspecified site    Other and unspecified hyperlipidemia    Prostatitis 2008   Rectal cancer (HCC)    Refusal of blood transfusions as patient is Jehovah's Witness    Segmental and somatic dysfunction of cervical region    Segmental and somatic dysfunction of lumbar region    Segmental and somatic dysfunction of sacral region    Segmental and somatic dysfunction of thoracic region    Spondylosis of lumbar spine    Strain of pelvis    Tuberculosis    had exposure as child tests positive on skin test.   Allergies  Allergen Reactions   Morphine  And Codeine  Other (See Comments)    Severe drop in blood pressure   Sertraline Hcl Other (See Comments)    Headache   Past Surgical History:  Procedure Laterality Date   BIOPSY  05/24/2020   Procedure: BIOPSY;  Surgeon: Wilhelmenia Aloha Raddle., MD;  Location: [facility] ENDOSCOPY;  Service: Gastroenterology;;   CATARACT EXTRACTION, BILATERAL     COLONOSCOPY  2012   Dr Obie   COLONOSCOPY WITH PROPOFOL  N/A 05/24/2020   Procedure: COLONOSCOPY WITH PROPOFOL ;  Surgeon: Wilhelmenia Aloha Raddle., MD;  Location: [facility] ENDOSCOPY;  Service: Gastroenterology;  Laterality: N/A;   CYSTOSCOPY     for hematuria   EUS N/A 05/24/2020   Procedure: LOWER ENDOSCOPIC ULTRASOUND (EUS);  Surgeon: Wilhelmenia Aloha Raddle., MD;  Location: [facility] ENDOSCOPY;  Service: Gastroenterology;  Laterality: N/A;   hydrocoelectomy  2003   Dr Watt   KNEE ARTHROSCOPY Left 1994   OSTOMY N/A 06/21/2020   Procedure: POSSIBLE OSTOMY;  Surgeon: Sheldon Standing, MD;  Location: [facility] ORS;  Service: General;  Laterality: N/A;   POLYPECTOMY  05/24/2020   Procedure: POLYPECTOMY;  Surgeon: Wilhelmenia Aloha Raddle., MD;  Location: [facility] ENDOSCOPY;  Service: Gastroenterology;;   PROCTOSCOPY N/A 06/21/2020   Procedure: RIGID PROCTOSCOPY;  Surgeon: Sheldon Standing, MD;  Location: [facility] ORS;  Service: General;  Laterality: N/A;   prostate nodule resection  2003   TONSILLECTOMY     TOTAL KNEE ARTHROPLASTY  2009   TOTAL KNEE ARTHROPLASTY Right 06/27/2016  Procedure: TOTAL KNEE ARTHROPLASTY;  Surgeon: Kay CHRISTELLA Cummins, MD;  Location: [facility] OR;  Service: Orthopedics;  Laterality: Right;   XI ROBOTIC ASSISTED LOWER ANTERIOR RESECTION N/A 06/21/2020   Procedure: XI ROBOTIC ASSISTED LOWER ANTERIOR RECTOSIGMOID RESECTION, BILATERAL TAP BLOCK;  Surgeon: Sheldon Standing, MD;  Location: [facility] ORS;  Service: General;  Laterality: N/A;   Family History  Problem Relation Age of Onset   Dementia Mother    Lung cancer Father        smoker   Alcohol abuse Father    Tuberculosis Father    Early death Father    Heart attack Brother 66   Diabetes Other    Stroke Neg Hx    Thyroid  disease Neg Hx    Colon cancer Neg Hx    Stomach cancer Neg Hx    Rectal cancer Neg Hx    Esophageal cancer Neg Hx    Inflammatory bowel disease Neg Hx    Liver disease Neg Hx    Pancreatic cancer Neg Hx    Colon polyps Neg Hx    Social History   Social History Narrative   Married. Wife's name is Darice. Part time employed, Psychologist, counselling.   Drinks caffeinated beverages. Take a multivitamin. Wears his seatbelt.   Exercises at least 3 times a week. Is not a strict vegetarian, but only eats meat 1-2 times a week   Wears a hearing aid. Does not wear dentures or use an assistive walking device.   There is a smoke detector in his home.   He feels safe in his relationships.    Allergies as of 01/06/2024       Reactions   Morphine  And Codeine  Other (See Comments)   Severe drop in blood pressure   Sertraline Hcl Other (See Comments)   Headache        Medication List        Accurate as of January 06, 2024  2:38 PM. If you have any questions, ask your nurse or doctor.          amLODipine  10 MG tablet Commonly known as: NORVASC  Take 1 tablet (10 mg total) by mouth  daily. Needs OV for refills   ascorbic acid 500 MG tablet Commonly known as: VITAMIN C Take 1,000 mg by mouth 2 (two) times daily.   b complex vitamins tablet Take 1 tablet by mouth every other day.   B-12 SL Place under the tongue.   D3 PO Take by mouth.   K2 PO Take by mouth.   KRILL OIL PO Take by mouth.   lisinopril  10 MG tablet Commonly known as: ZESTRIL  Take 1 tablet (10 mg total) by mouth daily. Needs OV for refills       All past medical history, surgical history, allergies, family history, immunizations andmedications were updated in the EMR today and reviewed under the history and medication portions of their EMR.     No results found for this or any previous visit (from the past 2160 hours).   No results found.  ROS: 14 pt review of systems performed and negative (unless mentioned in an HPI)  Objective: BP (!) 146/78   Pulse (!) 59   Temp 98.5 F (36.9 C)   Wt 193 lb (87.5 kg)   SpO2 98%   BMI 28.71 kg/m  Physical Exam Vitals and nursing note reviewed. Exam conducted with a chaperone present.  Constitutional:      General: He is not in acute distress.  Appearance: Normal appearance. He is not ill-appearing, toxic-appearing or diaphoretic.  HENT:     Head: Normocephalic and atraumatic.   Eyes:     General: No scleral icterus.       Right eye: No discharge.        Left eye: No discharge.     Extraocular Movements: Extraocular movements intact.     Pupils: Pupils are equal, round, and reactive to light.   Neck:     Comments: No thyromegaly Cardiovascular:     Rate and Rhythm: Normal rate and regular rhythm.     Heart sounds: No murmur heard. Pulmonary:     Effort: Pulmonary effort is normal. No respiratory distress.     Breath sounds: Normal breath sounds. No wheezing, rhonchi or rales.   Musculoskeletal:     Cervical back: Neck supple. No tenderness.     Right lower leg: No edema.     Left lower leg: No edema.  Lymphadenopathy:      Cervical: No cervical adenopathy.   Skin:    General: Skin is warm.     Findings: No rash.   Neurological:     Mental Status: He is alert and oriented to person, place, and time. Mental status is at baseline.   Psychiatric:        Mood and Affect: Mood normal.        Behavior: Behavior normal.        Thought Content: Thought content normal.        Judgment: Judgment normal.     No results found.  Assessment/plan: CUTBERTO WINFREE is a 78 y.o. male present for  General weakness (Primary)/fatigue Hydrate well.  He had seen some improvement now that he has a functioning air conditioning unit sleeping better since Saturday night.  To be safe we elected to move forward with some labs to rule out iron deficiency anemia and thyroid  disorder, since symptoms were similar compared to when he had discrepancies in those conditions. - CBC w/Diff - Comp Met (CMET) - TSH - Iron, TIBC and Ferritin Panel - T4, free Follow-up dependent upon laboratory results  No follow-ups on file.   Orders Placed This Encounter  Procedures   CBC w/Diff   Comp Met (CMET)   TSH   Iron, TIBC and Ferritin Panel   T4, free   No orders of the defined types were placed in this encounter.  Referral Orders  No referral(s) requested today     Note is dictated utilizing voice recognition software. Although note has been proof read prior to signing, occasional typographical errors still can be missed. If any questions arise, please do not hesitate to call for verification.  Electronically signed by: Charlies Bellini, DO [facility] [place]

## 2024-01-06 NOTE — Patient Instructions (Signed)

## 2024-01-07 ENCOUNTER — Ambulatory Visit: Payer: Self-pay | Admitting: Family Medicine

## 2024-01-07 LAB — CBC WITH DIFFERENTIAL/PLATELET
Absolute Lymphocytes: 2916 {cells}/uL (ref 850–3900)
Absolute Monocytes: 580 {cells}/uL (ref 200–950)
Basophils Absolute: 37 {cells}/uL (ref 0–200)
Basophils Relative: 0.4 %
Eosinophils Absolute: 184 {cells}/uL (ref 15–500)
Eosinophils Relative: 2 %
HCT: 46 % (ref 38.5–50.0)
Hemoglobin: 14.7 g/dL (ref 13.2–17.1)
MCH: 30.7 pg (ref 27.0–33.0)
MCHC: 32 g/dL (ref 32.0–36.0)
MCV: 96 fL (ref 80.0–100.0)
MPV: 10.3 fL (ref 7.5–12.5)
Monocytes Relative: 6.3 %
Neutro Abs: 5483 {cells}/uL (ref 1500–7800)
Neutrophils Relative %: 59.6 %
Platelets: 278 10*3/uL (ref 140–400)
RBC: 4.79 10*6/uL (ref 4.20–5.80)
RDW: 14.2 % (ref 11.0–15.0)
Total Lymphocyte: 31.7 %
WBC: 9.2 10*3/uL (ref 3.8–10.8)

## 2024-01-07 LAB — COMPREHENSIVE METABOLIC PANEL WITH GFR
AG Ratio: 1.9 (calc) (ref 1.0–2.5)
ALT: 14 U/L (ref 9–46)
AST: 16 U/L (ref 10–35)
Albumin: 4.2 g/dL (ref 3.6–5.1)
Alkaline phosphatase (APISO): 61 U/L (ref 35–144)
BUN: 24 mg/dL (ref 7–25)
CO2: 27 mmol/L (ref 20–32)
Calcium: 9.3 mg/dL (ref 8.6–10.3)
Chloride: 106 mmol/L (ref 98–110)
Creat: 1.1 mg/dL (ref 0.70–1.28)
Globulin: 2.2 g/dL (ref 1.9–3.7)
Glucose, Bld: 120 mg/dL — ABNORMAL HIGH (ref 65–99)
Potassium: 3.9 mmol/L (ref 3.5–5.3)
Sodium: 141 mmol/L (ref 135–146)
Total Bilirubin: 0.5 mg/dL (ref 0.2–1.2)
Total Protein: 6.4 g/dL (ref 6.1–8.1)
eGFR: 69 mL/min/{1.73_m2} (ref >=60)

## 2024-01-07 LAB — IRON,TIBC AND FERRITIN PANEL
%SAT: 25 % (ref 20–48)
Ferritin: 36 ng/mL (ref 24–380)
Iron: 86 ug/dL (ref 50–180)
TIBC: 339 ug/dL (ref 250–425)

## 2024-01-07 LAB — T4, FREE: Free T4: 1.3 ng/dL (ref 0.8–1.8)

## 2024-01-07 LAB — TSH: TSH: 1.52 m[IU]/L (ref 0.40–4.50)

## 2024-01-21 DIAGNOSIS — M9904 Segmental and somatic dysfunction of sacral region: Secondary | ICD-10-CM | POA: Diagnosis not present

## 2024-01-21 DIAGNOSIS — M9903 Segmental and somatic dysfunction of lumbar region: Secondary | ICD-10-CM | POA: Diagnosis not present

## 2024-01-21 DIAGNOSIS — M62838 Other muscle spasm: Secondary | ICD-10-CM | POA: Diagnosis not present

## 2024-01-21 DIAGNOSIS — M9901 Segmental and somatic dysfunction of cervical region: Secondary | ICD-10-CM | POA: Diagnosis not present

## 2024-01-21 DIAGNOSIS — M9902 Segmental and somatic dysfunction of thoracic region: Secondary | ICD-10-CM | POA: Diagnosis not present

## 2024-01-21 DIAGNOSIS — M5451 Vertebrogenic low back pain: Secondary | ICD-10-CM | POA: Diagnosis not present

## 2024-01-21 DIAGNOSIS — S336XXA Sprain of sacroiliac joint, initial encounter: Secondary | ICD-10-CM | POA: Diagnosis not present

## 2024-01-21 DIAGNOSIS — M50321 Other cervical disc degeneration at C4-C5 level: Secondary | ICD-10-CM | POA: Diagnosis not present

## 2024-01-21 DIAGNOSIS — M9906 Segmental and somatic dysfunction of lower extremity: Secondary | ICD-10-CM | POA: Diagnosis not present

## 2024-01-26 DIAGNOSIS — M50321 Other cervical disc degeneration at C4-C5 level: Secondary | ICD-10-CM | POA: Diagnosis not present

## 2024-01-26 DIAGNOSIS — M9902 Segmental and somatic dysfunction of thoracic region: Secondary | ICD-10-CM | POA: Diagnosis not present

## 2024-01-26 DIAGNOSIS — M9903 Segmental and somatic dysfunction of lumbar region: Secondary | ICD-10-CM | POA: Diagnosis not present

## 2024-01-26 DIAGNOSIS — M9901 Segmental and somatic dysfunction of cervical region: Secondary | ICD-10-CM | POA: Diagnosis not present

## 2024-01-26 DIAGNOSIS — S336XXA Sprain of sacroiliac joint, initial encounter: Secondary | ICD-10-CM | POA: Diagnosis not present

## 2024-01-26 DIAGNOSIS — M5451 Vertebrogenic low back pain: Secondary | ICD-10-CM | POA: Diagnosis not present

## 2024-01-26 DIAGNOSIS — M9906 Segmental and somatic dysfunction of lower extremity: Secondary | ICD-10-CM | POA: Diagnosis not present

## 2024-01-26 DIAGNOSIS — M62838 Other muscle spasm: Secondary | ICD-10-CM | POA: Diagnosis not present

## 2024-01-26 DIAGNOSIS — M9904 Segmental and somatic dysfunction of sacral region: Secondary | ICD-10-CM | POA: Diagnosis not present

## 2024-02-02 DIAGNOSIS — M9902 Segmental and somatic dysfunction of thoracic region: Secondary | ICD-10-CM | POA: Diagnosis not present

## 2024-02-02 DIAGNOSIS — M62838 Other muscle spasm: Secondary | ICD-10-CM | POA: Diagnosis not present

## 2024-02-02 DIAGNOSIS — S336XXA Sprain of sacroiliac joint, initial encounter: Secondary | ICD-10-CM | POA: Diagnosis not present

## 2024-02-02 DIAGNOSIS — M9904 Segmental and somatic dysfunction of sacral region: Secondary | ICD-10-CM | POA: Diagnosis not present

## 2024-02-02 DIAGNOSIS — M50321 Other cervical disc degeneration at C4-C5 level: Secondary | ICD-10-CM | POA: Diagnosis not present

## 2024-02-02 DIAGNOSIS — M5451 Vertebrogenic low back pain: Secondary | ICD-10-CM | POA: Diagnosis not present

## 2024-02-02 DIAGNOSIS — M9906 Segmental and somatic dysfunction of lower extremity: Secondary | ICD-10-CM | POA: Diagnosis not present

## 2024-02-02 DIAGNOSIS — M9901 Segmental and somatic dysfunction of cervical region: Secondary | ICD-10-CM | POA: Diagnosis not present

## 2024-02-02 DIAGNOSIS — M9903 Segmental and somatic dysfunction of lumbar region: Secondary | ICD-10-CM | POA: Diagnosis not present

## 2024-02-04 DIAGNOSIS — S336XXA Sprain of sacroiliac joint, initial encounter: Secondary | ICD-10-CM | POA: Diagnosis not present

## 2024-02-04 DIAGNOSIS — M9904 Segmental and somatic dysfunction of sacral region: Secondary | ICD-10-CM | POA: Diagnosis not present

## 2024-02-04 DIAGNOSIS — M9902 Segmental and somatic dysfunction of thoracic region: Secondary | ICD-10-CM | POA: Diagnosis not present

## 2024-02-04 DIAGNOSIS — M5451 Vertebrogenic low back pain: Secondary | ICD-10-CM | POA: Diagnosis not present

## 2024-02-04 DIAGNOSIS — M50321 Other cervical disc degeneration at C4-C5 level: Secondary | ICD-10-CM | POA: Diagnosis not present

## 2024-02-04 DIAGNOSIS — M9901 Segmental and somatic dysfunction of cervical region: Secondary | ICD-10-CM | POA: Diagnosis not present

## 2024-02-04 DIAGNOSIS — M62838 Other muscle spasm: Secondary | ICD-10-CM | POA: Diagnosis not present

## 2024-02-04 DIAGNOSIS — M9906 Segmental and somatic dysfunction of lower extremity: Secondary | ICD-10-CM | POA: Diagnosis not present

## 2024-02-04 DIAGNOSIS — M9903 Segmental and somatic dysfunction of lumbar region: Secondary | ICD-10-CM | POA: Diagnosis not present

## 2024-02-06 DIAGNOSIS — M9901 Segmental and somatic dysfunction of cervical region: Secondary | ICD-10-CM | POA: Diagnosis not present

## 2024-02-06 DIAGNOSIS — M62838 Other muscle spasm: Secondary | ICD-10-CM | POA: Diagnosis not present

## 2024-02-06 DIAGNOSIS — M9904 Segmental and somatic dysfunction of sacral region: Secondary | ICD-10-CM | POA: Diagnosis not present

## 2024-02-06 DIAGNOSIS — S336XXA Sprain of sacroiliac joint, initial encounter: Secondary | ICD-10-CM | POA: Diagnosis not present

## 2024-02-06 DIAGNOSIS — M5451 Vertebrogenic low back pain: Secondary | ICD-10-CM | POA: Diagnosis not present

## 2024-02-06 DIAGNOSIS — M9903 Segmental and somatic dysfunction of lumbar region: Secondary | ICD-10-CM | POA: Diagnosis not present

## 2024-02-06 DIAGNOSIS — M9902 Segmental and somatic dysfunction of thoracic region: Secondary | ICD-10-CM | POA: Diagnosis not present

## 2024-02-06 DIAGNOSIS — M50321 Other cervical disc degeneration at C4-C5 level: Secondary | ICD-10-CM | POA: Diagnosis not present

## 2024-02-06 DIAGNOSIS — M9906 Segmental and somatic dysfunction of lower extremity: Secondary | ICD-10-CM | POA: Diagnosis not present

## 2024-02-09 DIAGNOSIS — M50321 Other cervical disc degeneration at C4-C5 level: Secondary | ICD-10-CM | POA: Diagnosis not present

## 2024-02-09 DIAGNOSIS — M9906 Segmental and somatic dysfunction of lower extremity: Secondary | ICD-10-CM | POA: Diagnosis not present

## 2024-02-09 DIAGNOSIS — M9903 Segmental and somatic dysfunction of lumbar region: Secondary | ICD-10-CM | POA: Diagnosis not present

## 2024-02-09 DIAGNOSIS — M9902 Segmental and somatic dysfunction of thoracic region: Secondary | ICD-10-CM | POA: Diagnosis not present

## 2024-02-09 DIAGNOSIS — M5451 Vertebrogenic low back pain: Secondary | ICD-10-CM | POA: Diagnosis not present

## 2024-02-09 DIAGNOSIS — S336XXA Sprain of sacroiliac joint, initial encounter: Secondary | ICD-10-CM | POA: Diagnosis not present

## 2024-02-09 DIAGNOSIS — M9901 Segmental and somatic dysfunction of cervical region: Secondary | ICD-10-CM | POA: Diagnosis not present

## 2024-02-09 DIAGNOSIS — M62838 Other muscle spasm: Secondary | ICD-10-CM | POA: Diagnosis not present

## 2024-02-09 DIAGNOSIS — M9904 Segmental and somatic dysfunction of sacral region: Secondary | ICD-10-CM | POA: Diagnosis not present

## 2024-02-20 DIAGNOSIS — M9906 Segmental and somatic dysfunction of lower extremity: Secondary | ICD-10-CM | POA: Diagnosis not present

## 2024-02-20 DIAGNOSIS — M9901 Segmental and somatic dysfunction of cervical region: Secondary | ICD-10-CM | POA: Diagnosis not present

## 2024-02-20 DIAGNOSIS — M25552 Pain in left hip: Secondary | ICD-10-CM | POA: Diagnosis not present

## 2024-02-20 DIAGNOSIS — S336XXA Sprain of sacroiliac joint, initial encounter: Secondary | ICD-10-CM | POA: Diagnosis not present

## 2024-02-20 DIAGNOSIS — M50321 Other cervical disc degeneration at C4-C5 level: Secondary | ICD-10-CM | POA: Diagnosis not present

## 2024-02-20 DIAGNOSIS — M9903 Segmental and somatic dysfunction of lumbar region: Secondary | ICD-10-CM | POA: Diagnosis not present

## 2024-02-20 DIAGNOSIS — M5451 Vertebrogenic low back pain: Secondary | ICD-10-CM | POA: Diagnosis not present

## 2024-02-20 DIAGNOSIS — M9902 Segmental and somatic dysfunction of thoracic region: Secondary | ICD-10-CM | POA: Diagnosis not present

## 2024-02-20 DIAGNOSIS — M9904 Segmental and somatic dysfunction of sacral region: Secondary | ICD-10-CM | POA: Diagnosis not present

## 2024-02-20 DIAGNOSIS — M62838 Other muscle spasm: Secondary | ICD-10-CM | POA: Diagnosis not present

## 2024-02-20 NOTE — Progress Notes (Signed)
 Nathan [name] comes in today complaining of pain in his left hip.  He has had this for a couple months and is not really getting any better.  He does say that and runs at about 7 out of 10.  Interestingly it seems to get better when he walks rather than when he is sitting around.  He says that sitting or for any length of time actually does cause him some significant groin pain.  On examination is a pleasant male in no acute distress.  He is alert awake and orientated.  He does have a slight left antalgic gait.  Range of motion of his hip is actually pretty good with about 20 degrees of internal rotation.  He has no tenderness around his trochanteric region.  He has a negative straight leg raise.  He has no tenderness around his origin of his quadriceps or along his quadriceps.  Neurovascularly his lower extremity is totally intact.  X-rays: X-rays taken today show some early arthritis of the left hip.  There is definitely significant narrowing of the joint space in the superolateral aspect of the hip.  He does have about a dime sized opacity in his intertrochanteric area.  I am not quite sure what this is but it does seem to be well-circumscribed scribed.  Plan: At this stage I am going to put him on some meloxicam and see if that helps.  I am also going to send him for an MRI to make sure that there is not any type of malignancy in his hip and I am going to see him back in about 4 weeks.  I explained all this to the patient and he understands what is going on.  I will see him back in about 1 month.

## 2024-02-25 DIAGNOSIS — S336XXA Sprain of sacroiliac joint, initial encounter: Secondary | ICD-10-CM | POA: Diagnosis not present

## 2024-02-25 DIAGNOSIS — M62838 Other muscle spasm: Secondary | ICD-10-CM | POA: Diagnosis not present

## 2024-02-25 DIAGNOSIS — M9903 Segmental and somatic dysfunction of lumbar region: Secondary | ICD-10-CM | POA: Diagnosis not present

## 2024-02-25 DIAGNOSIS — M5451 Vertebrogenic low back pain: Secondary | ICD-10-CM | POA: Diagnosis not present

## 2024-02-25 DIAGNOSIS — M9901 Segmental and somatic dysfunction of cervical region: Secondary | ICD-10-CM | POA: Diagnosis not present

## 2024-02-25 DIAGNOSIS — M50321 Other cervical disc degeneration at C4-C5 level: Secondary | ICD-10-CM | POA: Diagnosis not present

## 2024-02-25 DIAGNOSIS — M9904 Segmental and somatic dysfunction of sacral region: Secondary | ICD-10-CM | POA: Diagnosis not present

## 2024-02-25 DIAGNOSIS — M9906 Segmental and somatic dysfunction of lower extremity: Secondary | ICD-10-CM | POA: Diagnosis not present

## 2024-02-25 DIAGNOSIS — M9902 Segmental and somatic dysfunction of thoracic region: Secondary | ICD-10-CM | POA: Diagnosis not present

## 2024-03-10 DIAGNOSIS — C419 Malignant neoplasm of bone and articular cartilage, unspecified: Secondary | ICD-10-CM | POA: Diagnosis not present

## 2024-03-16 ENCOUNTER — Other Ambulatory Visit: Payer: Self-pay | Admitting: Family Medicine

## 2024-03-20 ENCOUNTER — Other Ambulatory Visit: Payer: Self-pay | Admitting: Family Medicine

## 2024-03-23 DIAGNOSIS — M25552 Pain in left hip: Secondary | ICD-10-CM | POA: Diagnosis not present

## 2024-03-23 NOTE — Progress Notes (Signed)
 Nathan [name] comes in today and is actually doing much better.  His pain rating is about a 2 and he seems to be much better than it was 1 or so in the last time.  He has had his MRI and really wants to come in and discuss the results of that.  On examination is a pleasant male in no acute distress.  He is alert awake and orientated.  Does have a little bit of a left antalgic gait.  He still has a little tenderness around his trochanteric region but again on my exam today is much better than it was the last time.  He is getting good range of motion of his hip although internal rotation does cause him some pain.  X-rays: X-rays of his hips reviewed again and do show some early osteoarthritis of his hip.  He does have a radiopacity in his intertrochanteric region which is well circumscribed.  MRI: His MRI is reviewed and does show some osteoarthritis of his hip as well as some labral degenerative tears.  He has also got some bursitis around his hip.  The mass in the intratracheal [place] region is not considered malignant and they think it is probably a bone island.  Plan: At this stage I think Nathan [name] is actually doing very well and I do not think he really needs any further treatment.  I have told him to come back and see us  if it does get worse but otherwise I would leave it well alone.

## 2024-04-19 DIAGNOSIS — R233 Spontaneous ecchymoses: Secondary | ICD-10-CM | POA: Diagnosis not present

## 2024-04-19 DIAGNOSIS — L300 Nummular dermatitis: Secondary | ICD-10-CM | POA: Diagnosis not present

## 2024-04-19 DIAGNOSIS — L821 Other seborrheic keratosis: Secondary | ICD-10-CM | POA: Diagnosis not present

## 2024-04-27 ENCOUNTER — Other Ambulatory Visit: Payer: Self-pay | Admitting: Family Medicine

## 2024-04-27 DIAGNOSIS — I10 Essential (primary) hypertension: Secondary | ICD-10-CM

## 2024-04-27 NOTE — Progress Notes (Signed)
 04/27/2024 - addendum Reviewed chart. Patient needs updated Rx for amlodipine  and lisinopril  - sent request to PCP with request to clarify when follow up is recommended.   Madelin Ray, PharmD Clinical Pharmacist [facility] Primary Care  Population Health [phone]

## 2024-04-27 NOTE — Progress Notes (Signed)
 Pharmacy Quality Measure Review  This patient is appearing on a report for being at risk of failing the adherence measure for hypertension (ACEi/ARB) medications this calendar year.   Medication: Lisinopril  10mg  Last fill date: 06/03 for 90 day supply  Patient likely out of refills, request submitted on 09/02 was denied for unknown reasons (not documented). AWV scheduled for tomorrow. Will route documentation from today for clinic PharmD to follow-up and order more refills if appropriate based on review.  Angela Baalmann, PharmD [facility] Pharmacist

## 2024-04-27 NOTE — Addendum Note (Signed)
 Addended by: CARLA MILLING B on: 04/27/2024 12:29 PM   Modules accepted: Orders

## 2024-04-28 ENCOUNTER — Other Ambulatory Visit: Payer: Self-pay | Admitting: Family Medicine

## 2024-04-28 ENCOUNTER — Ambulatory Visit

## 2024-04-28 MED ORDER — LISINOPRIL 10 MG PO TABS: 10.0000 mg | ORAL_TABLET | Freq: Every day | ORAL | 0 refills | Status: DC

## 2024-04-28 MED ORDER — AMLODIPINE BESYLATE 10 MG PO TABS: 10.0000 mg | ORAL_TABLET | Freq: Every day | ORAL | 0 refills | Status: DC

## 2024-04-28 NOTE — Addendum Note (Signed)
 Addended by: Violet Cart A on: 04/28/2024 12:34 PM   Modules accepted: Orders

## 2024-04-28 NOTE — Telephone Encounter (Signed)
 Received message from [facility]  Pharmacist requesting new prescriptions to be sent for patient.  I refilled the prescriptions for 30 days and sent to his pharmacy. Please call the patient and get him scheduled for his 48-month follow-up appointment which is overdue.    Return in about 24 weeks (around 03/01/2024) for Routine chronic condition follow-up

## 2024-05-07 NOTE — Telephone Encounter (Signed)
 05/07/2024 - Addendum Looks like Dr Catherine sent in updated Rx for both blood pressure meds for 30 days and requested he follow up with her. It don't see that appointment has been set yet. Called [facility] and prescription for lisinopril  and amlodipine  were put on profile. He should be due to refill both.   Tried to call patient to discuss adherence with blood pressure meds but unable to reach him. LM on VM.  Reminded him that he should contact office at [phone] to make a follow up appointment with Dr Catherine.

## 2024-06-11 ENCOUNTER — Encounter: Payer: Self-pay | Admitting: Pharmacist

## 2024-06-11 NOTE — Progress Notes (Signed)
 Pharmacy Quality Measure Review  This patient is appearing on a report for being at risk of failing the adherence measure for hypertension (ACEi/ARB) medications this calendar year.   Medication: lisinopril  and amlodipine   Last fill date: 12/16/2023 for 90 day supply.   Dr Catherine did sent in Rx for 30 DS for both blood pressure meds in October. Spoke with [facility] pharmacist, Norleen. They did fill lisinopril  and amlodipine  in October but patient did not pick up.    Left voicemail for patient and seny MyChart message to patient to let him know there are prescriptions for amlodipine  and lisinopril  at his pharmacy for 30 day supply. Recommended he contact Dr Buena office to make a follow up appointment within the next 30 days if possible.  Madelin Ray, PharmD Clinical Pharmacist [facility] Primary Care  Population Health [phone]

## 2024-06-18 ENCOUNTER — Ambulatory Visit: Admitting: Family Medicine

## 2024-06-18 ENCOUNTER — Encounter: Payer: Self-pay | Admitting: Family Medicine

## 2024-06-18 VITALS — BP 142/88 | HR 69 | Temp 98.2°F | Wt 190.0 lb

## 2024-06-18 DIAGNOSIS — I700 Atherosclerosis of aorta: Secondary | ICD-10-CM

## 2024-06-18 DIAGNOSIS — Z531 Procedure and treatment not carried out because of patient's decision for reasons of belief and group pressure: Secondary | ICD-10-CM

## 2024-06-18 DIAGNOSIS — G629 Polyneuropathy, unspecified: Secondary | ICD-10-CM | POA: Insufficient documentation

## 2024-06-18 DIAGNOSIS — I10 Essential (primary) hypertension: Secondary | ICD-10-CM

## 2024-06-18 DIAGNOSIS — E663 Overweight: Secondary | ICD-10-CM | POA: Diagnosis not present

## 2024-06-18 DIAGNOSIS — I240 Acute coronary thrombosis not resulting in myocardial infarction: Secondary | ICD-10-CM

## 2024-06-18 DIAGNOSIS — E782 Mixed hyperlipidemia: Secondary | ICD-10-CM

## 2024-06-18 DIAGNOSIS — I6521 Occlusion and stenosis of right carotid artery: Secondary | ICD-10-CM

## 2024-06-18 MED ORDER — LISINOPRIL 10 MG PO TABS: 10.0000 mg | ORAL_TABLET | Freq: Every day | ORAL | 1 refills | Status: AC

## 2024-06-18 MED ORDER — AMLODIPINE BESYLATE 10 MG PO TABS: 10.0000 mg | ORAL_TABLET | Freq: Every day | ORAL | 1 refills | Status: AC

## 2024-06-18 NOTE — Patient Instructions (Addendum)
 Please always have your blood pressure medication in your system at least 2 hours before your physicals and hypertension follow up appts. This is needed for documentation and to ensure adequate control of blood pressure on your medications.   Thanks.    Return in about 2 weeks (around 07/02/2024) for nurse visit for BP recheck on meds.  And CPE/CMC in April 2026      Great to see you today.  I have refilled the medication(s) we provide.   If labs were collected or images ordered, we will inform you of  results once we have received them and reviewed. We will contact you either by echart message, or telephone call.  Please give ample time to the testing facility, and our office to run,  receive and review results. Please do not call inquiring of results, even if you can see them in your chart. We will contact you as soon as we are able. If it has been over 1 week since the test was completed, and you have not yet heard from us , then please call us .    - echart message- for normal results that have been seen by the patient already.   - telephone call: abnormal results or if patient has not viewed results in their echart.  If a referral to a specialist was entered for you, please call us  in 2 weeks if you have not heard from the specialist office to schedule.

## 2024-06-18 NOTE — Progress Notes (Signed)
 Patient ID: Nathan [name], male  DOB: Jun 16, 1946, 78 y.o.   MRN: [id number] Patient Care Team    Relationship Specialty Notifications Start End  Catherine Charlies LABOR, DO PCP - General Family Medicine  12/07/15   Pa, [facility]    06/02/17   Jerri Kay HERO, MD Attending Physician Orthopedic Surgery  06/02/17   Cloretta Arley NOVAK, MD Consulting Physician Oncology  05/29/20   Sheldon Standing, MD Consulting Physician General Surgery  06/19/20   Monetta Redell PARAS, MD Consulting Physician Cardiology  06/19/20   Armbruster, Standing SQUIBB, MD Consulting Physician Gastroenterology  06/26/20   Alvia Olam BIRCH, RN [facility]   03/22/22   Magda Debby SAILOR, MD Consulting Physician Vascular Surgery  09/15/23   Lavona Agent, MD Consulting Physician Cardiology  09/15/23     Chief Complaint  Patient presents with   Hypertension    Subjective: Nathan [name] is a 78 y.o. male present for chronic Conditions/illness Management Medication reconciliation completed today Past medical history updated if appropriate.   Hypertension/atheroscolerosis of aorta/statin declined: Pt reports compliance with amlodipine  10 mg daily and Lisinopril  10 mg every day- BUT he did not take his meds today bc he wanted to see what his pressure was without med. Patient denies chest pain, shortness of breath, dizziness or lower extremity edema.    Patient follows mostly a plant-based diet with a small amount of lean meat and weekly refused statin medication, or other cholesterol medications.  He has been referred to cardiology in the past. Exercise: He exercises routinely. Using stationary bike 6 times a week for approximately 20-30 minutes. RF: hypertension, hyperlipidemia, pulm former smoker, fhx heart disease, BMI>25   Patient mentions about neuropathy signs in his bilateral feet, on the bottom of his feet and his right arm.  He states it has been present a little over 6 months and is  constant.  He refrains from eating meat except for 1 day a week he has chicken.  He used to take B12/B complex vitamins, but reports he has not been taking them lately.  Patient has a CV risk of approximately 30% CV event in the next 10 years. RF: hypertension, hyperlipidemia, former smoker, fhx heart disease, BMI>25 Patient has a history of right carotid artery stenosis. 01/2022: Coronary Calcium  score FINDINGS: Coronary arteries: Normal origins. Coronary Calcium  Score: Left main: 2 Left anterior descending artery: 62 Left circumflex artery: 8 Right coronary artery: 16 Total: 88 Percentile: 29 Pericardium: Normal. Aorta: Normal caliber of ascending aorta. Aortic atherosclerosis noted. Non-cardiac: See separate report from [facility]. IMPRESSION: Coronary calcium  score of 88. This was 29th percentile for age-, race-, and sex-matched controls.    Carotid duplex 01/2022:  IMPRESSION: Right: Heterogeneous and partially calcified plaque at the right carotid bifurcation contributes to 50%-69% stenosis by established duplex criteria. Left: Color duplex indicates moderate heterogeneous and calcified plaque, with no hemodynamically significant stenosis by duplex criteria in the extracranial cerebrovascular circulation.    01/26/2019 carotid vascular ultrasound: IMPRESSION: 1. Moderate (50-69%) stenosis proximal right internal carotid artery secondary to heterogeneous and mildly irregular atherosclerotic plaque. 2. Mild (1-49%) stenosis proximal left internal carotid artery secondary to heterogeneous and slightly irregular atherosclerotic plaque. 3. Flow reversal in the left vertebral artery consistent with subclavian steal in the setting of a hemodynamically significant stenosis or occlusion of the proximal subclavian artery. 4. Normal antegrade flow in the right vertebral artery  Echo 02/11/2019:  1. The left ventricle has normal systolic  function, with an ejection   fraction of 60-65%. The cavity size was normal. There is mildly increased  left ventricular wall thickness. Left ventricular diastolic parameters  were normal.   2. The right ventricle has normal systolc function. The cavity was mildly  enlarged. There is no increase in right ventricular wall thickness. Right  ventricular systolic pressure is normal with an estimated pressure of 28.0  mmHg.   3. Aortic valve regurgitation was not assessed by color flow Doppler.   4. Pulmonic valve regurgitation was not assessed by color flow Doppler.   02/16/2019-myocardial perfusion scan: The left ventricular ejection fraction is normal (55-65%). Nuclear stress EF: 55%. There was no ST segment deviation noted during stress. Defect 1: There is a small defect of mild severity present in the apex location. The study is normal. This is a low risk study.   Normal stress nuclear study with mild apical thinning but no ischemia.  Gated ejection fraction 55% with normal wall motion.    09/15/2023   10:01 AM 12/24/2022    9:44 AM 05/22/2022   10:31 AM 03/22/2022    2:48 PM 10/24/2021    8:20 AM  Depression screen PHQ 2/9  Decreased Interest 0 1 1 0 0  Down, Depressed, Hopeless 0 0  0 1  PHQ - 2 Score 0 1 1 0 1  Altered sleeping  0 3    Tired, decreased energy  1 3    Change in appetite  2 0    Feeling bad or failure about yourself   0 0    Trouble concentrating  0 0    Moving slowly or fidgety/restless  0 0    Suicidal thoughts  0 0    PHQ-9 Score  4  7     Difficult doing work/chores  Somewhat difficult        Data saved with a previous flowsheet row definition      12/24/2022    9:44 AM 05/22/2022   10:31 AM 08/24/2015    2:57 PM  GAD 7 : Generalized Anxiety Score  Nervous, Anxious, on Edge 0 2 2  Control/stop worrying 0 2 3  Worry too much - different things 0 2 3  Trouble relaxing 0 1 1  Restless  0 0  Easily annoyed or irritable 0 2 2  Afraid - awful might happen 0 1 0  Total GAD 7 Score  10  11  Anxiety Difficulty Not difficult at all  Somewhat difficult        09/15/2023   10:01 AM 12/24/2022    9:43 AM 10/24/2021    8:23 AM 04/12/2021    8:30 AM 09/20/2020    9:11 AM  Fall Risk   Falls in the past year? 0 0 0 0 1  Number falls in past yr: 0 0 0 0 0  Injury with Fall? 0  0  0  0  0   Risk for fall due to : No Fall Risks No Fall Risks Impaired balance/gait  History of fall(s)  Risk for fall due to: Comment   not like it used to be    Follow up Falls evaluation completed Falls evaluation completed Falls prevention discussed  Falls evaluation completed  Falls prevention discussed      Data saved with a previous flowsheet row definition   Immunization History  Administered Date(s) Administered   PFIZER(Purple Top)SARS-COV-2 Vaccination 09/20/2019, 10/20/2019, 07/03/2020   PPD Test 03/17/2013   Tdap 07/15/2012  Zoster, Live 02/06/2015   Past Medical History:  Diagnosis Date   Adrenal mass 2018   Urology fully evaluated; benign   Allergy    hay fever   Anemia    resolved spontaneously   Anxiety    Cataract    Cervicalgia    Complication of anesthesia    hypoglycemia, low blood pressure, pt. reports it dropped to zero- post op MORPHINE  , then taken ICU due to either the morphine  or hypoglycemia . In addition post op from cataract surgery- 07/2015, experienced an episode of hypoglycemia     Depressive disorder, not elsewhere classified    Diverticulosis    Elevated prostate specific antigen (PSA) 2008   Dr Watt   Fecal occult blood test positive 02/07/2020   Gallstone    GERD (gastroesophageal reflux disease)    H/O cardiovascular stress test >10 yrs.   told that its wnl. Told to drink less coffee   Hematuria 2018   Hematuria with enlarged prostate, kidney cyst, adrenal adenoma--> followed by urology with cystoscopy and repeat CT, benign workup.   Hx of migraines    Hypertension    Hyperthyroidism    Treated with medication no longer needed at this time    Hypoglycemia    IBS (irritable bowel syndrome)    patient not aware   Inguinal hernia 12/2015   Bilateral small indirect hernias on CT, umbilical hernia also present.   Kidney cyst, acquired 2018   Followed by urology, benign.   Low back pain    Macular degeneration (senile) of retina, unspecified    2 different opinions from 2 different Ophth   Osteoarthrosis, unspecified whether generalized or localized, unspecified site    Other and unspecified hyperlipidemia    Prostatitis 2008   Rectal cancer (HCC)    Refusal of blood transfusions as patient is Jehovah's Witness    Segmental and somatic dysfunction of cervical region    Segmental and somatic dysfunction of lumbar region    Segmental and somatic dysfunction of sacral region    Segmental and somatic dysfunction of thoracic region    Spondylosis of lumbar spine    Strain of pelvis    Tuberculosis    had exposure as child tests positive on skin test.   Allergies  Allergen Reactions   Morphine  And Codeine  Other (See Comments)    Severe drop in blood pressure   Sertraline Hcl Other (See Comments)    Headache   Past Surgical History:  Procedure Laterality Date   BIOPSY  05/24/2020   Procedure: BIOPSY;  Surgeon: Wilhelmenia Aloha Raddle., MD;  Location[facility] ENDOSCOPY;  Service: Gastroenterology;;   CATARACT EXTRACTION, BILATERAL     COLONOSCOPY  2012   Dr Obie   COLONOSCOPY WITH PROPOFOL  N/A 05/24/2020   Procedure: COLONOSCOPY WITH PROPOFOL ;  Surgeon: Wilhelmenia Aloha Raddle., MD;  Location: [facility] ENDOSCOPY;  Service: Gastroenterology;  Laterality: N/A;   CYSTOSCOPY     for hematuria   EUS N/A 05/24/2020   Procedure: LOWER ENDOSCOPIC ULTRASOUND (EUS);  Surgeon: Wilhelmenia Aloha Raddle., MD;  Location: [facility] ENDOSCOPY;  Service: Gastroenterology;  Laterality: N/A;   hydrocoelectomy  2003   Dr Watt   KNEE ARTHROSCOPY Left 1994   OSTOMY N/A 06/21/2020   Procedure: POSSIBLE OSTOMY;  Surgeon: Sheldon Standing, MD;  Location: [facility] ORS;   Service: General;  Laterality: N/A;   POLYPECTOMY  05/24/2020   Procedure: POLYPECTOMY;  Surgeon: Wilhelmenia Aloha Raddle., MD;  Location: [facility] ENDOSCOPY;  Service: Gastroenterology;;   PROCTOSCOPY N/A  06/21/2020   Procedure: RIGID PROCTOSCOPY;  Surgeon: Sheldon Standing, MD;  Location: [facility] ORS;  Service: General;  Laterality: N/A;   prostate nodule resection  2003   TONSILLECTOMY     TOTAL KNEE ARTHROPLASTY  2009   TOTAL KNEE ARTHROPLASTY Right 06/27/2016   Procedure: TOTAL KNEE ARTHROPLASTY;  Surgeon: Kay CHRISTELLA Cummins, MD;  Location: [facility] OR;  Service: Orthopedics;  Laterality: Right;   XI ROBOTIC ASSISTED LOWER ANTERIOR RESECTION N/A 06/21/2020   Procedure: XI ROBOTIC ASSISTED LOWER ANTERIOR RECTOSIGMOID RESECTION, BILATERAL TAP BLOCK;  Surgeon: Sheldon Standing, MD;  Location: [facility] ORS;  Service: General;  Laterality: N/A;   Family History  Problem Relation Age of Onset   Dementia Mother    Lung cancer Father        smoker   Alcohol abuse Father    Tuberculosis Father    Early death Father    Heart attack Brother 4   Diabetes Other    Stroke Neg Hx    Thyroid  disease Neg Hx    Colon cancer Neg Hx    Stomach cancer Neg Hx    Rectal cancer Neg Hx    Esophageal cancer Neg Hx    Inflammatory bowel disease Neg Hx    Liver disease Neg Hx    Pancreatic cancer Neg Hx    Colon polyps Neg Hx    Social History   Social History Narrative   Married. Wife's name is Darice. Part time employed, psychologist, counselling.   Drinks caffeinated beverages. Take a multivitamin. Wears his seatbelt.   Exercises at least 3 times a week. Is not a strict vegetarian, but only eats meat 1-2 times a week   Wears a hearing aid. Does not wear dentures or use an assistive walking device.   There is a smoke detector in his home.   He feels safe in his relationships.    Allergies as of 06/18/2024       Reactions   Morphine  And Codeine  Other (See Comments)   Severe drop in blood pressure   Sertraline Hcl Other (See  Comments)   Headache        Medication List        Accurate as of June 18, 2024  9:29 AM. If you have any questions, ask your nurse or doctor.          amLODipine  10 MG tablet Commonly known as: NORVASC  Take 1 tablet (10 mg total) by mouth daily.   ascorbic acid 500 MG tablet Commonly known as: VITAMIN C Take 1,000 mg by mouth 2 (two) times daily.   b complex vitamins tablet Take 1 tablet by mouth every other day.   B-12 SL Place under the tongue.   D3 PO Take by mouth.   K2 PO Take by mouth.   KRILL OIL PO Take by mouth.   lisinopril  10 MG tablet Commonly known as: ZESTRIL  Take 1 tablet (10 mg total) by mouth daily.       All past medical history, surgical history, allergies, family history, immunizations andmedications were updated in the EMR today and reviewed under the history and medication portions of their EMR.     No results found for this or any previous visit (from the past 2160 hours).   No results found.  ROS: 14 pt review of systems performed and negative (unless mentioned in an HPI)  Objective: BP (!) 142/88   Pulse 69   Temp 98.2 F (36.8 C)   Wt 190 lb (  86.2 kg)   SpO2 97%   BMI 28.26 kg/m  Physical Exam Vitals and nursing note reviewed. Exam conducted with a chaperone present.  Constitutional:      General: He is not in acute distress.    Appearance: Normal appearance. He is not ill-appearing, toxic-appearing or diaphoretic.  HENT:     Head: Normocephalic and atraumatic.  Eyes:     General: No scleral icterus.       Right eye: No discharge.        Left eye: No discharge.     Extraocular Movements: Extraocular movements intact.     Pupils: Pupils are equal, round, and reactive to light.  Cardiovascular:     Rate and Rhythm: Normal rate and regular rhythm.     Heart sounds: No murmur heard. Pulmonary:     Effort: Pulmonary effort is normal. No respiratory distress.     Breath sounds: Normal breath sounds. No wheezing,  rhonchi or rales.  Musculoskeletal:     Right lower leg: No edema.     Left lower leg: No edema.  Skin:    General: Skin is warm.     Findings: No rash.  Neurological:     Mental Status: He is alert and oriented to person, place, and time. Mental status is at baseline.  Psychiatric:        Mood and Affect: Mood normal.        Behavior: Behavior normal.        Thought Content: Thought content normal.        Judgment: Judgment normal.     No results found.  Assessment/plan: Nathan [name] is a 78 y.o. male present for Chronic Conditions/illness Management Essential hypertension, benign/Atherosclerosis of aorta (HCC)/Overweight (BMI 25.0-29.9)/Mixed hyperlipidemia/Statin declined/Atherosclerosis of aorta (HCC) We discussed genetics plays a role in cardiovascular health.  It is not uncommon to eat healthy, exercise and yet require blood pressure medications and cholesterol medications to maintain cardiac health as we age.   Recommend he continue his dietary changes with a heart healthy diet.  Continue to exercise with the benchmark of 150 minutes/week. Continue amlodipine  10 mg qd Continue lisinopril  at 10 mg qd. CV risk 30%> patient declines to start up all classes of cholesterol medications - CT CARDIAC(01/2022):29th % Labs up-to-date Return in 2 weeks for BP recheck on medications, if still elevated, consider increasing lisinopril  to 20 mg which we discussed today  Stenosis of right carotid artery - US  Carotid Duplex Bilateral;(10/2022)> stable Now est with cardio and vasc.   Neuropathy: We discussed different causes for neuropathy and workup it would require.  He would like to wait for now and just restart his B vitamins.  He reports if the neuropathy becomes worse or does not improve, he will make a separate appointment to discuss and to start workup (CBC, CMP, sed rate, ANA, SPEP, UPEP)  Return in about 2 weeks (around 07/02/2024) for nurse visit for BP recheck on meds.   No  orders of the defined types were placed in this encounter.  Meds ordered this encounter  Medications   amLODipine  (NORVASC ) 10 MG tablet    Sig: Take 1 tablet (10 mg total) by mouth daily.    Dispense:  90 tablet    Refill:  1   lisinopril  (ZESTRIL ) 10 MG tablet    Sig: Take 1 tablet (10 mg total) by mouth daily.    Dispense:  90 tablet    Refill:  1   Referral Orders  No referral(s) requested today     Note is dictated utilizing voice recognition software. Although note has been proof read prior to signing, occasional typographical errors still can be missed. If any questions arise, please do not hesitate to call for verification.  Electronically signed by: Charlies Bellini, DO [facility]

## 2024-07-02 ENCOUNTER — Ambulatory Visit

## 2024-07-02 NOTE — Progress Notes (Signed)
 Pt here for Blood pressure check  Pt currently takes: Amlodipine  10mg   Pt reports compliance with medication.  BP today was 132/78

## 2024-08-04 ENCOUNTER — Encounter: Payer: Self-pay | Admitting: Gastroenterology
